# Patient Record
Sex: Female | Born: 1935 | Race: White | Hispanic: No | Smoking: Former smoker
Health system: Southern US, Community
[De-identification: ages and names within clinical notes are randomized; demographics above are authoritative.]

## PROBLEM LIST (undated history)

## (undated) VITALS — BP 105/41 | HR 64 | Temp 97.3°F | Resp 16 | Ht 61.0 in | Wt 144.4 lb

## (undated) DIAGNOSIS — R0902 Hypoxemia: Secondary | ICD-10-CM

## (undated) DIAGNOSIS — I70202 Unspecified atherosclerosis of native arteries of extremities, left leg: Secondary | ICD-10-CM

## (undated) DIAGNOSIS — N183 Chronic kidney disease, stage 3 unspecified: Secondary | ICD-10-CM

## (undated) DIAGNOSIS — I2510 Atherosclerotic heart disease of native coronary artery without angina pectoris: Secondary | ICD-10-CM

## (undated) DIAGNOSIS — N390 Urinary tract infection, site not specified: Secondary | ICD-10-CM

## (undated) DIAGNOSIS — I10 Essential (primary) hypertension: Secondary | ICD-10-CM

## (undated) DIAGNOSIS — J449 Chronic obstructive pulmonary disease, unspecified: Secondary | ICD-10-CM

## (undated) DIAGNOSIS — I739 Peripheral vascular disease, unspecified: Secondary | ICD-10-CM

## (undated) DIAGNOSIS — E785 Hyperlipidemia, unspecified: Secondary | ICD-10-CM

## (undated) DIAGNOSIS — I5022 Chronic systolic (congestive) heart failure: Secondary | ICD-10-CM

## (undated) DIAGNOSIS — B379 Candidiasis, unspecified: Secondary | ICD-10-CM

## (undated) DIAGNOSIS — I480 Paroxysmal atrial fibrillation: Secondary | ICD-10-CM

## (undated) DIAGNOSIS — E119 Type 2 diabetes mellitus without complications: Secondary | ICD-10-CM

## (undated) DIAGNOSIS — I219 Acute myocardial infarction, unspecified: Secondary | ICD-10-CM

## (undated) HISTORY — DX: Acute myocardial infarction, unspecified: I21.9

## (undated) HISTORY — PX: CORONARY ARTERY BYPASS GRAFT: SHX141

## (undated) HISTORY — PX: OTHER SURGICAL HISTORY: SHX169

## (undated) HISTORY — DX: Peripheral vascular disease, unspecified: I73.9

## (undated) HISTORY — PX: TOTAL KNEE ARTHROPLASTY: SHX125

## (undated) HISTORY — PX: CHOLECYSTECTOMY: SHX55

## (undated) HISTORY — DX: Paroxysmal atrial fibrillation: I48.0

## (undated) HISTORY — DX: Candidiasis, unspecified: B37.9

## (undated) HISTORY — PX: ANGIOPLASTY / STENTING FEMORAL: SUR30

## (undated) HISTORY — PX: CARDIAC CATHETERIZATION: SHX172

## (undated) HISTORY — DX: Chronic obstructive pulmonary disease, unspecified: J44.9

## (undated) HISTORY — PX: FEMUR SURGERY: SHX943

## (undated) HISTORY — PX: APPENDECTOMY: SHX54

## (undated) HISTORY — DX: Urinary tract infection, site not specified: N39.0

## (undated) HISTORY — PX: OVARY SURGERY: SHX727

## (undated) HISTORY — DX: Hyperlipidemia, unspecified: E78.5

## (undated) HISTORY — DX: Type 2 diabetes mellitus without complications: E11.9

## (undated) HISTORY — DX: Chronic systolic (congestive) heart failure: I50.22

## (undated) HISTORY — PX: ABDOMINAL HYSTERECTOMY: SHX81

---

## 1997-07-07 ENCOUNTER — Inpatient Hospital Stay (HOSPITAL_COMMUNITY): Admission: EM | Admit: 1997-07-07 | Discharge: 1997-07-15 | Payer: Self-pay | Admitting: Cardiovascular Disease

## 1997-08-17 ENCOUNTER — Encounter: Admission: RE | Admit: 1997-08-17 | Discharge: 1997-11-15 | Payer: Self-pay | Admitting: Anesthesiology

## 1997-09-26 ENCOUNTER — Emergency Department (HOSPITAL_COMMUNITY): Admission: EM | Admit: 1997-09-26 | Discharge: 1997-09-26 | Payer: Self-pay | Admitting: Emergency Medicine

## 1997-12-11 ENCOUNTER — Encounter: Payer: Self-pay | Admitting: Cardiovascular Disease

## 1997-12-11 ENCOUNTER — Observation Stay (HOSPITAL_COMMUNITY): Admission: RE | Admit: 1997-12-11 | Discharge: 1997-12-12 | Payer: Self-pay | Admitting: Cardiovascular Disease

## 1998-05-10 ENCOUNTER — Encounter: Admission: RE | Admit: 1998-05-10 | Discharge: 1998-08-08 | Payer: Self-pay | Admitting: Anesthesiology

## 1998-08-06 ENCOUNTER — Encounter: Payer: Self-pay | Admitting: Cardiovascular Disease

## 1998-08-06 ENCOUNTER — Ambulatory Visit: Admission: RE | Admit: 1998-08-06 | Discharge: 1998-08-06 | Payer: Self-pay | Admitting: Cardiovascular Disease

## 1998-09-04 ENCOUNTER — Encounter (INDEPENDENT_AMBULATORY_CARE_PROVIDER_SITE_OTHER): Payer: Self-pay | Admitting: Specialist

## 1998-09-05 ENCOUNTER — Inpatient Hospital Stay (HOSPITAL_COMMUNITY): Admission: EM | Admit: 1998-09-05 | Discharge: 1998-09-07 | Payer: Self-pay | Admitting: Cardiovascular Disease

## 1998-11-19 ENCOUNTER — Encounter: Admission: RE | Admit: 1998-11-19 | Discharge: 1999-02-17 | Payer: Self-pay | Admitting: Anesthesiology

## 1999-03-28 ENCOUNTER — Ambulatory Visit (HOSPITAL_COMMUNITY): Admission: RE | Admit: 1999-03-28 | Discharge: 1999-03-29 | Payer: Self-pay | Admitting: Cardiovascular Disease

## 1999-03-28 ENCOUNTER — Encounter: Payer: Self-pay | Admitting: Cardiovascular Disease

## 1999-04-09 ENCOUNTER — Inpatient Hospital Stay (HOSPITAL_COMMUNITY): Admission: EM | Admit: 1999-04-09 | Discharge: 1999-04-13 | Payer: Self-pay | Admitting: Emergency Medicine

## 1999-04-09 ENCOUNTER — Encounter: Payer: Self-pay | Admitting: Emergency Medicine

## 1999-04-12 ENCOUNTER — Encounter: Payer: Self-pay | Admitting: Cardiovascular Disease

## 1999-05-02 ENCOUNTER — Observation Stay (HOSPITAL_COMMUNITY): Admission: RE | Admit: 1999-05-02 | Discharge: 1999-05-03 | Payer: Self-pay | Admitting: Cardiovascular Disease

## 1999-06-14 ENCOUNTER — Encounter: Admission: RE | Admit: 1999-06-14 | Discharge: 1999-09-12 | Payer: Self-pay | Admitting: Anesthesiology

## 1999-12-27 ENCOUNTER — Encounter: Admission: RE | Admit: 1999-12-27 | Discharge: 2000-03-26 | Payer: Self-pay | Admitting: Anesthesiology

## 2000-04-02 ENCOUNTER — Encounter: Admission: RE | Admit: 2000-04-02 | Discharge: 2000-07-01 | Payer: Self-pay | Admitting: Anesthesiology

## 2000-05-16 ENCOUNTER — Ambulatory Visit (HOSPITAL_COMMUNITY): Admission: RE | Admit: 2000-05-16 | Discharge: 2000-05-16 | Payer: Self-pay

## 2000-06-25 ENCOUNTER — Encounter: Payer: Self-pay | Admitting: Specialist

## 2000-06-25 ENCOUNTER — Encounter: Admission: RE | Admit: 2000-06-25 | Discharge: 2000-06-25 | Payer: Self-pay | Admitting: Specialist

## 2000-09-07 ENCOUNTER — Encounter: Admission: RE | Admit: 2000-09-07 | Discharge: 2000-10-17 | Payer: Self-pay | Admitting: Anesthesiology

## 2001-11-25 ENCOUNTER — Inpatient Hospital Stay (HOSPITAL_COMMUNITY): Admission: EM | Admit: 2001-11-25 | Discharge: 2001-11-28 | Payer: Self-pay | Admitting: Emergency Medicine

## 2001-11-25 ENCOUNTER — Encounter: Payer: Self-pay | Admitting: Cardiovascular Disease

## 2001-11-25 ENCOUNTER — Encounter: Payer: Self-pay | Admitting: Emergency Medicine

## 2002-12-01 ENCOUNTER — Encounter: Payer: Self-pay | Admitting: Orthopedic Surgery

## 2002-12-07 ENCOUNTER — Inpatient Hospital Stay (HOSPITAL_COMMUNITY): Admission: RE | Admit: 2002-12-07 | Discharge: 2002-12-19 | Payer: Self-pay | Admitting: Orthopedic Surgery

## 2002-12-07 ENCOUNTER — Encounter: Payer: Self-pay | Admitting: Orthopedic Surgery

## 2002-12-10 ENCOUNTER — Encounter: Payer: Self-pay | Admitting: Orthopedic Surgery

## 2003-12-25 ENCOUNTER — Ambulatory Visit: Payer: Self-pay

## 2003-12-26 ENCOUNTER — Emergency Department: Payer: Self-pay | Admitting: Emergency Medicine

## 2004-01-02 ENCOUNTER — Ambulatory Visit: Payer: Self-pay | Admitting: General Surgery

## 2004-01-05 ENCOUNTER — Ambulatory Visit: Payer: Self-pay | Admitting: General Surgery

## 2004-02-05 ENCOUNTER — Ambulatory Visit: Payer: Self-pay | Admitting: Otolaryngology

## 2004-02-05 ENCOUNTER — Other Ambulatory Visit: Payer: Self-pay

## 2004-02-13 ENCOUNTER — Ambulatory Visit: Payer: Self-pay | Admitting: Otolaryngology

## 2004-02-23 ENCOUNTER — Ambulatory Visit: Payer: Self-pay | Admitting: Internal Medicine

## 2004-03-20 ENCOUNTER — Ambulatory Visit: Payer: Self-pay | Admitting: Internal Medicine

## 2004-04-17 ENCOUNTER — Ambulatory Visit: Payer: Self-pay | Admitting: Internal Medicine

## 2004-05-18 ENCOUNTER — Ambulatory Visit: Payer: Self-pay | Admitting: Internal Medicine

## 2004-08-22 ENCOUNTER — Ambulatory Visit: Payer: Self-pay | Admitting: Internal Medicine

## 2004-09-14 ENCOUNTER — Other Ambulatory Visit: Payer: Self-pay

## 2004-09-14 ENCOUNTER — Emergency Department: Payer: Self-pay | Admitting: Emergency Medicine

## 2004-09-17 ENCOUNTER — Ambulatory Visit: Payer: Self-pay | Admitting: Internal Medicine

## 2004-12-23 ENCOUNTER — Ambulatory Visit: Payer: Self-pay | Admitting: Internal Medicine

## 2004-12-31 ENCOUNTER — Ambulatory Visit: Payer: Self-pay

## 2005-01-17 ENCOUNTER — Ambulatory Visit: Payer: Self-pay | Admitting: Internal Medicine

## 2005-02-17 ENCOUNTER — Ambulatory Visit: Payer: Self-pay | Admitting: Internal Medicine

## 2005-05-21 ENCOUNTER — Ambulatory Visit: Payer: Self-pay | Admitting: Internal Medicine

## 2005-06-17 ENCOUNTER — Ambulatory Visit: Payer: Self-pay | Admitting: Internal Medicine

## 2005-07-29 ENCOUNTER — Ambulatory Visit: Payer: Self-pay | Admitting: Internal Medicine

## 2005-08-17 ENCOUNTER — Ambulatory Visit: Payer: Self-pay | Admitting: Internal Medicine

## 2005-10-26 ENCOUNTER — Emergency Department: Payer: Self-pay | Admitting: Internal Medicine

## 2005-11-17 ENCOUNTER — Ambulatory Visit: Payer: Self-pay | Admitting: Internal Medicine

## 2005-12-05 ENCOUNTER — Inpatient Hospital Stay (HOSPITAL_COMMUNITY): Admission: EM | Admit: 2005-12-05 | Discharge: 2005-12-09 | Payer: Self-pay | Admitting: Cardiology

## 2005-12-05 ENCOUNTER — Emergency Department: Payer: Self-pay | Admitting: Emergency Medicine

## 2005-12-18 ENCOUNTER — Ambulatory Visit: Payer: Self-pay | Admitting: Internal Medicine

## 2006-02-19 ENCOUNTER — Ambulatory Visit (HOSPITAL_COMMUNITY): Admission: RE | Admit: 2006-02-19 | Discharge: 2006-02-19 | Payer: Self-pay | Admitting: Orthopedic Surgery

## 2006-03-26 ENCOUNTER — Ambulatory Visit: Payer: Self-pay

## 2006-05-19 ENCOUNTER — Ambulatory Visit: Payer: Self-pay | Admitting: Internal Medicine

## 2006-06-18 ENCOUNTER — Ambulatory Visit: Payer: Self-pay | Admitting: Internal Medicine

## 2006-06-22 ENCOUNTER — Ambulatory Visit: Payer: Self-pay | Admitting: Internal Medicine

## 2006-07-15 ENCOUNTER — Inpatient Hospital Stay (HOSPITAL_COMMUNITY): Admission: RE | Admit: 2006-07-15 | Discharge: 2006-07-20 | Payer: Self-pay | Admitting: Orthopedic Surgery

## 2006-07-19 ENCOUNTER — Ambulatory Visit: Payer: Self-pay | Admitting: Internal Medicine

## 2006-08-10 ENCOUNTER — Encounter: Payer: Self-pay | Admitting: Orthopedic Surgery

## 2006-08-18 ENCOUNTER — Encounter: Payer: Self-pay | Admitting: Orthopedic Surgery

## 2006-09-18 ENCOUNTER — Encounter: Payer: Self-pay | Admitting: Orthopedic Surgery

## 2006-10-07 ENCOUNTER — Ambulatory Visit (HOSPITAL_BASED_OUTPATIENT_CLINIC_OR_DEPARTMENT_OTHER): Admission: RE | Admit: 2006-10-07 | Discharge: 2006-10-07 | Payer: Self-pay | Admitting: Orthopedic Surgery

## 2006-10-19 ENCOUNTER — Encounter: Payer: Self-pay | Admitting: Orthopedic Surgery

## 2006-11-18 ENCOUNTER — Encounter: Payer: Self-pay | Admitting: Orthopedic Surgery

## 2006-11-18 ENCOUNTER — Ambulatory Visit: Payer: Self-pay | Admitting: Internal Medicine

## 2006-11-23 ENCOUNTER — Ambulatory Visit: Payer: Self-pay | Admitting: Internal Medicine

## 2006-11-24 ENCOUNTER — Emergency Department: Payer: Self-pay | Admitting: Emergency Medicine

## 2006-12-19 ENCOUNTER — Ambulatory Visit: Payer: Self-pay | Admitting: Internal Medicine

## 2007-02-21 ENCOUNTER — Inpatient Hospital Stay (HOSPITAL_COMMUNITY): Admission: EM | Admit: 2007-02-21 | Discharge: 2007-02-24 | Payer: Self-pay | Admitting: Emergency Medicine

## 2007-03-30 ENCOUNTER — Other Ambulatory Visit: Payer: Self-pay

## 2007-03-31 ENCOUNTER — Inpatient Hospital Stay: Payer: Self-pay | Admitting: Internal Medicine

## 2007-04-24 ENCOUNTER — Emergency Department (HOSPITAL_COMMUNITY): Admission: EM | Admit: 2007-04-24 | Discharge: 2007-04-24 | Payer: Self-pay | Admitting: Emergency Medicine

## 2007-05-19 ENCOUNTER — Ambulatory Visit: Payer: Self-pay | Admitting: Internal Medicine

## 2007-06-18 ENCOUNTER — Ambulatory Visit: Payer: Self-pay | Admitting: Internal Medicine

## 2007-09-08 ENCOUNTER — Inpatient Hospital Stay (HOSPITAL_COMMUNITY): Admission: EM | Admit: 2007-09-08 | Discharge: 2007-09-14 | Payer: Self-pay | Admitting: Emergency Medicine

## 2007-09-18 ENCOUNTER — Ambulatory Visit: Payer: Self-pay | Admitting: Internal Medicine

## 2007-10-05 ENCOUNTER — Ambulatory Visit: Payer: Self-pay | Admitting: Internal Medicine

## 2007-10-13 ENCOUNTER — Inpatient Hospital Stay (HOSPITAL_COMMUNITY): Admission: EM | Admit: 2007-10-13 | Discharge: 2007-10-16 | Payer: Self-pay | Admitting: Emergency Medicine

## 2007-10-19 ENCOUNTER — Ambulatory Visit: Payer: Self-pay | Admitting: Internal Medicine

## 2008-03-20 ENCOUNTER — Ambulatory Visit: Payer: Self-pay | Admitting: Internal Medicine

## 2008-04-03 ENCOUNTER — Ambulatory Visit: Payer: Self-pay | Admitting: Internal Medicine

## 2008-04-05 ENCOUNTER — Inpatient Hospital Stay (HOSPITAL_COMMUNITY): Admission: EM | Admit: 2008-04-05 | Discharge: 2008-04-10 | Payer: Self-pay | Admitting: Emergency Medicine

## 2008-04-17 ENCOUNTER — Ambulatory Visit: Payer: Self-pay | Admitting: Internal Medicine

## 2008-05-11 ENCOUNTER — Ambulatory Visit: Payer: Self-pay

## 2008-05-18 ENCOUNTER — Ambulatory Visit: Payer: Self-pay | Admitting: Internal Medicine

## 2008-06-05 ENCOUNTER — Inpatient Hospital Stay (HOSPITAL_COMMUNITY): Admission: RE | Admit: 2008-06-05 | Discharge: 2008-06-08 | Payer: Self-pay | Admitting: Cardiovascular Disease

## 2008-08-08 ENCOUNTER — Emergency Department: Payer: Self-pay | Admitting: Emergency Medicine

## 2008-08-15 ENCOUNTER — Emergency Department (HOSPITAL_COMMUNITY): Admission: EM | Admit: 2008-08-15 | Discharge: 2008-08-15 | Payer: Self-pay | Admitting: Emergency Medicine

## 2008-11-09 ENCOUNTER — Ambulatory Visit: Payer: Self-pay | Admitting: Vascular Surgery

## 2008-12-02 ENCOUNTER — Ambulatory Visit: Payer: Self-pay | Admitting: Family Medicine

## 2008-12-02 ENCOUNTER — Inpatient Hospital Stay (HOSPITAL_COMMUNITY): Admission: EM | Admit: 2008-12-02 | Discharge: 2008-12-07 | Payer: Self-pay | Admitting: Emergency Medicine

## 2009-03-01 ENCOUNTER — Ambulatory Visit: Payer: Self-pay | Admitting: Vascular Surgery

## 2009-05-21 ENCOUNTER — Encounter: Admission: RE | Admit: 2009-05-21 | Discharge: 2009-05-21 | Payer: Self-pay | Admitting: Cardiovascular Disease

## 2009-05-24 ENCOUNTER — Inpatient Hospital Stay (HOSPITAL_COMMUNITY): Admission: RE | Admit: 2009-05-24 | Discharge: 2009-05-25 | Payer: Self-pay | Admitting: Cardiovascular Disease

## 2009-06-17 ENCOUNTER — Ambulatory Visit: Payer: Self-pay | Admitting: Internal Medicine

## 2009-06-18 ENCOUNTER — Ambulatory Visit: Payer: Self-pay | Admitting: Internal Medicine

## 2009-07-18 ENCOUNTER — Ambulatory Visit: Payer: Self-pay | Admitting: Internal Medicine

## 2009-07-29 ENCOUNTER — Emergency Department: Payer: Self-pay | Admitting: Emergency Medicine

## 2010-01-14 ENCOUNTER — Encounter: Admission: RE | Admit: 2010-01-14 | Discharge: 2010-01-14 | Payer: Self-pay | Admitting: Cardiovascular Disease

## 2010-01-22 ENCOUNTER — Ambulatory Visit (HOSPITAL_COMMUNITY)
Admission: RE | Admit: 2010-01-22 | Discharge: 2010-01-23 | Payer: Self-pay | Source: Home / Self Care | Attending: Cardiovascular Disease | Admitting: Cardiovascular Disease

## 2010-03-06 ENCOUNTER — Ambulatory Visit
Admission: RE | Admit: 2010-03-06 | Discharge: 2010-03-06 | Payer: Self-pay | Source: Home / Self Care | Attending: Vascular Surgery | Admitting: Vascular Surgery

## 2010-04-30 LAB — URINE MICROSCOPIC-ADD ON

## 2010-04-30 LAB — CBC
HCT: 33.5 % — ABNORMAL LOW (ref 36.0–46.0)
MCH: 32.7 pg (ref 26.0–34.0)
MCV: 94.4 fL (ref 78.0–100.0)
Platelets: 257 10*3/uL (ref 150–400)
RBC: 3.55 MIL/uL — ABNORMAL LOW (ref 3.87–5.11)
WBC: 10.1 10*3/uL (ref 4.0–10.5)

## 2010-04-30 LAB — URINALYSIS, ROUTINE W REFLEX MICROSCOPIC
Bilirubin Urine: NEGATIVE
Hgb urine dipstick: NEGATIVE
Leukocytes, UA: NEGATIVE
pH: 6 (ref 5.0–8.0)

## 2010-04-30 LAB — GLUCOSE, CAPILLARY
Glucose-Capillary: 254 mg/dL — ABNORMAL HIGH (ref 70–99)
Glucose-Capillary: 257 mg/dL — ABNORMAL HIGH (ref 70–99)
Glucose-Capillary: 353 mg/dL — ABNORMAL HIGH (ref 70–99)

## 2010-04-30 LAB — BASIC METABOLIC PANEL
CO2: 26 mEq/L (ref 19–32)
Calcium: 9.3 mg/dL (ref 8.4–10.5)
Chloride: 103 mEq/L (ref 96–112)
Creatinine, Ser: 1.17 mg/dL (ref 0.4–1.2)
GFR calc Af Amer: 54 mL/min — ABNORMAL LOW (ref >60)
GFR calc non Af Amer: 45 mL/min — ABNORMAL LOW (ref >60)

## 2010-05-08 LAB — CBC
HCT: 32.9 % — ABNORMAL LOW (ref 36.0–46.0)
MCHC: 34.3 g/dL (ref 30.0–36.0)
MCV: 98.7 fL (ref 78.0–100.0)
Platelets: 282 10*3/uL (ref 150–400)
RDW: 15.7 % — ABNORMAL HIGH (ref 11.5–15.5)

## 2010-05-08 LAB — BASIC METABOLIC PANEL
BUN: 15 mg/dL (ref 6–23)
CO2: 25 mEq/L (ref 19–32)
Chloride: 100 mEq/L (ref 96–112)
Creatinine, Ser: 1.14 mg/dL (ref 0.4–1.2)
Glucose, Bld: 280 mg/dL — ABNORMAL HIGH (ref 70–99)
Potassium: 3.8 mEq/L (ref 3.5–5.1)

## 2010-05-08 LAB — GLUCOSE, CAPILLARY
Glucose-Capillary: 138 mg/dL — ABNORMAL HIGH (ref 70–99)
Glucose-Capillary: 225 mg/dL — ABNORMAL HIGH (ref 70–99)
Glucose-Capillary: 91 mg/dL (ref 70–99)

## 2010-05-23 LAB — COMPREHENSIVE METABOLIC PANEL
Albumin: 3.9 g/dL (ref 3.5–5.2)
BUN: 12 mg/dL (ref 6–23)
Calcium: 9.2 mg/dL (ref 8.4–10.5)
Glucose, Bld: 227 mg/dL — ABNORMAL HIGH (ref 70–99)
Sodium: 138 mEq/L (ref 135–145)
Total Protein: 6.9 g/dL (ref 6.0–8.3)

## 2010-05-23 LAB — CARDIAC PANEL(CRET KIN+CKTOT+MB+TROPI)
CK, MB: 1.8 ng/mL (ref 0.3–4.0)
CK, MB: 1.9 ng/mL (ref 0.3–4.0)
CK, MB: 1.9 ng/mL (ref 0.3–4.0)
Relative Index: INVALID (ref 0.0–2.5)
Relative Index: INVALID (ref 0.0–2.5)
Total CK: 64 U/L (ref 7–177)
Total CK: 68 U/L (ref 7–177)
Troponin I: 0.02 ng/mL (ref 0.00–0.06)
Troponin I: 0.03 ng/mL (ref 0.00–0.06)

## 2010-05-23 LAB — POCT I-STAT, CHEM 8
HCT: 41 % (ref 36.0–46.0)
Hemoglobin: 13.9 g/dL (ref 12.0–15.0)
Potassium: 4 mEq/L (ref 3.5–5.1)
Sodium: 138 mEq/L (ref 135–145)

## 2010-05-23 LAB — DIFFERENTIAL
Basophils Absolute: 0 10*3/uL (ref 0.0–0.1)
Basophils Relative: 1 % (ref 0–1)
Eosinophils Relative: 1 % (ref 0–5)
Lymphocytes Relative: 24 % (ref 12–46)
Lymphocytes Relative: 7 % — ABNORMAL LOW (ref 12–46)
Neutro Abs: 8.3 10*3/uL — ABNORMAL HIGH (ref 1.7–7.7)
Neutrophils Relative %: 67 % (ref 43–77)
Neutrophils Relative %: 92 % — ABNORMAL HIGH (ref 43–77)

## 2010-05-23 LAB — GLUCOSE, CAPILLARY
Glucose-Capillary: 101 mg/dL — ABNORMAL HIGH (ref 70–99)
Glucose-Capillary: 102 mg/dL — ABNORMAL HIGH (ref 70–99)
Glucose-Capillary: 104 mg/dL — ABNORMAL HIGH (ref 70–99)
Glucose-Capillary: 111 mg/dL — ABNORMAL HIGH (ref 70–99)
Glucose-Capillary: 213 mg/dL — ABNORMAL HIGH (ref 70–99)
Glucose-Capillary: 214 mg/dL — ABNORMAL HIGH (ref 70–99)
Glucose-Capillary: 268 mg/dL — ABNORMAL HIGH (ref 70–99)
Glucose-Capillary: 269 mg/dL — ABNORMAL HIGH (ref 70–99)
Glucose-Capillary: 55 mg/dL — ABNORMAL LOW (ref 70–99)
Glucose-Capillary: 58 mg/dL — ABNORMAL LOW (ref 70–99)
Glucose-Capillary: 87 mg/dL (ref 70–99)

## 2010-05-23 LAB — BASIC METABOLIC PANEL
BUN: 29 mg/dL — ABNORMAL HIGH (ref 6–23)
BUN: 33 mg/dL — ABNORMAL HIGH (ref 6–23)
CO2: 26 mEq/L (ref 19–32)
CO2: 27 mEq/L (ref 19–32)
CO2: 29 mEq/L (ref 19–32)
Calcium: 9.1 mg/dL (ref 8.4–10.5)
Calcium: 9.2 mg/dL (ref 8.4–10.5)
Calcium: 9.6 mg/dL (ref 8.4–10.5)
Calcium: 9.8 mg/dL (ref 8.4–10.5)
Chloride: 102 mEq/L (ref 96–112)
Chloride: 104 mEq/L (ref 96–112)
Chloride: 99 mEq/L (ref 96–112)
Creatinine, Ser: 1.05 mg/dL (ref 0.4–1.2)
Creatinine, Ser: 1.48 mg/dL — ABNORMAL HIGH (ref 0.4–1.2)
GFR calc Af Amer: 42 mL/min — ABNORMAL LOW (ref >60)
GFR calc Af Amer: 45 mL/min — ABNORMAL LOW (ref >60)
GFR calc Af Amer: 51 mL/min — ABNORMAL LOW (ref >60)
GFR calc Af Amer: >60 mL/min (ref >60)
GFR calc non Af Amer: 32 mL/min — ABNORMAL LOW (ref >60)
GFR calc non Af Amer: 34 mL/min — ABNORMAL LOW (ref >60)
GFR calc non Af Amer: 37 mL/min — ABNORMAL LOW (ref >60)
GFR calc non Af Amer: 43 mL/min — ABNORMAL LOW (ref >60)
Glucose, Bld: 183 mg/dL — ABNORMAL HIGH (ref 70–99)
Glucose, Bld: 214 mg/dL — ABNORMAL HIGH (ref 70–99)
Potassium: 4.3 mEq/L (ref 3.5–5.1)
Potassium: 4.3 mEq/L (ref 3.5–5.1)
Sodium: 139 mEq/L (ref 135–145)
Sodium: 141 mEq/L (ref 135–145)
Sodium: 142 mEq/L (ref 135–145)

## 2010-05-23 LAB — CBC
HCT: 33.7 % — ABNORMAL LOW (ref 36.0–46.0)
HCT: 37.3 % (ref 36.0–46.0)
Hemoglobin: 11.9 g/dL — ABNORMAL LOW (ref 12.0–15.0)
Hemoglobin: 13 g/dL (ref 12.0–15.0)
MCHC: 34.8 g/dL (ref 30.0–36.0)
MCHC: 34.8 g/dL (ref 30.0–36.0)
MCV: 98.9 fL (ref 78.0–100.0)
MCV: 98.9 fL (ref 78.0–100.0)
Platelets: 327 10*3/uL (ref 150–400)
Platelets: 332 10*3/uL (ref 150–400)
Platelets: 378 10*3/uL (ref 150–400)
Platelets: 388 10*3/uL (ref 150–400)
RBC: 3.43 MIL/uL — ABNORMAL LOW (ref 3.87–5.11)
RDW: 14.3 % (ref 11.5–15.5)
RDW: 14.4 % (ref 11.5–15.5)
RDW: 14.9 % (ref 11.5–15.5)
WBC: 10.3 10*3/uL (ref 4.0–10.5)
WBC: 9.5 10*3/uL (ref 4.0–10.5)

## 2010-05-23 LAB — POCT CARDIAC MARKERS
CKMB, poc: 2 ng/mL (ref 1.0–8.0)
Myoglobin, poc: 137 ng/mL (ref 12–200)

## 2010-05-23 LAB — BRAIN NATRIURETIC PEPTIDE: Pro B Natriuretic peptide (BNP): 229 pg/mL — ABNORMAL HIGH (ref 0.0–100.0)

## 2010-05-23 LAB — URINE CULTURE: Culture: NO GROWTH

## 2010-05-23 LAB — URINE MICROSCOPIC-ADD ON

## 2010-05-23 LAB — URINALYSIS, ROUTINE W REFLEX MICROSCOPIC
Bilirubin Urine: NEGATIVE
Glucose, UA: 500 mg/dL — AB
Hgb urine dipstick: NEGATIVE
Protein, ur: 30 mg/dL — AB
Urobilinogen, UA: 1 mg/dL (ref 0.0–1.0)

## 2010-05-23 LAB — LIPID PANEL
Cholesterol: 222 mg/dL — ABNORMAL HIGH (ref 0–200)
LDL Cholesterol: 150 mg/dL — ABNORMAL HIGH (ref 0–99)

## 2010-05-23 LAB — HEMOGLOBIN A1C: Hgb A1c MFr Bld: 7.7 % — ABNORMAL HIGH (ref 4.6–6.1)

## 2010-05-23 LAB — CK TOTAL AND CKMB (NOT AT ARMC): CK, MB: 1.7 ng/mL (ref 0.3–4.0)

## 2010-05-29 LAB — TYPE AND SCREEN
Donor AG Type: NEGATIVE
Donor AG Type: NEGATIVE
Donor AG Type: NEGATIVE

## 2010-05-29 LAB — BASIC METABOLIC PANEL
BUN: 25 mg/dL — ABNORMAL HIGH (ref 6–23)
BUN: 27 mg/dL — ABNORMAL HIGH (ref 6–23)
CO2: 30 mEq/L (ref 19–32)
CO2: 32 mEq/L (ref 19–32)
Chloride: 100 mEq/L (ref 96–112)
Chloride: 101 mEq/L (ref 96–112)
Chloride: 104 mEq/L (ref 96–112)
GFR calc Af Amer: 50 mL/min — ABNORMAL LOW (ref >60)
GFR calc Af Amer: 58 mL/min — ABNORMAL LOW (ref >60)
GFR calc non Af Amer: 48 mL/min — ABNORMAL LOW (ref >60)
Glucose, Bld: 159 mg/dL — ABNORMAL HIGH (ref 70–99)
Glucose, Bld: 223 mg/dL — ABNORMAL HIGH (ref 70–99)
Potassium: 4 mEq/L (ref 3.5–5.1)
Potassium: 4.2 mEq/L (ref 3.5–5.1)
Potassium: 4.3 mEq/L (ref 3.5–5.1)
Sodium: 141 mEq/L (ref 135–145)
Sodium: 142 mEq/L (ref 135–145)

## 2010-05-29 LAB — GLUCOSE, CAPILLARY
Glucose-Capillary: 203 mg/dL — ABNORMAL HIGH (ref 70–99)
Glucose-Capillary: 234 mg/dL — ABNORMAL HIGH (ref 70–99)
Glucose-Capillary: 256 mg/dL — ABNORMAL HIGH (ref 70–99)
Glucose-Capillary: 81 mg/dL (ref 70–99)
Glucose-Capillary: 93 mg/dL (ref 70–99)

## 2010-05-29 LAB — COMPREHENSIVE METABOLIC PANEL
Albumin: 3.1 g/dL — ABNORMAL LOW (ref 3.5–5.2)
Alkaline Phosphatase: 39 U/L (ref 39–117)
BUN: 20 mg/dL (ref 6–23)
Chloride: 106 mEq/L (ref 96–112)
Potassium: 3.8 mEq/L (ref 3.5–5.1)
Total Bilirubin: 0.5 mg/dL (ref 0.3–1.2)

## 2010-05-29 LAB — CBC
HCT: 27.7 % — ABNORMAL LOW (ref 36.0–46.0)
HCT: 28.4 % — ABNORMAL LOW (ref 36.0–46.0)
HCT: 30.3 % — ABNORMAL LOW (ref 36.0–46.0)
HCT: 31.9 % — ABNORMAL LOW (ref 36.0–46.0)
Hemoglobin: 10.4 g/dL — ABNORMAL LOW (ref 12.0–15.0)
Hemoglobin: 11.2 g/dL — ABNORMAL LOW (ref 12.0–15.0)
Hemoglobin: 9.9 g/dL — ABNORMAL LOW (ref 12.0–15.0)
MCHC: 34.5 g/dL (ref 30.0–36.0)
MCHC: 35 g/dL (ref 30.0–36.0)
MCV: 97.7 fL (ref 78.0–100.0)
MCV: 97.8 fL (ref 78.0–100.0)
Platelets: 180 10*3/uL (ref 150–400)
Platelets: 185 10*3/uL (ref 150–400)
Platelets: 195 10*3/uL (ref 150–400)
RBC: 3.1 MIL/uL — ABNORMAL LOW (ref 3.87–5.11)
RBC: 3.3 MIL/uL — ABNORMAL LOW (ref 3.87–5.11)
RDW: 16 % — ABNORMAL HIGH (ref 11.5–15.5)
RDW: 16.2 % — ABNORMAL HIGH (ref 11.5–15.5)
WBC: 5.9 10*3/uL (ref 4.0–10.5)
WBC: 7.3 10*3/uL (ref 4.0–10.5)

## 2010-06-04 LAB — DIFFERENTIAL
Basophils Relative: 0 % (ref 0–1)
Eosinophils Absolute: 0.3 10*3/uL (ref 0.0–0.7)
Eosinophils Relative: 5 % (ref 0–5)
Monocytes Absolute: 0.7 10*3/uL (ref 0.1–1.0)
Monocytes Relative: 12 % (ref 3–12)
Neutrophils Relative %: 57 % (ref 43–77)

## 2010-06-04 LAB — BASIC METABOLIC PANEL
BUN: 37 mg/dL — ABNORMAL HIGH (ref 6–23)
CO2: 25 mEq/L (ref 19–32)
Chloride: 100 mEq/L (ref 96–112)
Chloride: 104 mEq/L (ref 96–112)
Chloride: 104 mEq/L (ref 96–112)
Creatinine, Ser: 1.08 mg/dL (ref 0.4–1.2)
GFR calc Af Amer: 52 mL/min — ABNORMAL LOW (ref >60)
GFR calc Af Amer: >60 mL/min (ref >60)
GFR calc non Af Amer: 33 mL/min — ABNORMAL LOW (ref >60)
GFR calc non Af Amer: 43 mL/min — ABNORMAL LOW (ref >60)
Glucose, Bld: 112 mg/dL — ABNORMAL HIGH (ref 70–99)
Potassium: 3.7 mEq/L (ref 3.5–5.1)
Potassium: 4.1 mEq/L (ref 3.5–5.1)
Potassium: 4.8 mEq/L (ref 3.5–5.1)
Sodium: 137 mEq/L (ref 135–145)

## 2010-06-04 LAB — COMPREHENSIVE METABOLIC PANEL
ALT: 31 U/L (ref 0–35)
Albumin: 3.2 g/dL — ABNORMAL LOW (ref 3.5–5.2)
Alkaline Phosphatase: 41 U/L (ref 39–117)
BUN: 15 mg/dL (ref 6–23)
Calcium: 9 mg/dL (ref 8.4–10.5)
Potassium: 3.7 mEq/L (ref 3.5–5.1)
Sodium: 140 mEq/L (ref 135–145)
Total Protein: 5.9 g/dL — ABNORMAL LOW (ref 6.0–8.3)

## 2010-06-04 LAB — CBC
HCT: 34.5 % — ABNORMAL LOW (ref 36.0–46.0)
HCT: 38.1 % (ref 36.0–46.0)
Hemoglobin: 12.1 g/dL (ref 12.0–15.0)
Hemoglobin: 13.3 g/dL (ref 12.0–15.0)
MCHC: 35 g/dL (ref 30.0–36.0)
MCV: 93.5 fL (ref 78.0–100.0)
MCV: 93.6 fL (ref 78.0–100.0)
MCV: 94.2 fL (ref 78.0–100.0)
Platelets: 275 10*3/uL (ref 150–400)
RBC: 3.69 MIL/uL — ABNORMAL LOW (ref 3.87–5.11)
RBC: 4.07 MIL/uL (ref 3.87–5.11)
RDW: 15.1 % (ref 11.5–15.5)
WBC: 5.4 10*3/uL (ref 4.0–10.5)
WBC: 5.5 10*3/uL (ref 4.0–10.5)

## 2010-06-04 LAB — URINALYSIS, ROUTINE W REFLEX MICROSCOPIC
Hgb urine dipstick: NEGATIVE
Ketones, ur: NEGATIVE mg/dL
Protein, ur: NEGATIVE mg/dL
Urobilinogen, UA: 0.2 mg/dL (ref 0.0–1.0)

## 2010-06-04 LAB — POCT I-STAT, CHEM 8
Calcium, Ion: 1.22 mmol/L (ref 1.12–1.32)
Glucose, Bld: 127 mg/dL — ABNORMAL HIGH (ref 70–99)
HCT: 38 % (ref 36.0–46.0)
Hemoglobin: 12.9 g/dL (ref 12.0–15.0)

## 2010-06-04 LAB — GLUCOSE, CAPILLARY
Glucose-Capillary: 116 mg/dL — ABNORMAL HIGH (ref 70–99)
Glucose-Capillary: 141 mg/dL — ABNORMAL HIGH (ref 70–99)
Glucose-Capillary: 154 mg/dL — ABNORMAL HIGH (ref 70–99)
Glucose-Capillary: 169 mg/dL — ABNORMAL HIGH (ref 70–99)
Glucose-Capillary: 173 mg/dL — ABNORMAL HIGH (ref 70–99)
Glucose-Capillary: 191 mg/dL — ABNORMAL HIGH (ref 70–99)
Glucose-Capillary: 51 mg/dL — ABNORMAL LOW (ref 70–99)
Glucose-Capillary: 64 mg/dL — ABNORMAL LOW (ref 70–99)
Glucose-Capillary: 99 mg/dL (ref 70–99)

## 2010-06-04 LAB — CARDIAC PANEL(CRET KIN+CKTOT+MB+TROPI)
CK, MB: 2 ng/mL (ref 0.3–4.0)
CK, MB: 2.3 ng/mL (ref 0.3–4.0)
Relative Index: INVALID (ref 0.0–2.5)
Total CK: 43 U/L (ref 7–177)
Total CK: 44 U/L (ref 7–177)
Troponin I: 0.01 ng/mL (ref 0.00–0.06)

## 2010-06-04 LAB — PROTIME-INR
INR: 1.2 (ref 0.00–1.49)
Prothrombin Time: 15.3 seconds — ABNORMAL HIGH (ref 11.6–15.2)

## 2010-06-04 LAB — BRAIN NATRIURETIC PEPTIDE
Pro B Natriuretic peptide (BNP): 144 pg/mL — ABNORMAL HIGH (ref 0.0–100.0)
Pro B Natriuretic peptide (BNP): 220 pg/mL — ABNORMAL HIGH (ref 0.0–100.0)

## 2010-06-04 LAB — TSH: TSH: 2.938 u[IU]/mL (ref 0.350–4.500)

## 2010-06-04 LAB — URINE CULTURE

## 2010-06-04 LAB — HEMOGLOBIN A1C: Mean Plasma Glucose: 183 mg/dL

## 2010-07-02 NOTE — Discharge Summary (Signed)
[name]  [name], STOFFER            ACCOUNT NO.:  [id number]   MEDICAL RECORD NO.:  [id number]          PATIENT TYPE:  INP   LOCATION:                               FACILITY:  [facility]   PHYSICIAN:  Antonieta Iba, MD   DATE OF BIRTH:  [birth year]   DATE OF ADMISSION:  10/13/2007  DATE OF DISCHARGE:  10/16/2007                               DISCHARGE SUMMARY   DISCHARGE DIAGNOSES:  1. Non-ST elevation myocardial infarction.  2. Atrial fibrillation with rapid ventricular response tachycardia,      now resolved, in sinus rhythm.  3. Coronary disease with bypass grafting, last cath in July 2009      without change and was stable.  4. Ischemic cardiomyopathy, ejection fraction 45-50%.  5. Chronic systolic heart failure,stable.  6. Hypertension.  7. Dyslipidemia.  8. Insulin-dependent diabetes mellitus.   DISCHARGE CONDITION:  Stable and improved.   PROCEDURES:  None.   DISCHARGE MEDICATIONS:  1. Metformin 500 mg twice a day.  2. Metoprolol.  We decreased to 75 mg twice a day.  She has 50 mg      tablets now, 1-1/2 tablets twice a day.  3. Niaspan 500 mg nightly.  4. Potassium 10 mEq daily.  5. Imdur.  She had been taking a 60 mg tablet and a 30 mg tablet      daily.  She can continue that until complete and then I have      written a prescription for Imdur 60 mg 1-1/2 tablets daily.  6. Diltiazem 300 mg has been changed to a decrease of diltiazem 240 mg      1 daily with new prescription.  7. Gabapentin 400 mg 3 times a day.  8. Furosemide 40 mg daily.  9. Ranexa 500 mg twice a day.  10.Pravachol 40 mg every evening.  11.Benazepril 10 mg daily.  12.TriCor 145 mg daily.  13.Plavix 75 mg daily.  14.Humulin N 50 units in the morning and Humulin N 45 units in the      evening.  15.Nitroglycerin 1/150th as needed for chest pain as before, and we      added Amiodarone 200 mg 1 twice a day with prescription as well.   DISCHARGE INSTRUCTIONS:  1. Activity as tolerated.  Increase  activity slowly.  Low-sodium heart-      healthy diabetic diet.  2. Follow up with Dr. Allyson [name] on October 28, 2007, at 3:00 p.m. and      then the patient states she usually sees him in [place], so she      will contact the office for appointment in [place] instead.   HISTORY OF PRESENT ILLNESS:  A 77 year old female patient of Dr. Allyson [name]  with known coronary disease, presented to the emergency room with chest  pain on October 13, 2007.  She had just been in the hospital in July 2009  with chest pain, but this pain was actually worse than that in July  visit.  She was cathed on September 09, 2007, and it was the same as her cath  in January 2009.  She has total saphenous vein  graft to the RCA, a  patent LIMA to her LAD, and patent saphenous vein graft to her OM.  Medical therapy was recommended.  The patient's troponin in July, 2009,  was elevated but not CK-MBs, and at the discharge, her Ranexa, Imdur,  and metoprolol were all increased.   Until October 12, 2007, she had done well, and then she had several  episodes of chest pain at 2 and at 4 a.m. and relieved with  nitroglycerin.  Though her diet is not the best, she was eating at  [employer] , came home and had more chest pain, took 5 nitroglycerin  within an hour's time, no relief, came to the emergency room.  She was  found to be in atrial fib with rapid ventricular response.  She was  given 10 mg IV bolus of diltiazem and started her on IV diltiazem drip  at 5 mg an hour.  Heart rate went down to 114 when she would have  episodic chest pain.  We increased her IV Diltiazem and dropped the  heart rate in the 90s and her chest pain decreased, just had chest  heaviness, and also IV heparin was started.  She was then brought in.   For more complete past medical history, please see H&P.   The patient's admitting enzymes were positive.  Dr. Allyson [name] saw her and  felt he would not recath her at this point since she just had the cath  and  there were no changes.  She continued to do better.  She was started  on amiodarone.  No Coumadin at this point because the atrial fib was a  brief episode.  The patient was in AFib from an admission 10/13/2007, to  conversion at 4 in the morning when she converted with some asystole and  bradycardia and then it remained in sinus rhythm since that time.   The patient was transferred from ICU to the floor, ambulated, and did  well.   She continued to improve and by the morning of October 16, 2007, she was  ambulating without problems, felt well, and was ready for discharge  home.  She does know to contact us for more chest pain.  She will see  Dr. Allyson [name] in 2 weeks.   EXAMINATION AT DISCHARGE:  Blood pressure 124/59, pulse sinus rhythm at  68, sats 95% on room air, and afebrile.   LABORATORY DATA:  On admission, hemoglobin 11.2 and hematocrit 32.2.  These remained stable.  At discharge, she was 11.5, hematocrit 33.8, WBC  5.6, and platelets 257.  Chemistry on admission, sodium 141, potassium  4.4, chloride 106, CO2 28, BUN 12, creatinine 0.85, and glucose 124 and  at discharge, she was stable without change.  Coags on admission,  protime 14.6, INR of 1.1, and PTT greater than 200 on heparin and  discharged on __________ because heparin had been discontinued.  Cardiac  enzymes, CK 164 and 99, MB is positive 9.3, 18.1, and 11.9, and troponin  I peaked at 3.34.  On October 14, 2007, it came down to 1.84.   Magnesium 1.8, calcium 9.9, and TSH was pending at discharge.  Previously, it had been 0.95, which was slightly down.  Glycohemoglobin  was 6.9.  D-dimer was 0.90.  An initial TSH was 8.511 and it has been  different every time it was checked.   RADIOLOGY:  Cardiomegaly and pulmonary vascular congestion.  She was  given IV Lasix.   Also D-dimer was 0.90.   EKG  on admission, atrial fib with rapid ventricular response, rate 135,  marked ST abnormality, and possible inferior  subendocardial injury.  Follow up on October 14, 2007, sinus bradycardia and septal infarct, age  undetermined, consider lateral ischemia.  In a couple of hours after  that, in sinus rhythm and nonspecific ST changes.  Again, the patient  will follow up as an outpatient with Dr. Allyson [name].      Darcella Gasman. Annie Paras, N.P.      Antonieta Iba, MD  Electronically Signed    LRI/MEDQ  D:  10/16/2007  T:  10/17/2007  Job:  [id number]   cc:   Nanetta Batty, M.D.  [place] Sender

## 2010-07-02 NOTE — Discharge Summary (Signed)
[name]  [name], [name]            ACCOUNT NO.:  [id number]   MEDICAL RECORD NO.:  [id number]          PATIENT TYPE:  INP   LOCATION:  3742                         FACILITY:  [facility]   PHYSICIAN:  Nanetta Batty, M.D.   DATE OF BIRTH:  1931/05/29   DATE OF ADMISSION:  06/05/2008  DATE OF DISCHARGE:  06/08/2008                               DISCHARGE SUMMARY   DISCHARGE DIAGNOSES:  1. Functional limiting claudication.  2. High-grade superficial femoral artery disease by ultrasound.  3. Peripheral vascular disease with previously placed stents to      bilateral superficial femoral arteries in the past.      a.     A 90% stenosis of the left superficial femoral artery       beginning in the origin and throughout the previously placed stent       with 3-vessel runoff as well as a 50% proximal ostial left common       iliac artery stenosis.      b.     A 95% proximal right superficial femoral artery stenosis, a       30% ostial right common iliac artery stenosis, and 30% in-stent       restenosis within the previously placed mid right superficial       femoral artery stent with 1-vessel runoff via the peroneal.      c.     Unsuccessful attempt to percutaneous transluminal       angioplasty and stent of the proximal right superficial femoral       artery.  4. Acute bleeding at cath site requiring FemoStop for hemostasis.  5. Hypotension secondary to above, treated with dopamine, and      resolved.  6. Grade 2 hematoma of the left groin.  7. Coronary artery disease with history of bypass grafting in 1989 and      multiple interventions.  8. Thyroid abnormalities, stable.  9. History of congestive heart failure secondary to diastolic      dysfunction.  10.Hypertension, controlled.  11.Hyperlipidemia.  12.Diabetes mellitus type 2.  13.Acute blood loss anemia during postprocedure, improved at      discharge.   DISCHARGE CONDITION:  Improved.   PROCEDURES:  Peripheral arteriogram on  June 05, 2008, by Dr. Nanetta Batty.   Unsuccessful PTA of the 95% stenosis to the right SFA by Dr. Allyson [name],  June 05, 2008.   DISCHARGE MEDICATIONS:  1. Ranexa 1000 mg twice a day.  2. Imdur 60 mg daily.  3. Decreased the Lasix to 20 mg in the morning and 20 mg in the      evening.  4. Metformin 500 mg twice a day to begin this evening, June 08, 2008.  5. Metoprolol 100 mg twice a day.  6. Niaspan 500 mg at night.  7. Potassium 10 mEq daily.  8. Diltiazem 300 mg daily.  9. Gabapentin 400 mg 3 times a day.  10.Pravastatin 40 mg at bedtime.  11.Benazepril 10 mg daily.  12.TriCor 145 mg daily.  13.Plavix 75 mg daily.  14.Humulin N 50 units in the morning and 45 in the  evening.  15.Aspirin 81 mg daily.  16.Pacerone 200 mg twice a day.  17.Trilipix 135 mg daily.  18.Levothyroxine 50 mcg one and half daily.  19.Ferrous sulfate 325 mg 1 twice a day.  20.Folic acid 1 mg daily.   DISCHARGE INSTRUCTIONS:  1. Low-sodium, heart-healthy, diabetic diet.  2. Wash cath site with soap and water.  Call if any bleeding,      swelling, or drainage.  3. Increase activity slowly.  May shower.  No lifting for 1 week.  No      driving for 1 week.  4. Follow up with Dr. Allyson [name], June 16, 2008, at 11:30 a.m.   Please note, when the patient sees Dr. Allyson [name], they will discuss options  most likely surgical intervention for the patient's restenosis of the  stent on the left SFA and 95% stenosis on the right, not amenable to  PTA.   Also please note, the patient listed TriCor and Trilipix on her  medication list.  I believe Trilipix is actually her med.  We will stop  the TriCor as an outpatient and I have called her to notify her of this  after she left the hospital.   HISTORY OF PRESENT ILLNESS:  A 77 year old patient of Dr. Allyson [name] with  problem list as above, presented electively on June 05, 2008, for  peripheral arteriogram and angioplasty as needed.  The patient initially  did well but then  she did start bleeding at the cath site with a  decrease in blood pressure, was placed on dopamine.  She did develop a  soft hematoma, but a large amount of ecchymosis.  She did have a drop in  her hemoglobin with this.  By the next morning, June 08, 2008, she was  stable and ready for ambulation.  She was transferred to a telemetry bed  and was ambulated with cardiac rehab and tolerated this well though her  blood pressure was still borderline.  Her medications were held for 1  day on the 21st.  By the next morning, we restarted her meds except for  Cardizem which we decreased to 120 but after her meds and after  ambulating with cardiac rehab, blood pressure was 170/80, so I resumed  all of her previous meds except the Lasix with a decrease dose.  We also  added ferrous sulfate as well as folic acid to her medical regimen.   PHYSICAL EXAMINATION:  VITAL SIGNS:  Blood pressure prior to discharge  170/80, pulse 66 to 53 and with ambulation was up to 65 and 99% room air  oxygen saturation, and temperature 97.4.  GENERAL:  Alert and oriented white female.  LUNGS:  Clear.  HEART:  Regular rate and rhythm.  EXTREMITIES:  Left groin stable.  No hematoma, but large amount of  ecchymosis extending down into her thigh on the left.   LABORATORY DATA:  Hemoglobin on admission 11.2 with hematocrit of 31.9,  WBC 3.5, and platelets 183; dropped to a low of 9.6, hematocrit of 27.7,  and WBC 5.7; and at discharge, hemoglobin 9.9, hematocrit 28.4, WBC 7.3,  and platelets 195.   Chemistry on admission, 142, potassium 4.3, chloride 104, CO2 of 30, BUN  32, creatinine 1.26, and glucose 223.  Creatinine bumped slightly but at  discharge, sodium 141, potassium 3.8, chloride 106, CO2 of 29, BUN 20,  creatinine 1.15, and glucose 40.   LFTs were normal.  AST 35, ALT 31, alkaline phos 39, total bili 0.5,  albumin was low  at 3.1, and calcium was 8.8.   She did undergo with all the bleeding of the left groin  CT of the  abdomen; sigmoid colon diverticula, extensive calcification of the iliac  and common femoral arteries, subcutaneous edema of the left thing and  femoral triangular region secondary to catheterization.  There was some  hemorrhage, but no frank hematoma and there was no retroperitoneal  hemorrhage.   The patient will follow up with Dr. Allyson [name].      Darcella Gasman. Annie Paras, N.P.      Nanetta Batty, M.D.  Electronically Signed    LRI/MEDQ  D:  06/08/2008  T:  06/09/2008  Job:  [id number]   cc:   [place] Sender

## 2010-07-02 NOTE — Cardiovascular Report (Signed)
NAME:  ABIMBOLA, AKI NO.:  [id number]   MEDICAL RECORD NO.:  [id number]          PATIENT TYPE:  INP   LOCATION:  3705                         FACILITY:  [facility]   PHYSICIAN:  Nanetta Batty, M.D.   DATE OF BIRTH:  08/15/1931   DATE OF PROCEDURE:  02/22/2007  DATE OF DISCHARGE:                            CARDIAC CATHETERIZATION   Ms. Kleine is a 77 year old female with history of CAD status post  coronary artery bypass grafting in 1989 with LIMA to LAD, vein graft to  marginal branch in the RCA.  She had stenting of her OM beyond the graft  by Dr. Tresa Endo in the past.  Her last catheterization was December 08, 2005, and was found to have an occluded vein graft to the right,  occluded native right with left-to-right collaterals, and medical  therapy was recommended.  She was admitted over the weekend with  unstable angina, rule out myocardial infarction.  She presents now for  diagnostic coronary angiography to define her anatomy and rule out  ischemic etiology.   DESCRIPTION OF PROCEDURE:  The patient was brought to the second floor  [facility] cardiac catheterization lab in the postabsorptive state.  She  was premedicated with p.o. Valium.  Right groin was prepped and shaved  in the usual sterile fashion; 1% Xylocaine was used for local  anesthesia.  A 6-French sheath was inserted into the right femoral  artery using standard Seldinger technique under direct fluoroscopic  control.  She does have an artificial right hip and calcified femoral  arteries.  The 6-French right and left Judkins diagnostic catheters  along with a 6-French pigtail catheter were used for selective coronary  angiography, left ventriculography in the RAO and LAO views, and distal  abdominal aortography.  Visipaque dye was used for the entirety of the  case.  Retrograde aortic, left ventricular and pullback pressures were  recorded.   HEMODYNAMIC RESULTS:  1. Aortic systolic pressure 119,  diastolic pressure 54.  2. Left ventricular systolic pressure 119, end-diastolic pressure 15.   SELECTIVE CORONARY ANGIOGRAPHY:  1. Left main normal.  2. LAD:  The LAD was totally occluded after the first septal      perforator and diagonal branch.  The diagonal branch had a 99%      ostial stenosis.  3. Left circumflex:  Occluded at its ostium.  4. Right coronary artery:  Occluded at its ostium.  5. Vein graft to the right coronary:  Occluded at its ostium which was      old.  6. Vein graft to the circumflex marginal branch:  Widely patent with      high-grade disease in the OM proximal to vein graft insertion.      There was a 60% AV groove circumflex lesion after the takeoff of      the OM-2 and a 99% ostial OM-3 stenosis.  7. LIMA to LAD:  Widely patent.  There were grade 2 left-to-right      collaterals both from the circumflex and LAD, filled the distal      right coronary artery.  8. Left ventriculography:  RAO left ventriculogram performed using 20      mL of Visipaque dye at 10 mL per second in each view.  There was      inferobasal moderate hypokinesia with EF  estimated visually at      approximately 45%.  9. Distal abdominal aortography:  Distal abdominal aortogram was      performed using 25 mL of Visipaque dye at 20 mL per second.  Renal      arteries were widely patent.  Infrarenal abdominal aorta had      moderate atherosclerotic changes.  There was a 30-40% ostial left      common iliac and 60% mid left common iliac artery stenosis which      was old.   IMPRESSION:  Ms. Desantiago' anatomy is unchanged since October 2007.  She  does have jeopardy of a myocardial blood flow inferiorly and  posterolaterally.  There are no good percutaneous options.  Continued  medical therapy will be recommended.  Ranexa will be added.   An ACT was measured, and the sheath was removed. Pressure was held to  groin to achieve hemostasis.  The patient left the lab in stable   condition.      Nanetta Batty, M.D.  Electronically Signed     JB/MEDQ  D:  02/22/2007  T:  02/22/2007  Job:  [id number]   cc:   2nd Floor [facility]  [facility]  [place] Sender

## 2010-07-02 NOTE — Discharge Summary (Signed)
NAMEMarland Kitchen  ROBY, SPALLA            ACCOUNT NO.:  [id number]   MEDICAL RECORD NO.:  [id number]          PATIENT TYPE:  INP   LOCATION:  2921                         FACILITY:  [facility]   PHYSICIAN:  Nanetta Batty, M.D.   DATE OF BIRTH:  01-20-1932   DATE OF ADMISSION:  09/08/2007  DATE OF DISCHARGE:  09/14/2007                               DISCHARGE SUMMARY   DISCHARGE DIAGNOSES:  1. Unstable angina pectoris - recurrent.  The patient underwent      catheterization during this admission which revealed lesions in the      native disease and graft not amenable for percutaneous coronary      intervention.  2. History of coronary artery disease with previous coronary artery      bypass grafting in 1989.  3. Left ventricular dysfunction with ejection fraction of 45-50%.  4. Cerebrovascular arterial disease with bilateral superficial femoral      artery stents.  5. Hypertension.  6. Diabetes mellitus type 2.  7. Atrial fibrillation.  8. History of diverticulosis.  9. Gastroesophageal reflux disease.  10.Nephrolithiasis.  11.History of lymphoma.  12.Degenerative lumbar disk disease.  13.Status post cholecystectomy.  14.Status post right hip replacement.  15.Status post right knee arthroplasty.  16.Status post hysterectomy.   This is a 77 year old Caucasian female patient of Dr. Allyson [name] who  presented to the hospital with complaints of chest pain that she  described as a heavy feeling in the chest, relieved with sublingual  nitroglycerin.  The pain start at rest and also she noticed that with  exertion and became progressively worse throughout the day and  eventually, the patient presented to the emergency room where she was  started on IV nitroglycerin which dropped her systolic blood pressure to  60s.  It was discontinued and the patient was admitted to Telemetry Unit  on morphine, aspirin, and Lovenox.  The next morning, she was taken to  the cath lab by Dr. Lynnea Ferrier.  The cath  revealed left ventricular  systolic function mildly depressed with EF of 45-50% and inferior basal  hypokinesis.  She had native dominant right with 100% ostial lesion,  also had a 100% stenosis of LAD, 65% stenosis of the proximal  circumflex, 90% ostial marginal branch 1, and 95% ostial marginal branch  2.  The patient also had in-stent restenosis of 35% in the mid lesion of  LAD.  Her grafts were as following:  LIMA to the LAD was 25% discrete  stenosis at the ostium, SVG to obtuse marginal 1 patent, SVG to the RCA  with 100 occluded in the proximal portion and the patient had a  collateral flow from left to right obtuse marginal 3 to PDA, obtuse  marginal 2 to PDA, and marginal 1 to PDA.  She also had collaterals from  LAD to PDA.  Conclusion was severe three-vessel coronary disease with  100% SVG to RCA occlusion, but patent LIMA to LAD and patent SVG to  obtuse marginal 1 and recommendations were to proceed with medical  therapy.  The patient continued having some chest pain throughout her  hospitalization.  Dr. Allyson [name] assessed  and felt like it could be related  to the collateral insufficiency to the inferior wall and RCA occlusion.  Also, it could be caused by ischemia in the posterior lateral branches  of distal left circumflex from high-grade proximal obtuse marginal  disease which was before the SVG insertion.  It was doubtful that the  lesion could be percutaneously addressed, also the patient had ischemia  in the diagonal territory which was supplied by collaterals.  He  increased the patient's Ranexa and Imdur and the patient was continued  to stay at hospital for observation on September 14, 2007.  The patient was  seen by Dr. Allyson [name] who felt that she could be safely discharged home on  adjusted dose of medications.   HOSPITAL LABORATORIES:  Her cardiac enzymes were cycled three times and  they were negative x3.  Her BMP showed sodium 142, potassium 4.3,  chloride 103, CO2 30,  glucose 155, BUN 21, creatinine 1.10, calcium 9.6.  CBC showed white blood cells 5.6, hemoglobin 11.8, hematocrit 34.3,  platelet count 252, hemoglobin A1c 7.2.  BNP 137, magnesium 1.8.  Liver  function test showed normal transaminases, ALT 17, AST 22, total  bilirubin 0.7, and alkaline phosphatase 49.   DISCHARGE MEDICATIONS:  1. Metformin 500 mg b.i.d.  2. Metoprolol 100 mg b.i.d.  3. Niaspan 500 mg q.h.s.  4. Potassium 10 mEq daily.  5. Isosorbide mononitrate 30 mg daily.  6. Clindamycin SBE prophylaxis as needed.  7. Diltiazem 300 mg daily.  8. Gabapentin 400 mg t.i.d.  9. Furosemide 40 mg daily.  10.Ranexa 500 mg b.i.d.  11.Pravachol 40 mg q.h.s.  12.Benazepril 10 mg daily.  13.Imdur 60 mg daily.  14.Tricor 145 mg daily.  15.Plavix 75 mg daily.  16.Humulin N 50 units q.a.m. and 45 units q.p.m.   FOLLOWUP:  Followup appointment will be scheduled with Dr. Allyson [name] in 2-3  weeks and office will notify patient.      Raymon Mutton, P.A.      Nanetta Batty, M.D.  Electronically Signed    MK/MEDQ  D:  09/14/2007  T:  09/15/2007  Job:  [id number]

## 2010-07-02 NOTE — Op Note (Signed)
NAMEMarland Kitchen  Sara, [name]            ACCOUNT NO.:  [id number]   MEDICAL RECORD NO.:  [id number]          PATIENT TYPE:  INP   LOCATION:  1526                         FACILITY:  [facility]   PHYSICIAN:  Ollen Gross, M.D.    DATE OF BIRTH:  1931/03/01   DATE OF PROCEDURE:  07/15/2006  DATE OF DISCHARGE:                               OPERATIVE REPORT   PREOPERATIVE DIAGNOSIS:  Osteoarthritis right knee.   POSTOPERATIVE DIAGNOSIS:  Osteoarthritis right knee.   PROCEDURE:  Right total knee arthroplasty.   SURGEON:  Ollen Gross, M.D.   ASSISTANT:  Alexzandrew L. Perkins, P.A.-C.   ANESTHESIA:  General with postop Marcaine pain pump.   ESTIMATED BLOOD LOSS:  Minimal.   DRAINS:  None.   TOURNIQUET:  None used.   COMPLICATIONS:  None.   CONDITION:  Stable to the recovery room.   CLINICAL NOTE:  [name] is a 77 year old female who has end stage  arthritic change of the right knee with progressively worsening pain and  dysfunction.  She has failed nonoperative management and presents for a  total knee arthroplasty.   PROCEDURE IN DETAIL:  After the successful administration of general  anesthetic, a tourniquet was placed high on her right thigh and her  right lower extremity is prepped and draped in the usual sterile  fashion.  The knee is flexed and the standard midline incision made with  a 10 blade through the subcutaneous tissue to the level of the extensor  mechanism.  Minor bleeding was stopped with cautery.  The cautery is  used to make a medial parapatellar arthrotomy.  Soft tissue of the  proximal medial tibia is subperiosteally elevated to the joint line with  the knife and into the semimembranosus bursa with a Cobb elevator.  The  soft tissue laterally is elevated with attention being paid to avoid the  patellar tendon or the tibial tubercle.  The patella was subluxed  laterally, knee flexed 90 degrees, ACL and PCL were removed.  A drill  was used to create a  starting hole in the distal femur and the canal was  thoroughly irrigated.  A 5 degree right valgus alignment guide is  placed, rotation is marked, and the block pinned to remove 10 mm of the  distal femur.  Distal femoral resection is made with an oscillating saw.  Sizing block is placed, size 2.5 is most appropriate.  Rotation is  marked off the epicondylar axis and a size 2.5 cutting block is placed.  The anterior, posterior, and chamfer cuts were made.   The tibia was subluxed forward and menisci are removed.  The  extramedullary tibial alignment guide is placed referencing proximally  at the medial aspect of the tibial tubercle and distally along the  second metatarsal axis and tibial crest.  The block is pinned to remove  10 mm off the non-deficient lateral side.  Tibial resection is made with  an oscillating saw.  A size 2.5 is the most appropriate tibial component  and the proximal tibia is prepared with the modular drill and keel punch  for a size 2.5.  Femoral  preparation is completed with the intercondylar  cut.   A size 2.5 mobile bearing tibial trial, 2.5 posterior stabilized femoral  trial, and a 10 mm posterior stabilized rotating platform insert trial  are placed.  With the 10, full extension is achieved with excellent  varus and valgus balance throughout full range of motion.  The patella  was then everted and thickness measured to be 20 mm.  Free hand  resection was taken to 12 mm, 35 template is placed, lug holes were  drilled, trial patella was placed and it tracks normally.  Osteophytes  were removed off the posterior femur with the trial in place.  All  trials were removed and the cut bone surface is prepared with the  pulsatile lavage.  Cement is mixed and once ready for implantation, the  size 2.5 mobile bearing tibial tray, 2.5 posterior stabilized femur and  35 patella are cemented into place and the patella is held with a clamp.  The trial 10 mm insert is  placed, the knee held in full extension, all  extruded cement removed.  Once the cement was fully hardened, the  permanent 10 mm posterior stabilized rotating platform insert is placed  in the tibial tray.  The wound was copiously irrigated with saline  solution and the extensor mechanism closed with interrupted #1 PDS.  Flexion against gravity is 135 degrees.  Please note that FloSeal had  been placed posteriorly as well as in the medial and gutters,  suprapatellar area, prior to closure and was irrigated out the prior to  closure.  The subcuticular layer is closed with a running 4-0 Monocryl.  The catheter for Marcaine pain pump is placed and the pump is initiated.  Steri-Strips and a bulky sterile dressing are applied.  The right lower  extremity was then placed into a knee immobilizer and the patient was  awakened and transferred to recovery in stable condition.      Ollen Gross, M.D.  Electronically Signed     FA/MEDQ  D:  07/15/2006  T:  07/15/2006  Job:  [id number]

## 2010-07-02 NOTE — Procedures (Signed)
NAME:  [name], [name]            ACCOUNT NO.:  [id number]   MEDICAL RECORD NO.:  [id number]          PATIENT TYPE:  INP   LOCATION:  2807                         FACILITY:  [facility]   PHYSICIAN:  Nanetta Batty, M.D.   DATE OF BIRTH:  1931/09/26   DATE OF PROCEDURE:  05/24/2009  DATE OF DISCHARGE:                    PERIPHERAL VASCULAR INVASIVE PROCEDURE   PROCEDURE PERFORMED:  CT angiogram and PTF stent.   HISTORY:  [name] is a 77 year old, mildly overweight, Caucasian,  female with history of CAD and PVAD.  She had bypass grafting in 1989  with multiple interventions since.  She has had bilateral SFA stenting  in the past.  Other problems include hypertension, hyperlipidemia, and  diabetes.  She does have function-limiting claudication right much  greater than left with high-grade proximal right SFA stenosis, which I  was unable to intervene on a year ago because of technical issues  regarding her aortoiliac bifurcation.  Her procedure was complicated by  a large groin hematoma without retroperitoneal bleed.  She saw Dr.  [name] who agreed to surgically revascularize her if she had a more  recent angiogram.  Her ABI is 0.65 on the right and a high-frequency  signal at 642 cm/sec in the proximal portion of the right SFA.  She  presents here for angiography and potential intervention.   DESCRIPTION OF PROCEDURE:  The patient was brought to the second floor  [facility] in the post absorptive state.  She was  premedicated with p.o. Valium.  She also received 25 mcg of fentanyl.  Her left groin was prepped and shaved in the usual sterile fashion.  Xylocaine 1% was used for local anesthesia.  A 5-French sheath was  inserted into the left femoral artery using standard Seldinger  technique.  A 5-French pigtail catheter was used for abdominal  aortography with bifemoral runoff using digital subtraction and  __________technique.  Visipaque dye was used for the  entirety of the  case.  Retrograde pressure was monitored during the case.   ANGIOGRAPHIC RESULTS:  1. Abdominal aorta:      a.     Renal arteries normal.      b.     Infrarenal abdominal aorta normal.  2. Left lower extremity significant for      a.     70% ostial left common iliac artery stenosis without       pullback gradient      b.     60% left external iliac artery stenosis.      c.     Occluded SFA, probably related to the sheath, though the       distal SFA/popliteal stent on the left did appear to be patent.  3. Right lower extremity significant for      a.     99% segmental serial proximal right SFA stenoses.      b.     Patent right SFA stent in the mid to distal portion of the       vessel with 40% in-stent restenosis.      c.     One-vessel runoff via the peroneal.  DESCRIPTION OF PROCEDURE:  The patient received 3,000 units of heparin  intravenously.  Contralateral access was obtained with a cross-over  catheter, inflow catheter and Wholey wire.  Following this, a 6-French  Ansel cross-over sheath was carefully manipulated just across the  bifurcation, allowing contralateral access.  The [facility] wire was then  advanced across the stenosis with the aid of a 4x4 FoxCross balloon and  thus overlapping inflations were performed at nominal pressures.  Following this, a 6x8 Cordis Smart Nitinol self-expanding stent was then  deployed under angiographic and fluoroscopic control and post dilated  with a 5x6 FoxCross at 4 atmospheres, resulting in reduction of a tandem  99% stenosis in the proximal right SFA to 0% residual with excellent  flow.  The patient tolerated the procedure well.   The sheath was then withdrawn across the bifurcation and exchanged for a  short 6-French sheath.  The patient left the lab in stable condition.  She will be gently hydrated overnight and discharged home in the morning  on aspirin and Plavix.  The patient will get follow up Dopplers and  ABIs  and I will see her back in the office in one to two weeks for follow up.      Nanetta Batty, M.D.     JB/MEDQ  D:  05/24/2009  T:  05/24/2009  Job:  [id number]   cc:   [facility]  Loma Sender  [name]. [name], M.D.   Electronically Signed by Nanetta Batty M.D. on 06/07/2009 01:20:31 PM

## 2010-07-02 NOTE — Assessment & Plan Note (Signed)
OFFICE VISIT   [name], [name] A  DOB:  1931/02/21                                       03/01/2009  WUJWJ#:[id number]   I saw the patient in the office today for continued followup of her  peripheral vascular disease.  I had initially seen her in consultation  in September of 2010.  She had undergone previous stenting of the right  superficial femoral artery by Dr. Allyson [name] and had developed recurrent  claudication.  She had an arteriogram in April of 2010 which showed a  tight focal stenosis at the origin of the right superficial femoral  artery.  This could not be approached from an endovascular standpoint  and she was sent for vascular consultation.  At that time I explained to  her that we could consider addressing the proximal superficial femoral  artery stenosis on the right with endarterectomy and vein patch  angioplasty.  However, given the significant change in her symptoms and  the fact that she had not had an arteriogram since April it might be  worth repeating her arteriogram prior to planning elective repair of  this stenosis.  In addition, she had been having some chest pain and I  was concerned about her risk from a cardiac standpoint given her age and  multiple comorbidities.  She now comes in for a 3 month followup visit.   Since I saw her last she continues to have bilateral calf claudication  which occurs at a fairly short distance around the house.  This has been  relatively stable over the last 3 months.  She has had no significant  progression of her symptoms.  Her symptoms involve the calves and not  the thigh or hip.  The symptoms are more significant on the right side.  Her symptoms are brought on by ambulation and relieved with rest.  There  are no other aggravating or alleviating factors.  She has had no history  of rest pain or history of nonhealing ulcers.   REVIEW OF SYSTEMS:  CARDIOVASCULAR:  She has had no further chest  pain  recently and no significant chest pressure.  She does admit to dyspnea  on exertion.  She has had no palpitations or arrhythmias.  She has had  no history of stroke, TIAs or amaurosis fugax.  No history of DVT or  phlebitis.  PULMONARY:  She does have a history of bronchitis in the past.  She has  had no recent productive cough, asthma or wheezing.   SOCIAL HISTORY:  She is married.  She has four children.  She quit  tobacco in 1989.   PHYSICAL EXAMINATION:  General:  This is a pleasant 77 year old woman  who appears her stated age.  Vital signs:  Blood pressure is 181/69,  temperature is 98.6, heart rate is 72.  Lungs:  Are clear bilaterally to  auscultation without rales, rhonchi or wheezing.  Cardiovascular:  I do  not detect any carotid bruits.  She has a regular rate and rhythm  without murmur appreciated.  She has no significant lower extremity  swelling.  She has palpable radial and femoral pulses bilaterally.  I  cannot palpate popliteal or pedal pulses on either side.  Abdomen:  Soft  and nontender with no masses appreciated.  She has normal pitched bowel  sounds.  Neurologic:  Exam is  nonfocal.  Extremities:  She has no  significant ulcers or rashes present.   I did review her arterial Doppler study which was done at [facility] which I did not have available at my last visit.  ABI  on the right was 65% and on the left 76%.  There was some irregular  plaque in the common femoral artery on the right and the proximal  superficial femoral artery had a significant stenosis in the 70%-99%  range.  There was some irregularity throughout the mid to distal  superficial femoral artery with a patent popliteal artery and two vessel  runoff.  On the left side there was a 70%-99% stenosis in the proximal  to mid superficial femoral artery.   With respect to her infrainguinal arterial occlusive disease I have  explained that we generally would not pursue  revascularization unless  she developed disabling claudication, rest pain or nonhealing ulcer.  Currently her symptoms are quite stable with the most recent ABI of 65%  on the right which is her side with the more significant symptoms.  I  have explained that if she felt that her symptoms were disabling we  could certainly consider endarterectomy and vein patch at the proximal  superficial femoral artery stenosis.  However, she has not had an  arteriogram in 9 months and I would want to repeat that study before  planning elective surgery.  In addition, she would need preoperative  cardiac clearance.  Currently she feels that her symptoms are stable and  is agreeable to continue with conservative treatment.  Given her age and  other medical comorbidities I think this is perfectly reasonable.  She  does have a history of adult onset diabetes which has been stable on her  current medications.  She also has coronary artery disease and a history  of congestive heart failure which has also been stable recently.  In  addition, she has hypertension and COPD.  I plan on seeing her back in 6  months.  However, when she sees Dr. Allyson [name] if she decides that she wishes  to pursue surgery he can arrange to proceed with arteriography and  preoperative cardiac evaluation and then we can range for elective  surgery if she wishes to proceed based on the results of her  arteriogram.     [name]. [name], M.D.  Electronically Signed   CSD/MEDQ  D:  03/01/2009  T:  03/02/2009  Job:  [id number]   cc:   [name], M.D.

## 2010-07-02 NOTE — Discharge Summary (Signed)
NAMEMarland Kitchen  [name], [name]            ACCOUNT NO.:  [id number]   MEDICAL RECORD NO.:  [id number]          PATIENT TYPE:  INP   LOCATION:  3715                         FACILITY:  [facility]   PHYSICIAN:  Antonieta Iba, MD   DATE OF BIRTH:  01-Mar-1931   DATE OF ADMISSION:  10/13/2007  DATE OF DISCHARGE:  10/16/2007                               DISCHARGE SUMMARY   ADDENDUM   This is an addendum to the discharge summary I just dictated and that  discharge number was [id number].   Please note, I discussed with the patient's daughter who dispenses the  medications.  The patient is on Zantac generic at home, it was once a  day and I have increased it to twice a day.  Also, she is on 81 mg of  aspirin daily and I asked the daughter about Imdur, isosorbide,  previously she had been on 30 mg daily in July, it was increased to 60  mg daily, and she is taking 60 mg daily, so I told her just to stay on  the 60 mg daily, not the 90 mg that was originally written on the  prescription and the discharge medication reconciliation sheet and that  we may need to increase this if her pain returns.  She was agreeable to  these changes and the patient will follow up with Dr. Allyson [name] as  previously instructed.      Darcella Gasman. Annie Paras, N.P.      Antonieta Iba, MD  Electronically Signed    LRI/MEDQ  D:  10/16/2007  T:  10/17/2007  Job:  [id number]

## 2010-07-02 NOTE — Op Note (Signed)
[name], [name]            ACCOUNT NO.:  [id number]   MEDICAL RECORD NO.:  [id number]          PATIENT TYPE:  AMB   LOCATION:  NESC                         FACILITY:  [facility]   PHYSICIAN:  Ollen Gross, M.D.    DATE OF BIRTH:  Aug 07, 1931   DATE OF PROCEDURE:  10/07/2006  DATE OF DISCHARGE:                               OPERATIVE REPORT   PREOPERATIVE DIAGNOSIS:  Arthrofibrosis, right knee.   POSTOPERATIVE DIAGNOSIS:  Arthrofibrosis, right knee.   PROCEDURE:  Right knee closed manipulation.   SURGEON:  Ollen Gross, M.D.   ASSISTANT:  No assistant.   ANESTHESIA:  General.   COMPLICATIONS:  None.   FINDINGS:  Premanipulation range of motion 10-90, postmanipulation range  of motion 5-125.   BRIEF CLINICAL NOTE:  [name] is a 77 year old female who underwent  right total knee arthroplasty, Jul 15, 2006.  She has had difficulty  obtaining flexion and range of motion and has plateaued in physical  therapy despite aggressive therapy.  She has been fixed at 90-95  degrees.  She is having a difficult time functioning and presents now  for closed manipulation.   PROCEDURE IN DETAIL:  After the successful administration of a general  anesthetic, exam under anesthesia showed range of 10-90.  I then placed  my chest on the proximal tibia, flexed the knee with audible lysis of  adhesions.  I was able to flex her easily to 125 degrees.  I was able to  get within 5 degrees of full extension.  I mobilized the patella and it  was moving more freely.  We then awakened her and she was transported to  Recovery in stable condition.      Ollen Gross, M.D.  Electronically Signed     FA/MEDQ  D:  10/07/2006  T:  10/08/2006  Job:  [id number]

## 2010-07-02 NOTE — Procedures (Signed)
NAME:  [name], [name]            ACCOUNT NO.:  [id number]   MEDICAL RECORD NO.:  [id number]          PATIENT TYPE:  OIB   LOCATION:  6529                         FACILITY:  [facility]   PHYSICIAN:  Nanetta Batty, M.D.   DATE OF BIRTH:  [birth year]   DATE OF PROCEDURE:  DATE OF DISCHARGE:                    PERIPHERAL VASCULAR INVASIVE PROCEDURE    [name] is a 77 year old mildly overweight married Caucasian female  with history of CAD and PVOD.  She had bypass grafting in 1989 with  multiple interventions since.  She has had bilateral SFA stenting in the  past.  Other problems include hypertension, hyperlipidemia, and  diabetes.  She does have function limiting claudication, right greater  than left in the past with high-grade ostial as well as proximal right  SFA stenosis which I was unable to intervene on because of technical  issues crossing the bifurcation a year and a half ago.  I reangiogramed  her in April and was able to cross the bifurcation and stent her  proximal SFA which resulted in clinical improvement as well as  improvement in her Dopplers.  She did have ostial left SFA stenosis  which was occluded since by duplex ultrasound.  Her symptoms on her  right leg improved for 6 months, but she had recurrent claudication with  worsening Dopplers.  She presents now for angiography and intervention.   PROCEDURE DESCRIPTION:  The patient was brought to the second floor of  [facility] in the postabsorptive state.  She was  premedicated with p.o. Valium.  Her left groin was prepped and shaved in  the usual sterile fashion.  Xylocaine 1% was used for local anesthesia.  A 5-French sheath was inserted into left femoral artery using standard  Seldinger technique.  A 5-French pigtail catheter was used for abdominal  aortography with bifemoral runoff using bolus chase digital subtraction  step table technique.  Visipaque dye was used for the entirety of the  case.  Retrograde aortic pressure was monitored during the case.   HEMODYNAMIC RESULTS:  Aortic systolic pressure 183, diastolic pressure  85.   ANGIOGRAPHIC RESULTS:  1. Abdominal aorta; no significant disease.  2. Left lower extremity.      a.     A 50% ostial left common iliac artery stenosis without a       pullback gradient after administration of 200 mcg intra-arterial       nitroglycerin via the sidearm sheath.      b.     Total SFA at the ostium with reconstitution at the profunda       femoris, collaterals, and above-the-knee popliteal.      c.     Two-vessel runoff with an occluded anterior tibial.  3. Right lower extremity.      a.     A 90% to 95% diffuse in-stent restenosis within the       proximal right SFA stent.  The ostium of the right SFA had a 60%       stenosis.  Following this, there was diffuse calcified 70%       stenosis between the proximal and  mid stent.  The mid stent had       40% in-stent restenosis.  There was one-vessel runoff via the       peroneal.   PROCEDURE DESCRIPTION:  The patient received 5000 units of heparin  intravenously.  Contralateral access was obtained with a 5-French  crossover catheter, Rosen wire, and 6-French 45-cm long angled Ansel  sheath.  I was able to cross the bifurcation.  I then exchanged the  Rosen wire for [employer] wire and crossed the proximal SFA.  I dilated  within the stent with a 5 x 4 Fox-SV using overlapping inflations at 10  atmospheres resulting in reduction of diffuse 95% in-stent restenosis,  less than 10% residual with excellent flow.  The patient tolerated the  procedure well.  Sheath was withdrawn back across the bifurcation and  secured.  The patient left the lab in stable condition.  The patient  will be hydrated overnight.  Sheaths will be removed once ACT falls  below 170.  The patient will be  treated with aspirin and Plavix.  She will be discharged home in the  morning and will get followup Dopplers  and ABIs.  She is to see me back  after that and follow up.  The only potential treatment for her left  lower extremity is fem-pop bypass grafting.      Nanetta Batty, M.D.      JB/MEDQ  D:  01/22/2010  T:  01/23/2010  Job:  [id number]   cc:   [facility]  [facility]  L[place] Sender   Electronically Signed by Nanetta Batty M.D. on 02/03/2010 30:86:57 PM

## 2010-07-02 NOTE — Consult Note (Signed)
NEW PATIENT CONSULTATION   [name], [name]  DOB:  07-31-1931                                       11/09/2008  ZOXWR#:[id number]   I saw the patient in the office today in consultation concerning her  peripheral vascular disease.  She was referred by Dr. Allyson [name].  This is [name]  pleasant 77 year old woman who has undergone previous stenting of the  right superficial femoral artery by Dr. Allyson [name].  She had developed  progressive claudication and underwent an arteriogram on April 19th of  this year which reportedly showed [name] tight focal right SFA ostial  stenosis.  It was felt that this would be difficult to approach from an  endovascular standpoint and she was sent for vascular consultation.   She tells me that she has had [name] long history of claudication in both  lower extremities that involves her calves that is brought on by  ambulation and relieved with rest.  This occurs at [name] very short  distance.  She does not have any significant thigh or hip claudication.  She has some mild rest pain in the right foot, but also has some  neuropathy bilaterally.  She has had no history of nonhealing ulcers.  She states that since her arteriogram in April her symptoms in the right  leg have worsened.   PAST MEDICAL HISTORY:  Significant for insulin dependent diabetes,  hypertension, coronary artery disease, CHF, COPD.  Of note, she has  undergone [name] previous coronary revascularization with vein taken from the  left leg.  She has had multiple previous coronary interventions also.   FAMILY HISTORY:  Significant for [name] father who died from [name] heart attack  at age 41.  Her mother lived to age 28 but also had coronary artery  disease and peripheral vascular disease.  She is unaware of any other  history of premature cardiovascular disease.   SOCIAL HISTORY:  She is married, she has four children.  She quit  tobacco in 1989.   MEDICATIONS:  Are documented on the medical history form in  her chart.   ALLERGIES:  Include penicillin, IV dye and Valium.   REVIEW OF SYSTEMS:  She has chest pain and chest pressure and  significant dyspnea on exertion.  This has been relatively stable,  although she is scheduled for [name] stress test in the near future.  The  remainder of her review of systems is documented on the medical history  form in her chart.   PHYSICAL EXAMINATION:  [name] who appears  her stated age.  Her blood pressure is 118/58, heart rate is 66.  HEENT:  Unremarkable.  Neck:  Supple, there is no cervical lymphadenopathy.  I  do not detect any carotid bruits.  Lungs:  Are clear bilaterally to  auscultation.  Cardiac Exam:  She has [name] regular rate and rhythm.  Abdomen:  Soft and nontender.  She is moderately obese.  She has [name]  palpable right femoral pulse and [name] slightly diminished left femoral  pulse.  I cannot palpate popliteal or pedal pulses on either side.  She  has monophasic Doppler signals in both feet.  She has no ischemic  ulcers.  She has no significant lower extremity swelling.  Neurologic  Exam:  Nonfocal.   I did review her arteriogram which shows that her  previously stented  superficial femoral artery is patent although there is some mild diffuse  disease throughout the superficial femoral artery.  One view does  demonstrate what appears to be [name] small plaque in the proximal right  superficial femoral artery which I am assuming is the ostial stenosis  that is referred to.  She has single-vessel runoff on the right via the  peroneal artery.   The patient is scheduled for an ultrasound at Dr. Hazle Coca office and if  this did in fact demonstrate [name] tight focal stenosis of the superficial  femoral artery then consideration could be given to local endarterectomy  with vein patch angioplasty.  However.  I am concerned that her symptoms  have worsened since April and that she may need repeat arteriogram  before considering surgical  intervention.  However, I think the  ultrasound would be helpful in being sure that the stents are patent on  the right and that there is [name] tight focal stenosis in the proximal  superficial femoral artery.  In addition, she is scheduled for [name] stress  testing and given her history of chest pain and chest pressure certainly  would want Dr. Hazle Coca clearance, from [name] cardiac standpoint, before  considering any surgery.  Despite her multiple medical comorbidities and  age, I think it would be reasonable to consider local endarterectomy and  vein patch angioplasty of the right superficial femoral artery pending  the results of her duplex and stress test as described above.  I will  wait to hear from Dr. Allyson [name]; otherwise, I plan on seeing her back in 3  months.   [name]. [name], M.D.  Electronically Signed   CSD/MEDQ  D:  11/09/2008  T:  11/10/2008  Job:  [id number]   cc:   Nanetta Batty, M.D.

## 2010-07-02 NOTE — Discharge Summary (Signed)
[name]  [name], [name]            ACCOUNT NO.:  [id number]   MEDICAL RECORD NO.:  [id number]          PATIENT TYPE:  INP   LOCATION:  3708                         FACILITY:  [facility]   PHYSICIAN:  Nanetta Batty, M.D.   DATE OF BIRTH:  05-Jun-1931   DATE OF ADMISSION:  04/05/2008  DATE OF DISCHARGE:  04/10/2008                               DISCHARGE SUMMARY   DISCHARGE DIAGNOSES:  1. Acute-on-chronic diastolic heart failure, improved at discharge,      probably secondary to dietary indiscretion.  Discharge weight was      68 kg.  2. Cardiomyopathy with an ejection fraction of 45-50% by      catheterization July 2009.  3. Coronary artery bypass grafting x4 1989 with obtuse marginal stent      placement after the graft insertion in October 2003.  Subsequent      catheterizations have resulted in continued medical therapy, her      last catheterization was in July 2009.  4. Peripheral vascular disease, the patient was to have a peripheral      vascular angiogram but developed heart failure.  She has a history      of prior bilateral superficial femoral artery interventions.      Recent Dopplers with some progression in both superficial femoral      arteries and she does have claudication.  5. Type 2 insulin-dependent diabetes.  6. Treated hypothyroidism.  7. Treated dyslipidemia.  8. History of lymphoma 3 years ago, [facility] [place].  She says she was treated with surgery.  9. Treated hypothyroidism.  10.History of spinal stenosis, status post surgery in 2001.   HOSPITAL COURSE:  The patient is a 77 year old female followed by Dr.  Vear Clock and Dr. Allyson [name].  She has a history of coronary artery bypass  grafting in 1989 with intervention in 2003.  She has had several  catheterizations since then resulting in medical therapy.  Her last cath  was in July 2009.  She has a patent LIMA to the LAD, an occluded SVG to  the circumflex OM and an occluded  SVG to the RCA with left-to-right  collaterals.  Her EF was 45-50%.  She presented with dyspnea on exertion  and orthopnea and some chest pressure for the last several days.  She  had been seen by Dr. Allyson [name] as an outpatient and was to have a peripheral  angiogram for progression of her peripheral vascular disease in her  lower extremities with claudication and an abnormal Doppler.  She was  admitted to telemetry, started on IV diuretics.  Troponins were  negative.  BNP was 236 on admission.  She improved symptomatically with  diuresis.  Her medications were adjusted.  We feel she can be discharged  April 10, 2008.  We will hold off on her peripheral angiogram at this  time.  She will see Dr. Allyson [name] as an outpatient.  White count 5.4,  hemoglobin 13.3, hematocrit 38.1, platelets 270.  Sodium 137, potassium  4.8, BUN 32, creatinine 1.23.  BNP today is 74.  TSH  on admission was  2.9.  EKG shows sinus rhythm with a right bundle branch block.  Cholesterol 132, LDL 65, HDL 24, triglycerides 217.  Chest x-ray on  April 05, 2008 shows no acute process.   DISCHARGE MEDICATIONS:  1. Plavix 75 mg a day.  2. Humulin R 50 units in the morning and Humulin R 45 units in the      afternoon.  3. Levothroid 0.15 mg a day.  4. Ranexa 1 g b.i.d.  5. Imdur 90 mg a day.  6. Lasix 40 mg a day.  7. Metformin 500 mg twice a day.  8. Metoprolol 75 mg b.i.d.  9. Niaspan 500 mg at bedtime.  10.Potassium 10 mEq a day.  11.Diltiazem 300 mg a day.  12.Neurontin 400 mg t.i.d.  13.Pravastatin 40 mg a day.  14.Benazepril 10 mg a day.  15.TriCor 145 mg a day.   DISPOSITION:  The patient is discharged in stable condition.  She will  see Dr. Allyson [name] as an outpatient.      Abelino Derrick, P.A.      Nanetta Batty, M.D.  Electronically Signed    LKK/MEDQ  D:  04/10/2008  T:  04/10/2008  Job:  [id number]   cc:   Loma Sender

## 2010-07-02 NOTE — Cardiovascular Report (Signed)
[name]  IREENE, BALLOWE NO.:  [id number]   MEDICAL RECORD NO.:  [id number]          PATIENT TYPE:  INP   LOCATION:  2913                         FACILITY:  [facility]   PHYSICIAN:  Nanetta Batty, M.D.   DATE OF BIRTH:  23-Apr-1931   DATE OF PROCEDURE:  DATE OF DISCHARGE:                            CARDIAC CATHETERIZATION   [name] is a 77 year old mildly overweight Caucasian female with history  of CAD and PVOD.  She has had bypass surgery in 1989 with multiple  interventions including cutting balloon atherectomy to her LIMA and LAD  in October 2003.  She has had bilateral SFA stenting in the past.  Other  problems include hypertension, hyperlipidemia and diabetes.  She has  functional limiting claudication with high-grade SFA disease by duplex  ultrasound.  She was catheterized by Dr. Lynnea Ferrier in July 2009 revealing  unchanged anatomy.  She has also had thyroid abnormalities which had  been addressed and corrected.  She was admitted to [facility]  in April 05, 2008 through April 10, 2008 with CHF secondary to  diastolic dysfunction.  Her medicines were adjusted.  She presents now  for angiography and potential intervention for functional limiting  claudication.   DESCRIPTION OF PROCEDURE:  The patient was brought to the Second Floor  [facility] in the postabsorptive state.  She was  premedicated with p.o. Valium, IV Versed, fentanyl and Solu-Medrol,  Benadryl and Pepcid for contrast allergy prophylaxis as well as  Mucomyst.  She was placed in a bicarb drip.  Visipaque dye was used for  the entirety of the case.  Retrograde aortic pressure was monitored  during the case.   ANGIOGRAPHIC RESULTS:  1. Abdominal aorta.      a.     Renal arteries - normal.      b.     Infrarenal abdominal aorta - mild atherosclerotic narrowing.  2. Left lower extremity;      a.     50% proximal/ostial left common iliac artery stenosis.      b.      Long 90% + stenosis of the SFA beginning in its origin       through the previously placed stent into the mid vessel..  There       was three-vessel runoff.  3. Right lower extremity;      a.     30% ostial right common iliac artery stenosis.      b.     95% proximal right SFA stenosis.      c.     30% in-stent restenosis within the previously placed mid       right SFA stent with one-vessel runoff via the peroneal.   DESCRIPTION OF PROCEDURE:  The patient received 4000 units of heparin  intravenously.  Contralateral access was obtained with a crossover  catheter, Wholey, Amplatz and finally a Lunderquist wire.  Attempts were  made to cross the bifurcation with a 6-French Terumo crossover sheath  unsuccessfully because of calcification.  Ultimately, the procedure was  abandoned.   IMPRESSION:  Unsuccessful attempted percutaneous transluminal  angioplasty and  stenting of the proximal right superficial femoral  artery for functional limiting claudication of a 95% proximal right  superficial femoral artery lesion.  The ACT was measured at 180.  The  sheath was removed and pressure was held in the groin to achieve  hemostasis.  The patient left the laboratory in stable condition.   She should be hydrated, discharge home in the morning.  I will discuss  her options in the office and I will see her back in followup.      Nanetta Batty, M.D.  Electronically Signed     JB/MEDQ  D:  06/05/2008  T:  06/06/2008  Job:  [id number]   cc:   Laurell Josephs Second Floor  [facility]  [facility]  Janetta Hora. Darrick Penna, MD  [facility]

## 2010-07-02 NOTE — Assessment & Plan Note (Signed)
OFFICE VISIT   [name], [name] A  DOB:  07-11-1931                                       03/06/2010  ZOXWR#:[id number]   I saw [name] in the office today for continued follow-up of her  peripheral vascular disease.  She has had SFA angioplasty and stenting  bilaterally most recently in December 2011 on the right side by Dr.  Allyson [name].  She has a long segment occlusion of her left superficial femoral  artery and has had progressive claudication symptoms on the left.  She  is sent for evaluation for possible left fem-pop bypass grafting.  Of  note, she states that the pain in her left leg has come on gradually and  has gradually progressed over the last 6 months.  She experiences pain  in the calf with ambulation that is relieved with rest.  She has also  had some pain in her left hip which radiates posterior.  I do not get  any history of rest pain.  She does have a history of neuropathy.  She  has had no history of nonhealing wounds.  She states her symptoms in her  right leg have improved since she had angioplasty in the right  superficial femoral artery in December.   SOCIAL HISTORY:  She is married.  She has 4 children.  She quit tobacco  in 1989.   REVIEW OF SYSTEMS:  CARDIOVASCULAR:  She had no chest pain, chest  pressure, palpitations or arrhythmias.  She had no history of stroke,  TIAs or amaurosis fugax.  NEUROLOGIC:  She has had no dizziness, blackouts, headaches or seizures.   PHYSICAL EXAMINATION:  This is a pleasant 77 year old woman who appears  her stated age.  Blood pressure 149/56, heart rate is 71, saturation is 98%.  LUNGS:  Clear bilaterally to auscultation.  CARDIOVASCULAR:  I do not detect any carotid bruits.  She has a regular  rate and rhythm.  She has palpable femoral pulses although slightly  diminished.  I cannot palpate popliteal or pedal pulses on either side.  Both feet appear adequately perfused without ischemic  ulcers.  She has  hyperpigmentation bilaterally.  ABDOMEN:  Obese and difficult to assess.  She has normal pitched bowel  sounds.  NEUROLOGIC:  She has no focal weakness or paresthesias.   I have reviewed her arteriogram from December which shows that she has  markedly calcific vessels with a calcified aortoiliac system.  She has  some mild iliac narrowing on the left proximally and in the mid segment  of the common of the iliac system on the left with an occluded  hypogastric artery on the left.  She has an occlusion of her left  superficial femoral artery at the origin with reconstitution of the  popliteal artery below the knee and runoff of what appears to be the  posterior tibial artery on the left.  There is poor visualization  distally because of her proximal disease.  Of note, so this patient has  previously had vein taken from the left leg for coronary  revascularization.   I have discussed the option of proceeding with left fem-pop bypass  grafting.  I think this would have to be done with a prosthetic graft  given that she has had her vein taken from her left leg.  I looked at  her  arm vein and I do not think she is a good candidate for arm vein  bypass as the vein is quite small and thin.  Likewise I would be  reluctant to take vein from the right leg given that she has diffuse  disease on the right and could ultimately develop problems on the right  side.  I have discussed the indications for surgery and the potential  complications including but not limited to graft thrombosis, wound  healing problems and graft infection.  I have explained that we are  typically not as aggressive with bypass in patient's with claudication  but certainly I think it is reasonable if her symptoms are disabling.  She would like to consider this and let us know if she elects to proceed  with surgery.  If she does, we will need preoperative cardiac clearance  by Dr. Allyson [name].  Otherwise I will  plan on seeing her back in 6 months.  She knows to call sooner if she has problems.     [name]. [name], M.D.  Electronically Signed   CSD/MEDQ  D:  03/06/2010  T:  03/07/2010  Job:  [id number]

## 2010-07-02 NOTE — H&P (Signed)
NAME:  [name], [name]            ACCOUNT NO.:  [id number]   MEDICAL RECORD NO.:  [id number]          PATIENT TYPE:  INP   LOCATION:  1828                         FACILITY:  [facility]   PHYSICIAN:  Nanetta Batty, M.D.   DATE OF BIRTH:  [birth year]   DATE OF ADMISSION:  10/13/2007  DATE OF DISCHARGE:                              HISTORY & PHYSICAL   [name] is a 77 year old white married female patient of  Dr. Nanetta Batty, who has known coronary artery disease.  She comes to  the emergency room with chest pain.  She states it is worse than ever  before.  She has a history of coronary bypass grafting in 1989 and  cutting balloon atherectomy in October of 2003.  Her last cath was September 09, 2007.  She had no change from her prior cath of January of 2009.  She has a total SVG to RCA, a patent LIMA to her LAD, and SVG to her OM.  Medical treatment was advised.  During that hospitalization, her  troponin was elevated, but not her CPK-MBs.  Ranexa, Imdur, and  metoprolol were all increased.   She had been doing well until yesterday.  She had had several episodes  of chest pain at 2 a.m. and 4 a.m., which were relieved with  nitroglycerin.  During the day yesterday, she felt fine.  Today this  morning, she did her regular activities, felt okay.  She ate breakfast  at [employer] and then was sitting on her commode and had chest pain.  She took approximately five nitroglycerins within an hour's time.  She  had no relief, so she came to the emergency room.  Here she was found to  be in atrial fib with rapid ventricular response.  She was given a 10-mg  IV bolus of Diltiazem and started on IV Diltiazem drip at 5 mg an hour  by the ER doctor.  Heart rate went from 135 down to 114.  The chest pain  was on and off then.  In the ER, I have given her 10 more mg of IV Dilt  and increased her drip to 10 mg an hour.  Heart rate is down to the 90s  and she is feeling somewhat better, still  has some slight chest  heaviness, and also IV heparin was started.  Her EKG shows some ST  depression in anterolateral leads, with a rate of 135.   PAST MEDICAL HISTORY:  1. Coronary artery disease, as stated above.  2. Ischemic cardiomyopathy with EF approximately 45% to 50%.  3. IDDM.  4. History of PAF.  5. History of diverticulosis.  6. GERD.  7. Nephrolithiasis.  8. History of lymphoma.  9. DJD of the lumbar spine.  10.History of cholecystectomy.  11.Right hip replacement.  12.Right knee arthroplasty.  13.Hysterectomy.  14.Hypertension.  15.History of ASCVD with SFA stents.   MEDICATIONS:  1. Ranexa 1000 mg b.i.d.  2. Imdur 60 mg every day.  3. Lasix 40 mg every day.  4. Metformin 500 mg b.i.d.  5. Metoprolol 100 mg b.i.d.  6. Niaspan 500 mg at  bedtime.  7. Potassium 10 mEq every day.  8. Diltiazem hydrochloride 300 mg every day.  9. Gabapentin 400 mg t.i.d.  10.Pravastatin 40 mg at bedtime.  11.Benazepril 10 mg every day.  12.Tricor 145 mg every day.  13.Plavix 75 mg every day.  14.Humulin N 50 units in the morning and 45 units at 5 to 6 p.m.   ALLERGIES:  1. IVP DYE.  2. PENICILLIN.  3. VALIUM.   FAMILY HISTORY:  Mother died of coronary disease and diabetes at age 64.  Father died of a CVA and MI at age 87.  She had one brother who died at  age 68 of an MI.  She has no sisters and another brother who is okay.   SOCIAL HISTORY:  She is married, has four adopted children.  No tobacco,  no alcohol, and she does no driving.  She is able to do her housework  and cleaning, but she does not do as much as she used to.   REVIEW OF SYSTEMS:  She denies any pre-syncope, no syncope, no  indigestion, no heartburn, no black tarry stools.  No recent cold,  cough, fever, infection, or chills.  No unilateral weakness.  Positive  chest pain, positive palpitations, positive intermittent left arm  discomfort, chronic shortness of breath, intermittent lower extremity  edema.   Poor diet and has indiscretion to salt and her diabetic diet.  Other systems are negative.   PHYSICAL EXAMINATION:  VITAL SIGNS: Blood pressure from 94/47 to 136/65,  heart rate varying from 90 to 130, respirations 18 to 20, O2 sats with  oxygen 100%, temperature 97.5.  GENERAL:  No specific acute distress.  She looks fatigued.  HEENT:  Pupils are equal, round.  Dentures top and bottom.  Sclerae  clear.  NECK: No carotid bruits.  No JVD.  No thyromegaly.  No adenopathy.  RESPIRATORY:  She has crackles in bilateral bases.  CARDIAC:  Heart sounds irregular.  ABDOMEN:  Bowel sounds present times four.  No tenderness.  LOWER EXTREMITIES:  No edema.  Feet cool.  Poor pedal pulses.  NEURO:  Alert and oriented times three.  No focal deficits.  SKIN:  Venous insufficiency changes of bilateral legs.  MUSCULOSKELETAL:  Moves all extremities times four.   ASSESSMENT:  1. Chest pain.  2. Paroxysmal atrial fibrillation with rapid ventricular response,      last electrocardiogram from the last hospitalization, September 11, 2007, showed she was in sinus rhythm, rate of 68.  3. Known coronary artery disease, status post cardiac catheterization      on September 09, 2007.  Catheterization was stable, with no new disease.      She does have diabetic small vessels.  4. Congestive heart failure by chest x-ray, which shows pulmonary      vascular congestion.  5. Ischemic cardiomyopathy, with an ejection fraction of 45% to 50% by      catheterization August 30, 2007.  6. Hypertension.  7. Atherosclerotic cardiovascular disease.  8. Insulin-dependent diabetes mellitus.  9. Hyperlipidemia.  10.Gastroesophageal reflux disease.   PLAN:  She will be admitted to the hospital.  She is already on IV  heparin.  IV Dilt will be continued.  Will try to get her rate  controlled.  It may be that we will need to use amiodarone for this  because of her pressure being down in the 90s.  Will rule out an MI with  CK-MBs  and troponins and  she will go to coronary care unit.      [name], N.P.      Nanetta Batty, M.D.  Electronically Signed    BB/MEDQ  D:  10/13/2007  T:  10/13/2007  Job:  [id number]

## 2010-07-05 NOTE — H&P (Signed)
NAME:  [name], [name]            ACCOUNT NO.:  [id number]   MEDICAL RECORD NO.:  [id number]          PATIENT TYPE:   LOCATION:                                 FACILITY:   PHYSICIAN:  Ollen Gross, M.D.    DATE OF BIRTH:  [birth year]   DATE OF ADMISSION:  07/15/2006  DATE OF DISCHARGE:                              HISTORY & PHYSICAL   CHIEF COMPLAINT:  Right knee pain.   HISTORY OF PRESENT ILLNESS:  The patient is 77 year old female, well-  known Dr. Ollen Gross, who has previously undergone a right total hip  back in 2004, but unfortunately her knee on the right side as been  problematic and progressive in nature.  She has been treated  preoperatively with conservative surgery measures including injections.  Despite conservative measures, she continues to have pain.  It is felt  she has reached the point where she would benefit from her knee  replacement.  Risks and benefits have been discussed and she elects to  proceed with surgery.   ALLERGIES:  VALIUM, PENICILLIN, X-RAY DYE.   CURRENT MEDICATIONS:  1. Ecotrin.  2. Aspirin stopped prior to surgery.  3. Glucophage.  4. Humulin N.  5. Imdur.  6. Lasix.  7. Lopid.  8. Lotensin.  9. Lumagen eye drops.  10.Plavix stopped prior to surgery.  11.Prevacid.  12.Neurontin.  13.Norvasc.  14.Potassium.   PAST MEDICAL HISTORY:  History of bronchitis, hypertension, coronary  arterial disease, history of myocardial infarction in October 2007,  history of congestive heart failure, peripheral vascular disease, reflux  disease, diverticulosis, urinary incontinence, insulin dependent  diabetes mellitus, history of renal calculi, history of lymphoma, lumbar  degenerative disk disease, surgical menopause, ovarian cystic disease,  history of migraines.  History of atrial fibrillation.  History of  postoperative electrolyte imbalance including hypokalemia, hyponatremia  with previous hip surgery.   PAST SURGICAL HISTORY:  Right  knee scope, right hip replacement,  gallbladder surgery, bypass surgery, hysterectomy, ovarian surgery.   SOCIAL HISTORY:  Married, tired nonsmoker.  No alcohol.  Four children.   FAMILY HISTORY:  Mother with history of heart disease, hypertension,  diabetes and  breast cancer along with arthritis.  Father with history  of heart disease, hypertension, diabetes and stroke.  Both parents are  deceased.   REVIEW OF SYSTEMS:  GENERAL:  No fevers, chills or night sweats.  NEURO:  No seizures, syncope or paralysis.  RESPIRATORY:  A little bit of  shortness breath on exertion.  No shortness of breath at rest.  No  productive cough or hemoptysis.  CARDIOVASCULAR: History of chest pain  in the past.  Denies any chest pain, angina, orthopnea at this time.  GI:  No nausea and diarrhea, constipation.  GU: Some frequency.  No  dysuria.  MUSCULOSKELETAL:  Right knee.   PHYSICAL EXAM:  VITAL SIGNS: Pulse 76, respirations 12, blood pressure  150/82.  GENERAL: A 77 year old white female well-nourished, well-developed, no  acute distress.  Alert and  oriented, cooperative.  HEENT: Normocephalic, atraumatic.  Pupils are equal, round and reactive.  Oropharynx clear.  EOM's intact.  NECK:  Supple.  CHEST:  Clear.  Heart: Regular rate and rhythm.  No murmur, S1, S2.  ABDOMEN: Soft, nontender.  Bowel sounds present, round abdomen.  BREASTS/GENITALIA:  Not done.  EXTREMITIES:  Right knee range shows range of motion from 5 to 120, no  instability.  No effusion.  Crepitus noted.   IMPRESSION:  1. Osteoarthritis right knee.  2. History of bronchitis.  3. Hypertension.  4. Coronary arterial disease.  5. History of myocardial infarction.  6. History of congestive heart failure.  7. Peripheral vascular disease.  8. Reflux disease.  9. Diverticulosis.  10.History of urinary incontinence.  11.History of renal calculi.  12.Insulin dependent diabetes mellitus.  13.History of lymphoma.  14.Lumbar  degenerative disk disease.  15.Surgical menopause.  16.Ovarian cyst disease.  17.History of migraines.  18.History of atrial fibrillation per patient.   PLAN:  The patient was admitted to [facility] to undergo right  total knee replacement arthroplasty.  Surgery will be performed by Dr.  Ollen Gross.  Her cardiologist Dr. Nanetta Batty will be notified of  the admission and will be asked to assist with cardiac assistance with  the patient in the postoperative period.      Alexzandrew L. Perkins, P.A.C.      Ollen Gross, M.D.  Electronically Signed    ALP/MEDQ  D:  07/14/2006  T:  07/15/2006  Job:  [id number]   cc:   Ollen Gross, M.D.  Fax: [phone]   Loma Sender  Fax: [phone]   Nanetta Batty, M.D.  Fax: [phone]

## 2010-07-05 NOTE — Consult Note (Signed)
NAMEMarland Kitchen  [name], [name]            ACCOUNT NO.:  [id number]   MEDICAL RECORD NO.:  [id number]          PATIENT TYPE:  INP   LOCATION:  4731                         FACILITY:  [facility]   PHYSICIAN:  Reather Littler, M.D.       DATE OF BIRTH:  Jun 15, 1931   DATE OF CONSULTATION:  12/06/2005  DATE OF DISCHARGE:                                   CONSULTATION   HISTORY:  This is a 77 year old lady who has had diabetes since 1989.  The  patient says that she was initially treated with oral hypoglycemics, but 3-4  years ago was switched to insulin because of poor control.  She claims that  her blood sugars are between 80 and 120 in the morning and about 180 after  supper, but she is not sure what her hemoglobin A1c has been in the past.  She checks her sugar twice a day with an Accu-Chek.   She says that her weight has been stable around 150 pounds, but she does not  exercise.  She says she does not eat a lot of meat.  The patient says that  she has not been tried on any other kind of insulin.  She also takes  Glucotrol.   The patient appears to have blood sugar around 400 in the hospital since  admission yesterday, and hemoglobin A1c of 10.8%.   PAST HISTORY:  Coronary artery bypass in 1989, stenting 2003.   FAMILY HISTORY:  Positive for diabetes in both parents and also for coronary  disease.   PERSONAL HISTORY:  She is not a smoker.  No alcohol abuse.   ALLERGIES:  VALIUM, PENICILLIN AND IVP CONTRAST DYE.   MEDICATIONS:  As per chart.   REVIEW OF SYSTEMS:  She has had hypertension.  She has had a history of high  cholesterol, mostly triglycerides.  She has had a history of numbness and  burning pains in her toes.  She also appears to have mild leg weakness, did  not have any GI symptoms.  She has had glaucoma and has been seen by  ophthalmologist every 6 months and has no retinopathy.  She has no history  of edema, does not complain of any asthma or shortness of breath.  No  history of  TIA-like symptoms, of numbness or weakness unilaterally.   PHYSICAL EXAMINATION:  GENERAL:  The patient is short and obese.  VITAL SIGNS:  Her blood pressure is 130/70, pulse is 68.  Dental exam is  normal.  Fundi not examined.  NECK:  Exam is normal.  No carotid bruits.  No mass or thyroid enlargement.  No carotid bruits.  HEART:  Sounds normal.  LUNGS:  Clear.  ABDOMEN:  No distension, mass or tenderness.  EXTREMITY:  She has some muscle wasting in her legs.  She has decreased  sensation of her toes, absent ankle jerks.  Upper extremity jerks are  normal.  SKIN:  Slightly dry.   ASSESSMENT:  The patient has poor control of diabetes with her NPH insulin.  Currently, she is taking a total of 95 units without much control, so we  will switch her to Lantus and NovoLog, with a total of 80 of Lantus and at  least 45 of NovoLog a day and adjust insulin accordingly. I agree with the  addition of Actos and will also check TSH because of history of lymphoma and  neck radiation about a year or two ago.   Thank you for the consultation.           ______________________________  Reather Littler, M.D.     AK/MEDQ  D:  12/06/2005  T:  12/07/2005  Job:  [id number]   cc:   Morton Stall, M.D.

## 2010-07-05 NOTE — Procedures (Signed)
. [facility]  Patient:    [name], [name]                   MRN: [id number] Proc. Date: 06/14/99 Adm. Date:  [id number] Attending:  Thyra Breed CC:         Philips J. Montez Morita, M.D.             Loma Sender, M.D., [place]                           Procedure Report  PROCEDURE:  Lumbar epidural steroid injection.  DIAGNOSIS:  Lumbar spinal stenosis, ______ arthrosis and degenerative disk disease.  HISTORY:  Shameika is a 77 year old, who is well-known to [place] with lumbar spinal stenosis, lumbar spondylosis and degenerative disk disease, who has done well with epidural steroid injections in the past.  Approximately 2-3 weeks ago, she developed increasing numbness and pain down into her leg, which she noted was not brought on by any particular activity.  She has noted that it has eased up somewhat.  She went off of her Plavix approximately 13 days ago.  She is not taking any aspirin as well.  She denies any bowel or bladder incontinence or any weakness.  CURRENT MEDICATIONS:  Imdur 120 mg per day, Toprol 100 mg per day, Norvasc 10 mg per day, Glucotrol 10 mg per day, Lotensin 20 mg per day, Lasix 40 mg per day, Loped 600 mg b.i.d., Premarin 0.625 mg per day, Celebrex, Protonix, Humulin, Lipitor and ______.  Her blood sugar this morning was 168.  EXAMINATION:  Blood pressure was 174/67, heart rate 64, respiratory rate 14, O2 saturations 97%, pain level was 9.5 out of 10 and temperature was 97.2. Her straight leg raise signs were negative today.  Her deep tendon reflexes in the lower extremities were symmetric.  PROCEDURE:  After informed consent was obtained, the patient was placed in the sitting position and monitored.  Her back was prepped with Betadine x 3.  A skin wheal was raised at the L4-5 interspace with 1% lidocaine.  A 20-gauge Tuohy needle was introduced into the lumbar epidural space to a loss of resistance to  preservative-free normal saline.  There was no CSF nor blood. Medrol 80 mg and 10 mL preservative-free normal saline was gently injected. The needle was flushed with preservative-free normal saline and removed intact. POSTPROCEDURAL CONDITION:  Stable.  DISCHARGE INSTRUCTIONS: 1. Resume previous diet. 2. Follow blood sugars closely. 3. Limitation in activities per instruction sheet. 4. Continue on current medications with the exception of her remaining off Plavix and aspirin. 5. Follow up with me in 7-10 days for second injection. DD:  06/14/99 TD:  06/14/99 Job: [id number]

## 2010-07-05 NOTE — Discharge Summary (Signed)
NAMEMarland Kitchen  Sara, [name]            ACCOUNT NO.:  [id number]   MEDICAL RECORD NO.:  [id number]          PATIENT TYPE:  INP   LOCATION:  4731                         FACILITY:  [facility]   PHYSICIAN:  Cristy Hilts. Jacinto Halim, MD       DATE OF BIRTH:  1932-02-10   DATE OF ADMISSION:  12/05/2005  DATE OF DISCHARGE:  12/09/2005                               DISCHARGE SUMMARY   DISCHARGE DIAGNOSIS:  1. Non-ST elevation myocardial infarction.      a.     Chest pain.      b.     Coronary artery disease, now with graft dysfunction of the       right coronary artery.  2. Atrial fibrillation with rapid ventricular response, now converted      to sinus rhythm.  3. Uncontrolled diabetes mellitus 2, with improved control at      discharge.  4. Peripheral vascular disease.  5. Hypertension.  6. Dyslipidemia.  7. History of coronary artery bypass grafting previously and previous      stent to the left anterior descending.  8. Abnormal EKG.   DISCHARGE CONDITION:  Improved.   PROCEDURES:  Combined left heart cath with graft visualization by Dr.  Tresa Endo on December 08, 2005.   DISCHARGE MEDICATIONS:  1. Isosorbide mononitrate 120 mg daily.  2. Toprol XL 100 daily.  3. Plavix 75 mg daily.  4. Gemfibrozil 600 mg before morning and evening meal.  5. Premarin 0.625 mg daily.  6. Prevacid 30 mg daily.  7. No metformin until December 11, 2005 and then twice a day.  8. Neurontin 400 mg three times a day.  9. Aspirin 81 mg daily.  10.K-Dur 10 mEq daily.  11.Luvasa twice a day.  12.Cardizem CD 240 daily.  13.NovoLog 25 units every morning.  14.Lasix 40 mg daily.  15.Crestor 10 mg daily.  16.Lantus 45 units twice a day.  17.NovoLog 20 units with lunch and dinner.  18.Actos 30 mg daily.  19.Lotensin 20 mg daily.  (Stop Humulin N NPH insulin.  Stop Glucotrol.  Stop Norvasc.)   DISCHARGE INSTRUCTIONS:  1. Low-fat, low-salt diabetic diet.  2. Increase activity slowly.  No driving for 2 days, no lifting for 2      days.  3. Follow up with Dr. Allyson [name] on December 23, 2005 at 11:45.  4. Follow up with Dr. Lucianne Muss and Dr. Vear Clock; call then for an      appointment in 2-3 weeks.   HISTORY OF PRESENT ILLNESS:  A 77 year old female cardiology patient of  Dr. Nanetta Batty who was admitted to [facility] on December 06, 2005.  After washing dishes, she began to experience midsternal chest  pain, pressure, heaviness like something was sitting on her chest.  It  was also associated with shortness of breath and nausea.  She called  EMS.  On arrival, EMS noted the patient to be in atrial fibrillation  with rates of 150-160 and persistent chest pain unrelieved with  sublingual nitroglycerin.  Was diverted to [facility] for evaluation.  She received further sublingual nitroglycerin,  IV  Lopressor, IV Cardizem and heparin.  Her pain subsided.  Cardiac  markers were negative.  The rate was controlled, and she was transported  to [facility].  EKG continued in atrial fibrillation.  Heart rate had  gone back up to 165 with lateral ST depression and evidence of old  inferior MI.  By the time she actually reached [facility], she was in  sinus rhythm with a rate of 68 with T wave inversions in I,  AVL and old  inferior MI.   She was admitted to [facility] on IV heparin and nitroglycerin and with  further evaluation to continue.   PAST MEDICAL HISTORY:  1. Coronary disease status post CABG with LIMA to the LAD, saphenous      vein graft to the marginal, saphenous vein graft to the RCA.  In      2003, she had an occluded saphenous vein graft to RCA, occluded      saphenous vein graft to the OM, and 90% stenosis to the distal LAD      beyond the insertion of the graft site.  In 2003, she had a      __________ angioplasty and stent to the LAD.  EF at that time was      50%.  2. Peripheral vascular disease with bilateral femoral artery stenting      in 2000 and stent to the left SFA in 2001.  She  had abnormal ABIs      in July of 2007, but no intervention secondary to finances.  3. She has diabetes mellitus and is on insulin.  4. Dyslipidemia.  5. Hypertension.  6. Peripheral neuropathy.  7. Right knee injury after surgery.   FAMILY HISTORY/SOCIAL HISTORY/REVIEW OF SYSTEMS:  See H&P.   OUTPATIENT MEDICATIONS:  1. Imdur 20 daily.  2. Toprol XL 100.  3. Plavix 75.  4. Glucotrol XL 10.  5. Lotensin 20.  6. Lasix 40.  7. Lipid 600 b.i.d.  8. Premarin 0.625.  9. Norvasc 10.  10.Prevacid 30.  11.Neurontin 400 b.i.d.  12.Humulin N 45 in the morning, 25 in the evening.  13.Aspirin daily.  14.Klor-Con 10 mEq daily.   PHYSICAL EXAMINATION AT DISCHARGE:  VITAL SIGNS:  Blood pressure 123/66,  pulse 72, respirations 22, oxygen saturation 95%.  Accu-Cheks ran 272 to  387.  Temperature 97.6.  RIGHT GROIN:  Stable.  LUNGS:  Clear.  HEART:  Regular rate and rhythm   Cardiac cath report - please see dictated report.  It was decided  medical therapy and consideration for a EECP treatment.  Dr. Tresa Endo  recommended Renexa at the end of the cath, but this was not started.  He  did not order it, and Dr. Alanda Amass saw the patient the next day.  The  patient was stable, and the question was could the atrial fibrillation  have caused the angina versus the other way around.  Therefore, Renexa  and had not started.  It will be started as an outpatient if needed.   LABORATORY DATA:  Hemoglobin 13, hematocrit 37.2, WBC 9.5, platelets  257.  At discharge, hemoglobin 11.4, hematocrit 33.3, WBCs 13.6,  platelets 232.   Pro time 14.1, INR 1.1, PTT 37.   Chemistry - sodium 137, potassium 3.9, chloride 104, CO2 of 25, glucose  450, BUN 19, creatinine 0.8, calcium 9.1, total protein 5.6, albumin  3.0.  AST 20, ALT 13, ALP 107.  Total bili 0.6, magnesium 2.1.  Glucose  at the time of  discharge was down to 195 and BUN 30.  Glycohemoglobin 10.8.   Cardiac enzymes - CKs of 54, 44, 27 and 30.  MBs  - the peak was 6.2,  5.3, 2.9 and 2.  The troponin I peak was 0.50.  Other numbers were 0.4  and 0.31.  Minimal non-ST-elevation MI, and this possibly could have  been related to her atrial fibrillation.  Lipid panel - total  cholesterol 141, triglycerides 258, HDL 27 and LDL 64.   TSH 3.850.   CHEST X-RAY:  Cardiac enlargement, mild vascular congestion, but no  pulmonary edema.  Right basilar density may reflect an early infiltrate  or atelectasis, and that was on December 06, 2005.   ELECTROCARDIOGRAMS:  Initial EKG revealed sinus rhythm, inferior  infarction age undetermined, abnormal new ST wave abnormality; consider  lateral ischemia, slightly less prominent.  Followup on the 22nd at  12:35 a.m. revealed sinus rhythm with PVCs, nonspecific T-wave  abnormality, abnormal EKG, and later on the same day frequent PVCs,  lateral ischemia.   No acute changes were seen on those EKGs.   HOSPITAL COURSE:  [name] was admitted on December 06, 2005 by Dr.  Yates Decamp after presenting to [facility] with chest pain and rapid atrial  fibrillation.  She was started on Cardizem, and by the time she arrived  at [facility], she was in sinus rhythm.  It was felt it could be ischemic  etiology causing her atrial fibrillation.  Troponins were mildly  elevated.  Her diabetes was found to be uncontrolled with glucoses in  the 400s, and she had dyslipidemia, and also a known history of coronary  disease as previously stated.  Initially, started IV heparin was changed  to Lovenox.  Crestor and Lavaza were started, as well.   Diabetic consult with Dr. Lucianne Muss was obtained with great assistance on  her medications to control her diabetes.   By December 07, 2005, she was stable, would undergo cardiac  catheterization the next morning, which she did, and results as  previously stated.   By December 09, 2005, she was stable, ambulating without problems in the  hall, and would follow up with primary care, as well  as Dr. Allyson [name] in the  office.  She was ambulating without problems.  EECP will be discussed  with Dr. Allyson [name] in the office, as well as for Renexa use if needed.      Darcella Gasman. [place], N.P.      Cristy Hilts. Jacinto Halim, MD  Electronically Signed    LRI/MEDQ  D:  02/03/2006  T:  02/03/2006  Job:  [id number]   cc:   Reather Littler, M.D.  Loma Sender

## 2010-07-05 NOTE — Discharge Summary (Signed)
[name]  KEYUNA, CUTHRELL            ACCOUNT NO.:  [id number]   MEDICAL RECORD NO.:  [id number]          PATIENT TYPE:  INP   LOCATION:  3705                         FACILITY:  [facility]   PHYSICIAN:  Cristy Hilts. Jacinto Halim, MD       DATE OF BIRTH:  [birth year]   DATE OF ADMISSION:  02/21/2007  DATE OF DISCHARGE:  02/24/2007                               DISCHARGE SUMMARY   REASON FOR ADMISSION:  [name] is a 77 year old female  patient with known coronary artery disease.  She had a CABG in 1989.  She has had caths by Dr. Tresa Endo with a history of RCA occluded SVG to RCA  totaled and with medical treatment in the past.  She was admitted with  chest pain and plans for cath.   HISTORY:  She had did have a non-ST elevation MI. She also had some mild  pulmonary edema by chest x-ray.  However, her BNP was only 358. She  underwent cardiac catheterization on February 22, 2007 which revealed SVG  to the RCA was total.  Her native was also total.  Her circumflex was  negative, was total. Her SVG to her circumflex was patent, and her LIMA  to her LAD was patent. Dr. Allyson [name] felt her anatomy was not changed since  her last cath in March 2007, and he recommended medical treatment and to  start Ranexa.  Her EF was 45%.  She did have some iliac disease 40% and  60% in her left iliac. She did have some further chest pain treated with  nitroglycerin. She was started on her Ranexa. Her Lopid was discontinued  because of the Ranexa. We have also added back some Lotensin.  And we  added a generic statin for affordability. On February 24, 2007 she was  seen by Dr. Jacinto Halim and considered stable for discharge home.  Her blood  pressure was 105/71.  She still had some chest heaviness but no real  chest pain. She was tolerating her Ranexa. Her QTC was 483.  She had  diuresed about 2300 since admission.   DISCHARGE MEDICATIONS:  Diltiazem 240 mg a day, furosemide 40 mg daily  in a.m., potassium chloride 10 mEq every day.   Gabapentin 400 mg two  times per day, fenofibrate 145 mg daily, aspirin 81 mg two per day,  Niaspan 500 mg at bedtime, Plavix 75 mg a day, Ranexa 500 mg twice per  day, benazepril 10 mg once a day, pravastatin 40 mg at bedtime,  isosorbide mononitrate 9 mg a day.  Humulin N 15 in the morning and 45  units in p.m., metformin 500 mg twice per day, Lumigan eye drops as  usual, Prevacid 30 mg a day, nitroglycerin p.r.n.   FOLLOW UP:  She will follow up with Dr. Allyson [name] on March 10, 2007.   ACTIVITY:  She should do no strenuous lifting, pushing, or pulling for 4  days.   DIET:  And she should be on a diabetic heart-healthy diet.   LABS:  Hemoglobin was 10.2, hematocrit 29.5, WBCs 11.8, platelets were  246.  Sodium 134, potassium 4.2, BUN  42, creatinine 1.37, glucose was  244. Her hemoglobin was 8.4, CK-MB #1 was 73/4.3, troponin of 0.17.  Number 2 was 75/4.6, troponin of 0.31. Number 3 was 62/3.1, troponin of  0.33. Number 49/2.1.  Her BNP was 358, 359, and 128. On February 24, 2007  lipid profile total cholesterol is 158, triglycerides 236, HDL 22, LDL  was 89.   DISCHARGE DIAGNOSES:  1. Unstable angina status post cath with coronary disease, nothing      amenable for PCI. No change in her cath since last cath in 11/2005.      Ranexa added for her angina.  2. Coronary artery disease with history of coronary bypass grafting      and progressive disease.  3. Mild congestive heart failure.  4. Ejection fraction of 45%, ischemic cardiomyopathy.  5. Insulin-dependent diabetes mellitus, not well controlled.  6. Acute on chronic systolic congestive heart failure, mild.  7. Hypertension.  8. Dyslipidemia.      [name], N.P.      Cristy Hilts. Jacinto Halim, MD  Electronically Signed    BB/MEDQ  D:  03/08/2007  T:  03/09/2007  Job:  [id number]   cc:   Cristy Hilts. Jacinto Halim, MD  Loma Sender

## 2010-07-05 NOTE — Cardiovascular Report (Signed)
NAME:  [name]:  [id number]   MEDICAL RECORD NO.:  [id number]          PATIENT TYPE:  INP   LOCATION:  4731                         FACILITY:  [facility]   PHYSICIAN:  Nicki Guadalajara, M.D.     DATE OF BIRTH:  12/17/1931   DATE OF PROCEDURE:  12/08/2005  DATE OF DISCHARGE:                              CARDIAC CATHETERIZATION   INDICATIONS:  [name] is a 77 year old patient of Dr. Allyson [name] who  is status post prior CABG surgery with a LIMA to her LAD, vein to her  marginal, and vein to her right coronary artery in 1989.  She has documented  occlusion of her graft to her right coronary artery.  The last  catheterization was October 2003 which, again, showed an occluded graft to  her RCA and a total native RCA with faint right-to-right left-to-right  collaterals.  She was found to have a patent graft to her obtuse marginal  one vessel with 20% smooth tapering; and evidence for a 95% ostial stenosis  in origin of the OM-2 vessel.  Her LIMA was patent, but she had a 90% focal  stenosis beyond the anastomosis for which she underwent successful cutting  balloon arthrotomy/PTCA, and ultimate stenting with a 2.25 x 12 mm Express  II stent dilated to 2.3 mm.  She did well.  Recently she has developed  recurrent chest pain leading to her hospitalization, yesterday.  She is  referred for cardiac catheterization.   PROCEDURE:  After premedication with Versed 1 mg IV x2 she was prepped and  draped in the usual fashion.  Her right femoral artery was punctured  anteriorly.  This was significantly calcified.  A 5-French sheath was  inserted.  Diagnostic catheterization was done with 5-French Judkins 4 left  and right coronary catheters.  A right catheter was used for selective  angiography into the vein grafts.  The LIMA catheter was used for selective  angiography into the left internal mammary artery.  A 5-French pigtail  catheter was used for biplane selective  left arteriography.  Distal  aortography was also performed with a 5-French pigtail catheter, as well an  additional injection above the iliac bifurcation in light of her known  peripheral vascular disease for more optimal aortobifemoral runoff.  Scout  angiography confirmed an excellent angiographic result.  She tolerated the  procedure well.   HEMODYNAMIC DATA:  Central aortic pressure was 163/80, mean 115, and left  ventricle pressure is 163/18, post A-wave 23.  During the procedure, the  patient also received 200 mcg of intracoronary nitroglycerin down her LIMA  graft.   ANGIOGRAPHIC DATA:  1. Coronary arteries are extensively calcified.  Left main coronary artery      gave rise to an LAD and left circumflex system.  2. The LAD, again, was significantly calcified; gave rise to a proximal      bifurcating septal perforating artery, and a bifurcating diagonal      vessel.  The LAD was occluded after the diagonal and septal takeoff.      The diagonal vessel was small-caliber and had diffuse 70% proximal  stenosis and diffuse 95% distal stenosis in a very small caliber      portion of the vessel.  3. The circumflex vessel was totally occluded just beyond the ostium and,      again, was heavily calcified.  4. The right coronary was totally occluded proximally.  There was faint      right-to-right collaterals as a result of conus nodal branch.  5. The vein graft to the right coronary artery was totally occluded at its      origin; and this was old.  6. The vein graft supplying the circumflex marginal one vessel had mild      luminal irregularity with narrowing of 20% proximally.  This vessel      anastomosed into the OM-1 vessel just beyond a 95% stenosis, extending      to the AV groove branch.  Distal to the graft, there was a 20%      narrowing, but the OM-1 vessel was large caliber.  The entire AV groove      circumflex filled retrograde from this vein graft with a 60% AV  groove      narrowing between the OM-1 and OM-2 vessel; and previously noted 95%      ostial stenosis in the OM-2 vessel.  Collateralization was evident to      the distal RCA.  Small-caliber PD PLA system.  7. The left internal mammary artery was widely patent.  There was no      restenosis at site of prior intervention.  The apical portion of the      LAD was very small-caliber; and was 80% narrowed.  This did improve      slightly with IV nitroglycerin.  Collateralization was evident to the      distal RCA via the LIMA LAD graft via septal collaterals.  8. Biplane selective arteriography revealed preserved global      contractility.  However, there was a moderate area of posterobasal to      mid hypocontractility in the RAO projection and involving the mid      posterolateral wall on the LAO projection.  Distal aortography with      aortobifemoral runoff did not show any renal artery stenosis.  There is      significant calcification of both iliac arteries with 60% diffuse      narrowing in the left iliac artery.   RECOMMENDATIONS:  Medical therapy.  Consideration for ECP treatment and  possible addition of Ranexa, in light of her anginal symptomatology may be  beneficial.           ______________________________  Nicki Guadalajara, M.D.     TK/MEDQ  D:  12/08/2005  T:  12/09/2005  Job:  [id number]   cc:   Nanetta Batty, M.D.  Nicki Guadalajara, M.D.

## 2010-07-05 NOTE — Op Note (Signed)
[facility]  Patient:    [name], [name]                   MRN: [id number] Proc. Date: 12/27/99 Adm. Date:  [id number] Disc. Date: [id number] Attending:  Thyra Breed CC:         Philips J. Montez Morita, M.D.   Operative Report  PROCEDURE:  Lumbar epidural steroid injection.  DIAGNOSIS:  Lumbar spinal stenosis with underlying lumbar spondylosis.  ANESTHESIOLOGIST:  Thyra Breed, M.D.  INTERVAL HISTORY:  The patient was doing well up until about two weeks ago when she redeveloped her low back discomfort radiating out to predominantly the left lower extremity.  She stopped her Plavix 10 days ago and presents today for a series of lumbar epidural steroid injections.  It has been since May since she had a previous set.  PHYSICAL EXAMINATION:  VITAL SIGNS:  Blood pressure 162/62, heart rate 63 respiratory rate 17, O2 saturation 98%, pain level 8/10.  NEUROLOGIC:  Grossly unchanged from her previous evaluation.  CURRENT MEDICATIONS:  1. Imdur 120 mg per day.  2. Toprol XL 100 mg daily.  3. Norvasc 10 mg.  4. Glucotrol 10 mg per day.  5. Lotensin 20 mg per day.  6. Lasix 40 mg per day.  7. Lopid 600 mg b.i.d.  8. Premarin 0.625 mg per day.  9. Celebrex 200 mg per day. 10. Protonix 40 mg per day. 11. Enteric-coated aspirin. 12. Humulin 45 units in the morning, 20 units in the evening. 13. Xalatan eye drops  She stopped her Plavix 10 days ago.  LABORATORY DATA:  Her blood sugar last night was 190.  PROCEDURE:  After informed consent was obtained, the patient was placed in the sitting position and monitored.  Her back was prepped with Betadine x 3.  A skin wheal was raised at the L4-5 inner space with 1% lidocaine.  A 20-gauge Tuohy needle was introduced in the lumbar epidural space to loss of resistance to preservative free normal saline.  There was no CSF nor blood.  Then 80 mg of Medrol and 10 ml preservative free normal saline was gently  injected.  The needle was flushed with preservative free normal saline and removed intact.  POSTPROCEDURE CONDITION:  Stable.  DISCHARGE INSTRUCTIONS: 1. Resume previous diet. 2. Limitation of activities per instruction sheet as outlined by my assistant    today. 3. Resume previous medications but remain off the Plavix. 4. Follow up with me in one week for repeat injection. 5. The patient was encouraged to follow her blood sugars closely. DD:  12/27/99 TD:  12/27/99 Job: [id number]

## 2010-07-05 NOTE — H&P (Signed)
[place]  Patient:    [name], [name]                   MRN: [id number] Adm. Date:  [id number] Attending:  Berry, Jonathan [place] CC:         Runell Gess, M.D.             Loma Sender, M.D. - [place], [place]                         History and Physical  DATE OF BIRTH:  1931-08-23  CHIEF COMPLAINT:  Chest pain.  HISTORY OF PRESENT ILLNESS:  This 77 year old woman, a cardiac patient of Dr. Kenn File, and a medical patient of Dr. Loma Sender, in [place], presented to the emergency room with chest pain of two hours duration.  She had developed the chest pain spontaneously, and described it as  heavy feeling, associated with smothering, associated with nausea, and a feeling of dizziness at one point, lasting for several hours, until finally relieved with oxygen in the emergency room.  She is noted to have more marked ST segment abnormality on her electrocardiogram tonight, than was the case when she last went home, and is admitted for further evaluation.  PAST MEDICAL HISTORY:  A long history of coronary artery disease.  She had aortocoronary bypass grafting at [facility] in 1989.  Subsequently she stopped smoking. 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  She subsequently has had three interventional procedures on her left leg, and still has significant claudication.  She was most recently in the hospital with a discharge on March 29, 1999, an admission during which she had a cardiac catheterization which revealed the following findings: The left main was normal.  The LAD was totally occluded after the first septal perforator and diagonal branch.  The first diagonal branch was a small diffusely diseased vessel, with segmental mid-80% and distal 90%  stenoses.  The left circumflex was totally occluded at its origin.  The right coronary was occluded  proximally, and filled distally by both right to right and left to right collaterals.  There was an occluded vein graft to the right coronary artery, and there was a vein graft to the circumflex marginal, which showed a 40% aorto-ostial stenosis, a 50% insertion stenosis, and a 50%-60% stenosis in the OM branch, just distal to the vein graft insertion.  There was also a 99% stenosis in the proximal OM, proximal to the vein graft insertion, and a 90% stenosis in both posterolateral branches, which were compromised and received no direct circulation.  The left ventriculography revealed an ejection fraction of approximately 50%, with moderate inferobasal hypokinesis.  Other past medical and surgical history includes a hysterectomy about 20 years go, an ovarian wedge procedure, an appendectomy, and a cholecystectomy.  CURRENT MEDICATIONS ON WHICH SHE WAS DISCHARGED MOST RECENTLY, AND WHICH HAVE NOT BEEN CHANGED SUBSEQUENTLY:  1. Imdur 90 mg q.d.  2. Norvasc 5 mg q.d.  3. Toprol XL 50 mg q.d.  4. Glucotrol XL 10 mg q.d.  5. Lotensin 20 mg q.d.  6. Lasix 40 mg q.d.  7. Lopid 600 mg b.i.d.  8. Premarin 0.625 mg q.d.  9. Celebrex 200 mg q.d. 10. Enteric-coated aspirin 325 mg q.d. 11. Humulin-N 25 units  q.a.m., 20 units q.p.m. 12. Protonix 40 mg q.d. 13. Xalatan drops in her eyes. 14. Plavix 75 mg q.d. 15. Nitroglycerin.  FAMILY HISTORY:  Her mother is age 55, and in failing health.  She has diabetes  mellitus and had bypass surgery at about age 43.  Her father died of heart trouble at age 9.  SOCIAL HISTORY:  She has been married for 43 years.  She and her husband have four adopted children.  They are now raising a granddaughter who at age 107, has just  started nursing training.  Her history of quitting smoking is noted above, as is her work history.  REVIEW OF  SYSTEMS:  Constitutional:  She denies fevers, chills, or sweats. Eyes: No diplopia or blurring.  ENT:  No deafness, or tinnitus.  Her dizziness is associated with her chest pain tonight, as mentioned above.  Cardiovascular: See the history of present illness.  She denies PND or orthopnea.  Respiratory: She has a rare cough.  She has not had any bothersome cough recently.  GI:  She had  some symptoms suggesting a hiatal hernia or reflux.  GU:  She has frequency, urgency, incontinence, and nocturia.  Musculoskeletal:  She has joint pain and joint swelling.  She can walk 100 yards or less without having to stop because f claudication.  Skin/Breasts:  She still has an ecchymosis from her recent cardiac catheterization.  She has occasional mild edema.  Neurologic:  There is a history of headache, and she has symptoms of diabetic peripheral neuropathy, with stinging and pain in her feet at night.  Psychiatric:  No overt depression or hallucinations.  Heme/Lymph:  No easy bruising or nodule.  Allergic/Lymphatic: She is intolerant of PENICILLIN, VALIUM, AND CONTRAST MATERIAL.  ALLERGIES:  PENICILLIN, VALIUM, AND CONTRAST MATERIAL.  PHYSICAL EXAMINATION:  VITAL SIGNS:  Blood pressure 158/80, pulse 104 and regular, respirations 20. There are also occasional PVCs.  After being in the emergency room for an hour, her blood pressure was down to 130/76, pulse 76.  GENERAL:  She is a well-developed, well-nourished, moderately-overweight, middle-aged woman who looks probably somewhat older than her stated age of 77. Her mood and affect suggest mild anxiety.  HEENT:  Conjunctivae are intact.  Her lids reveal no xanthelasma or icterus. She has her own teeth in the lower jaw and they are in fair repair, and a denture in the upper.  The oral mucosa reveals no pallor or cyanosis.  NECK:  Supple and symmetrical.  The trachea is midline and mobile.  There is no  palpable cervical node.  The  thyroid is palpable and perhaps minimally enlarged. There is no definite nodule.  I hear no carotid bruit.   LUNGS:  Respiratory effort is normal.  Her lungs are clear to auscultation and percussion.  Forced expiratory time is not prolonged.  BACK:  Straight and nontender.  NEUROLOGIC:  Her gait is not tested.  By history she could not undergo a stress  test.  Muscle strength and tone are appropriate for her age and symmetrical.  CARDIAC:  The cardiac apical impulse is cryptic.  The left border of cardiac dullness is within the left anterior axillary line.  There is on palpable gallop sound.  Her heart rhythm is regular, with occasional premature beats.  There is no gallop, click, or murmur.  EXTREMITIES:  Her digits and nails reveal no clubbing or cyanosis.  SKIN/SUBCUTANEOUS TISSUE:  Reveal no stasis dermatitis or ulcer.  ABDOMEN:  Mildly obese with  no palpable enlargement of the liver or spleen. The abdominal aorta is not palpable, and there is no bruit.  EXTREMITIES:  There is no definite femoral bruit.  The right femoral artery is palpable and the left is not.  The pedal pulses reveal palpable dorsalis pedis nd posterior tibial pulses on the right.  On the left the dorsalis pedis pulse is palpable and the posterior tibial is not.  Her ankles reveal trace edema.  AVAILABLE LABORATORY DATA:  Include an electrocardiogram which shows diffuse ST  abnormalities, noted particularly in leads V4 through V6, and aVL, lead I, and ead II.  There is no significant ST elevation.  The ST depression is slightly more marked and widespread than was the case at the time of the previous hospital admission.  DISPOSITION:  The patient is admitted with prolonged angina.  The first CPK is 3. She will be placed on a nitroglycerin drip and enoxaparin, and further decisions about a possible repeat cardiac catheterization can be pursued subsequently. DD:  04/09/99 TD:  04/10/99 Job:  [id number]

## 2010-07-05 NOTE — Procedures (Signed)
[facility]  Patient:    [name], [name]                   MRN: [id number] Proc. Date: 06/26/99 Adm. Date:  [id number] Attending:  Thyra Breed CC:         Philips J. Montez Morita, M.D.                           Procedure Report  PROCEDURE:                    Lumbar epidural steroid injection.  DIAGNOSIS:                    Lumbar spinal stenosis with underlying facet                               joint arthritis and degenerative disk disease.  INTERVAL HISTORY:  The patient has noted a modest improvement overall from her injections.  She is here for her third injection today.  INDICATIONS:  Indications are unchanged.  EXAMINATION:  Blood pressure is 152/66, heart rate 68, respirations 14, O2 saturation is 98%.  Pain level is 5 out of 10, and temperature is 97 degrees. Back demonstrates good healing from previous injection site.  Her neurologic exam is grossly unchanged.  DESCRIPTION OF PROCEDURE:  After informed consent was obtained, the patient was placed in a sitting position and monitored.  Her back was prepped with Betadine x 3.  A skin wheal was raised at the L4-5 interspace with 1% lidocaine.  A 20 gauge Tuohy needle was introduced in the lumbar epidural space with loss of resistance to preservative-free normal saline.  There was no CSF nor blood.  Medrol 80 mg in 10 ml of preservative-free normal saline was gently injected.  The needle was flushed with preservative-free normal saline and removed intact.  POSTPROCEDURE CONDITION:  Stable.  DISCHARGE INSTRUCTIONS: 1. Resume previous diet. 2. Limitation of activities per instruction sheet. 3. Continue current medications. 4. Follow up with me in six months if approved by Dr. Montez Morita for another    series of lumbar epidural steroid injections. DD:  06/26/99 TD:  06/27/99 Job: [id number]

## 2010-07-05 NOTE — Op Note (Signed)
NAME:  [name], [name]            ACCOUNT NO.:  [id number]   MEDICAL RECORD NO.:  [id number]          PATIENT TYPE:  AMB   LOCATION:  DAY                          FACILITY:  [facility]   PHYSICIAN:  Ollen Gross, M.D.    DATE OF BIRTH:  1931-07-20   DATE OF PROCEDURE:  02/19/2006  DATE OF DISCHARGE:                               OPERATIVE REPORT   PREOPERATIVE DIAGNOSIS:  Right knee medial meniscal tear.   POSTOPERATIVE DIAGNOSIS:  Right knee medial meniscal tear, chondral  defect medial compartment.   PROCEDURE:  Right knee arthroscopy with medial meniscal debridement and  chondroplasty.   SURGEON:  Ollen Gross, M.D., no assistant.   ANESTHESIA:  Local with MAC.   ESTIMATED BLOOD LOSS:  Minimal.   DRAIN:  None.   COMPLICATIONS:  None.   CONDITION:  Stable to recovery .   BRIEF CLINICAL NOTE:  [name] is a 77 year old female with a  several month history significant right knee pain and mechanical  symptoms.  She has a documented meniscal tear and some degenerative  change.  She presents now for arthroscopy and debridement.   PROCEDURE IN DETAIL:  The successful initiation of knee block,  tourniquet placed high on the right thigh.  Right lower extremity  prepped and draped in usual sterile fashion.  I infiltrated the portal  sites with a total of 20 mL of 1% Xylocaine.  The incisions were then  made for the superomedial and inferolateral portals.  An inflow cannula  passed superomedial and a camera passed inferolateral.  Arthroscopic  visualization proceeds.  Undersurface of the patella looks fairly  normal.  Trochlea has some chondromalacia and no full-thickness defects.  Flexion and valgus force applied to the knee and the medial compartment  was entered.  She has evidence of full-thickness cartilage loss on the  medial femoral condyle and on the medial tibial plateau there is an area  of osteochondral defect with the loose fragment on the medial most  portion of  the tibial plateau anteriorly. This is about a 1 x 1 cm area.  A spinal needle was used to localize the inferomedial portal, small  incision made, dilator placed and the meniscus debrided back to a stable  base with baskets and a 4.2 mm shaver.  I then used shaver to debride  that osteochondral defect down to a stable bony base and probed the area  and there was no further loose bone or cartilage.  The medial  compartment was again inspected.  No other unstable cartilage noted.  The intercondylar notch was visualized, ACL looks normal.  Lateral  compartment shows low grade chondromalacia, otherwise normal.  The  patellofemoral compartment again identified and there is no unstable  cartilage there.  The arthroscopic equipment was then  removed from inferior portals which were closed with interrupted 4-0  nylon.  20 mL of 0.25% Marcaine with epi were injected through the  inflow cannula and that is removed and that portal closed with nylon.  Bulky sterile dressings applied and she is awakened and transferred to  recovery in stable condition.  Ollen Gross, M.D.  Electronically Signed     FA/MEDQ  D:  02/19/2006  T:  02/19/2006  Job:  [id number]

## 2010-07-05 NOTE — Procedures (Signed)
[place]  Patient:    [name], [name]                   MRN: [id number] Proc. Date: 06/20/99 Adm. Date:  [id number] Attending:  Thyra Breed CC:         Philips J. Montez Morita, M.D.                           Procedure Report  PROCEDURE PERFORMED:  Lumbar epidural steroid injection.  ANESTHESIOLOGIST:  Thyra Breed, M.D.  DIAGNOSIS:  Lumbar spinal stenosis with facet joint arthritis and degenerative disk disease.  INTERVAL HISTORY:  The patient has noted a 50% reduction in her discomfort after her first injection.  She is interested in further injections today.  PHYSICAL EXAMINATION:  Blood pressure 124/53, heart rate 76, respiratory rate 18, oxygen saturation 98%, pain level is 5/10.  Temperature is 97.1.  Her back shows good healing from previous injection site.  Neurologic exam is grossly unchanged from previously.  DESCRIPTION OF PROCEDURE:  After informed consent was obtained, the patient was placed in sitting position and monitored.  His back was prepped with Betadine x 3.  A skin wheal was raised at the L4-5 interspace with 1% lidocaine. A 20 gauge Tuohy needle was introduced into the lumbar epidural space to a loss of resistance to preservative free normal saline.  There was no CSF or blood.  80 mg of Medrol in 10 ml of preservative free normal saline 10 ml were gently injected.  The needle was flushed with preservative free normal saline and removed intact.  Postprocedure condition was stable.  DISCHARGE INSTRUCTIONS: 1. Resume previous diet. 2. Limitations in activities per instruction sheet. 3. Continue on current medications. 4. Follow up with me next week for third injection. DD:  06/20/99 TD:  06/24/99 Job: [id number]

## 2010-07-05 NOTE — Discharge Summary (Signed)
NAMEMarland Kitchen  [name], [name]            ACCOUNT NO.:  [id number]   MEDICAL RECORD NO.:  [id number]          PATIENT TYPE:  INP   LOCATION:  1526                         FACILITY:  [facility]   PHYSICIAN:  Ollen Gross, M.D.    DATE OF BIRTH:  1931/08/25   DATE OF ADMISSION:  07/15/2006  DATE OF DISCHARGE:  07/20/2006                               DISCHARGE SUMMARY   ADMITTING DIAGNOSES:  1. Osteoarthritis, right knee.  2. History of bronchitis.  3. Hypertension.  4. Coronary arterial disease.  5. History of myocardial infarction.  6. History of congestive heart failure.  7. Peripheral vascular disease.  8. Reflux disease.  9. Diverticulosis.  10.History of urinary incontinence.  11.History of renal calculi.  12.Insulin-dependent diabetes mellitus.  13.History of lymphoma.  14.Lumbar degenerative disc disease.  15.Surgical menopause.  16.Ovarian cystic disease.  17.History of migraines.  18.History of atrial fibrillation.   DISCHARGE DIAGNOSES:  1. Osteoarthritis, right knee, status post right total knee      replacement arthroplasty.  2. Postoperative blood-loss anemia.  3. Status post transfusion, without sequelae.  4. Postoperative hyponatremia.  5. Postoperative hypokalemia.  6. Positive fluid balance postoperatively, improved.  7. History of bronchitis.  8. Hypertension.  9. Coronary arterial disease.  10.History of myocardial infarction.  11.History of congestive heart failure.  12.Peripheral vascular disease.  13.Reflux disease.  14.Diverticulosis.  15.History of urinary incontinence.  16.History of renal calculi.  17.Insulin-dependent diabetes mellitus.  18.History of lymphoma.  19.Lumbar degenerative disc disease.  20.Surgical menopause.  21.Ovarian cystic disease.  22.History of migraines.  23.History of atrial fibrillation.   PROCEDURE:  On Jul 15, 2006, right total knee.  Surgeon:  Dr. Despina Hick.  Assistant:  Avel Peace, P.A.-C.  Anesthesia:  General.   CONSULTATIONS:  Cardiology, Dr. Elsie Lincoln, covering for Dr. Allyson [name].   BRIEF HISTORY:  [name] is a 77 year old female with end-stage  arthritis, right knee, with progressively worsening pain and  dysfunction, failing nonoperative management, now presents for total  knee.   LABORATORY DATA:  Preoperative CBC showed a hemoglobin of 13.7,  hematocrit of 39.6, preoperative white count 7.7.  Postoperative  hemoglobin down to 10, drifted down to 9, and got as low as 8.7.  Given  one unit blood, bolstering hemoglobin back up  to 10.5 with a hematocrit  of 30.1.  PT and PTT preoperatively 12.9 and 29, respectively.  INR 1.0.  Serial preoperatively-times were followed.  Last PT/INR 20.8/1.7.  Chemistry panel on admission all within normal limits.  Sodium did drop  from 145, down to 133, back to 136, last noted 133.  Potassium dropped  from 4.2 to 3.1, back to 4.3.  B-natruretic peptide taken on Jul 17, 2006, was minimally elevated at 126.  Urinalysis preoperatively  negative.  Blood group type O-positive.   The EKG, dated Jul 08, 2006:  Sinus rhythm with frequent premature  ventricular complexes, infarct age undetermined, ST and T-wave  abnormality.  No significant change since Jul 08, 2006, per Dr. Jacinto Halim.  Followup EKG, Jul 16, 2006:  Sinus rhythm with occasional premature  ventricular complexes, T-wave abnormality, consider lateral ischemia,  unconfirmed.   HOSPITAL COURSE:  The patient was admitted to [facility],  tolerated procedure well, later transferred from the recovery room to  the orthopedic floor, started on PC and p.o. analgesics pain control  following surgery, given 24 hours postoperative IV antibiotics.  She had  a pretty rough night on the evening of surgery, due to pain, did not get  much sleep, was doing a little bit better on the pain control on the  morning of day #1.  She was noted to have some positive fluid balance.  Cardiology consult was called.  Patient  was seen by cardiology services.  She was given some Lasix for the positive fluid balance.  Her B type  natruretic peptide was checked due to the past history of congestive  heart failure postoperatively.  It was only minimally elevated.  She did  have a little bit low urinary output on that morning of day #1, but she  responded to Lasix and her output improved.  She was placed on a sliding  scale because of her diabetes.  Once her BUN and creatinine were okay,  we resumed her metformin, followed her electrolytes.  Her sodium did  drop postoperatively, along with her potassium.  She did receive  potassium supplements.  She was placed back on her Coumadin  postoperatively for DVT.  She was given a Lovenox bridge, due to her  history.  She was followed by cardiology for the first several days  postoperatively.  The fluid balance was probably the likely cause for  her slight temperature postoperatively.  That had improved by day #2.  She was noted to have excellent urinary output.  Dressing changed on day  #2 and the incision looked very good.  She was still hurting on the  morning of day #2, trying to get better pain control, slowly progressing  from a therapy standpoint, although she did get up and walk about 50  feet.  She was stable from a cardiac standpoint; they signed off, but  were available for any calls.   By day #3, she had a little bit of drop in her glucose, a little bit of  mild temperature.  Her hemoglobin had dropped down to 8.7.  She was  given one unit of blood.  Potassium was also low and she was given  potassium supplements.  She did respond.  It took a couple of days for  the potassium to come back up.  She got back up to 4.3 prior to  discharge.  Her hemoglobin responded.  She came back up to 10 after one  unit of blood.  Anemia was probably from a combination of blood loss and  also some dilution.  It did improve with a unit of blood and with  excellent diuresis.  She  continued slowly to progress with physical  therapy and, by July 20, 2006, she was doing better with her therapy.  She was getting up, walking 100 feet, and was feeling well.  Her  electrolyte imbalance had corrected itself.  Her potassium was back up  and she was ready to go home.   DISCHARGE PLAN:  The patient was discharged home on July 20, 2006.   DISCHARGE DIAGNOSES:  Please see above.   DISCHARGE MEDICATIONS:  Vicodin, Coumadin and Robaxin.   DIET:  Cardiac diet.   ACTIVITY:  Weightbearing as tolerated to the right lower extremity.  Home with PT and home health nursing.   FOLLOWUP:  Two  weeks.   DISPOSITION:  Home.   CONDITION ON DISCHARGE:  Improved.      Alexzandrew L. Perkins, P.A.C.      Ollen Gross, M.D.  Electronically Signed    ALP/MEDQ  D:  08/19/2006  T:  08/19/2006  Job:  [id number]   cc:   Nanetta Batty, M.D.  Fax: [phone]   Loma Sender  Fax: [phone]

## 2010-07-24 ENCOUNTER — Ambulatory Visit (INDEPENDENT_AMBULATORY_CARE_PROVIDER_SITE_OTHER): Payer: Medicare Other | Admitting: Vascular Surgery

## 2010-07-24 DIAGNOSIS — I70219 Atherosclerosis of native arteries of extremities with intermittent claudication, unspecified extremity: Secondary | ICD-10-CM

## 2010-07-25 NOTE — Assessment & Plan Note (Signed)
OFFICE VISIT  [name], [name] A DOB:  12/30/1931                                       07/24/2010 ZOXWR#:[id number]  I saw the patient in the office today for continued follow up of her peripheral vascular disease.  I had seen her on January 18th of this year to be considered for a left fem-pop bypass given a long-segment occlusion of her left superficial femoral artery.  At that time, I was somewhat concerned about her inflow on the left, and also she would likely require a prosthetic graft given that she has had a vein taken from her left leg for previous CABG.  In addition, her arm vein did not appear to be adequate.  At that time, her symptoms were tolerable, and we recommended continued conservative treatment.  She comes in for a followup visit.  She states that her claudication symptoms in the left leg have gradually progressed.  She experiences hip, thigh and calf claudication with ambulation that is relieved with rest.  She also states her symptoms are aggravated somewhat by bending over.  She does not describe any rest pain, although she does have burning pain in her feet likely related to her neuropathy.  She has had no history of nonhealing wounds.  She has had no significant symptoms on the right where she has had a previous endovascular revascularization by Dr. Allyson [name].  PAST MEDICAL HISTORY:  Significant for insulin-dependent diabetes, hypertension, hypercholesterolemia, coronary artery disease, congestive heart failure, and COPD.  SOCIAL HISTORY:  She is not a smoker; she quit in 1990.  REVIEW OF SYSTEMS:  CARDIOVASCULAR:  She has had no recent chest pain, chest pressure, palpitations or arrhythmias.  She has had no history of stroke, TIAs or amaurosis fugax.  She has had no history of DVT or phlebitis. PULMONARY:  She has had no productive cough, bronchitis, asthma or wheezing.  PHYSICAL EXAMINATION:  General:  This is a pleasant  77 year old woman who appears her stated age.  Vital Signs:  Blood pressure 156/56, heart rate is 65, respiratory rate is 12.  Lungs:  Clear bilaterally to auscultation without rales, rhonchi or wheezing.  Cardiovascular Exam: I do not detect any carotid bruits.  She has a regular rate and rhythm. I cannot palpate a left femoral pulse.  She does have a palpable right femoral pulse.  I cannot palpate popliteal or pedal pulses on either side.  She has no significant lower extremity swelling.  Abdomen:  Obese and difficult to assess.  She has normal-pitched bowel sounds. Neurologic Exam:  She has no focal weakness or paresthesias.  Skin: There are no wounds or ulcers on her feet.  She does have some hyperpigmentation consistent with chronic venous insufficiency.  I have reviewed her records from Dr. Hazle Coca office.  She had a stress test on 07/03/2010 which showed evidence of moderate ischemia in the mid anterior and apical anterior regions and a moderate perfusion defect consistent with an infarct or scar.  Global left ventricular systolic function was mildly reduced.  Echo was stable with an ejection fraction of 50% to 55%.  I have also previously reviewed her arteriogram from December when she had an SFA stenting on the right.  She has significant calcific disease that is generalized with what appears to be a significant left common iliac artery stenosis which I suspect has progressed.  In addition, she has common femoral artery stenosis and an occluded superficial femoral artery at its origin. Because of the proximal disease, there is very poor visualization distally, and I cannot really determine what the best target would be for a bypass.  I have explained to the patient that I do not think she is a candidate currently for a fem-pop bypass graft because of her poor inflow.  She would likely require a prosthetic graft and has no femoral pulse on the left.  I think it would be  reasonable for Dr. Allyson [name] to restudy her and attempt an endovascular approach to her inflow disease on the left, if this is possible.  If good inflow can be established, she could be considered for bypass grafting, and hopefully after establishing better inflow, we could get better visualization distally in order to determine the best target.  With respect to her aortoiliac disease, I do not think she is a candidate for aortofemoral bypass grafting given her age and multiple medical comorbidities.  I have explained to her that as long as she does not develop a wound in her foot, I think she has adequate circulation, although she feels that her symptoms are quite bothersome and are progressing.  Fortunately, she is not a smoker.  We will arrange for her see Dr. Allyson [name] to discuss possible arteriography and intervention on the left with respect to her proximal common iliac artery disease. If this can be addressed, then we can reconsider left fem-pop bypass grafting.    [name]. [name], M.D. Electronically Signed  CSD/MEDQ  D:  07/24/2010  T:  07/25/2010  Job:  [id number]  cc:   Nanetta Batty, M.D.

## 2010-08-08 ENCOUNTER — Ambulatory Visit (HOSPITAL_COMMUNITY)
Admission: RE | Admit: 2010-08-08 | Discharge: 2010-08-09 | Disposition: A | Discharge status: Home / Self Care | Payer: Medicare Other | Source: Ambulatory Visit | Attending: Cardiovascular Disease | Admitting: Cardiovascular Disease

## 2010-08-08 DIAGNOSIS — I2510 Atherosclerotic heart disease of native coronary artery without angina pectoris: Secondary | ICD-10-CM | POA: Insufficient documentation

## 2010-08-08 DIAGNOSIS — I7092 Chronic total occlusion of artery of the extremities: Secondary | ICD-10-CM | POA: Insufficient documentation

## 2010-08-08 DIAGNOSIS — I70219 Atherosclerosis of native arteries of extremities with intermittent claudication, unspecified extremity: Secondary | ICD-10-CM | POA: Insufficient documentation

## 2010-08-08 DIAGNOSIS — T82898A Other specified complication of vascular prosthetic devices, implants and grafts, initial encounter: Secondary | ICD-10-CM | POA: Insufficient documentation

## 2010-08-08 DIAGNOSIS — I10 Essential (primary) hypertension: Secondary | ICD-10-CM | POA: Insufficient documentation

## 2010-08-08 DIAGNOSIS — Y831 Surgical operation with implant of artificial internal device as the cause of abnormal reaction of the patient, or of later complication, without mention of misadventure at the time of the procedure: Secondary | ICD-10-CM | POA: Insufficient documentation

## 2010-08-08 DIAGNOSIS — E785 Hyperlipidemia, unspecified: Secondary | ICD-10-CM | POA: Insufficient documentation

## 2010-08-08 DIAGNOSIS — E119 Type 2 diabetes mellitus without complications: Secondary | ICD-10-CM | POA: Insufficient documentation

## 2010-08-08 DIAGNOSIS — Z951 Presence of aortocoronary bypass graft: Secondary | ICD-10-CM | POA: Insufficient documentation

## 2010-08-08 DIAGNOSIS — E039 Hypothyroidism, unspecified: Secondary | ICD-10-CM | POA: Insufficient documentation

## 2010-08-08 LAB — GLUCOSE, CAPILLARY
Glucose-Capillary: 272 mg/dL — ABNORMAL HIGH (ref 70–99)
Glucose-Capillary: 255 mg/dL — ABNORMAL HIGH (ref 70–99)
Glucose-Capillary: 243 mg/dL — ABNORMAL HIGH (ref 70–99)

## 2010-08-08 LAB — BASIC METABOLIC PANEL
CO2: 26 mEq/L (ref 19–32)
Calcium: 8.5 mg/dL (ref 8.4–10.5)
Creatinine, Ser: 1.57 mg/dL — ABNORMAL HIGH (ref 0.50–1.10)
GFR calc non Af Amer: 32 mL/min — ABNORMAL LOW (ref >60)
Glucose, Bld: 378 mg/dL — ABNORMAL HIGH (ref 70–99)
Sodium: 134 mEq/L — ABNORMAL LOW (ref 135–145)

## 2010-08-09 LAB — BASIC METABOLIC PANEL
BUN: 24 mg/dL — ABNORMAL HIGH (ref 6–23)
Chloride: 99 mEq/L (ref 96–112)
GFR calc Af Amer: 45 mL/min — ABNORMAL LOW (ref >60)
Potassium: 3.7 mEq/L (ref 3.5–5.1)

## 2010-08-09 LAB — GLUCOSE, CAPILLARY: Glucose-Capillary: 185 mg/dL — ABNORMAL HIGH (ref 70–99)

## 2010-08-13 NOTE — Procedures (Signed)
NAMEMarland Kitchen  [name], [name] NO.:  [id number]  MEDICAL RECORD NO.:  [id number]  LOCATION:  3710                         FACILITY:  [facility]  PHYSICIAN:  Nanetta Batty, M.D.   DATE OF BIRTH:  [birth year]  DATE OF PROCEDURE: DATE OF DISCHARGE:                   PERIPHERAL VASCULAR INVASIVE PROCEDURE   [name] is a 77 year old married Caucasian female, history of CAD and PVD.  She has had a right SFA stenting with 3 dilatations for in- stent restenosis.  Other problems include hypertension, hyperlipidemia and diabetes.  She has known CAD status post bypass grafting in 1989 with multiple interventions since.  She does have lifestyle-limiting claudication.  I sent her to Dr. Cari Caraway for consideration of left fem-pop bypass grafting.  He requested abdominal aortogram with bifemoral runoff to further define her proximal iliac in order to preserve upstream flow prior to surgical bypass.  PROCEDURE DESCRIPTION:  The patient was brought to the Second [facility] in the postabsorptive state.  She is premedicated with p.o. Valium.  Left groin was prepped and shaved in usual sterile fashion.  Xylocaine 1% was used for local anesthesia.  A 5- French sheath was inserted into the left femoral artery using standard Seldinger technique.  A 5-French tennis racket catheter was used for abdominal aortography with bifemoral runoff using bolus chase, digital subtraction step table technique.  Visipaque dye was used for the entirety of the case.  Retrograde aortic pressure was monitored during the case.  ANGIOGRAPHIC RESULTS: 1. Abdominal aorta.     a.     Distal abdominal aorta - normal. 2. Left lower extremity;     a.     40-50% ostial left common iliac artery stenosis with      poststenotic dilatation and less than 20 mm post pullback gradient      after administration of 200 mcg of intraarterial nitroglycerin via      side-arm sheath.     b.      Total SFA with reconstitution in the Hunter's canal.         I. Two-vessel runoff. 3. Right lower extremity;     a.     Patent right SFA stent with 60% segmental areas of in-stent      restenosis in the proximal and midportion.         I. One-vessel runoff to the peroneal.  IMPRESSION:  [name] has a patent right SFA stent with moderate in- stent restenosis and no change in her left common iliac artery stenosis. The ostium was less than 20 mm of transstenotic gradient.  She does have 70% calcified stenosis in her left common femoral artery which will probably need to be endarterectomized at the time of fem-pop bypass grafting.  Because of her age, hypertension, prior history of groin bleed after procedures, I am going to keep her here overnight, hydrate her and sent her home in the morning.  Sheath was removed and pressure was held on the groin to achieve hemostasis.  The patient left lab in stable condition.     Nanetta Batty, M.D.     JB/MEDQ  D:  08/08/2010  T:  08/09/2010  Job:  [id number] cc:   [facility]. [name]. [name], M.D. Loma Sender, MD  Electronically Signed by Nanetta Batty M.D. on 08/13/2010 09:24:11 AM

## 2010-08-15 NOTE — Discharge Summary (Signed)
[name]  Sara, [name]:  [id number]  MEDICAL RECORD NO.:  [id number]  LOCATION:  3710                         FACILITY:  [facility]  PHYSICIAN:  Landry Corporal, [place] DDATE OF BIRTH:  Jun 29, 1931  DATE OF ADMISSION:  08/08/2010 DATE OF DISCHARGE:  08/09/2010                              DISCHARGE SUMMARY   DISCHARGE DIAGNOSES: 1. Peripheral arterial disease, status post pulmonary vein angiogram     and even left fem-pop bypass grafting. 2. Hypothyroidism, medication adjusted prior to admission. 3. Coronary artery disease.  HOSPITAL COURSE:  [name] is a 77 year old Caucasian female with a history of coronary artery disease, status post coronary artery bypass grafting in 1989, peripheral arterial disease, status post bilateral SFA stents, hypertension, hyperlipidemia, and diabetes.  She was last intervened in February 2012 for in-stent restenosis in the right SFA. She had a totalled SFA at the origin with reconstitution of adductor canal.  Two-vessel runoff.  There is a 40-50% ostial left common iliac artery stenosis with no gradient.  Her last Myoview was on May 21, 2009, low risk.  She presented to [facility] for peripheral vascular angiogram and extremity angiogram.  She reports occasional shortness of breath, right leg pain, and left leg pain, which awakes her at night and gets worse with ambulation and improves with elevation.  Impression of the procedures showed that she has a patent right SFA stent and moderate in-stent restenosis and no change in her left common iliac artery stenosis.  The ostium was less than 20 mm of transstenotic gradient.  She does have a 70% calcified stenosis in her left common femoral artery, which will probably be endarterectomized at the time of fem-pop bypass grafting.  The patient is seen by Dr. Herbie Baltimore who feels she is stable for discharge home.  DISCHARGE LABORATORIES:  Sodium 136, potassium 3.7, chloride 99,  carbon dioxide 29, glucose 271, BUN 24, creatinine 1.39, calcium 9.1.  STUDIES/PROCEDURES:  PV angiogram results in the hospital course.  DISCHARGE MEDICATIONS: 1. Aspirin 325 mg 1 tablet by mouth daily. 2. Benazepril 10 mg 1 tablet by mouth daily. 3. Diltiazem CD 240 mg 1 capsule by mouth daily. 4. Ferrous sulfate 65 mg 1 tablet by mouth daily. 5. Gabapentin 400 mg 1 capsule by mouth 3 times a day. 6. Humulin insulin 40-50 units, 50 units in the morning, 45 units at     night. 7. Imdur XR 60 mg 1/2 tablet by mouth daily. 8. Lasix 40 mg 1 tablet by mouth twice daily. 9. Metformin 500 mg 1 tablet by mouth twice daily.  The patient will     restart this medication on August 09, 2010. 10.Metoprolol tartrate 50 mg 1/1 tablet by mouth twice daily. 11.Amiodarone 200 mg 1 tablet by mouth twice daily. 12.Plavix 75 mg 1 tablet by mouth daily. 13.Pravastatin 40 mg 1 tablet by mouth daily at bedtime. 14.Pletal 50 mg 1 tablet by mouth 3 times a day. 15.Potassium chloride 10 mEq 1 tablet by mouth daily. 16.Ranexa 500 mg 1 tablet by mouth daily. 17.Synthroid 75 mcg 1 tablet by mouth daily. 18.Tylenol Extra Strength 1 tablet by mouth every 6 hours as needed     for pain. 19.Trilipix  135 mg 1 capsule by mouth daily.  DISPOSITION:  Ms. Burdine will be discharged in stable condition.  It was recommended that she increase her activity slowly.  No driving or lifting for 2 days.  It was recommended she is to eat a low-sodium heart- healthy diet.  She will follow up with Dr. Allyson [name], and our office will call her with the appointment time.    ______________________________ Wilburt Finlay, PA   ______________________________ Landry Corporal, MD    BH/MEDQ  D:  08/09/2010  T:  08/10/2010  Job:  [id number]  cc:   Nanetta Batty, M.D. [name]. [name], M.D. Loma Sender, MD  Electronically Signed by Wilburt Finlay PA on 08/12/2010 11:56:12 AM Electronically Signed by Bryan Lemma MD on  08/15/2010 07:59:01 PM

## 2010-08-28 ENCOUNTER — Ambulatory Visit (INDEPENDENT_AMBULATORY_CARE_PROVIDER_SITE_OTHER): Payer: Medicare Other | Admitting: Vascular Surgery

## 2010-08-28 DIAGNOSIS — I70219 Atherosclerosis of native arteries of extremities with intermittent claudication, unspecified extremity: Secondary | ICD-10-CM

## 2010-08-28 NOTE — Assessment & Plan Note (Signed)
OFFICE VISIT  [name], [name] A DOB:  [birth year]                                       08/28/2010 ZOXWR#:[id number]  I saw patient for follow-up of her left lower extremity claudication. This is a pleasant 77 year old woman who I last saw on 07/24/10 with significant left lower extremity claudication and rest pain in the left foot.  At that time, I did not think she was good candidate for fem-pop bypass grafting because she had a diminished left femoral pulse with poor inflow and also would require a prosthetic graft, as she has previously had her vein taken for coronary revascularization.  I thought it would be reasonable to have Dr. Allyson [name] restudy her and attempt endovascular intervention to hopefully improve the inflow and also better visualize distally what her runoff is.  In addition, I did not think she was a good candidate for aortofemoral bypass grafting because of her age and multiple comorbidities, including diabetes, hypertension, coronary artery disease, congestive heart failure, and COPD.  Since I saw her last, she has undergone an arteriogram by Dr. Allyson [name] which demonstrates a proximal left common iliac artery stenosis in addition to the disease in the common femoral artery on the left.  She has an occluded superficial femoral artery on the left with reconstitution of the below-knee popliteal artery.  She has had a previous stent in her above-knee popliteal artery.  She appears to have posterior tibial and peroneal artery on the runoff on the left.  REVIEW OF SYSTEMS:  CARDIOVASCULAR:  She has had no chest pain, chest pressure, palpitations or arrhythmias.  PHYSICAL EXAMINATION:  This is a pleasant 77 year old woman who appears her stated age.  Blood pressure is 152/57, heart rate is 71, saturation 98%.  Lungs are clear bilaterally to auscultation.  Cardiovascular:  I do not detect any carotid bruits.  She has a regular rate and rhythm. She  has diminished femoral pulses bilaterally.  Both feet appear adequately perfused without ischemic ulcers.  I have reviewed her arteriogram performed by Dr. Allyson [name], which shows a proximal left common iliac artery stenosis in addition to common femoral artery disease on the left with a superficial femoral artery occlusion and reconstitution to the below-knee popliteal artery.  Again, I do not think she is an ideal candidate for fem-pop bypass grafting, given the compromised inflow on the right and the fact that she would require a prosthetic graft.  I have explained that I do not think it would be unreasonable to proceed with bypass grafting with the understanding that she would be at an increased risk for graft thrombosis, given these issues.  Currently, she feels that her symptoms are tolerable and would like to continue to follow this.  I plan on seeing her back in 6 months.  If her symptoms progress or she would like to pursue fem-pop bypass grafting, I explained that I do not think this is unreasonable.  We have discussed the potential complications of surgery, including but not limited to bleeding, wound healing problems, graft infection, cardiac or pulmonary problems. Certainly she is at a slightly increased risk for infection because of her obesity.  I will see her back in 6 months.  She knows to call sooner if she has problems.    [name]. [name], M.D. Electronically Signed  CSD/MEDQ  D:  08/28/2010  T:  08/28/2010  Job:  [id number]  cc:   Nanetta Batty, M.D.

## 2010-11-07 LAB — CBC
HCT: 37
HCT: 37.3
Hemoglobin: 12.9
Hemoglobin: 12.9
MCHC: 34.4
MCHC: 34.7
MCHC: 34.8
MCHC: 35.1
MCV: 94
MCV: 94.3
MCV: 94.9
MCV: 95.2
Platelets: 246
RBC: 3.91
RBC: 3.93
RBC: 3.93
RDW: 13.3
WBC: 11.8 — ABNORMAL HIGH
WBC: 7.7

## 2010-11-07 LAB — GLUCOSE, RANDOM: Glucose, Bld: 459 — ABNORMAL HIGH

## 2010-11-07 LAB — CARDIAC PANEL(CRET KIN+CKTOT+MB+TROPI)
CK, MB: 3.1
Relative Index: INVALID
Total CK: 62
Troponin I: 0.33 — ABNORMAL HIGH

## 2010-11-07 LAB — LIPID PANEL
HDL: 22 — ABNORMAL LOW
LDL Cholesterol: 89
Triglycerides: 236 — ABNORMAL HIGH

## 2010-11-07 LAB — BASIC METABOLIC PANEL
BUN: 11
BUN: 42 — ABNORMAL HIGH
CO2: 25
CO2: 26
CO2: 29
Calcium: 8.3 — ABNORMAL LOW
Chloride: 101
Chloride: 101
Chloride: 98
Creatinine, Ser: 0.79
Creatinine, Ser: 1.02
Creatinine, Ser: 1.37 — ABNORMAL HIGH
GFR calc Af Amer: >60
Glucose, Bld: 499 — ABNORMAL HIGH

## 2010-11-07 LAB — I-STAT 8, (EC8 V) (CONVERTED LAB)
Glucose, Bld: 286 — ABNORMAL HIGH
HCT: 39
Hemoglobin: 13.3
Potassium: 4
Sodium: 139
TCO2: 27

## 2010-11-07 LAB — CK TOTAL AND CKMB (NOT AT ARMC)
CK, MB: 4.3 — ABNORMAL HIGH
Total CK: 73

## 2010-11-07 LAB — COMPREHENSIVE METABOLIC PANEL
Albumin: 3.6
BUN: 9
Chloride: 99
Creatinine, Ser: 0.76
Glucose, Bld: 261 — ABNORMAL HIGH
Total Bilirubin: 0.3
Total Protein: 6.7

## 2010-11-07 LAB — B-NATRIURETIC PEPTIDE (CONVERTED LAB)
Pro B Natriuretic peptide (BNP): 128 — ABNORMAL HIGH
Pro B Natriuretic peptide (BNP): 358 — ABNORMAL HIGH
Pro B Natriuretic peptide (BNP): 359 — ABNORMAL HIGH

## 2010-11-07 LAB — DIFFERENTIAL
Basophils Relative: 1
Eosinophils Absolute: 0.3
Monocytes Absolute: 0.6
Monocytes Relative: 9

## 2010-11-07 LAB — POCT CARDIAC MARKERS
CKMB, poc: 2.4
Troponin i, poc: <0.05

## 2010-11-07 LAB — TSH: TSH: 5.384

## 2010-11-07 LAB — MAGNESIUM: Magnesium: 1.7

## 2010-11-07 LAB — POCT I-STAT CREATININE: Operator id: 151321

## 2010-11-07 LAB — HEMOGLOBIN A1C: Hgb A1c MFr Bld: 8.4 — ABNORMAL HIGH

## 2010-11-11 LAB — POCT CARDIAC MARKERS
CKMB, poc: 1
CKMB, poc: <1 — ABNORMAL LOW
Myoglobin, poc: 63.3
Troponin i, poc: 0.05

## 2010-11-11 LAB — PROTIME-INR: INR: 1

## 2010-11-11 LAB — CBC
Platelets: 292
RDW: 14.9

## 2010-11-11 LAB — I-STAT 8, (EC8 V) (CONVERTED LAB)
HCT: 40
TCO2: 23
pCO2, Ven: 28.6 — ABNORMAL LOW
pH, Ven: 7.496 — ABNORMAL HIGH

## 2010-11-11 LAB — APTT: aPTT: 25

## 2010-11-11 LAB — POCT I-STAT CREATININE: Creatinine, Ser: 1

## 2010-11-15 ENCOUNTER — Emergency Department (HOSPITAL_COMMUNITY): Payer: Medicare Other

## 2010-11-15 ENCOUNTER — Inpatient Hospital Stay (HOSPITAL_COMMUNITY)
Admission: EM | Admit: 2010-11-15 | Discharge: 2010-11-29 | DRG: 286 | Disposition: A | Discharge status: Home Health | Payer: Medicare Other | Attending: Cardiovascular Disease | Admitting: Cardiovascular Disease

## 2010-11-15 DIAGNOSIS — Z79899 Other long term (current) drug therapy: Secondary | ICD-10-CM

## 2010-11-15 DIAGNOSIS — I4891 Unspecified atrial fibrillation: Secondary | ICD-10-CM | POA: Diagnosis present

## 2010-11-15 DIAGNOSIS — E039 Hypothyroidism, unspecified: Secondary | ICD-10-CM | POA: Diagnosis present

## 2010-11-15 DIAGNOSIS — Z8249 Family history of ischemic heart disease and other diseases of the circulatory system: Secondary | ICD-10-CM

## 2010-11-15 DIAGNOSIS — I509 Heart failure, unspecified: Secondary | ICD-10-CM | POA: Diagnosis present

## 2010-11-15 DIAGNOSIS — E871 Hypo-osmolality and hyponatremia: Secondary | ICD-10-CM | POA: Diagnosis present

## 2010-11-15 DIAGNOSIS — I5023 Acute on chronic systolic (congestive) heart failure: Secondary | ICD-10-CM | POA: Diagnosis present

## 2010-11-15 DIAGNOSIS — E876 Hypokalemia: Secondary | ICD-10-CM | POA: Diagnosis not present

## 2010-11-15 DIAGNOSIS — I70209 Unspecified atherosclerosis of native arteries of extremities, unspecified extremity: Secondary | ICD-10-CM | POA: Diagnosis present

## 2010-11-15 DIAGNOSIS — K219 Gastro-esophageal reflux disease without esophagitis: Secondary | ICD-10-CM | POA: Diagnosis present

## 2010-11-15 DIAGNOSIS — I200 Unstable angina: Secondary | ICD-10-CM | POA: Diagnosis present

## 2010-11-15 DIAGNOSIS — E1169 Type 2 diabetes mellitus with other specified complication: Secondary | ICD-10-CM | POA: Diagnosis not present

## 2010-11-15 DIAGNOSIS — Z880 Allergy status to penicillin: Secondary | ICD-10-CM

## 2010-11-15 DIAGNOSIS — Z91041 Radiographic dye allergy status: Secondary | ICD-10-CM

## 2010-11-15 DIAGNOSIS — E785 Hyperlipidemia, unspecified: Secondary | ICD-10-CM | POA: Diagnosis present

## 2010-11-15 DIAGNOSIS — I2582 Chronic total occlusion of coronary artery: Secondary | ICD-10-CM | POA: Diagnosis present

## 2010-11-15 DIAGNOSIS — Z7982 Long term (current) use of aspirin: Secondary | ICD-10-CM

## 2010-11-15 DIAGNOSIS — N183 Chronic kidney disease, stage 3 unspecified: Secondary | ICD-10-CM | POA: Diagnosis present

## 2010-11-15 DIAGNOSIS — M47817 Spondylosis without myelopathy or radiculopathy, lumbosacral region: Secondary | ICD-10-CM | POA: Diagnosis present

## 2010-11-15 DIAGNOSIS — I2510 Atherosclerotic heart disease of native coronary artery without angina pectoris: Principal | ICD-10-CM | POA: Diagnosis present

## 2010-11-15 DIAGNOSIS — Z794 Long term (current) use of insulin: Secondary | ICD-10-CM

## 2010-11-15 DIAGNOSIS — K5732 Diverticulitis of large intestine without perforation or abscess without bleeding: Secondary | ICD-10-CM | POA: Diagnosis present

## 2010-11-15 DIAGNOSIS — Z96659 Presence of unspecified artificial knee joint: Secondary | ICD-10-CM

## 2010-11-15 DIAGNOSIS — I25810 Atherosclerosis of coronary artery bypass graft(s) without angina pectoris: Secondary | ICD-10-CM | POA: Diagnosis present

## 2010-11-15 DIAGNOSIS — K5900 Constipation, unspecified: Secondary | ICD-10-CM | POA: Diagnosis present

## 2010-11-15 DIAGNOSIS — N179 Acute kidney failure, unspecified: Secondary | ICD-10-CM | POA: Diagnosis not present

## 2010-11-15 DIAGNOSIS — I4510 Unspecified right bundle-branch block: Secondary | ICD-10-CM | POA: Diagnosis present

## 2010-11-15 DIAGNOSIS — I129 Hypertensive chronic kidney disease with stage 1 through stage 4 chronic kidney disease, or unspecified chronic kidney disease: Secondary | ICD-10-CM | POA: Diagnosis present

## 2010-11-15 DIAGNOSIS — Z7902 Long term (current) use of antithrombotics/antiplatelets: Secondary | ICD-10-CM

## 2010-11-15 DIAGNOSIS — Z87891 Personal history of nicotine dependence: Secondary | ICD-10-CM

## 2010-11-15 DIAGNOSIS — Z96649 Presence of unspecified artificial hip joint: Secondary | ICD-10-CM

## 2010-11-15 HISTORY — DX: Essential (primary) hypertension: I10

## 2010-11-15 HISTORY — DX: Atherosclerotic heart disease of native coronary artery without angina pectoris: I25.10

## 2010-11-15 LAB — CARDIAC PANEL(CRET KIN+CKTOT+MB+TROPI)
CK, MB: 2.9
CK, MB: 3.6
CK, MB: 4.2 — ABNORMAL HIGH
Relative Index: INVALID
Relative Index: INVALID
Relative Index: INVALID
Total CK: 20
Total CK: 32
Total CK: 41
Total CK: 45
Total CK: 45
Total CK: 50
Troponin I: 0.01
Troponin I: 0.13 — ABNORMAL HIGH
Troponin I: 0.15 — ABNORMAL HIGH
Troponin I: 0.41 — ABNORMAL HIGH

## 2010-11-15 LAB — URINALYSIS, ROUTINE W REFLEX MICROSCOPIC
Bilirubin Urine: NEGATIVE
Glucose, UA: NEGATIVE mg/dL
Ketones, ur: NEGATIVE mg/dL
Ketones, ur: NEGATIVE
Leukocytes, UA: NEGATIVE
Nitrite: POSITIVE — AB
Protein, ur: NEGATIVE mg/dL
Protein, ur: NEGATIVE
Urobilinogen, UA: 0.2 mg/dL (ref 0.0–1.0)
Urobilinogen, UA: 0.2
pH: 6

## 2010-11-15 LAB — CBC
HCT: 31 — ABNORMAL LOW
HCT: 32.9 — ABNORMAL LOW
HCT: 33.5 — ABNORMAL LOW
HCT: 34.9 — ABNORMAL LOW
HCT: 38.5
Hemoglobin: 10.6 — ABNORMAL LOW
Hemoglobin: 11.2 — ABNORMAL LOW
Hemoglobin: 12.3
MCHC: 34.8 g/dL (ref 30.0–36.0)
MCHC: 33.8
MCHC: 34.2
MCHC: 34.4
MCHC: 34.9
MCV: 95.5
MCV: 96.4
MCV: 96.7
MCV: 96.7
MCV: 97.2
Platelets: 269 10*3/uL (ref 150–400)
Platelets: 252
Platelets: 253
Platelets: 254
Platelets: 298
RBC: 3.69 — ABNORMAL LOW
RDW: 13.8 % (ref 11.5–15.5)
RDW: 14
RDW: 14
RDW: 14.2
RDW: 14.3
RDW: 14.4
WBC: 7.6 10*3/uL (ref 4.0–10.5)
WBC: 10.4
WBC: 5.6
WBC: 8.6

## 2010-11-15 LAB — BASIC METABOLIC PANEL
BUN: 21
BUN: 21
BUN: 29 — ABNORMAL HIGH
CO2: 25
CO2: 27
Calcium: 8.9
Calcium: 8.9
Calcium: 9
Calcium: 9
Chloride: 101
Chloride: 104
Creatinine, Ser: 1.09
Creatinine, Ser: 1.1
Creatinine, Ser: 1.25 — ABNORMAL HIGH
GFR calc Af Amer: 60 — ABNORMAL LOW
GFR calc Af Amer: >60
GFR calc non Af Amer: 46 — ABNORMAL LOW
GFR calc non Af Amer: 49 — ABNORMAL LOW
GFR calc non Af Amer: 60 — ABNORMAL LOW
Glucose, Bld: 101 — ABNORMAL HIGH
Glucose, Bld: 122 — ABNORMAL HIGH
Glucose, Bld: 155 — ABNORMAL HIGH
Glucose, Bld: 261 — ABNORMAL HIGH
Glucose, Bld: 374 — ABNORMAL HIGH
Potassium: 4.4
Sodium: 142

## 2010-11-15 LAB — CK TOTAL AND CKMB (NOT AT ARMC)
CK, MB: 3.5
Relative Index: INVALID
Relative Index: INVALID (ref 0.0–2.5)
Total CK: 55
Total CK: 68 U/L (ref 7–177)

## 2010-11-15 LAB — APTT: aPTT: 39 — ABNORMAL HIGH

## 2010-11-15 LAB — HEPARIN LEVEL (UNFRACTIONATED)
Heparin Unfractionated: 0.47
Heparin Unfractionated: 0.77 — ABNORMAL HIGH
Heparin Unfractionated: 0.92 — ABNORMAL HIGH

## 2010-11-15 LAB — TROPONIN I: Troponin I: <0.3 ng/mL (ref <0.30)

## 2010-11-15 LAB — DIFFERENTIAL
Basophils Absolute: 0 10*3/uL (ref 0.0–0.1)
Basophils Absolute: 0.1
Basophils Relative: 0 % (ref 0–1)
Eosinophils Absolute: 0.3 10*3/uL (ref 0.0–0.7)
Eosinophils Relative: 4 % (ref 0–5)
Lymphocytes Relative: 25 % (ref 12–46)
Lymphs Abs: 2.8
Monocytes Absolute: 0.8 10*3/uL (ref 0.1–1.0)
Monocytes Absolute: 0.8
Neutrophils Relative %: 48

## 2010-11-15 LAB — HEMOGLOBIN A1C: Hgb A1c MFr Bld: 7.2 — ABNORMAL HIGH

## 2010-11-15 LAB — COMPREHENSIVE METABOLIC PANEL
ALT: 22 U/L (ref 0–35)
AST: 22
Albumin: 3.7
Alkaline Phosphatase: 70 U/L (ref 39–117)
BUN: 22 mg/dL (ref 6–23)
CO2: 26 mEq/L (ref 19–32)
CO2: 28
Calcium: 9.5 mg/dL (ref 8.4–10.5)
Calcium: 9.4
Creatinine, Ser: 0.85
GFR calc Af Amer: 50 mL/min — ABNORMAL LOW (ref >60)
GFR calc Af Amer: >60
GFR calc non Af Amer: 41 mL/min — ABNORMAL LOW (ref >60)
GFR calc non Af Amer: >60
Glucose, Bld: 157 mg/dL — ABNORMAL HIGH (ref 70–99)
Sodium: 139 mEq/L (ref 135–145)
Total Protein: 6.6 g/dL (ref 6.0–8.3)
Total Protein: 6.1

## 2010-11-15 LAB — B-NATRIURETIC PEPTIDE (CONVERTED LAB): Pro B Natriuretic peptide (BNP): 137 — ABNORMAL HIGH

## 2010-11-15 LAB — POCT I-STAT TROPONIN I
Troponin i, poc: 0 ng/mL (ref 0.00–0.08)
Troponin i, poc: 0 ng/mL (ref 0.00–0.08)

## 2010-11-15 LAB — LIPID PANEL
LDL Cholesterol: 55
VLDL: 65 — ABNORMAL HIGH

## 2010-11-15 LAB — POCT CARDIAC MARKERS
Myoglobin, poc: 68.1
Operator id: 288831
Troponin i, poc: <0.05

## 2010-11-15 LAB — MRSA PCR SCREENING: MRSA by PCR: NEGATIVE

## 2010-11-15 LAB — PROTIME-INR
INR: 1.1
Prothrombin Time: 14.1

## 2010-11-15 LAB — PRO B NATRIURETIC PEPTIDE: Pro B Natriuretic peptide (BNP): 428.4 pg/mL (ref 0–450)

## 2010-11-15 LAB — AMYLASE: Amylase: 43

## 2010-11-15 LAB — GLUCOSE, CAPILLARY

## 2010-11-15 LAB — URINE MICROSCOPIC-ADD ON

## 2010-11-15 LAB — D-DIMER, QUANTITATIVE: D-Dimer, Quant: 0.93 ug/mL-FEU — ABNORMAL HIGH (ref 0.00–0.48)

## 2010-11-16 LAB — LIPID PANEL
Cholesterol: 144 mg/dL (ref 0–200)
Triglycerides: 105 mg/dL (ref <150)
VLDL: 21 mg/dL (ref 0–40)

## 2010-11-16 LAB — HEPARIN LEVEL (UNFRACTIONATED)
Heparin Unfractionated: 0.14 IU/mL — ABNORMAL LOW (ref 0.30–0.70)
Heparin Unfractionated: 0.25 IU/mL — ABNORMAL LOW (ref 0.30–0.70)
Heparin Unfractionated: 0.39 IU/mL (ref 0.30–0.70)

## 2010-11-16 LAB — CBC
HCT: 33.9 % — ABNORMAL LOW (ref 36.0–46.0)
MCHC: 35.1 g/dL (ref 30.0–36.0)
Platelets: 253 10*3/uL (ref 150–400)
RDW: 13.2 % (ref 11.5–15.5)
WBC: 5.3 10*3/uL (ref 4.0–10.5)

## 2010-11-16 LAB — CARDIAC PANEL(CRET KIN+CKTOT+MB+TROPI)
CK, MB: 2.9 ng/mL (ref 0.3–4.0)
Relative Index: INVALID (ref 0.0–2.5)
Relative Index: INVALID (ref 0.0–2.5)
Total CK: 54 U/L (ref 7–177)
Total CK: 47 U/L (ref 7–177)
Troponin I: <0.3 ng/mL (ref <0.30)
Troponin I: <0.3 ng/mL (ref <0.30)

## 2010-11-16 LAB — GLUCOSE, CAPILLARY
Glucose-Capillary: 365 mg/dL — ABNORMAL HIGH (ref 70–99)
Glucose-Capillary: 390 mg/dL — ABNORMAL HIGH (ref 70–99)
Glucose-Capillary: 185 mg/dL — ABNORMAL HIGH (ref 70–99)

## 2010-11-16 LAB — URINE CULTURE

## 2010-11-17 ENCOUNTER — Inpatient Hospital Stay (HOSPITAL_COMMUNITY): Payer: Medicare Other

## 2010-11-17 LAB — CBC
Hemoglobin: 10.1 g/dL — ABNORMAL LOW (ref 12.0–15.0)
MCH: 32.8 pg (ref 26.0–34.0)
MCHC: 34.5 g/dL (ref 30.0–36.0)
RDW: 13.6 % (ref 11.5–15.5)

## 2010-11-17 LAB — BASIC METABOLIC PANEL
Calcium: 8.8 mg/dL (ref 8.4–10.5)
GFR calc Af Amer: 59 mL/min — ABNORMAL LOW (ref >60)
GFR calc non Af Amer: 49 mL/min — ABNORMAL LOW (ref >60)
Glucose, Bld: 111 mg/dL — ABNORMAL HIGH (ref 70–99)
Potassium: 3.9 mEq/L (ref 3.5–5.1)
Sodium: 140 mEq/L (ref 135–145)

## 2010-11-17 LAB — T4: T4, Total: 7.5 ug/dL (ref 5.0–12.5)

## 2010-11-17 MED ORDER — XENON XE 133 GAS
10.0000 | GAS_FOR_INHALATION | Freq: Once | RESPIRATORY_TRACT | Status: AC | PRN
  Administered 2010-11-17: 10 via RESPIRATORY_TRACT

## 2010-11-17 MED ORDER — TECHNETIUM TO 99M ALBUMIN AGGREGATED
6.0000 | Freq: Once | INTRAVENOUS | Status: AC | PRN
  Administered 2010-11-17: 6 via INTRAVENOUS

## 2010-11-17 NOTE — H&P (Signed)
NAME:  [name], [place] NO.:  [id number]  MEDICAL RECORD NO.:  [id number]  LOCATION:  [facility]                         FACILITY:  [facility]  PHYSICIAN:  Nanetta Batty, M.D.   DATE OF BIRTH:  [birth year]  DATE OF ADMISSION:  11/15/2010 DATE OF DISCHARGE:  10/31/2010                             HISTORY & PHYSICAL   CHIEF COMPLAINT:  Chest pain, shortness of breath.  HISTORY OF PRESENT ILLNESS:  [name] is a 77 year old female who has been followed by Dr. Allyson [name].  She has a long history of coronary and vascular disease and diabetes.  She had bypass grafting in 1989.  She had a PCI in 2003.  Her last catheterization was July 2009, at that time she had a total vein graft to the RCA, patent LIMA to the LAD, patent SVG to the OM.  She did have a Myoview, May 2012, that suggested moderate ischemia in the mid anterior and anterior apical regions, overall it was felt to be a low-risk scan compared to the previousreport.  There did seem to be a suggestion of basilar inferior that appeared to be new but this was fixed.  The patient was recently admitted for peripheral angiogram June 2012.  Plan is for medical therapy from that standpoint.  Over the last 24 hours or so, she has felt "weak and apprehensive."  This morning about 5 a.m. she woke up short of breath.  She said she became mildly diaphoretic with this.  She sat in front of a fan.  She took 1 nitroglycerin at home and then 1 nitroglycerin in the emergency room.  She eventually achieved some relief.  She still has a vague sensation of pressure in her chest.  She is admitted now for further evaluation.  PAST MEDICAL HISTORY:  Remarkable for coronary disease as described above.  She has had vascular disease in the past and has had previous SFA stenting, most recent angiogram June 2012 suggested moderate in- stent restenosis and plan is for medical therapy.  Her last ejection fraction by Myoview, May 2012, was 50%.  She  has type 2 insulin- dependent diabetes.  She has treated hypertension.  She has treated hypothyroidism.  She has paroxysmal atrial fibrillation and is on amiodarone 200 mg b.i.d.  She has treated dyslipidemia.  She has a history of gastroesophageal reflux.  She has a chronic right bundle- branch block.  She has had a past history of diverticulosis.  She does carry a diagnosis of lymphoma, although I do not have the details of this.  She has DJD of the lumbar spine.  She has gastroesophageal reflux.  She has had previous nephrolithiasis.  PREVIOUS SURGERIES:  Cholecystectomy, right hip replacement, a right knee replacement.  She has had a prior hysterectomy.  CURRENT MEDICATIONS: 1. Ranexa 500 mg daily. 2. Iron 325 mg a day. 3. Diltiazem 240 mg a day. 4. Aspirin 81 mg a day. 5. Pacerone 200 mg b.i.d. 6. Imdur 90 mg a day. 7. Neurontin 400 mg t.i.d. 8. Pletal 50 mg b.i.d. 9. Lasix 40 mg daily. 10.Metoprolol 75 mg b.i.d. 11.Pravastatin 40 mg at bedtime. 12.Potassium 10 mEq daily. 13.Plavix 75 mg a day. 14.Trilipix 135 mg a day.  15.Metformin 500 mg b.i.d. 16.Benazepril 10 mg a day. 17.Levothyroxine 0.075 mg a day. 18.Humulin insulin 50 units in the morning, 45 units at bedtime, I     believe this is Humulin N.  ALLERGIES:  She is allergic to PENICILLIN, VALIUM, and IV CONTRAST all of which caused a rash.  SOCIAL HISTORY:  She is married, she and her husband adopted 4 children. They have 3 living.  She has 7 grandchildren and 4 great-grandchildren. She is an ex-smoker, quit in 2005.  FAMILY HISTORY:  Remarkable that her mother, father, and brother all have coronary disease at a young age.  REVIEW OF SYSTEMS:  Essentially unremarkable except for noted above, she denies any GI bleeding or melena.  She has not had recent fever or chills or illness.  PHYSICAL EXAMINATION:  VITAL SIGNS:  Blood pressure 167/74, pulse 68, temperature 98.4. GENERAL:  She is a well-developed,  elderly female in no acute distress. HEENT:  Normocephalic.  Extraocular movements are intact.  Sclerae are nonicteric.  She does wear glasses. NECK:  Without JVD or bruit. CHEST:  Reveals a few crackles at the bases, otherwise clear lung fields. CARDIAC:  Reveals diminished heart sounds with regular rate and rhythm. ABDOMEN:  Obese, nontender.  She has a right upper quadrant surgical scar. EXTREMITIES:  Reveal no edema.  Distal pulses are diminished bilaterally. NEURO:  Grossly intact.  She is awake, alert and oriented, cooperative. Moves all extremities without obvious deficit. SKIN:  Cool and dry.  LABORATORY DATA:  White count 7.6, hemoglobin 11.7, hematocrit 33.6, platelets 269,000.  Sodium 139, potassium 4.1, BUN 22, creatinine 1.26 with a GFR of 41.  BNP is 428.  Urinalysis suggest UTI with positive nitrites and bacteria.  Chest x-ray shows cardiomegaly but no edema, there is an incidental finding on the portable chest x-ray of a right lower lobe nodule, she needs a PA and lateral chest x-ray.  EKG shows sinus rhythm, sinus bradycardia with a right bundle-branch block.  IMPRESSION: 1. Unstable angina. 2. Known coronary disease with coronary artery bypass grafting in     1989, percutaneous coronary intervention in 2003, last cathed July     2009 with plans for medical therapy.  Myoview May 2012 was abnormal     but overall felt to be low risk. 3. Ejection fraction of 50% by [facility] May 2012. 4. Lower extremity vascular disease with multiple interventions on her     superficial femoral artery in the past, most recent angiogram June     2012, plans for medical therapy. 5. Type 2 insulin-dependent diabetes with neuropathy. 6. Treated hypertension. 7. Treated hypothyroidism. 8. Treated dyslipidemia. 9. Paroxysmal atrial fibrillation, she is on b.i.d. at amiodarone 200     mg twice a day. 10.Gastroesophageal reflux. 11.Right bundle-branch block.  PLAN:  The patient will be  admitted to telemetry, started on IV heparin. She will be continued on her topical nitrates.  Further evaluation including the possibility of diagnostic catheterization will be discussed with the attending MD.     Abelino Derrick, P.A.   ______________________________ Nanetta Batty, M.D.    LKK/MEDQ  D:  11/15/2010  T:  11/15/2010  Job:  [id number]  cc:   Nanetta Batty, M.D.  Electronically Signed by Corine Shelter P.A. on 11/16/2010 06:08:59 PM Electronically Signed by Nanetta Batty M.D. on 11/17/2010 11:53:06 AM

## 2010-11-18 LAB — THYROID ANTIBODIES
Thyroglobulin Ab: <20 U/mL (ref <40.0)
Thyroperoxidase Ab SerPl-aCnc: <10 IU/mL (ref <35.0)

## 2010-11-18 LAB — CBC
Hemoglobin: 10.1 g/dL — ABNORMAL LOW (ref 12.0–15.0)
MCH: 32.3 pg (ref 26.0–34.0)
MCHC: 34 g/dL (ref 30.0–36.0)
MCV: 94.9 fL (ref 78.0–100.0)
RBC: 3.13 MIL/uL — ABNORMAL LOW (ref 3.87–5.11)

## 2010-11-18 LAB — BASIC METABOLIC PANEL
BUN: 20 mg/dL (ref 6–23)
CO2: 26 mEq/L (ref 19–32)
Calcium: 8.8 mg/dL (ref 8.4–10.5)
Chloride: 101 mEq/L (ref 96–112)
Creatinine, Ser: 1.15 mg/dL — ABNORMAL HIGH (ref 0.50–1.10)

## 2010-11-18 LAB — GLUCOSE, CAPILLARY
Glucose-Capillary: 115 mg/dL — ABNORMAL HIGH (ref 70–99)
Glucose-Capillary: 253 mg/dL — ABNORMAL HIGH (ref 70–99)
Glucose-Capillary: 284 mg/dL — ABNORMAL HIGH (ref 70–99)
Glucose-Capillary: 234 mg/dL — ABNORMAL HIGH (ref 70–99)

## 2010-11-19 LAB — CBC
MCH: 32.8 pg (ref 26.0–34.0)
MCHC: 34.1 g/dL (ref 30.0–36.0)
MCV: 96.1 fL (ref 78.0–100.0)
Platelets: 259 10*3/uL (ref 150–400)
RBC: 3.05 MIL/uL — ABNORMAL LOW (ref 3.87–5.11)
RDW: 13.9 % (ref 11.5–15.5)

## 2010-11-19 LAB — GLUCOSE, CAPILLARY
Glucose-Capillary: 105 mg/dL — ABNORMAL HIGH (ref 70–99)
Glucose-Capillary: 124 mg/dL — ABNORMAL HIGH (ref 70–99)

## 2010-11-19 LAB — BASIC METABOLIC PANEL
CO2: 27 mEq/L (ref 19–32)
Calcium: 8.8 mg/dL (ref 8.4–10.5)
Creatinine, Ser: 1.01 mg/dL (ref 0.50–1.10)

## 2010-11-20 LAB — GLUCOSE, CAPILLARY: Glucose-Capillary: 145 mg/dL — ABNORMAL HIGH (ref 70–99)

## 2010-11-20 LAB — BASIC METABOLIC PANEL
Calcium: 8.7 mg/dL (ref 8.4–10.5)
Chloride: 103 mEq/L (ref 96–112)
Creatinine, Ser: 1.43 mg/dL — ABNORMAL HIGH (ref 0.50–1.10)
GFR calc Af Amer: 40 mL/min — ABNORMAL LOW (ref >90)
GFR calc non Af Amer: 34 mL/min — ABNORMAL LOW (ref >90)

## 2010-11-20 LAB — CBC
MCH: 32.5 pg (ref 26.0–34.0)
MCHC: 33.7 g/dL (ref 30.0–36.0)
MCV: 96.5 fL (ref 78.0–100.0)
Platelets: 262 10*3/uL (ref 150–400)
RDW: 14.2 % (ref 11.5–15.5)
WBC: 9.6 10*3/uL (ref 4.0–10.5)

## 2010-11-20 NOTE — Cardiovascular Report (Signed)
NAME:  [name], [name] NO.:  [id number]  MEDICAL RECORD NO.:  [id number]  LOCATION:                                 FACILITY:  PHYSICIAN:  Nicki Guadalajara, M.D.     DATE OF BIRTH:  02-15-1932  DATE OF PROCEDURE:  11/18/2010 DATE OF DISCHARGE:                           CARDIAC CATHETERIZATION   PROCEDURE:  Left heart catheterization:  Cine coronary angiography; selective angiography into the saphenous vein graft; selective angiography into left internal mammary artery; biplane selective left ventriculography; distal aortography.  INDICATIONS:  [name] is a 77 year old female who has known coronary artery disease and underwent CABG revascularization surgery in 1989 with a LIMA to the LAD, a vein graft to the marginal, and vein graft to the right coronary artery.  In October 2003, she underwent cutting balloon arthrotomy of her LIMA to LAD.  At last catheterization in 2009, she did have an occluded vein graft to her right coronary artery.  She has peripheral vascular disease and is status post bilateral SFA stenting.  She has history of hypertension, hyperlipidemia.  She presented to [facility] with recurrent episodes of chest pain on November 15, 2010 suggestive of possible unstable angina.  Medications were increased.  She now presents for definitive diagnostic cardiac catheterization.  PROCEDURE:  After premedication with Versed 2 mg plus fentanyl 25 mcg, the patient was prepped and draped in usual fashion.  Her right femoral artery was punctured anteriorly and a 5-French sheath was inserted without difficulty.  Diagnostic cardiac catheterization was done utilizing 5-French Judkins for left and right catheters.  The right catheter was also used for selective angiography into the vein grafts. A LIMA catheter 5-French was used for selective angiography into left internal mammary artery.  Exchange wire technique was used with a long wire  due to the patient's PVD for all catheter exchanges.  A 5-French pigtail catheter was used for biplane cine left ventriculography. Distal aortography was also performed to further evaluate renal arteries and peripheral vascular disease.  HEMODYNAMIC DATA:  Central aortic pressure was 173/74.  Left ventricular pressure 173/18, post A-wave 28.  ANGIOGRAPHIC DATA:  There was severe coronary calcification of both the left and right coronary system.  The left main coronary artery was free of significant disease and bifurcated into an LAD and left circumflex system.  The LAD had 70-80% very proximal eccentric stenosis before the takeoff of the first septal perforating artery.  The diagonal vessel was 99% stenosed after the septal perforator and the LAD was occluded.  The septal perforator was large-caliber vessel and collateralization to the PDA and distal RCA was present via this proximal septal perforating artery.  The circumflex vessel was totally occluded very proximally near the ostium.  The right coronary artery had 99 followed by 100% occlusion proximally.  The vein graft supplying the circumflex marginal vessel was patent. This anastomosed into the proximal third of the vein graft.  Distal to the vein graft, the marginal was free of significant disease.  However, in the portion retrograde to the vein graft extending back into the AV groove circumflex, there was 80-90% proximal OM1 stenosis.  In the AV groove circumflex, there was  50% stenosis and at the ostium of the OM2 vessel there was 95% stenosis.  The OM vessel gave rise to 1 additional marginal vessel.  The vein graft supplying the right coronary artery was totally occluded at its origin.  The left internal mammary artery graft was patent.  There was smooth 20% narrowing at the ostium.  The LIMA anastomosed into the mid LAD.  The distal LAD was small caliber and diffusely diseased with narrowing at 80%.  This did  improve following IC nitroglycerin administration.  The distal portion of the LAD wrapped around the apex and again was very small vessel with 90% stenosis.  There was collateralization to the distal RCA from septal perforating arteries arising proximal to the LIMA insertion.  There was faint collateralization also of the diagonal vessel from this LIMA graft.  Biplane selective left ventriculography revealed mild LV dysfunction with an EF of approximately 45%.  There was mid to basal inferior hypocontractility on the RAO projection.  On the LAO projection, part of the apex was calcified.  There was severe hypokinesis of the inferoseptal apical and apical inferolateral segment.  Distal aortography revealed patent renal arteries.  The left common iliac was mildly aneurysmal.  There was mild irregularity of the proximal right common iliac with plaque without high-grade stenosis.  IMPRESSION: 1. Mild to moderate left ventricular dysfunction with moderate     hypocontractility in the mid to basal inferior wall and significant     hypokinesis with calcification noted in the inferoapical segment. 2. Severe native coronary artery disease with 70-80% very proximal     left anterior descending stenosis followed by 99% stenosis of the     left anterior descending diagonal system after the first septal     perforator artery with total occlusion of proximal left anterior     descending after the system. 3. Total proximal left circumflex occlusion. 4. Total proximal native right coronary artery occlusion. 5. Old saphenous vein graft occlusion to the vein grafts which     previously supplied the right coronary artery. 6. Patent vein graft supplying the obtuse marginal 1 vessel of the     circumflex coronary artery with normal flow distally.  However,     there is 80-90% narrowing antegrade to the vein graft insertion in     the very proximal obtuse marginal 1 vessel which jeopardizes flow     down  the distal arteriovenous groove circumflex and evidence for     50% narrowing in the arteriovenous groove circumflex between the     obtuse marginal 1 and obtuse marginal 2 with 95% ostial obtuse     marginal 2 stenosis. 7. Patent left internal mammary artery to the left anterior descending     with 20% proximal narrowing.  There is diffuse 80% distal left     anterior descending stenosis of small-caliber vessel which did     improve with IC nitroglycerin administration.  There is     collateralization of the distal right coronary artery both via     proximal left anterior descending septal perforating arteries as     well as distal left anterior descending septal perforating     arteries. 8. No evidence for renal artery stenosis.  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  RECOMMENDATIONS:  Increased medical therapy will be initially recommended.          ______________________________ Nicki Guadalajara, M.D.     TK/MEDQ  D:  11/18/2010  T:  11/18/2010  Job:  [id number]  Electronically Signed by Nicki Guadalajara M.D. on 11/20/2010 01:07:18 PM

## 2010-11-21 ENCOUNTER — Inpatient Hospital Stay (HOSPITAL_COMMUNITY): Payer: Medicare Other

## 2010-11-21 LAB — CBC
HCT: 31.7 % — ABNORMAL LOW (ref 36.0–46.0)
Hemoglobin: 10.9 g/dL — ABNORMAL LOW (ref 12.0–15.0)
MCH: 32.9 pg (ref 26.0–34.0)
MCV: 95.8 fL (ref 78.0–100.0)
RBC: 3.31 MIL/uL — ABNORMAL LOW (ref 3.87–5.11)

## 2010-11-21 LAB — BASIC METABOLIC PANEL
CO2: 27 mEq/L (ref 19–32)
Calcium: 9 mg/dL (ref 8.4–10.5)
Chloride: 102 mEq/L (ref 96–112)
Creatinine, Ser: 1.35 mg/dL — ABNORMAL HIGH (ref 0.50–1.10)
Glucose, Bld: 237 mg/dL — ABNORMAL HIGH (ref 70–99)

## 2010-11-21 LAB — GLUCOSE, CAPILLARY
Glucose-Capillary: 212 mg/dL — ABNORMAL HIGH (ref 70–99)
Glucose-Capillary: 198 mg/dL — ABNORMAL HIGH (ref 70–99)

## 2010-11-22 LAB — CBC
MCHC: 33.9 g/dL (ref 30.0–36.0)
MCV: 95.5 fL (ref 78.0–100.0)
Platelets: 270 10*3/uL (ref 150–400)
RDW: 13.8 % (ref 11.5–15.5)
WBC: 6.9 10*3/uL (ref 4.0–10.5)

## 2010-11-22 LAB — BASIC METABOLIC PANEL
BUN: 25 mg/dL — ABNORMAL HIGH (ref 6–23)
Calcium: 9.1 mg/dL (ref 8.4–10.5)
GFR calc non Af Amer: 30 mL/min — ABNORMAL LOW (ref >90)
Glucose, Bld: 142 mg/dL — ABNORMAL HIGH (ref 70–99)

## 2010-11-22 LAB — GLUCOSE, CAPILLARY: Glucose-Capillary: 72 mg/dL (ref 70–99)

## 2010-11-23 LAB — MAGNESIUM: Magnesium: 2.2 mg/dL (ref 1.5–2.5)

## 2010-11-23 LAB — GLUCOSE, CAPILLARY
Glucose-Capillary: 109 mg/dL — ABNORMAL HIGH (ref 70–99)
Glucose-Capillary: 24 mg/dL — CL (ref 70–99)
Glucose-Capillary: 138 mg/dL — ABNORMAL HIGH (ref 70–99)
Glucose-Capillary: 258 mg/dL — ABNORMAL HIGH (ref 70–99)

## 2010-11-23 LAB — CBC
MCV: 95.7 fL (ref 78.0–100.0)
Platelets: 322 10*3/uL (ref 150–400)
RBC: 3.46 MIL/uL — ABNORMAL LOW (ref 3.87–5.11)
WBC: 7.1 10*3/uL (ref 4.0–10.5)

## 2010-11-23 LAB — BASIC METABOLIC PANEL
CO2: 29 mEq/L (ref 19–32)
Chloride: 96 mEq/L (ref 96–112)
Creatinine, Ser: 1.64 mg/dL — ABNORMAL HIGH (ref 0.50–1.10)

## 2010-11-24 LAB — URINALYSIS, DIPSTICK ONLY
Bilirubin Urine: NEGATIVE
Ketones, ur: NEGATIVE mg/dL
Nitrite: NEGATIVE
Protein, ur: NEGATIVE mg/dL
Urobilinogen, UA: 0.2 mg/dL (ref 0.0–1.0)
pH: 6 (ref 5.0–8.0)

## 2010-11-24 LAB — GLUCOSE, CAPILLARY
Glucose-Capillary: 342 mg/dL — ABNORMAL HIGH (ref 70–99)
Glucose-Capillary: 270 mg/dL — ABNORMAL HIGH (ref 70–99)
Glucose-Capillary: 249 mg/dL — ABNORMAL HIGH (ref 70–99)

## 2010-11-24 LAB — PRO B NATRIURETIC PEPTIDE: Pro B Natriuretic peptide (BNP): 757.7 pg/mL — ABNORMAL HIGH (ref 0–450)

## 2010-11-24 LAB — CBC
Platelets: 318 10*3/uL (ref 150–400)
RDW: 14 % (ref 11.5–15.5)
WBC: 7.1 10*3/uL (ref 4.0–10.5)

## 2010-11-24 LAB — BASIC METABOLIC PANEL
BUN: 38 mg/dL — ABNORMAL HIGH (ref 6–23)
Calcium: 9.5 mg/dL (ref 8.4–10.5)
Chloride: 96 mEq/L (ref 96–112)
Creatinine, Ser: 1.89 mg/dL — ABNORMAL HIGH (ref 0.50–1.10)
GFR calc Af Amer: 28 mL/min — ABNORMAL LOW (ref >90)
GFR calc Af Amer: 28 mL/min — ABNORMAL LOW (ref >90)
Potassium: 5.7 mEq/L — ABNORMAL HIGH (ref 3.5–5.1)
Sodium: 133 mEq/L — ABNORMAL LOW (ref 135–145)

## 2010-11-25 ENCOUNTER — Inpatient Hospital Stay (HOSPITAL_COMMUNITY): Payer: Medicare Other

## 2010-11-25 LAB — COMPREHENSIVE METABOLIC PANEL
ALT: 16 U/L (ref 0–35)
AST: 29 U/L (ref 0–37)
CO2: 24 mEq/L (ref 19–32)
Calcium: 9.4 mg/dL (ref 8.4–10.5)
Creatinine, Ser: 1.71 mg/dL — ABNORMAL HIGH (ref 0.50–1.10)
GFR calc non Af Amer: 28 mL/min — ABNORMAL LOW (ref >90)
Sodium: 133 mEq/L — ABNORMAL LOW (ref 135–145)
Total Protein: 6.5 g/dL (ref 6.0–8.3)

## 2010-11-25 LAB — GLUCOSE, CAPILLARY
Glucose-Capillary: 155 mg/dL — ABNORMAL HIGH (ref 70–99)
Glucose-Capillary: 189 mg/dL — ABNORMAL HIGH (ref 70–99)

## 2010-11-25 LAB — URINE CULTURE
Colony Count: NO GROWTH
Culture  Setup Time: 201210080223

## 2010-11-25 LAB — CBC
MCH: 32.7 pg (ref 26.0–34.0)
MCHC: 34.3 g/dL (ref 30.0–36.0)
MCV: 95.3 fL (ref 78.0–100.0)
Platelets: 355 10*3/uL (ref 150–400)
RDW: 13.8 % (ref 11.5–15.5)

## 2010-11-26 ENCOUNTER — Inpatient Hospital Stay (HOSPITAL_COMMUNITY): Payer: Medicare Other

## 2010-11-26 ENCOUNTER — Other Ambulatory Visit (HOSPITAL_COMMUNITY): Payer: Medicare Other

## 2010-11-26 ENCOUNTER — Encounter (HOSPITAL_COMMUNITY): Payer: Self-pay

## 2010-11-26 DIAGNOSIS — K56609 Unspecified intestinal obstruction, unspecified as to partial versus complete obstruction: Secondary | ICD-10-CM

## 2010-11-26 LAB — CBC
MCH: 32.2 pg (ref 26.0–34.0)
MCHC: 33.9 g/dL (ref 30.0–36.0)
MCV: 95.2 fL (ref 78.0–100.0)
Platelets: 305 10*3/uL (ref 150–400)
RDW: 13.8 % (ref 11.5–15.5)

## 2010-11-26 LAB — GLUCOSE, CAPILLARY
Glucose-Capillary: 203 mg/dL — ABNORMAL HIGH (ref 70–99)
Glucose-Capillary: 76 mg/dL (ref 70–99)
Glucose-Capillary: 57 mg/dL — ABNORMAL LOW (ref 70–99)

## 2010-11-26 LAB — BASIC METABOLIC PANEL
CO2: 24 mEq/L (ref 19–32)
Calcium: 8.6 mg/dL (ref 8.4–10.5)
Creatinine, Ser: 2.23 mg/dL — ABNORMAL HIGH (ref 0.50–1.10)
GFR calc non Af Amer: 20 mL/min — ABNORMAL LOW (ref >90)
Glucose, Bld: 157 mg/dL — ABNORMAL HIGH (ref 70–99)

## 2010-11-27 ENCOUNTER — Inpatient Hospital Stay (HOSPITAL_COMMUNITY): Payer: Medicare Other

## 2010-11-27 LAB — PRO B NATRIURETIC PEPTIDE: Pro B Natriuretic peptide (BNP): 323.2 pg/mL (ref 0–450)

## 2010-11-27 LAB — CBC
HCT: 29.4 % — ABNORMAL LOW (ref 36.0–46.0)
Hemoglobin: 10 g/dL — ABNORMAL LOW (ref 12.0–15.0)
MCH: 32.4 pg (ref 26.0–34.0)
MCHC: 34 g/dL (ref 30.0–36.0)
MCV: 95.1 fL (ref 78.0–100.0)
Platelets: 279 10*3/uL (ref 150–400)
RBC: 3.09 MIL/uL — ABNORMAL LOW (ref 3.87–5.11)
RDW: 13.8 % (ref 11.5–15.5)
WBC: 11.2 10*3/uL — ABNORMAL HIGH (ref 4.0–10.5)

## 2010-11-27 LAB — COMPREHENSIVE METABOLIC PANEL
AST: 18 U/L (ref 0–37)
Albumin: 2.6 g/dL — ABNORMAL LOW (ref 3.5–5.2)
Calcium: 8.4 mg/dL (ref 8.4–10.5)
Chloride: 96 mEq/L (ref 96–112)
Creatinine, Ser: 1.7 mg/dL — ABNORMAL HIGH (ref 0.50–1.10)
Total Bilirubin: 0.3 mg/dL (ref 0.3–1.2)

## 2010-11-27 LAB — GLUCOSE, CAPILLARY: Glucose-Capillary: 321 mg/dL — ABNORMAL HIGH (ref 70–99)

## 2010-11-28 ENCOUNTER — Inpatient Hospital Stay (HOSPITAL_COMMUNITY): Payer: Medicare Other

## 2010-11-28 LAB — CBC
HCT: 29.7 % — ABNORMAL LOW (ref 36.0–46.0)
Hemoglobin: 10 g/dL — ABNORMAL LOW (ref 12.0–15.0)
MCH: 32.1 pg (ref 26.0–34.0)
MCHC: 33.7 g/dL (ref 30.0–36.0)
MCV: 95.2 fL (ref 78.0–100.0)

## 2010-11-28 LAB — BASIC METABOLIC PANEL
BUN: 21 mg/dL (ref 6–23)
Calcium: 8.8 mg/dL (ref 8.4–10.5)
GFR calc non Af Amer: 42 mL/min — ABNORMAL LOW (ref >90)
Glucose, Bld: 171 mg/dL — ABNORMAL HIGH (ref 70–99)

## 2010-11-28 LAB — GLUCOSE, CAPILLARY: Glucose-Capillary: 175 mg/dL — ABNORMAL HIGH (ref 70–99)

## 2010-11-29 LAB — BASIC METABOLIC PANEL WITH GFR
BUN: 14 mg/dL (ref 6–23)
CO2: 23 meq/L (ref 19–32)
Calcium: 8.9 mg/dL (ref 8.4–10.5)
Chloride: 104 meq/L (ref 96–112)
Creatinine, Ser: 1.03 mg/dL (ref 0.50–1.10)
GFR calc Af Amer: 59 mL/min — ABNORMAL LOW
GFR calc non Af Amer: 51 mL/min — ABNORMAL LOW
Glucose, Bld: 185 mg/dL — ABNORMAL HIGH (ref 70–99)
Potassium: 3.9 meq/L (ref 3.5–5.1)
Sodium: 136 meq/L (ref 135–145)

## 2010-11-29 LAB — I-STAT 8, (EC8 V) (CONVERTED LAB)
Acid-Base Excess: 1
Bicarbonate: 27.8 — ABNORMAL HIGH
Potassium: 4
TCO2: 29
pCO2, Ven: 51.4 — ABNORMAL HIGH
pH, Ven: 7.341 — ABNORMAL HIGH

## 2010-11-29 LAB — CBC
MCHC: 34.2 g/dL (ref 30.0–36.0)
MCV: 96.1 fL (ref 78.0–100.0)
Platelets: 285 10*3/uL (ref 150–400)
RDW: 13.6 % (ref 11.5–15.5)
WBC: 7.6 10*3/uL (ref 4.0–10.5)

## 2010-11-29 LAB — GLUCOSE, CAPILLARY: Glucose-Capillary: 162 mg/dL — ABNORMAL HIGH (ref 70–99)

## 2010-11-29 NOTE — Discharge Summary (Signed)
NAMEMarland Kitchen  Sara, [name]:  [id number]  MEDICAL RECORD NO.:  [id number]  LOCATION:  2002                         FACILITY:  [facility]  PHYSICIAN:  Landry Corporal, MD DATE OF BIRTH:  [birth year]  DATE OF ADMISSION:  11/15/2010 DATE OF DISCHARGE:  11/29/2010                              DISCHARGE SUMMARY   DISCHARGE DIAGNOSES: 1. Acute on chronic systolic heart failure, ejection fraction noted at     45% by cardiac catheterization. 2. Coronary artery disease, history of coronary bypass grafting status     post left heart catheterization this admission on November 17, 2009.     Areas of stenosis, however, nothing amenable to percutaneous     coronary intervention. 3. Diverticulitis, on Cipro and Flagyl. 4. Constipation, resolved. 5. Hyperglycemia, improved. 6. Hyponatremia, improved. 7. Acute renal failure, improved. 8. Nausea, vomiting, abdominal pain, resolved. 9. Hypokalemia, resolved. 10.Unstable angina, negative myocardial infarction. 11.Insulin-dependent diabetes mellitus. 12.Leukocytosis, resolved.  HOSPITAL COURSE:  [name] is a 77 year old Caucasian female with a history of coronary artery disease and diabetes as well as bypass grafting in 1989.  History also includes peripheral vascular disease status post SFA stenting and angiogram on July 20, 2010 suggesting moderate in-stent restenosis with a plan for medical therapy.  She has insulin-dependent type 2 diabetes, hypertension, hypothyroidism, paroxysmal atrial fibrillation, dyslipidemia, gastroesophageal reflux disease, chronic right bundle-branch block,  diverticulosis, and degenerative joint disease of the spine.  She did have a Myoview stress test in May 2012.  This suggested a moderate ischemia in the mid anterior and anteroapical regions and was otherwise felt to be low risk compared to previous report.  She was admitted due to shortness of breath. She also became mildly diaphoretic and  had a vague sensation of chest pressure.  She was admitted with unstable angina to telemetry unit, started on IV heparin and started on topical nitrates.  She was scheduled for left heart catheterization on November 18, 2010, see below for details. She continued on the second without any chest pain.  She did have some basilar crackles on exam and was given extra dose of Lasix.  On November 20, 2010, she had hypoglycemic event with a CBG of 57. The patient was asymptomatic at that time and her Lantus was decreased to 35 units.  She did have some good diuresis at 850 mL out on November 20, 2010.  PT evaluation was also ordered.  The patient apparently was a woken on November 21, 2010 with paroxysmal nocturnal dyspnea.  No additional adjustments were made to her diuretics.  BNP was elevated at 1048 on November 22, 2010, this was elevated from the 428 initially seen on November 15, 2010.  We also decreased her amiodarone to 200 mg daily.  She also complained of some nausea on November 22, 2010.  Pletal was discontinued and hydralazine was increased.  Physical therapy evaluation was completed on November 22, 2010.  The patient was also set up for advanced home care, skilled nursing, and physical therapy.  She had another hypoglycemic episode, this time CBG was 24.  She responded well to D50 and subsequent check of CBG was 165 and  her NPH dose was decreased.  On  November 24, 2010, the patient complained of nausea and vomiting and received 4 mg of Zofran.  Also on November 24, 2010, she complained that she could not move her arms and legs.  She had hyperglycemia episode with 5.7.  She received Kayexalate for that.  Her diuretics were also held due to increasing creatinine.  On November 25, 2010, she complained of abdominal pain which was generalized and also focused in the suprapubic region with positive nausea.  KUB was ordered and she was switched to a clear liquid diet.  Also continued Zofran.  As she was  noted to be passing gas by the nursing staff, they added MiraLax.  According to the patient, she had not had a bowel movement for 10 days.  She has been noted increase in white count to 18.1 from 7.1. An abdominal x-ray showed mild transverse colon ileus or partial obstruction.  CT of the abdomen and pelvis was completed on November 26, 2010 that showed sigmoid diverticulosis with subtle hyperemia with infiltration on the sigmoid mesocolon raising question for subtle acute diverticulitis.  Radiodense liver which can be seen with iron overload hemochromatosis.  Small myelolipoma, left adrenal gland.  There is extensive atherosclerotic calcification.  GI consult is also requested. She was also made n.p.o. and a surgical consult was requested.  On the night of November 26, 2010, the patient had 2 large bowel movements.  The patient had no relief from stomach cramps.  The patient's Ranexa was decreased to 500 mg b.i.d.  The patient was started on November 27, 2010 on IV Cipro and IV Flagyl for diverticulitis.  On the morning of November 28, 2010, the patient noticed she felt much better without chest pain, shortness of breath, or abdominal pain.  Her white count had decreased to 8.2, blood pressure and heart rate were stable.  Her diet was advanced to low-sodium carbohydrate modified.  Repeat abdominal x-ray showed nonobstructive bowel gas pattern, this was completed on November 28, 2010.  The patient has tolerated the advance in diet well and as of November 29, 2010, she feels she is doing better.  She has been seen by Dr. Herbie Baltimore who feels she is stable for discharge home.  DISCHARGE LABORATORY DATA:  WBC 7.6, hemoglobin 10.0, hematocrit 29.2, and platelets 285.  Sodium 136, potassium 3.9, chloride 104, carbon dioxide 23, BUN 14, creatinine 1.03, and glucose 185.  Total bilirubin 0.3, alkaline phosphatase was 38, AST 18, ALT 10, total protein 5.4, and albumin 2.6.  Last BNP was 323.2.  D-dimer was  elevated at 0.93. Hemoglobin A1c was 7.4.  Cardiac enzymes were negative x3.  Total cholesterol 144, triglycerides 105, HDL 45, LDL 78, VLDL 21 with a total cholesterol and HDL ratio of 3.2.  TSH 6.213 with a free T3 of 1.7 and T4 of 7.5.  Thyroglobulin antibody less than 20.0 and thyroid peroxidase antibody less than 10.0.  STUDIES/PROCEDURES: 1. Nuclear medicine pulmonary ventilation scan. Studies consistent     with very low probability of pulmonary embolism. 2. Chest x-ray, November 21, 2010, showed probable small bilateral     pleural effusions with stable cardiomegaly and vascular congestion.     Most recent abdominal x-ray showed nonobstructive bowel gas     pattern. 3. CT of the abdomen and pelvis on November 26, 2010 showed sigmoid     diverticulosis with subtle hyperemia and infiltration in the     sigmoid mesocolon, raising question for subtle acute     diverticulitis.  Radiodense liver.  Small myelolipoma, left adrenal     gland with extensive atherosclerotic calcification. 4. Cardiac catheterization, November 18, 2010.  Impression:  Mild-to-     moderate left ventricular dysfunction with moderate     hypocontractility in the mid-to-basal inferior wall with     significant hypokinesis with calcification noted in the inferior     apical segment.  Severe native coronary artery disease with 70-80%     very proximal left anterior descending stenosis followed by 99%     stenosis of the left anterior descending diagonal system after the     first septal perforator artery and total occlusion of the proximal     left anterior descending are consistent.  Total proximal left     circumflex occlusion.  Total proximal native right coronary     occlusion.  Old saphenous vein graft occlusion to the vein grafts     which previously supplied the right coronary artery.  Patent vein     graft supplying the obtuse marginal 1 vessel and left circumflex     coronary artery with normal flow distally.   However, there was 80-     90% narrowing antegrade to the vein graft insertion and very     proximal obtuse marginal 1 vessel which supplies flow down the     distal arteriovenous groove circumflex and evidence for 50% from     the arteriovenous groove circumflex between obtuse marginal 1 and     obtuse marginal 2 with 95% ostial obtuse marginal stenosis.  Patent     left internal mammary artery to the left anterior descending with     20% proximal narrowing.  There is diffuse 80% distal left anterior     descending stenosis of small caliber vessels which did not improved     with IC nitroglycerin administration.  There was collateralization     of the distal right coronary artery via both proximal left anterior     descending septal perforator arteries as well as the distal left     anterior descending septal perforator arteries.  There is no     evidence of renal artery stenosis.  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  Recommendations were for     medical therapy.  Ejection fraction noted at 45%.  DISCHARGE MEDICATIONS: 1. Acetaminophen 325 mg 2 tablets by mouth every 4 hours as needed for     pain. 2. Amiodarone 200 mg 1 tablet by mouth daily. 3. Amlodipine 10 mg 1 tablet by mouth daily. 4. Aspirin 325 mg 1 tablet by mouth daily. 5. Benazepril 5 mg 1 tablet by mouth daily. 6. Cipro 500 mg 1 tablet by mouth daily. 7. Flagyl 500 mg 1 tablet by mouth twice daily. 8. Furosemide 40 mg 1 tablet by mouth daily. 9. Hydralazine 25 mg 1 tablet by mouth 3 times a day. 10.Metoprolol 100 mg 1 tablet by mouth twice daily. 11.Nitroglycerin sublingual 0.4 mg 1 tablet under the tongue every 5     minutes up to 3 tablets for chest pain. 12.Potassium chloride 10 mEq 2 tablets by mouth daily. 13.Ranolazine 500 mg 1 tablet by mouth twice daily. 14.Ferrous sulfate 65 mg 1 tablet by mouth daily. 15.Gabapentin 400 mg 1 capsule by mouth 3 times a day. 16.Humulin N 50  units every morning. 17.Humulin N 45 units per sliding scale in the evening. 18.Ibuprofen 400 mg 2 tablets by mouth every 8 hours as needed for  pain. 19.Imdur 60 mg one and 1/2 tablets by mouth daily. 20.Metformin 500 mg 1 tablet by mouth twice daily. 21.Plavix 75 mg 1 tablet by mouth daily. 22.Pravastatin 40 mg 1 tablet by mouth daily at bedtime. 23.Synthroid 75 mcg 1 tablet by mouth daily. 24.Trilipix 135 mg 1 tablet by mouth daily.  DISPOSITION:  [name] will be discharged home in stable condition. It is recommend she increase her activity slowly.  It is recommend she eats a low-sodium heart healthy carbohydrate modified diet.  She will follow up with Dr. Allyson [name] and our office will call with the appointment time.  It is also recommended she weighs herself on a daily basis in the mornings and record her weight.  If she has 1-2 pounds weight gain in a day or 3-4 pounds in a week, she is to call our office for instructions. She also has been set up with home health skilled nursing and physical therapy.  She has also been instructed to follow up with her primary care doctor regarding her decrease in thyroid hormone for adjustment to her Synthroid.    ______________________________ Wilburt Finlay, PA-C  The patient was seen and examined on the day of discharge.  Her condition has improved,  and she is stable for discharge with home health.  ______________________________ Landry Corporal, MD   BH/MEDQ  D:  11/29/2010  T:  11/29/2010  Job:  [id number]  cc:   Loma Sender, MD  Electronically Signed by Wilburt Finlay PA on 11/29/2010 12:53:23 PM Electronically Signed by Bryan Lemma MD on 11/29/2010 07:14:10 PM

## 2010-12-05 LAB — PROTIME-INR
INR: 1.5
Prothrombin Time: 18.4 — ABNORMAL HIGH

## 2010-12-05 LAB — BASIC METABOLIC PANEL
BUN: 10
CO2: 30
CO2: 31
Calcium: 9.2
Chloride: 96
Chloride: 96
Creatinine, Ser: 0.79
Glucose, Bld: 156 — ABNORMAL HIGH
Glucose, Bld: 64 — ABNORMAL LOW
Potassium: 3.1 — ABNORMAL LOW

## 2010-12-10 NOTE — Consult Note (Signed)
NAMEMarland Kitchen  Sara, BABSON            ACCOUNT NO.:  [id number]  MEDICAL RECORD NO.:  [id number]  LOCATION:  2002                         FACILITY:  [facility]  PHYSICIAN:  Currie Paris, M.D.DATE OF BIRTH:  09-24-1931  DATE OF CONSULTATION:  11/26/2010 DATE OF DISCHARGE:                                CONSULTATION   REFERRING PHYSICIAN:  Nanetta Batty, MD  REASON FOR CONSULT:  Nausea and vomiting for 3 days.  BRIEF HISTORY:  The patient is a 77 year old white female with multiple medical issues as listed below.  She was admitted on November 15, 2010, with angina, subsequently undergone cardiac catheterization with no new significant findings.  She has had an ongoing course complicated by congestive heart failure both systolic and diastolic.  She has also had recurring low capillary blood glucoses requiring D50W.  She now had 3 days of morning nausea and vomiting.  It was initially thought to be related to her low glucoses.  She has also had an elevation in her creatinine and potassium, which has been treated since the onset of her heart failure.  Yesterday along with her nausea, she complained of some abdominal pain.  X-ray single view shows questionable ileus of transverse colon versus a partial small-bowel obstruction.  She also had an elevated white count with no known reason.  She had previously been treated for UTI on admission for 5 days with Bactrim.  She has had ongoing mild vague abdominal discomfort and she was subsequently made n.p.o. by Cardiology and we were asked to see in consultation. Currently, she is not complaining of nausea and did not complain this initially of abdominal pain.  PAST MEDICAL HISTORY: 1. Coronary artery disease.  Her last Myoview was May 2012. 2. History of PAF and right bundle-branch block. 3. Systolic and diastolic congestive heart failure. 4. Peripheral vascular occlusive disease. 5. Hypertension. 6. New onset diabetes mellitus insulin  dependent. 7. Hypothyroid. 8. Dyslipidemia. 9. History of diverticulosis. 10.History of lymphoma, she was treated seven years ago and apparently     was on her right neck. 11.Degenerative joint disease including lumbar spine. 12.History of nephrolithiasis.  PAST SURGICAL HISTORY: 1. CABG in 1989.  She has had multiple cardiac catheterizations, the     last catheterization November 18, 2010, showed an EF of 45%, 3-vessel     coronary disease with a patent saphenous vein graft to the OM and     patent LIMA to the LAD. 2. Status post cholecystectomy. 3. Right hip and right knee replacements. 4. Appendectomy. 5. She has had 2 GYN surgeries including a hysterectomy. 6. Colonoscopy 5 years ago.  She is 61 inches tall, 66 kg with a BMI of 27.  FAMILY HISTORY:  Both parents and her brothers have coronary artery disease.  SOCIAL HISTORY:  She smoked for few years, quit in 2005.  Alcohol, none. Drugs, none.  She is married and worked at [employer].  REVIEW OF SYSTEMS:  She is normally regular.  She says she has had no bowel movement in 8 days.  Of note, she had Kayexalate few days ago. Positive for GERD.  No diarrhea or constipation.  No blood in her stool. CV is negative for headaches, syncope,  or stroke.  CARDIAC:  Positive as above.  PULMONARY:  Positive for shortness of breath, recent congestive heart failure.  She has chronic dyspnea on exertion at home and here. GU:  No trouble voiding.  She was treated for UTI just after admission. Weight stable.  She says is fluid distended.  She has been eating regularly until Monday.  PSYCH:  Negative.  HOME MEDICATIONS: 1. Ranexa 500 mg daily. 2. Ferrous sulfate 325 mg daily. 3. Diltiazem 240 mg daily. 4. Aspirin 81 mg daily. 5. Pacerone 200 mg b.i.d. 6. Imdur 90 mg daily. 7. Neurontin 400 mg t.i.d. 8. Lasix 40 mg daily. 9. Pravastatin 40 mg daily. 10.Triplex 135 mg daily. 11.Metformin 500 mg b.i.d. 12.Levothyroxine 75 mcg  daily. 13.Insulin 50 units in a.m., 45 units bedtime.  ALLERGIES:  PENICILLIN, VALIUM, IV CONTRAST CAUSES A RASH.  PHYSICAL EXAM:  GENERAL:  This is a well-nourished overweight white female in no acute distress. VITAL SIGNS:  Temperature is 97.5.  She has been afebrile over the last 24-48 hours.  Heart rates in the 60s, blood pressure is 104/46, respiratory rate is 18, sats 95% on room air. HEENT:  Head, normocephalic.  Ears, nose, throat, and mouth, grossly within normal limits. NECK:  Trachea is in midline.  There is no bruits.  No JVD. CHEST:  Clear to auscultation.  Sternotomy incision is well-healed. CARDIAC:  Normal S1 and S2.  No murmurs or rubs. ABDOMEN:  Bowel sounds are hypoactive.  She is not distended.  She is slightly tender on palpation.  No abscesses, hernia, or masses. GU/RECTAL:  Deferred. LYMPHADENOPATHY:  None palpated. MUSCULOSKELETAL:  No changes. SKIN:  She has multiple sites of ecchymoses on both arms. NEUROLOGIC:  No focal deficits.  Cranial nerves II through XII are grossly intact. PSYCH:  Normal affect.  LABORATORY DATA:  Sodium is 131, potassium is 4.3, chloride is 95, CO2 is 24, BUN is 46, creatinine is 2.23 and creatinine was 1.35 on admission.  Urine culture, November 24, 2010, is negative.  White count, November 24, 2010, was 7100; November 25, 2010 was 18,100; November 26, 2010, 12,300.  Hemoglobin is 10.7, hematocrit is 31.6, platelets are 305,000.  DIAGNOSTICS:  Single-view, November 25, 2010 shows dilatation of the transverse colon.  Right hip prosthesis and right femoral stent. November 26, 2010, shows distention of the cecum in the proximal transverse colon.  The distal colon is decompressed.  IMPRESSION: 1. Questionable ileus of transverse colon with dilatation of uncertain     etiology. 2. Coronary artery disease with history of chest pain, congestive     heart failure, atrial fibrillation, right bundle-branch block,     cardiomyopathy with an EF of  45%. 3. Peripheral vascular occlusive disease. 4. New onset diabetes mellitus. 5. Progressive renal insufficiency after congestive heart failure. 6. New white count elevation of uncertain etiology. 7. Hypothyroid. 8. Dyslipidemia. 9. Hypertension.  PLAN:  She was made n.p.o. by Cardiology.  We are going to keep her n.p.o. for now.  I reviewed the films with Dr. Jamey Ripa and we have also reviewed them with x-ray and at that point, it was Dr. Tenna Child opinion that we should go ahead and get a CT with oral contrast to see if we can better define the process going on in the colon.  We are going to avoid IV contrast secondary to her renal insufficiency.  We will continue to follow with you.  I will review the CT as soon as it is done. The CT has  not  been done at the time of dictation.      [name], P.A.   ______________________________ Currie Paris, M.D.    WDJ/MEDQ  D:  11/26/2010  T:  11/26/2010  Job:  [id number]  cc:   Nanetta Batty, M.D.  Electronically Signed by Sherrie George P.A. on 12/08/2010 01:14:38 PM Electronically Signed by Cyndia Bent M.D. on 12/10/2010 10:26:13 AM

## 2011-02-28 ENCOUNTER — Encounter: Payer: Self-pay | Admitting: Vascular Surgery

## 2011-03-04 ENCOUNTER — Encounter: Payer: Self-pay | Admitting: Vascular Surgery

## 2011-03-05 ENCOUNTER — Ambulatory Visit: Payer: Medicare Other | Admitting: Vascular Surgery

## 2011-03-18 ENCOUNTER — Encounter: Payer: Self-pay | Admitting: Vascular Surgery

## 2011-03-19 ENCOUNTER — Encounter: Payer: Self-pay | Admitting: Vascular Surgery

## 2011-03-19 ENCOUNTER — Encounter (INDEPENDENT_AMBULATORY_CARE_PROVIDER_SITE_OTHER): Payer: Medicare Other | Admitting: *Deleted

## 2011-03-19 ENCOUNTER — Ambulatory Visit (INDEPENDENT_AMBULATORY_CARE_PROVIDER_SITE_OTHER): Payer: Medicare Other | Admitting: Vascular Surgery

## 2011-03-19 VITALS — BP 171/70 | HR 85 | Resp 20 | Ht 61.5 in | Wt 138.0 lb

## 2011-03-19 DIAGNOSIS — I70219 Atherosclerosis of native arteries of extremities with intermittent claudication, unspecified extremity: Secondary | ICD-10-CM

## 2011-03-19 DIAGNOSIS — I739 Peripheral vascular disease, unspecified: Secondary | ICD-10-CM

## 2011-03-19 NOTE — Assessment & Plan Note (Signed)
This patient has known multilevel arterial occlusive disease. She is followed by Dr. York Ram. She has evidence of inflow disease bilaterally and also infrainguinal arterial occlusive disease. From a surgical standpoint, she's had vein taken from her left leg for previous coronary revascularization. In addition she is felt to be high risk for surgery. She states that Dr. Allyson [name] had told her that she is not a good candidate for surgery. Her symptoms have progressed slightly but are still tolerable. She does not have limb threatening ischemia. I've encouraged her to stay as active as possible. Fortunately she is not a smoker. If her symptoms progress I've encouraged her to follow up sooner with Dr. Allyson [name] to possibly consider an endovascular approach. I'll see her back in one year. She knows to call sooner if she has problems.

## 2011-03-19 NOTE — Progress Notes (Signed)
[facility] [place]  Patient name: [name] MRN: [id number] DOB: March 05, 1931 Sex: female  REASON FOR VISIT: follow up of her peripheral vascular disease  HPI: [name] is a 78 y.o. female who I last saw in July of 2012. She has a proximal left common iliac artery stenosis with disease in the common femoral artery on the left an occluded superficial femoral artery with reconstitution of the below knee popliteal artery. At that time she had posterior tibial and peroneal artery runoff. She had no vein available on the left is she's had vein taken from the left leg for previous coronary revascularization. I do not think she was a good candidate for left femoropopliteal bypass because of the inflow disease on the right and also she would require a prosthetic graft. In addition with her obesity and other medical comorbidities she would be at high risk for surgery.  He states his symptoms have progressed slightly. She experiences claudication in both calves associated with ambulation and relieved with rest. She has no significant thigh are hip claudication. She denies rest pain although she does have significant neuropathy. She's had no history of nonhealing wounds.  Past Medical History  Diagnosis Date  . CAD (coronary artery disease)   . HTN (hypertension)   . Diabetes mellitus     onset age 29   insulin dependent  . Hyperlipidemia   . History of heart attack 1988  . CHF (congestive heart failure)   . COPD (chronic obstructive pulmonary disease)   . Peripheral vascular disease   . Myocardial infarction     Family History  Problem Relation Age of Onset  . Diabetes Mother   . Heart disease Mother   . Hypertension Mother   . Heart disease Father   . Heart disease Brother     SOCIAL HISTORY: History  Substance Use Topics  . Smoking status: Former Smoker -- 15 years    Types: Cigarettes    Quit date: 02/18/1988  . Smokeless tobacco: Never Used  .  Alcohol Use: No    Allergies  Allergen Reactions  . Ivp Dye (Iodinated Diagnostic Agents)   . Penicillins   . Valium (Diazepam)     Current Outpatient Prescriptions  Medication Sig Dispense Refill  . amiodarone (PACERONE) 200 MG tablet Take 200 mg by mouth daily.      [name] aspirin 81 MG tablet Take 160 mg by mouth daily.      . benazepril (LOTENSIN) 10 MG tablet Take 10 mg by mouth daily.      . Choline Fenofibrate (TRILIPIX) 135 MG capsule Take 135 mg by mouth daily.      . cilostazol (PLETAL) 50 MG tablet Take 50 mg by mouth 2 (two) times daily.      . clopidogrel (PLAVIX) 75 MG tablet Take 75 mg by mouth daily.      [name] diltiazem (DILACOR XR) 240 MG 24 hr capsule Take 240 mg by mouth daily.      . ferrous sulfate 325 (65 FE) MG tablet Take 325 mg by mouth daily with breakfast.      . furosemide (LASIX) 40 MG tablet Take 40 mg by mouth daily.      [name] gabapentin (NEURONTIN) 400 MG capsule Take 400 mg by mouth 3 (three) times daily.      . insulin NPH (HUMULIN N,NOVOLIN N) 100 UNIT/ML injection Inject into the skin.      [name] isosorbide mononitrate (IMDUR) 60 MG 24 hr  tablet Take 60 mg by mouth daily.      [name] levothyroxine (SYNTHROID, LEVOTHROID) 75 MCG tablet Take 75 mcg by mouth daily.      . metFORMIN (GLUCOPHAGE) 500 MG tablet Take 500 mg by mouth 2 (two) times daily with a meal.      . METOPROLOL TARTRATE PO Take 75 mg by mouth 2 (two) times daily.      . potassium chloride (K-DUR) 10 MEQ tablet Take 10 mEq by mouth 2 (two) times daily.      . pravastatin (PRAVACHOL) 40 MG tablet Take 40 mg by mouth daily.      [name] aspirin 325 MG tablet Take 325 mg by mouth daily.      . ranolazine (RANEXA) 500 MG 12 hr tablet Take 500 mg by mouth 2 (two) times daily.        REVIEW OF SYSTEMS: Arly.Keller ] denotes positive finding; [  ] denotes negative finding  CARDIOVASCULAR:  [ ]  chest pain   [ ]  chest pressure   [ ]  palpitations   [ ]  orthopnea   Arly.Keller ] dyspnea on exertion   Arly.Keller ] claudication   [ ]  rest pain    [ ]  DVT   [ ]  phlebitis PULMONARY:   Arly.Keller ] productive cough   [ ]  asthma   [ ]  wheezing NEUROLOGIC:   [ ]  weakness  [ ]  paresthesias  [ ]  aphasia  [ ]  amaurosis  Arly.Keller ] dizziness HEMATOLOGIC:   [ ]  bleeding problems   [ ]  clotting disorders MUSCULOSKELETAL:  [ ]  joint pain   [ ]  joint swelling Arly.Keller ] leg swelling GASTROINTESTINAL: [ ]   blood in stool  [ ]   hematemesis GENITOURINARY:  [ ]   dysuria  [ ]   hematuria PSYCHIATRIC:  [ ]  history of major depression INTEGUMENTARY:  [ ]  rashes  [ ]  ulcers CONSTITUTIONAL:  [ ]  fever   [ ]  chills  PHYSICAL EXAM: Filed Vitals:   03/19/11 1011  BP: 171/70  Pulse: 85  Resp: 20  Height: 5' 1.5" (1.562 m)  Weight: 138 lb (62.596 kg)   Body mass index is 25.65 kg/(m^2). GENERAL: The patient is a well-nourished female, in no acute distress. The vital signs are documented above. CARDIOVASCULAR: There is a regular rate and rhythm without significant murmur appreciated. I do not detect carotid bruits. She has diminished but palpable femoral pulses. I cannot palpate popliteal or pedal pulses on either side. Both feet appear adequately perfused. She has varicose veins and telangiectasias bilaterally with evidence of chronic venous insufficiency. PULMONARY: There is good air exchange bilaterally without wheezing or rales. ABDOMEN: Soft and non-tender with normal pitched bowel sounds.  MUSCULOSKELETAL: There are no major deformities or cyanosis. NEUROLOGIC: No focal weakness or paresthesias are detected. SKIN: There are no ulcers or rashes noted. She has hyperpigmentation bilaterally. PSYCHIATRIC: The patient has a normal affect.  DATA:  Lab Results  Component Value Date   CREATININE 1.03 11/29/2010   I have independently interpreted her arterial Doppler study today which shows monophasic Doppler signals in both feet with an ABI 54% on the right and 66% on the left.  MEDICAL ISSUES:  Atherosclerosis of native arteries of the extremities with intermittent  claudication This patient has known multilevel arterial occlusive disease. She is followed by Dr. York Ram. She has evidence of inflow disease bilaterally and also infrainguinal arterial occlusive disease. From a surgical standpoint, she's had vein taken from her left leg for previous coronary  revascularization. In addition she is felt to be high risk for surgery. She states that Dr. Allyson [name] had told her that she is not a good candidate for surgery. Her symptoms have progressed slightly but are still tolerable. She does not have limb threatening ischemia. I've encouraged her to stay as active as possible. Fortunately she is not a smoker. If her symptoms progress I've encouraged her to follow up sooner with Dr. Allyson [name] to possibly consider an endovascular approach. I'll see her back in one year. She knows to call sooner if she has problems.    DICKSON,CHRISTOPHER S [facility] [place] Beeper: [phone]

## 2011-07-13 LAB — COMPREHENSIVE METABOLIC PANEL
Alkaline Phosphatase: 51 U/L (ref 50–136)
Anion Gap: 11 (ref 7–16)
Chloride: 102 mmol/L (ref 98–107)
Co2: 28 mmol/L (ref 21–32)
Creatinine: 1.39 mg/dL — ABNORMAL HIGH (ref 0.60–1.30)
EGFR (African American): 42 — ABNORMAL LOW
EGFR (Non-African Amer.): 36 — ABNORMAL LOW
Glucose: 219 mg/dL — ABNORMAL HIGH (ref 65–99)
Potassium: 3.4 mmol/L — ABNORMAL LOW (ref 3.5–5.1)
SGOT(AST): 53 U/L — ABNORMAL HIGH (ref 15–37)
SGPT (ALT): 26 U/L
Total Protein: 7.4 g/dL (ref 6.4–8.2)

## 2011-07-13 LAB — CBC
MCH: 32.2 pg (ref 26.0–34.0)
MCHC: 33.4 g/dL (ref 32.0–36.0)
RBC: 3.97 10*6/uL (ref 3.80–5.20)
RDW: 16.3 % — ABNORMAL HIGH (ref 11.5–14.5)
WBC: 7.8 10*3/uL (ref 3.6–11.0)

## 2011-07-13 LAB — CK TOTAL AND CKMB (NOT AT ARMC): CK-MB: 1.9 ng/mL (ref 0.5–3.6)

## 2011-07-14 LAB — BASIC METABOLIC PANEL
BUN: 18 mg/dL (ref 7–18)
Calcium, Total: 8.3 mg/dL — ABNORMAL LOW (ref 8.5–10.1)
Co2: 27 mmol/L (ref 21–32)
Creatinine: 1.29 mg/dL (ref 0.60–1.30)
EGFR (Non-African Amer.): 40 — ABNORMAL LOW
Osmolality: 288 (ref 275–301)
Sodium: 140 mmol/L (ref 136–145)

## 2011-07-14 LAB — HEMOGLOBIN A1C: Hemoglobin A1C: 8.3 % — ABNORMAL HIGH (ref 4.2–6.3)

## 2011-07-14 LAB — URINALYSIS, COMPLETE
Bilirubin,UR: NEGATIVE
Ketone: NEGATIVE
Nitrite: POSITIVE
RBC,UR: 1 /HPF (ref 0–5)
Specific Gravity: 1.009 (ref 1.003–1.030)
Squamous Epithelial: 1

## 2011-07-14 LAB — LIPID PANEL
HDL Cholesterol: 19 mg/dL — ABNORMAL LOW (ref 40–60)
Ldl Cholesterol, Calc: 106 mg/dL — ABNORMAL HIGH (ref 0–100)

## 2011-07-14 LAB — TROPONIN I: Troponin-I: 0.02 ng/mL

## 2011-07-15 LAB — CLOSTRIDIUM DIFFICILE BY PCR

## 2011-07-16 ENCOUNTER — Inpatient Hospital Stay: Payer: Self-pay | Admitting: Student

## 2011-07-18 LAB — CREATININE, SERUM
Creatinine: 1.33 mg/dL — ABNORMAL HIGH (ref 0.60–1.30)
EGFR (African American): 45 — ABNORMAL LOW
EGFR (Non-African Amer.): 38 — ABNORMAL LOW

## 2011-07-23 ENCOUNTER — Telehealth: Payer: Self-pay | Admitting: Internal Medicine

## 2011-07-23 NOTE — Telephone Encounter (Signed)
Pt called wanting to make new pt appointment she was discharged from hospital 07/18/11 [facility].  She wanted to make a new patient appointment and hospital follow up within 2 weeks.  She has primary care dr.  Dr Eldridge Scot in [place]. Please let me know if i can put her within the 2 week she wants

## 2011-07-24 NOTE — Telephone Encounter (Signed)
Called and advised patient that we will not be able to get her in within the 2 weeks that she is asking for. I advised her to see her PCP until she is established with [place]. I transferred the call the call up front to schedule the first new patient apt we have available.

## 2011-09-01 ENCOUNTER — Emergency Department: Payer: Self-pay | Admitting: Emergency Medicine

## 2011-09-01 LAB — COMPREHENSIVE METABOLIC PANEL WITH GFR
Albumin: 3.9 g/dL
Alkaline Phosphatase: 50 U/L
Anion Gap: 7
BUN: 24 mg/dL — ABNORMAL HIGH
Bilirubin,Total: 0.4 mg/dL
Calcium, Total: 9.2 mg/dL
Chloride: 108 mmol/L — ABNORMAL HIGH
Co2: 27 mmol/L
Creatinine: 1.25 mg/dL
EGFR (African American): 48 — ABNORMAL LOW
EGFR (Non-African Amer.): 41 — ABNORMAL LOW
Glucose: 146 mg/dL — ABNORMAL HIGH
Osmolality: 290
Potassium: 4.4 mmol/L
SGOT(AST): 74 U/L — ABNORMAL HIGH
SGPT (ALT): 35 U/L
Sodium: 142 mmol/L
Total Protein: 7.6 g/dL

## 2011-09-01 LAB — URINALYSIS, COMPLETE
Bilirubin,UR: NEGATIVE
Blood: NEGATIVE
Glucose,UR: NEGATIVE mg/dL
Nitrite: POSITIVE
Ph: 5
Protein: 30
RBC,UR: 4 /HPF
Specific Gravity: 1.018
Squamous Epithelial: 6
WBC UR: 297 /HPF

## 2011-09-01 LAB — CBC
MCHC: 33.7 g/dL (ref 32.0–36.0)
MCV: 100 fL (ref 80–100)
Platelet: 280 10*3/uL (ref 150–440)
RDW: 15.1 % — ABNORMAL HIGH (ref 11.5–14.5)
WBC: 7.6 10*3/uL (ref 3.6–11.0)

## 2011-09-12 ENCOUNTER — Ambulatory Visit: Payer: Medicare Other | Admitting: Internal Medicine

## 2011-11-17 ENCOUNTER — Emergency Department: Payer: Self-pay

## 2011-11-17 LAB — URINALYSIS, COMPLETE
Glucose,UR: 150 mg/dL (ref 0–75)
Nitrite: POSITIVE
Protein: >=500
RBC,UR: 3 /HPF (ref 0–5)
WBC UR: 8 /HPF (ref 0–5)

## 2011-11-17 LAB — BASIC METABOLIC PANEL
Calcium, Total: 9.5 mg/dL (ref 8.5–10.1)
Chloride: 103 mmol/L (ref 98–107)
Co2: 28 mmol/L (ref 21–32)
Creatinine: 1.15 mg/dL (ref 0.60–1.30)
EGFR (Non-African Amer.): 46 — ABNORMAL LOW
Glucose: 120 mg/dL — ABNORMAL HIGH (ref 65–99)
Potassium: 3.7 mmol/L (ref 3.5–5.1)
Sodium: 142 mmol/L (ref 136–145)

## 2011-11-17 LAB — CBC
HCT: 39.7 % (ref 35.0–47.0)
HGB: 13.9 g/dL (ref 12.0–16.0)
MCH: 33.4 pg (ref 26.0–34.0)
MCHC: 35.2 g/dL (ref 32.0–36.0)
MCV: 95 fL (ref 80–100)
RDW: 13.8 % (ref 11.5–14.5)

## 2011-11-18 LAB — URINE CULTURE

## 2011-12-14 ENCOUNTER — Emergency Department: Payer: Self-pay | Admitting: Emergency Medicine

## 2011-12-14 LAB — COMPREHENSIVE METABOLIC PANEL
Albumin: 3.7 g/dL (ref 3.4–5.0)
Alkaline Phosphatase: 82 U/L (ref 50–136)
BUN: 21 mg/dL — ABNORMAL HIGH (ref 7–18)
Bilirubin,Total: 0.3 mg/dL (ref 0.2–1.0)
Chloride: 98 mmol/L (ref 98–107)
Creatinine: 1.63 mg/dL — ABNORMAL HIGH (ref 0.60–1.30)
Osmolality: 289 (ref 275–301)
SGPT (ALT): 28 U/L (ref 12–78)
Sodium: 138 mmol/L (ref 136–145)
Total Protein: 7.4 g/dL (ref 6.4–8.2)

## 2011-12-14 LAB — CK TOTAL AND CKMB (NOT AT ARMC)
CK, Total: 80 U/L (ref 21–215)
CK-MB: 3.5 ng/mL (ref 0.5–3.6)

## 2011-12-14 LAB — CBC
MCHC: 34.3 g/dL (ref 32.0–36.0)
Platelet: 250 10*3/uL (ref 150–440)
RDW: 14.3 % (ref 11.5–14.5)
WBC: 6.7 10*3/uL (ref 3.6–11.0)

## 2012-03-12 ENCOUNTER — Other Ambulatory Visit: Payer: Self-pay | Admitting: *Deleted

## 2012-03-22 ENCOUNTER — Observation Stay: Payer: Self-pay | Admitting: Internal Medicine

## 2012-03-22 LAB — CBC
HCT: 38.6 % (ref 35.0–47.0)
MCHC: 33.6 g/dL (ref 32.0–36.0)
Platelet: 231 10*3/uL (ref 150–440)
RBC: 4.2 10*6/uL (ref 3.80–5.20)
RDW: 14.7 % — ABNORMAL HIGH (ref 11.5–14.5)
WBC: 7.1 10*3/uL (ref 3.6–11.0)

## 2012-03-22 LAB — CK TOTAL AND CKMB (NOT AT ARMC)
CK, Total: 44 U/L (ref 21–215)
CK, Total: 42 U/L (ref 21–215)
CK-MB: 1.2 ng/mL (ref 0.5–3.6)

## 2012-03-22 LAB — COMPREHENSIVE METABOLIC PANEL
Albumin: 3.4 g/dL (ref 3.4–5.0)
Alkaline Phosphatase: 88 U/L (ref 50–136)
Anion Gap: 10 (ref 7–16)
BUN: 11 mg/dL (ref 7–18)
Chloride: 100 mmol/L (ref 98–107)
Co2: 29 mmol/L (ref 21–32)
Creatinine: 0.81 mg/dL (ref 0.60–1.30)
Glucose: 235 mg/dL — ABNORMAL HIGH (ref 65–99)
Osmolality: 285 (ref 275–301)
SGOT(AST): 20 U/L (ref 15–37)
SGPT (ALT): 14 U/L (ref 12–78)
Total Protein: 6.6 g/dL (ref 6.4–8.2)

## 2012-03-22 LAB — TROPONIN I
Troponin-I: <0.02 ng/mL
Troponin-I: 0.03 ng/mL

## 2012-03-23 ENCOUNTER — Encounter: Payer: Self-pay | Admitting: Neurosurgery

## 2012-03-23 DIAGNOSIS — R079 Chest pain, unspecified: Secondary | ICD-10-CM

## 2012-03-23 DIAGNOSIS — I059 Rheumatic mitral valve disease, unspecified: Secondary | ICD-10-CM

## 2012-03-23 LAB — CBC WITH DIFFERENTIAL/PLATELET
Basophil #: 0 10*3/uL (ref 0.0–0.1)
HCT: 35 % (ref 35.0–47.0)
HGB: 11.7 g/dL — ABNORMAL LOW (ref 12.0–16.0)
Lymphocyte %: 18.2 %
Monocyte #: 0.9 x10 3/mm (ref 0.2–0.9)
Monocyte %: 12.2 %
Neutrophil #: 5.1 10*3/uL (ref 1.4–6.5)
Platelet: 213 10*3/uL (ref 150–440)
RBC: 3.81 10*6/uL (ref 3.80–5.20)
RDW: 14.4 % (ref 11.5–14.5)
WBC: 7.6 10*3/uL (ref 3.6–11.0)

## 2012-03-23 LAB — BASIC METABOLIC PANEL
BUN: 15 mg/dL (ref 7–18)
Calcium, Total: 8.8 mg/dL (ref 8.5–10.1)
EGFR (African American): 50 — ABNORMAL LOW
EGFR (Non-African Amer.): 43 — ABNORMAL LOW
Glucose: 206 mg/dL — ABNORMAL HIGH (ref 65–99)
Osmolality: 282 (ref 275–301)
Potassium: 3.9 mmol/L (ref 3.5–5.1)
Sodium: 138 mmol/L (ref 136–145)

## 2012-03-23 LAB — TROPONIN I: Troponin-I: 0.02 ng/mL

## 2012-03-23 LAB — CK TOTAL AND CKMB (NOT AT ARMC): CK, Total: 34 U/L (ref 21–215)

## 2012-03-24 ENCOUNTER — Telehealth: Payer: Self-pay

## 2012-03-24 ENCOUNTER — Ambulatory Visit: Payer: Self-pay | Admitting: Neurosurgery

## 2012-03-24 DIAGNOSIS — R079 Chest pain, unspecified: Secondary | ICD-10-CM

## 2012-03-24 DIAGNOSIS — I200 Unstable angina: Secondary | ICD-10-CM

## 2012-03-24 NOTE — Telephone Encounter (Signed)
Pt d/c today. Will attempt TCM #1 2/6

## 2012-03-24 NOTE — Telephone Encounter (Signed)
Message copied by [facility], Mackenzye Mackel E on Wed Mar 24, 2012  2:38 PM ------      Message from: Thersa Salt      Created: Wed Mar 24, 2012  2:34 PM      Regarding: tcm       appt 2/14 with Kirke Corin

## 2012-03-24 NOTE — Telephone Encounter (Signed)
tcm

## 2012-03-25 NOTE — Telephone Encounter (Signed)
TCM attempt #1 LMTCB

## 2012-03-26 NOTE — Telephone Encounter (Signed)
lmtcb tcm attempt #2

## 2012-03-29 ENCOUNTER — Encounter: Payer: Self-pay | Admitting: *Deleted

## 2012-04-02 ENCOUNTER — Encounter: Payer: Medicare Other | Admitting: Cardiovascular Disease

## 2012-04-05 ENCOUNTER — Encounter: Payer: Self-pay | Admitting: Cardiovascular Disease

## 2012-04-05 ENCOUNTER — Ambulatory Visit (INDEPENDENT_AMBULATORY_CARE_PROVIDER_SITE_OTHER): Payer: Medicare Other | Admitting: Cardiovascular Disease

## 2012-04-05 VITALS — BP 168/62 | HR 82 | Ht 61.0 in | Wt 139.0 lb

## 2012-04-05 DIAGNOSIS — I480 Paroxysmal atrial fibrillation: Secondary | ICD-10-CM

## 2012-04-05 DIAGNOSIS — R079 Chest pain, unspecified: Secondary | ICD-10-CM

## 2012-04-05 DIAGNOSIS — I4891 Unspecified atrial fibrillation: Secondary | ICD-10-CM

## 2012-04-05 DIAGNOSIS — I2510 Atherosclerotic heart disease of native coronary artery without angina pectoris: Secondary | ICD-10-CM

## 2012-04-05 DIAGNOSIS — I739 Peripheral vascular disease, unspecified: Secondary | ICD-10-CM

## 2012-04-05 DIAGNOSIS — I10 Essential (primary) hypertension: Secondary | ICD-10-CM

## 2012-04-05 DIAGNOSIS — R0602 Shortness of breath: Secondary | ICD-10-CM

## 2012-04-05 MED ORDER — BENAZEPRIL HCL 20 MG PO TABS: 20.0000 mg | ORAL_TABLET | Freq: Every day | ORAL | Status: DC

## 2012-04-05 NOTE — Patient Instructions (Addendum)
Increase Benazepril to 20 mg once daily.  Follow up in 6 months.

## 2012-04-06 ENCOUNTER — Encounter: Payer: Self-pay | Admitting: Cardiovascular Disease

## 2012-04-06 DIAGNOSIS — I739 Peripheral vascular disease, unspecified: Secondary | ICD-10-CM | POA: Insufficient documentation

## 2012-04-06 DIAGNOSIS — I480 Paroxysmal atrial fibrillation: Secondary | ICD-10-CM | POA: Insufficient documentation

## 2012-04-06 DIAGNOSIS — I10 Essential (primary) hypertension: Secondary | ICD-10-CM | POA: Insufficient documentation

## 2012-04-06 DIAGNOSIS — I2510 Atherosclerotic heart disease of native coronary artery without angina pectoris: Secondary | ICD-10-CM | POA: Insufficient documentation

## 2012-04-06 NOTE — Assessment & Plan Note (Signed)
She has stable claudication.

## 2012-04-06 NOTE — Assessment & Plan Note (Signed)
She is maintaining in sinus rhythm with amiodarone. She is not on anticoagulation likely because she is on both aspirin and Plavix.

## 2012-04-06 NOTE — Progress Notes (Signed)
HPI  This is a 79 year old female who is here today to establish cardiovascular care. She has extensive cardiac history and use to followup with Dr. Allyson [name] in [place] but has not been able to go there due to transportation issues. She has known history of coronary artery disease status post CABG in 1988. She had multiple PCI done on the SVGs. Most recent cardiac catheterization in 2012 showed occluded native vessels with patent LIMA to LAD and SVG to OM. There was no patent graft to the RCA which was filling via left-to-right collaterals. There was diffuse disease in the distal LAD beyond the anastomosis. Ejection fraction was 45%. She has suffered from chronic angina. She also has extensive peripheral arterial disease which is being managed by Dr. [name]. She was hospitalized briefly at [facility] recently due to chest pain. She ruled out for myocardial infarction. She was initially hypertensive and slightly tachycardic on presentation. She was off aspirin and Plavix for 5 days for a hip injection . she underwent a pharmacologic nuclear stress test which showed no evidence of ischemia. Ejection fraction was 40%. There was evidence of prior inferior and lateral wall infarct. Overall, she is feeling better. Her chest improved. She has chronic exertional dyspnea. Recent echocardiogram showed no significant valvular abnormalities.  Allergies  Allergen Reactions  . Ivp Dye (Iodinated Diagnostic Agents)   . Penicillins   . Valium (Diazepam)      Current Outpatient Prescriptions on File Prior to Visit  Medication Sig Dispense Refill  . amiodarone (PACERONE) 200 MG tablet Take 200 mg by mouth daily.      [name] aspirin 81 MG tablet Take 81 mg by mouth daily.       . Choline Fenofibrate (TRILIPIX) 135 MG capsule Take 135 mg by mouth daily.      . cilostazol (PLETAL) 50 MG tablet Take 50 mg by mouth 2 (two) times daily.      . clopidogrel (PLAVIX) 75 MG tablet Take 75 mg by mouth daily.      . ferrous sulfate  325 (65 FE) MG tablet Take 325 mg by mouth daily with breakfast.      . furosemide (LASIX) 40 MG tablet Take 40 mg by mouth daily.      [name] gabapentin (NEURONTIN) 400 MG capsule Take 400 mg by mouth 3 (three) times daily.      . isosorbide mononitrate (IMDUR) 60 MG 24 hr tablet Take 60 mg by mouth daily.      [name] levothyroxine (SYNTHROID, LEVOTHROID) 75 MCG tablet Take 75 mcg by mouth daily.      . metFORMIN (GLUCOPHAGE) 500 MG tablet Take 500 mg by mouth 2 (two) times daily with a meal.      . METOPROLOL TARTRATE PO Take 75 mg by mouth 2 (two) times daily.      . potassium chloride (K-DUR) 10 MEQ tablet Take 10 mEq by mouth 2 (two) times daily.      . pravastatin (PRAVACHOL) 40 MG tablet Take 40 mg by mouth daily.      . ranolazine (RANEXA) 500 MG 12 hr tablet Take 500 mg by mouth 2 (two) times daily.       No current facility-administered medications on file prior to visit.     Past Medical History  Diagnosis Date  . HTN (hypertension)   . Diabetes mellitus     onset age 5   insulin dependent  . Hyperlipidemia   . History of heart attack 1988  . COPD (  chronic obstructive pulmonary disease)   . Peripheral vascular disease   . Myocardial infarction   . CAD (coronary artery disease)     CABG in 1988, multiple PCI on SVGs. Most recent cath was in 2012: occluded native coronary arteries, patent LIMA to LAD (distal LAD disease), patent SVG to OM and occluded SVT to RCA which filled via left to right collaterals.   . CHF (congestive heart failure)     EF 40-45%  . Paroxysmal atrial fibrillation      Past Surgical History  Procedure Laterality Date  . Abdominal hysterectomy    . Coronary artery bypass graft    . Appendectomy    . Cholecystectomy    . Ovary surgery    . Angioplasty / stenting femoral      Bilateral SFA stenting     Family History  Problem Relation Age of Onset  . Diabetes Mother   . Heart disease Mother   . Hypertension Mother   . Heart disease Father   .  Heart disease Brother      History   Social History  . Marital Status: Married    Spouse Name: N/A    Number of Children: N/A  . Years of Education: N/A   Occupational History  . Not on file.   Social History Main Topics  . Smoking status: Former Smoker -- 15 years    Types: Cigarettes    Quit date: 02/18/1988  . Smokeless tobacco: Never Used  . Alcohol Use: No  . Drug Use: No  . Sexually Active: Not on file   Other Topics Concern  . Not on file   Social History Narrative  . No narrative on file     PHYSICAL EXAM   BP 168/62  Pulse 82  Ht 5\' 1"  (1.549 m)  Wt 139 lb (63.05 kg)  BMI 26.28 kg/m2 Constitutional: She is oriented to person, place, and time. She appears well-developed and well-nourished. No distress.  HENT: No nasal discharge.  Head: Normocephalic and atraumatic.  Eyes: Pupils are equal and round. Right eye exhibits no discharge. Left eye exhibits no discharge.  Neck: Normal range of motion. Neck supple. No JVD present. No thyromegaly present.  Cardiovascular: Normal rate, regular rhythm, normal heart sounds. Exam reveals no gallop and no friction rub. No murmur heard.  Pulmonary/Chest: Effort normal and breath sounds normal. No stridor. No respiratory distress. She has no wheezes. She has no rales. She exhibits no tenderness.  Abdominal: Soft. Bowel sounds are normal. She exhibits no distension. There is no tenderness. There is no rebound and no guarding.  Musculoskeletal: Normal range of motion. She exhibits no edema and no tenderness.  Neurological: She is alert and oriented to person, place, and time. Coordination normal.  Skin: Skin is warm and dry. No rash noted. She is not diaphoretic. No erythema. No pallor.  Psychiatric: She has a normal mood and affect. Her behavior is normal. Judgment and thought content normal.     EKG: Sinus  Rhythm  -Frequent pvcs -ventricular trigeminy  -Right bundle branch block.   ABNORMAL    ASSESSMENT AND  PLAN

## 2012-04-06 NOTE — Assessment & Plan Note (Signed)
Her blood pressure is elevated. I will increase benazepril to 20 mg daily.

## 2012-04-06 NOTE — Assessment & Plan Note (Addendum)
Recent stress test showed no significant ischemia. The patient has no chronic angina with occluded native vessels and vein graft to the RCA with left-to-right collaterals. Continue medical therapy. She is on good medications for coronary artery disease but will need to optimize her blood pressure. She wants to have another injection. There is no contraindication from a cardiac standpoint. Plavix can be held 5 days before surgery. She should remain on aspirin 81 mg daily.

## 2012-04-09 ENCOUNTER — Telehealth: Payer: Self-pay | Admitting: Cardiovascular Disease

## 2012-04-09 NOTE — Telephone Encounter (Signed)
She should stay on aspirin 81 mg once daily. Last time she stopped both aspirin and Plavix, she ended up in the hospital with chest pain.

## 2012-04-09 NOTE — Telephone Encounter (Signed)
Is this ok?

## 2012-04-09 NOTE — Telephone Encounter (Signed)
Pt needs to know if she can stop aspirin 81mg  per the request of Juanita @ [facility].  Juanita's fax # is [phone] and needs a letter stating it is ok to be off of aspirin.  Pt states she already has a note to stop the Plavix but states the [facility] dept needs one for the aspirin.  She would also like a call back to discuss.

## 2012-04-12 NOTE — Telephone Encounter (Signed)
New letter faxed Pt informed

## 2012-04-12 NOTE — Telephone Encounter (Signed)
[name] called me back Says as long as pt can hold both plavix and pletal, ok to proceed with ASA alone Will clarify if ok to hold plavix and pletal as well

## 2012-04-12 NOTE — Telephone Encounter (Signed)
Ok to hold pletal as well prior to radiological procedure?

## 2012-04-12 NOTE — Telephone Encounter (Signed)
Ok to hold Plavix and Pletal.

## 2012-04-12 NOTE — Telephone Encounter (Signed)
Pt informed Says [name] told pt she cannot have this "procedure" if she remains on ASA I had pt hold while I tried to call [place].  I had to [facility] to have her call me back I told pt i am awaiting a return call from [place] to get more info re: the procedure, etc and I will call her back Understanding verb

## 2012-04-12 NOTE — Telephone Encounter (Signed)
LMTCB Faxing letter to [place] re: ASA

## 2012-04-27 ENCOUNTER — Ambulatory Visit: Payer: Self-pay | Admitting: Specialist

## 2012-04-27 LAB — PLATELET COUNT: Platelet: 272 10*3/uL (ref 150–440)

## 2012-04-27 LAB — APTT: Activated PTT: 31.4 secs (ref 23.6–35.9)

## 2012-04-27 LAB — PROTIME-INR: Prothrombin Time: 13.3 secs (ref 11.5–14.7)

## 2012-04-30 ENCOUNTER — Telehealth: Payer: Self-pay

## 2012-04-30 NOTE — Telephone Encounter (Signed)
LMOM re:EF=40%

## 2012-04-30 NOTE — Telephone Encounter (Signed)
Nurse with [facility] needs pt last ejection fraction

## 2012-04-30 NOTE — Telephone Encounter (Signed)
lmtcb

## 2012-10-17 IMAGING — NM NM PULMONARY VENT & PERF
2 series · 12 of 12 positions shown · non-contrast
Comparison: Chest radiograph obtained today

CLINICAL DATA: Short of breath.  Elevated D-dimer.  Clinical
suspicion for pulmonary embolism.

NUCLEAR MEDICINE VENTILATION - PERFUSION LUNG SCAN
TECHNIQUE: Wash-in, equilibrium, and wash-out phase ventilation
images were obtained using Te-M77 gas.  Perfusion images were
obtained in multiple projections after intravenous injection of Tc-
99m MAA.
Radiopharmaceuticals:  11 mCi Te-M77 gas and 6 mCi 2c-00m MAA.

[vq scan · 2.52mm/px · 6 of 20 frames shown (1 of 2)]
[frame 2/20  full-range]
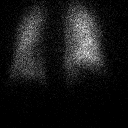
[frame 5/20  full-range]
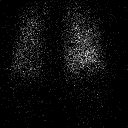
[frame 9/20]
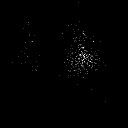
[frame 12/20]
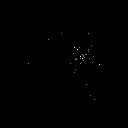
[frame 15/20]
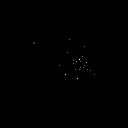
[frame 19/20]
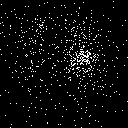

[vq scan · 2.52mm/px · 6 of 20 frames shown (2 of 2)]
[frame 2/20  full-range]
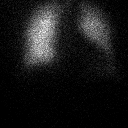
[frame 5/20  full-range]
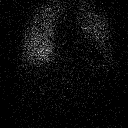
[frame 9/20  full-range]
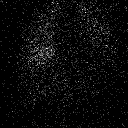
[frame 12/20]
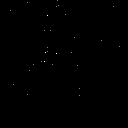
[frame 15/20]
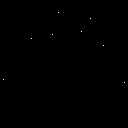
[frame 19/20]
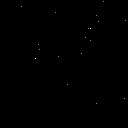

[12 of 12 positions shown; findings below may reference images not displayed]

FINDINGS: Ventilation imaging shows uniform distribution of xenon
activity throughout both lungs on wash-in and equilibrium phases.
Washout phase imaging shows mild xenon retention in the right lung
base.

Perfusion imaging shows a small unmatched segmental perfusion
defect in the posterior left upper lobe.  No other segmental
pulmonary perfusion defects are identified.
IMPRESSION: Findings consistent with very low probability for pulmonary
embolism by PIOPED II criteria.

## 2013-02-09 ENCOUNTER — Other Ambulatory Visit: Payer: Self-pay | Admitting: Cardiovascular Disease

## 2013-03-20 DEATH — deceased

## 2013-04-08 ENCOUNTER — Inpatient Hospital Stay: Payer: Self-pay | Admitting: Internal Medicine

## 2013-04-08 DIAGNOSIS — I509 Heart failure, unspecified: Secondary | ICD-10-CM | POA: Insufficient documentation

## 2013-04-08 LAB — PROTIME-INR
INR: 1.1
Prothrombin Time: 13.7 secs (ref 11.5–14.7)

## 2013-04-08 LAB — URINALYSIS, COMPLETE
Bilirubin,UR: NEGATIVE
Glucose,UR: >=500 mg/dL (ref 0–75)
KETONE: NEGATIVE
Leukocyte Esterase: NEGATIVE
Nitrite: POSITIVE
Ph: 6 (ref 4.5–8.0)
Protein: 100
RBC,UR: 5 /HPF (ref 0–5)
Specific Gravity: 1.009 (ref 1.003–1.030)
Squamous Epithelial: 1
WBC UR: 5 /HPF (ref 0–5)

## 2013-04-08 LAB — CBC
HCT: 36 % (ref 35.0–47.0)
HGB: 12.3 g/dL (ref 12.0–16.0)
MCH: 33.6 pg (ref 26.0–34.0)
MCHC: 34.1 g/dL (ref 32.0–36.0)
MCV: 99 fL (ref 80–100)
PLATELETS: 247 10*3/uL (ref 150–440)
RBC: 3.66 10*6/uL — ABNORMAL LOW (ref 3.80–5.20)
RDW: 14.6 % — ABNORMAL HIGH (ref 11.5–14.5)
WBC: 11 10*3/uL (ref 3.6–11.0)

## 2013-04-08 LAB — COMPREHENSIVE METABOLIC PANEL
ALK PHOS: 99 U/L
ALT: 22 U/L (ref 12–78)
ANION GAP: 3 — AB (ref 7–16)
AST: 27 U/L (ref 15–37)
Albumin: 3.3 g/dL — ABNORMAL LOW (ref 3.4–5.0)
BUN: 21 mg/dL — ABNORMAL HIGH (ref 7–18)
Bilirubin,Total: 0.4 mg/dL (ref 0.2–1.0)
CALCIUM: 8.7 mg/dL (ref 8.5–10.1)
CO2: 31 mmol/L (ref 21–32)
Chloride: 104 mmol/L (ref 98–107)
Creatinine: 1.3 mg/dL (ref 0.60–1.30)
EGFR (African American): 45 — ABNORMAL LOW
GFR CALC NON AF AMER: 39 — AB
GLUCOSE: 295 mg/dL — AB (ref 65–99)
OSMOLALITY: 290 (ref 275–301)
Potassium: 4.5 mmol/L (ref 3.5–5.1)
Sodium: 138 mmol/L (ref 136–145)
Total Protein: 7 g/dL (ref 6.4–8.2)

## 2013-04-08 LAB — CK TOTAL AND CKMB (NOT AT ARMC)
CK, TOTAL: 65 U/L
CK-MB: 2.1 ng/mL (ref 0.5–3.6)

## 2013-04-08 LAB — CK-MB
CK-MB: 1.9 ng/mL (ref 0.5–3.6)
CK-MB: 1.7 ng/mL (ref 0.5–3.6)

## 2013-04-08 LAB — APTT: Activated PTT: 25.5 secs (ref 23.6–35.9)

## 2013-04-08 LAB — TROPONIN I: Troponin-I: <0.02 ng/mL

## 2013-04-08 LAB — PRO B NATRIURETIC PEPTIDE: B-TYPE NATIURETIC PEPTID: 2399 pg/mL — AB (ref 0–450)

## 2013-04-09 DIAGNOSIS — I2510 Atherosclerotic heart disease of native coronary artery without angina pectoris: Secondary | ICD-10-CM

## 2013-04-09 DIAGNOSIS — I4891 Unspecified atrial fibrillation: Secondary | ICD-10-CM

## 2013-04-09 DIAGNOSIS — I739 Peripheral vascular disease, unspecified: Secondary | ICD-10-CM

## 2013-04-09 LAB — BASIC METABOLIC PANEL
Anion Gap: 7 (ref 7–16)
BUN: 26 mg/dL — AB (ref 7–18)
CHLORIDE: 103 mmol/L (ref 98–107)
CO2: 29 mmol/L (ref 21–32)
Calcium, Total: 8.3 mg/dL — ABNORMAL LOW (ref 8.5–10.1)
Creatinine: 1.54 mg/dL — ABNORMAL HIGH (ref 0.60–1.30)
EGFR (African American): 37 — ABNORMAL LOW
EGFR (Non-African Amer.): 32 — ABNORMAL LOW
GLUCOSE: 131 mg/dL — AB (ref 65–99)
Osmolality: 284 (ref 275–301)
POTASSIUM: 3.7 mmol/L (ref 3.5–5.1)
Sodium: 139 mmol/L (ref 136–145)

## 2013-04-09 LAB — TSH: THYROID STIMULATING HORM: 2.15 u[IU]/mL

## 2013-04-10 LAB — BASIC METABOLIC PANEL
ANION GAP: 4 — AB (ref 7–16)
BUN: 25 mg/dL — ABNORMAL HIGH (ref 7–18)
Calcium, Total: 9.1 mg/dL (ref 8.5–10.1)
Chloride: 99 mmol/L (ref 98–107)
Co2: 33 mmol/L — ABNORMAL HIGH (ref 21–32)
Creatinine: 1.45 mg/dL — ABNORMAL HIGH (ref 0.60–1.30)
EGFR (African American): 40 — ABNORMAL LOW
GFR CALC NON AF AMER: 34 — AB
Glucose: 97 mg/dL (ref 65–99)
Osmolality: 276 (ref 275–301)
POTASSIUM: 3.7 mmol/L (ref 3.5–5.1)
Sodium: 136 mmol/L (ref 136–145)

## 2013-04-11 LAB — BASIC METABOLIC PANEL
Anion Gap: 3 — ABNORMAL LOW (ref 7–16)
BUN: 27 mg/dL — ABNORMAL HIGH (ref 7–18)
CO2: 33 mmol/L — AB (ref 21–32)
Calcium, Total: 8.9 mg/dL (ref 8.5–10.1)
Chloride: 99 mmol/L (ref 98–107)
Creatinine: 1.25 mg/dL (ref 0.60–1.30)
EGFR (African American): 47 — ABNORMAL LOW
GFR CALC NON AF AMER: 41 — AB
GLUCOSE: 140 mg/dL — AB (ref 65–99)
Osmolality: 278 (ref 275–301)
Potassium: 4 mmol/L (ref 3.5–5.1)
Sodium: 135 mmol/L — ABNORMAL LOW (ref 136–145)

## 2013-04-12 LAB — BASIC METABOLIC PANEL
ANION GAP: 6 — AB (ref 7–16)
BUN: 33 mg/dL — ABNORMAL HIGH (ref 7–18)
CO2: 32 mmol/L (ref 21–32)
Calcium, Total: 8.8 mg/dL (ref 8.5–10.1)
Chloride: 94 mmol/L — ABNORMAL LOW (ref 98–107)
Creatinine: 1.66 mg/dL — ABNORMAL HIGH (ref 0.60–1.30)
EGFR (African American): 34 — ABNORMAL LOW
EGFR (Non-African Amer.): 29 — ABNORMAL LOW
Glucose: 302 mg/dL — ABNORMAL HIGH (ref 65–99)
Osmolality: 283 (ref 275–301)
POTASSIUM: 4.5 mmol/L (ref 3.5–5.1)
SODIUM: 132 mmol/L — AB (ref 136–145)

## 2013-04-12 LAB — CBC WITH DIFFERENTIAL/PLATELET
Basophil #: 0 10*3/uL (ref 0.0–0.1)
Basophil %: 0.1 %
Eosinophil #: 0 10*3/uL (ref 0.0–0.7)
Eosinophil %: 0 %
HCT: 33.7 % — ABNORMAL LOW (ref 35.0–47.0)
HGB: 11.6 g/dL — ABNORMAL LOW (ref 12.0–16.0)
Lymphocyte #: 0.3 10*3/uL — ABNORMAL LOW (ref 1.0–3.6)
Lymphocyte %: 5.9 %
MCH: 33.4 pg (ref 26.0–34.0)
MCHC: 34.5 g/dL (ref 32.0–36.0)
MCV: 97 fL (ref 80–100)
Monocyte #: 0.1 x10 3/mm — ABNORMAL LOW (ref 0.2–0.9)
Monocyte %: 1.3 %
NEUTROS PCT: 92.7 %
Neutrophil #: 4.9 10*3/uL (ref 1.4–6.5)
Platelet: 232 10*3/uL (ref 150–440)
RBC: 3.48 10*6/uL — AB (ref 3.80–5.20)
RDW: 14 % (ref 11.5–14.5)
WBC: 5.3 10*3/uL (ref 3.6–11.0)

## 2013-04-13 DIAGNOSIS — I739 Peripheral vascular disease, unspecified: Secondary | ICD-10-CM

## 2013-04-13 DIAGNOSIS — I2510 Atherosclerotic heart disease of native coronary artery without angina pectoris: Secondary | ICD-10-CM

## 2013-04-13 DIAGNOSIS — I4891 Unspecified atrial fibrillation: Secondary | ICD-10-CM

## 2013-04-13 LAB — CBC WITH DIFFERENTIAL/PLATELET
BASOS PCT: 0.1 %
Basophil #: 0 10*3/uL (ref 0.0–0.1)
Eosinophil #: 0 10*3/uL (ref 0.0–0.7)
Eosinophil %: 0 %
HCT: 30.6 % — ABNORMAL LOW (ref 35.0–47.0)
HGB: 10.3 g/dL — AB (ref 12.0–16.0)
Lymphocyte #: 0.8 10*3/uL — ABNORMAL LOW (ref 1.0–3.6)
Lymphocyte %: 7 %
MCH: 32.4 pg (ref 26.0–34.0)
MCHC: 33.8 g/dL (ref 32.0–36.0)
MCV: 96 fL (ref 80–100)
MONO ABS: 0.7 x10 3/mm (ref 0.2–0.9)
MONOS PCT: 5.6 %
Neutrophil #: 10.4 10*3/uL — ABNORMAL HIGH (ref 1.4–6.5)
Neutrophil %: 87.3 %
Platelet: 244 10*3/uL (ref 150–440)
RBC: 3.2 10*6/uL — ABNORMAL LOW (ref 3.80–5.20)
RDW: 14.5 % (ref 11.5–14.5)
WBC: 11.9 10*3/uL — AB (ref 3.6–11.0)

## 2013-04-13 LAB — CREATININE, SERUM
CREATININE: 1.53 mg/dL — AB (ref 0.60–1.30)
EGFR (African American): 37 — ABNORMAL LOW
EGFR (Non-African Amer.): 32 — ABNORMAL LOW

## 2013-04-20 ENCOUNTER — Telehealth: Payer: Self-pay | Admitting: *Deleted

## 2013-04-20 NOTE — Telephone Encounter (Signed)
Patient [facility].  Patient understands to follow up with provider Ar[facility] Patient understands discharge instructions? yes Patient understands medications and regiment? yes Patient understands to bring all medications to this visit? yes

## 2013-04-22 ENCOUNTER — Telehealth: Payer: Self-pay | Admitting: *Deleted

## 2013-04-22 DIAGNOSIS — R609 Edema, unspecified: Secondary | ICD-10-CM

## 2013-04-22 MED ORDER — FUROSEMIDE 40 MG PO TABS: 20.0000 mg | ORAL_TABLET | Freq: Every day | ORAL | Status: DC

## 2013-04-22 NOTE — Telephone Encounter (Signed)
Per Dr. Kirke CorinArida he would like patient to start 20 mg of lasix daily and have a BMP in one week. Patient verbalized understanding.

## 2013-04-22 NOTE — Telephone Encounter (Signed)
Bonita QuinLinda [facility] Her weight is up. Tuesday 135 and today 142. Patient had chest pains during the night and took nitro. Please advise

## 2013-04-22 NOTE — Telephone Encounter (Signed)
Change Lasix to 40 mg bid for 2 days then back to once daily.

## 2013-04-22 NOTE — Telephone Encounter (Signed)
Patient was taken off lasix at recent hospital admission.

## 2013-04-22 NOTE — Telephone Encounter (Signed)
Patient has gained 7 lbs since Tuesday. She denies shortness of breath. She has "some swelling in her legs and ankles". She denies eating any salty food and has been taking her medication regularly. She had chest pain last night and took nitro x 3 q 5 minutes. She said the chest pain was relieved and has not returned.

## 2013-04-29 ENCOUNTER — Ambulatory Visit (INDEPENDENT_AMBULATORY_CARE_PROVIDER_SITE_OTHER): Payer: Medicare HMO | Admitting: *Deleted

## 2013-04-29 ENCOUNTER — Telehealth: Payer: Self-pay | Admitting: *Deleted

## 2013-04-29 DIAGNOSIS — R609 Edema, unspecified: Secondary | ICD-10-CM

## 2013-04-29 MED ORDER — FUROSEMIDE 40 MG PO TABS: 40.0000 mg | ORAL_TABLET | Freq: Every day | ORAL | Status: DC

## 2013-04-29 NOTE — Telephone Encounter (Signed)
She needs to be evaluated in clinic then.

## 2013-04-29 NOTE — Telephone Encounter (Signed)
Home health nurse called and patient is building fluid in her legs, pitting adema to knees, crackles in both lung bases. Please call and advise.

## 2013-04-29 NOTE — Telephone Encounter (Signed)
Patient stated she has been taking 20 mg BID on accident already.

## 2013-04-29 NOTE — Telephone Encounter (Signed)
Home health nurse called and patient is building fluid in her legs, pitting adema to knees, crackles in both lung bases. She stated her abdomen also seems to be retaining fluid. She has been having issues since her lasix was adjusted last. Her labs were done by home health nurse and have been scanned.

## 2013-04-29 NOTE — Telephone Encounter (Signed)
Instructed patient to stay on 40 mg daily and make sure she comes to her appt with Dr. Kirke CorinArida on Tuesday to discuss further.  Instructed patient to go to the ED if she felt her situation became emergent.  Patient verbalized understanding.

## 2013-04-29 NOTE — Telephone Encounter (Signed)
Increase lasix to 40 mg once daily. She is supposed to get a follow up BMP.

## 2013-04-30 LAB — BASIC METABOLIC PANEL
BUN / CREAT RATIO: 14 (ref 11–26)
BUN: 19 mg/dL (ref 8–27)
CALCIUM: 8.9 mg/dL (ref 8.7–10.3)
CHLORIDE: 102 mmol/L (ref 97–108)
CO2: 22 mmol/L (ref 18–29)
Creatinine, Ser: 1.37 mg/dL — ABNORMAL HIGH (ref 0.57–1.00)
GFR calc Af Amer: 42 mL/min/{1.73_m2} — ABNORMAL LOW (ref >59)
GFR calc non Af Amer: 37 mL/min/{1.73_m2} — ABNORMAL LOW (ref >59)
GLUCOSE: 180 mg/dL — AB (ref 65–99)
Potassium: 4.6 mmol/L (ref 3.5–5.2)
Sodium: 143 mmol/L (ref 134–144)

## 2013-05-03 ENCOUNTER — Ambulatory Visit (INDEPENDENT_AMBULATORY_CARE_PROVIDER_SITE_OTHER): Payer: Medicare HMO | Admitting: Cardiovascular Disease

## 2013-05-03 ENCOUNTER — Encounter: Payer: Self-pay | Admitting: Cardiovascular Disease

## 2013-05-03 VITALS — BP 166/69 | HR 79 | Ht 61.5 in | Wt 144.8 lb

## 2013-05-03 DIAGNOSIS — I4891 Unspecified atrial fibrillation: Secondary | ICD-10-CM

## 2013-05-03 DIAGNOSIS — I480 Paroxysmal atrial fibrillation: Secondary | ICD-10-CM

## 2013-05-03 DIAGNOSIS — I739 Peripheral vascular disease, unspecified: Secondary | ICD-10-CM

## 2013-05-03 DIAGNOSIS — I2510 Atherosclerotic heart disease of native coronary artery without angina pectoris: Secondary | ICD-10-CM

## 2013-05-03 DIAGNOSIS — R079 Chest pain, unspecified: Secondary | ICD-10-CM

## 2013-05-03 DIAGNOSIS — I5022 Chronic systolic (congestive) heart failure: Secondary | ICD-10-CM

## 2013-05-03 MED ORDER — FUROSEMIDE 40 MG PO TABS: 40.0000 mg | ORAL_TABLET | Freq: Two times a day (BID) | ORAL | Status: DC

## 2013-05-03 NOTE — Patient Instructions (Signed)
Your physician has recommended you make the following change in your medication:  Increase Lasix to 40 mg twice daily  Your physician recommends that you return for lab work in:  [facility]  Your physician recommends that you schedule a follow-up appointment in:  3 months

## 2013-05-03 NOTE — Assessment & Plan Note (Signed)
She has mild claudication.

## 2013-05-03 NOTE — Assessment & Plan Note (Signed)
The patient appears to be still fluid overloaded with significant weight gain and lower extremity edema. I recommend increasing the dose of Lasix to 40 mg twice daily. Check basic metabolic profile in one week. I will consider switching her stopping diltiazem and increasing carvedilol given no extremity edema and mild ischemic cardiomyopathy.

## 2013-05-03 NOTE — Assessment & Plan Note (Signed)
She is maintaining in sinus rhythm with amiodarone. I have not noted atrial fibrillation since taking over her care. If she develops any documented episodes, I think we should strongly consider anticoagulation in stopping antiplatelet medications.

## 2013-05-03 NOTE — Assessment & Plan Note (Addendum)
She has stable angina with no recent worsening. Continue medical therapy. Plavix was stopped recently given that she is on both aspirin and Pletal.

## 2013-05-03 NOTE — Progress Notes (Signed)
HPI  This is a 80 year old female who is here today for a followup visit after recent hospitalization at [facility].  She has extensive cardiac history which include coronary artery disease status post CABG in 1988. She had multiple PCI done on the SVGs. Most recent cardiac catheterization in 2012 showed occluded native vessels with patent LIMA to LAD and SVG to OM. There was no patent graft to the RCA which was filling via left-to-right collaterals. There was diffuse disease in the distal LAD beyond the anastomosis. Ejection fraction was 45%. She has suffered from chronic angina. She also has extensive peripheral arterial disease which is being managed by Dr. [name].  Most recent nuclear stress test in 2014 showed no evidence of ischemia. Ejection fraction was 40%. There was evidence of prior inferior and lateral wall infarct. Recent echocardiogram showed no significant valvular abnormalities. She was hospitalized at [facility] on February 21 with acute on chronic systolic heart failure. She improved with IV Lasix. Plavix was stopped given that she is on both aspirin and Pletal. She is getting physical therapy at home. There is also a visiting nurse that comes to her house. She was noted to have worsening edema with weight gain. Her weight now is 144 pounds. Dry weight is around 135 pounds.  Allergies  Allergen Reactions  . Ivp Dye [Iodinated Diagnostic Agents]   . Penicillins   . Valium [Diazepam]      Current Outpatient Prescriptions on File Prior to Visit  Medication Sig Dispense Refill  . amiodarone (PACERONE) 200 MG tablet Take 200 mg by mouth daily.      [name] aspirin 81 MG tablet Take 81 mg by mouth daily.       . benazepril (LOTENSIN) 20 MG tablet TAKE 1 TABLET (20 MG TOTAL) BY MOUTH DAILY.  30 tablet  5  . cilostazol (PLETAL) 50 MG tablet Take 50 mg by mouth 2 (two) times daily.      [name] gabapentin (NEURONTIN) 400 MG capsule Take 400 mg by mouth 3 (three) times daily.      . potassium chloride  (K-DUR) 10 MEQ tablet Take 10 mEq by mouth daily.       . pravastatin (PRAVACHOL) 40 MG tablet Take 40 mg by mouth daily.      . ranolazine (RANEXA) 500 MG 12 hr tablet Take 500 mg by mouth daily.        No current facility-administered medications on file prior to visit.     Past Medical History  Diagnosis Date  . Diabetes mellitus     onset age 74   insulin dependent  . Hyperlipidemia   . History of heart attack 1988  . COPD (chronic obstructive pulmonary disease)   . Peripheral vascular disease   . Myocardial infarction   . CAD (coronary artery disease)     CABG in 1988, multiple PCI on SVGs. Most recent cath was in 2012: occluded native coronary arteries, patent LIMA to LAD (distal LAD disease), patent SVG to OM and occluded SVT to RCA which filled via left to right collaterals.   . CHF (congestive heart failure)     EF 40-45%  . Paroxysmal atrial fibrillation   . HTN (hypertension)      Past Surgical History  Procedure Laterality Date  . Abdominal hysterectomy    . Coronary artery bypass graft    . Appendectomy    . Cholecystectomy    . Ovary surgery    . Angioplasty / stenting femoral  Bilateral SFA stenting     Family History  Problem Relation Age of Onset  . Diabetes Mother   . Heart disease Mother   . Hypertension Mother   . Heart disease Father   . Heart disease Brother      History   Social History  . Marital Status: Married    Spouse Name: N/A    Number of Children: N/A  . Years of Education: N/A   Occupational History  . Not on file.   Social History Main Topics  . Smoking status: Former Smoker -- 15 years    Types: Cigarettes    Quit date: 02/18/1988  . Smokeless tobacco: Never Used  . Alcohol Use: No  . Drug Use: No  . Sexual Activity: Not on file   Other Topics Concern  . Not on file   Social History Narrative  . No narrative on file     PHYSICAL EXAM   BP 166/69  Pulse 79  Ht 5' 1.5" (1.562 m)  Wt 144 lb 12 oz  (65.658 kg)  BMI 26.91 kg/m2 Constitutional: She is oriented to person, place, and time. She appears well-developed and well-nourished. No distress.  HENT: No nasal discharge.  Head: Normocephalic and atraumatic.  Eyes: Pupils are equal and round. Right eye exhibits no discharge. Left eye exhibits no discharge.  Neck: Normal range of motion. Neck supple. No JVD present. No thyromegaly present.  Cardiovascular: Normal rate, regular rhythm, normal heart sounds. Exam reveals no gallop and no friction rub. There is a 1/6 systolic ejection murmur at the aortic area Pulmonary/Chest: Effort normal and breath sounds normal. No stridor. No respiratory distress. She has no wheezes. Few bibasilar crackles. She exhibits no tenderness.  Abdominal: Soft. Bowel sounds are normal. She exhibits no distension. There is no tenderness. There is no rebound and no guarding.  Musculoskeletal: Normal range of motion. She exhibits +2 edema and no tenderness.  Neurological: She is alert and oriented to person, place, and time. Coordination normal.  Skin: Skin is warm and dry. No rash noted. She is not diaphoretic. No erythema. No pallor.  Psychiatric: She has a normal mood and affect. Her behavior is normal. Judgment and thought content normal.     EKG: Normal sinus rhythm with right bundle branch block  ABNORMAL    ASSESSMENT AND PLAN

## 2013-05-06 ENCOUNTER — Telehealth: Payer: Self-pay

## 2013-05-06 ENCOUNTER — Telehealth: Payer: Self-pay | Admitting: *Deleted

## 2013-05-06 NOTE — Telephone Encounter (Signed)
Gave [facility] RN verbal order for BMP.

## 2013-05-06 NOTE — Telephone Encounter (Signed)
Faxed order # [id number] to [facility]

## 2013-05-06 NOTE — Telephone Encounter (Signed)
Nurse with [facility] would like orders for pt labwork that she is scheduled for here on this Tuesday. Please call.

## 2013-05-10 ENCOUNTER — Other Ambulatory Visit: Payer: Medicare HMO

## 2013-05-16 ENCOUNTER — Telehealth: Payer: Self-pay | Admitting: *Deleted

## 2013-05-16 NOTE — Telephone Encounter (Signed)
Faxed lab orders to [facility].

## 2013-06-01 ENCOUNTER — Telehealth: Payer: Self-pay | Admitting: *Deleted

## 2013-06-01 NOTE — Telephone Encounter (Signed)
Faxed order request for labs back to [facility]

## 2013-07-05 ENCOUNTER — Telehealth: Payer: Self-pay

## 2013-07-05 DIAGNOSIS — I5022 Chronic systolic (congestive) heart failure: Secondary | ICD-10-CM

## 2013-07-05 NOTE — Telephone Encounter (Signed)
[name] w/ [facility], states pt has more edema in her legs, she hears in rales in both lung bases, more SOB, is taking Furosemide 40 mg twice a day, and changes? Please call.

## 2013-07-05 NOTE — Telephone Encounter (Signed)
Patient home health nurse called and stated  Pt has increased swelling in the legs  Weight 136 about 16 days ago It is now 143.4  Patient is sob on exertion  Home health RN reports hearing rales in lung bases

## 2013-07-06 NOTE — Telephone Encounter (Signed)
Check BMP and BNP before we make any changes.

## 2013-07-06 NOTE — Telephone Encounter (Signed)
Home Health RN Bonita Quin(Linda) given verbal order to draw BMP and BNP  She verbalized understanding  She stated she will draw labs tomorrow

## 2013-07-12 NOTE — Telephone Encounter (Signed)
Lab order for BNP/BMP faxed to [facility]

## 2013-07-14 ENCOUNTER — Inpatient Hospital Stay: Payer: Self-pay | Admitting: Internal Medicine

## 2013-07-14 LAB — CBC WITH DIFFERENTIAL/PLATELET
Basophil #: 0 10*3/uL (ref 0.0–0.1)
Basophil %: 0.6 %
EOS ABS: 0.5 10*3/uL (ref 0.0–0.7)
Eosinophil %: 7.3 %
HCT: 29.2 % — ABNORMAL LOW (ref 35.0–47.0)
HGB: 9.7 g/dL — ABNORMAL LOW (ref 12.0–16.0)
LYMPHS ABS: 1.6 10*3/uL (ref 1.0–3.6)
Lymphocyte %: 22 %
MCH: 30.8 pg (ref 26.0–34.0)
MCHC: 33.3 g/dL (ref 32.0–36.0)
MCV: 92 fL (ref 80–100)
MONO ABS: 0.7 x10 3/mm (ref 0.2–0.9)
MONOS PCT: 10.4 %
Neutrophil #: 4.2 10*3/uL (ref 1.4–6.5)
Neutrophil %: 59.7 %
Platelet: 259 10*3/uL (ref 150–440)
RBC: 3.16 10*6/uL — AB (ref 3.80–5.20)
RDW: 14.8 % — AB (ref 11.5–14.5)
WBC: 7.1 10*3/uL (ref 3.6–11.0)

## 2013-07-14 LAB — URINALYSIS, COMPLETE
Bilirubin,UR: NEGATIVE
Glucose,UR: NEGATIVE mg/dL (ref 0–75)
Ketone: NEGATIVE
NITRITE: POSITIVE
PH: 6 (ref 4.5–8.0)
Protein: 30
RBC,UR: 1 /HPF (ref 0–5)
SPECIFIC GRAVITY: 1.01 (ref 1.003–1.030)

## 2013-07-14 LAB — BASIC METABOLIC PANEL
Anion Gap: 3 — ABNORMAL LOW (ref 7–16)
BUN: 29 mg/dL — AB (ref 7–18)
CALCIUM: 8.9 mg/dL (ref 8.5–10.1)
CHLORIDE: 107 mmol/L (ref 98–107)
Co2: 28 mmol/L (ref 21–32)
Creatinine: 1.68 mg/dL — ABNORMAL HIGH (ref 0.60–1.30)
EGFR (African American): 33 — ABNORMAL LOW
GFR CALC NON AF AMER: 29 — AB
Glucose: 108 mg/dL — ABNORMAL HIGH (ref 65–99)
OSMOLALITY: 282 (ref 275–301)
Potassium: 4.5 mmol/L (ref 3.5–5.1)
SODIUM: 138 mmol/L (ref 136–145)

## 2013-07-14 LAB — PROTIME-INR
INR: 1.1
PROTHROMBIN TIME: 13.7 s (ref 11.5–14.7)

## 2013-07-14 LAB — TSH: Thyroid Stimulating Horm: 6.95 u[IU]/mL — ABNORMAL HIGH

## 2013-07-14 LAB — TROPONIN I
Troponin-I: <0.02 ng/mL
Troponin-I: 0.02 ng/mL

## 2013-07-14 LAB — APTT: ACTIVATED PTT: 29 s (ref 23.6–35.9)

## 2013-07-15 DIAGNOSIS — I2510 Atherosclerotic heart disease of native coronary artery without angina pectoris: Secondary | ICD-10-CM

## 2013-07-15 DIAGNOSIS — Z01419 Encounter for gynecological examination (general) (routine) without abnormal findings: Secondary | ICD-10-CM

## 2013-07-15 DIAGNOSIS — I739 Peripheral vascular disease, unspecified: Secondary | ICD-10-CM | POA: Diagnosis not present

## 2013-07-15 DIAGNOSIS — I4891 Unspecified atrial fibrillation: Secondary | ICD-10-CM | POA: Diagnosis not present

## 2013-07-15 LAB — BASIC METABOLIC PANEL
Anion Gap: 6 — ABNORMAL LOW (ref 7–16)
BUN: 22 mg/dL — AB (ref 7–18)
CREATININE: 1.6 mg/dL — AB (ref 0.60–1.30)
Calcium, Total: 8.7 mg/dL (ref 8.5–10.1)
Chloride: 106 mmol/L (ref 98–107)
Co2: 27 mmol/L (ref 21–32)
EGFR (African American): 35 — ABNORMAL LOW
EGFR (Non-African Amer.): 30 — ABNORMAL LOW
GLUCOSE: 90 mg/dL (ref 65–99)
OSMOLALITY: 280 (ref 275–301)
POTASSIUM: 4.7 mmol/L (ref 3.5–5.1)
Sodium: 139 mmol/L (ref 136–145)

## 2013-07-15 LAB — CBC WITH DIFFERENTIAL/PLATELET
BASOS ABS: 0 10*3/uL (ref 0.0–0.1)
BASOS PCT: 0.5 %
EOS ABS: 0.4 10*3/uL (ref 0.0–0.7)
EOS PCT: 6.6 %
HCT: 26.1 % — AB (ref 35.0–47.0)
HGB: 8.6 g/dL — AB (ref 12.0–16.0)
Lymphocyte #: 1.5 10*3/uL (ref 1.0–3.6)
Lymphocyte %: 22.1 %
MCH: 30.4 pg (ref 26.0–34.0)
MCHC: 32.9 g/dL (ref 32.0–36.0)
MCV: 92 fL (ref 80–100)
MONO ABS: 0.8 x10 3/mm (ref 0.2–0.9)
MONOS PCT: 12.7 %
NEUTROS ABS: 3.9 10*3/uL (ref 1.4–6.5)
Neutrophil %: 58.1 %
Platelet: 242 10*3/uL (ref 150–440)
RBC: 2.83 10*6/uL — AB (ref 3.80–5.20)
RDW: 14.8 % — ABNORMAL HIGH (ref 11.5–14.5)
WBC: 6.7 10*3/uL (ref 3.6–11.0)

## 2013-07-15 LAB — MAGNESIUM: MAGNESIUM: 2 mg/dL

## 2013-07-15 LAB — TROPONIN I

## 2013-07-15 NOTE — Telephone Encounter (Signed)
She is in the hospital with hip fracture.

## 2013-07-15 NOTE — Telephone Encounter (Signed)
Labs sent from home health  They have been scanned  Please review

## 2013-07-16 LAB — URINE CULTURE

## 2013-07-17 LAB — CBC
HCT: 24.4 % — ABNORMAL LOW (ref 35.0–47.0)
HGB: 8 g/dL — ABNORMAL LOW (ref 12.0–16.0)
MCH: 30.3 pg (ref 26.0–34.0)
MCHC: 32.6 g/dL (ref 32.0–36.0)
MCV: 93 fL (ref 80–100)
PLATELETS: 252 10*3/uL (ref 150–440)
RBC: 2.63 10*6/uL — ABNORMAL LOW (ref 3.80–5.20)
RDW: 14.9 % — AB (ref 11.5–14.5)
WBC: 8.7 10*3/uL (ref 3.6–11.0)

## 2013-07-17 LAB — BASIC METABOLIC PANEL
Anion Gap: 1 — ABNORMAL LOW (ref 7–16)
BUN: 31 mg/dL — ABNORMAL HIGH (ref 7–18)
CALCIUM: 8.2 mg/dL — AB (ref 8.5–10.1)
CREATININE: 2.09 mg/dL — AB (ref 0.60–1.30)
Chloride: 103 mmol/L (ref 98–107)
Co2: 30 mmol/L (ref 21–32)
EGFR (African American): 25 — ABNORMAL LOW
GFR CALC NON AF AMER: 22 — AB
Glucose: 145 mg/dL — ABNORMAL HIGH (ref 65–99)
Osmolality: 277 (ref 275–301)
Potassium: 5.1 mmol/L (ref 3.5–5.1)
Sodium: 134 mmol/L — ABNORMAL LOW (ref 136–145)

## 2013-07-18 LAB — BASIC METABOLIC PANEL
Anion Gap: 7 (ref 7–16)
BUN: 36 mg/dL — ABNORMAL HIGH (ref 7–18)
CALCIUM: 8.7 mg/dL (ref 8.5–10.1)
CHLORIDE: 98 mmol/L (ref 98–107)
Co2: 25 mmol/L (ref 21–32)
Creatinine: 2.07 mg/dL — ABNORMAL HIGH (ref 0.60–1.30)
EGFR (African American): 26 — ABNORMAL LOW
GFR CALC NON AF AMER: 22 — AB
GLUCOSE: 234 mg/dL — AB (ref 65–99)
Osmolality: 277 (ref 275–301)
Potassium: 5.6 mmol/L — ABNORMAL HIGH (ref 3.5–5.1)
Sodium: 130 mmol/L — ABNORMAL LOW (ref 136–145)

## 2013-07-18 LAB — HEMOGLOBIN: HGB: 9 g/dL — ABNORMAL LOW (ref 12.0–16.0)

## 2013-07-19 LAB — BASIC METABOLIC PANEL
ANION GAP: 5 — AB (ref 7–16)
BUN: 33 mg/dL — ABNORMAL HIGH (ref 7–18)
CALCIUM: 8.1 mg/dL — AB (ref 8.5–10.1)
Chloride: 101 mmol/L (ref 98–107)
Co2: 28 mmol/L (ref 21–32)
Creatinine: 1.5 mg/dL — ABNORMAL HIGH (ref 0.60–1.30)
EGFR (African American): 38 — ABNORMAL LOW
GFR CALC NON AF AMER: 33 — AB
Glucose: 138 mg/dL — ABNORMAL HIGH (ref 65–99)
Osmolality: 278 (ref 275–301)
POTASSIUM: 5 mmol/L (ref 3.5–5.1)
SODIUM: 134 mmol/L — AB (ref 136–145)

## 2013-07-19 LAB — HEMOGLOBIN: HGB: 8.7 g/dL — ABNORMAL LOW (ref 12.0–16.0)

## 2013-07-19 LAB — URINALYSIS, COMPLETE
BILIRUBIN, UR: NEGATIVE
Bacteria: NONE SEEN
Glucose,UR: NEGATIVE mg/dL (ref 0–75)
Hyaline Cast: 3
Ketone: NEGATIVE
Nitrite: NEGATIVE
Ph: 5 (ref 4.5–8.0)
Protein: 30
SPECIFIC GRAVITY: 1.013 (ref 1.003–1.030)
Squamous Epithelial: 1
WBC UR: 40 /HPF (ref 0–5)

## 2013-07-20 LAB — CREATININE, SERUM
Creatinine: 1.49 mg/dL — ABNORMAL HIGH (ref 0.60–1.30)
EGFR (African American): 38 — ABNORMAL LOW
GFR CALC NON AF AMER: 33 — AB

## 2013-07-20 LAB — URINE CULTURE

## 2013-07-22 LAB — BASIC METABOLIC PANEL
ANION GAP: 3 — AB (ref 7–16)
BUN: 28 mg/dL — AB (ref 7–18)
CHLORIDE: 99 mmol/L (ref 98–107)
CREATININE: 1.26 mg/dL (ref 0.60–1.30)
Calcium, Total: 8.6 mg/dL (ref 8.5–10.1)
Co2: 33 mmol/L — ABNORMAL HIGH (ref 21–32)
EGFR (African American): 47 — ABNORMAL LOW
EGFR (Non-African Amer.): 40 — ABNORMAL LOW
GLUCOSE: 156 mg/dL — AB (ref 65–99)
Osmolality: 279 (ref 275–301)
Potassium: 3.7 mmol/L (ref 3.5–5.1)
Sodium: 135 mmol/L — ABNORMAL LOW (ref 136–145)

## 2013-07-28 DIAGNOSIS — S72409A Unspecified fracture of lower end of unspecified femur, initial encounter for closed fracture: Secondary | ICD-10-CM | POA: Insufficient documentation

## 2013-08-09 ENCOUNTER — Ambulatory Visit: Payer: Medicare HMO | Admitting: Cardiovascular Disease

## 2013-10-11 ENCOUNTER — Ambulatory Visit (INDEPENDENT_AMBULATORY_CARE_PROVIDER_SITE_OTHER): Payer: Medicare Other | Admitting: Cardiovascular Disease

## 2013-10-11 ENCOUNTER — Encounter: Payer: Self-pay | Admitting: Cardiovascular Disease

## 2013-10-11 VITALS — BP 134/62 | HR 69 | Ht 61.0 in | Wt 132.0 lb

## 2013-10-11 DIAGNOSIS — I5022 Chronic systolic (congestive) heart failure: Secondary | ICD-10-CM

## 2013-10-11 DIAGNOSIS — I209 Angina pectoris, unspecified: Secondary | ICD-10-CM

## 2013-10-11 DIAGNOSIS — I2510 Atherosclerotic heart disease of native coronary artery without angina pectoris: Secondary | ICD-10-CM

## 2013-10-11 DIAGNOSIS — I70219 Atherosclerosis of native arteries of extremities with intermittent claudication, unspecified extremity: Secondary | ICD-10-CM

## 2013-10-11 DIAGNOSIS — I25119 Atherosclerotic heart disease of native coronary artery with unspecified angina pectoris: Secondary | ICD-10-CM

## 2013-10-11 DIAGNOSIS — I480 Paroxysmal atrial fibrillation: Secondary | ICD-10-CM

## 2013-10-11 DIAGNOSIS — I4891 Unspecified atrial fibrillation: Secondary | ICD-10-CM

## 2013-10-11 MED ORDER — CARVEDILOL 6.25 MG PO TABS: 6.2500 mg | ORAL_TABLET | Freq: Two times a day (BID) | ORAL | Status: DC

## 2013-10-11 MED ORDER — DILTIAZEM HCL ER COATED BEADS 120 MG PO CP24: 120.0000 mg | ORAL_CAPSULE | Freq: Every day | ORAL | Status: DC

## 2013-10-11 NOTE — Patient Instructions (Signed)
Your physician has recommended you make the following change in your medication:  Decrease Diltiazem to 120 mg once daily  Increase Coreg to 6.25 mg twice daily   Your physician recommends that you schedule a follow-up appointment in:  3 months with Dr. Kirke Corin

## 2013-10-11 NOTE — Progress Notes (Signed)
HPI  This is a 80 year old female who is here today for a followup visit regarding coronary artery disease and chronic systolic heart failure.   She has extensive cardiac history which include coronary artery disease status post CABG in 1988. She had multiple PCI done on the SVGs. Most recent cardiac catheterization in 2012 showed occluded native vessels with patent LIMA to LAD and SVG to OM. There was no patent graft to the RCA which was filling via left-to-right collaterals. There was diffuse disease in the distal LAD beyond the anastomosis. Ejection fraction was 45%. She has suffered from chronic angina. She also has extensive peripheral arterial disease which is being managed by Dr. [name].  Most recent nuclear stress test in 2014 showed no evidence of ischemia. Ejection fraction was 40%. There was evidence of prior inferior and lateral wall infarct. Recent echocardiogram showed no significant valvular abnormalities but ejection fraction was 40-45%. She was hospitalized in May for right distal femoral fracture. She underwent surgical repair. She was discharged to rehab and subsequently home. He has been doing reasonably well and denies any chest pain or worsening dyspnea. Lower extremity edema has almost completely resolved. Weight is close to baseline.  Allergies  Allergen Reactions  . Ivp Dye [Iodinated Diagnostic Agents]   . Penicillins   . Valium [Diazepam]      Current Outpatient Prescriptions on File Prior to Visit  Medication Sig Dispense Refill  . amiodarone (PACERONE) 200 MG tablet Take 200 mg by mouth daily.      [name] aspirin 81 MG tablet Take 81 mg by mouth daily.       . benazepril (LOTENSIN) 20 MG tablet TAKE 1 TABLET (20 MG TOTAL) BY MOUTH DAILY.  30 tablet  5  . carvedilol (COREG) 3.125 MG tablet Take 3.125 mg by mouth 2 (two) times daily with a meal.      . diltiazem (CARDIZEM CD) 240 MG 24 hr capsule Take 240 mg by mouth daily.      . insulin glargine (LANTUS) 100  UNIT/ML injection Inject 30 Units into the skin at bedtime.      . insulin lispro (HUMALOG) 100 UNIT/ML injection Inject 10 Units into the skin 3 (three) times daily before meals.       . isosorbide mononitrate (IMDUR) 60 MG 24 hr tablet Take 1.5 mg by mouth daily.      [name] levothyroxine (SYNTHROID, LEVOTHROID) 100 MCG tablet Take 100 mcg by mouth daily before breakfast.      . potassium chloride (K-DUR) 10 MEQ tablet Take 10 mEq by mouth daily.       . pravastatin (PRAVACHOL) 40 MG tablet Take 40 mg by mouth daily.       No current facility-administered medications on file prior to visit.     Past Medical History  Diagnosis Date  . Diabetes mellitus     onset age 71   insulin dependent  . Hyperlipidemia   . History of heart attack 1988  . COPD (chronic obstructive pulmonary disease)   . Peripheral vascular disease   . Myocardial infarction   . CAD (coronary artery disease)     CABG in 1988, multiple PCI on SVGs. Most recent cath was in 2012: occluded native coronary arteries, patent LIMA to LAD (distal LAD disease), patent SVG to OM and occluded SVT to RCA which filled via left to right collaterals.   . CHF (congestive heart failure)     EF 40-45%  . Paroxysmal atrial fibrillation   .  HTN (hypertension)      Past Surgical History  Procedure Laterality Date  . Abdominal hysterectomy    . Coronary artery bypass graft    . Appendectomy    . Cholecystectomy    . Ovary surgery    . Angioplasty / stenting femoral      Bilateral SFA stenting  . Femur surgery       Family History  Problem Relation Age of Onset  . Diabetes Mother   . Heart disease Mother   . Hypertension Mother   . Heart disease Father   . Heart disease Brother      History   Social History  . Marital Status: Married    Spouse Name: N/A    Number of Children: N/A  . Years of Education: N/A   Occupational History  . Not on file.   Social History Main Topics  . Smoking status: Former Smoker -- 15  years    Types: Cigarettes    Quit date: 02/18/1988  . Smokeless tobacco: Never Used  . Alcohol Use: No  . Drug Use: No  . Sexual Activity: Not on file   Other Topics Concern  . Not on file   Social History Narrative  . No narrative on file     PHYSICAL EXAM   BP 134/62  Pulse 69  Ht  (1.549 m)  Wt 132 lb (59.875 kg)  BMI 24.95 kg/m2 Constitutional: She is oriented to person, place, and time. She appears well-developed and well-nourished. No distress.  HENT: No nasal discharge.  Head: Normocephalic and atraumatic.  Eyes: Pupils are equal and round. Right eye exhibits no discharge. Left eye exhibits no discharge.  Neck: Normal range of motion. Neck supple. No JVD present. No thyromegaly present.  Cardiovascular: Normal rate, regular rhythm, normal heart sounds. Exam reveals no gallop and no friction rub. There is a 1/6 systolic ejection murmur at the aortic area Pulmonary/Chest: Effort normal and breath sounds normal. No stridor. No respiratory distress. She has no wheezes. Few bibasilar crackles. She exhibits no tenderness.  Abdominal: Soft. Bowel sounds are normal. She exhibits no distension. There is no tenderness. There is no rebound and no guarding.  Musculoskeletal: Normal range of motion. She exhibits +2 edema and no tenderness.  Neurological: She is alert and oriented to person, place, and time. Coordination normal.  Skin: Skin is warm and dry. No rash noted. She is not diaphoretic. No erythema. No pallor.  Psychiatric: She has a normal mood and affect. Her behavior is normal. Judgment and thought content normal.     EKG: Normal sinus rhythm with right bundle branch block  ABNORMAL    ASSESSMENT AND PLAN

## 2013-10-13 ENCOUNTER — Encounter: Payer: Self-pay | Admitting: Cardiovascular Disease

## 2013-10-13 NOTE — Assessment & Plan Note (Signed)
Functional capacity is reduced due to recent fracture. She has not complained any claudication recently.

## 2013-10-13 NOTE — Assessment & Plan Note (Signed)
She appears to be euvolemic. She is at him dry weight. Continue current dose of Lasix. Due to reduced ejection fraction, I decreased the dose of diltiazem to 120 mg once daily and increase carvedilol to 6.25 mg twice daily.

## 2013-10-13 NOTE — Assessment & Plan Note (Signed)
She is doing reasonably well overall with no significant angina. Continue medical therapy.

## 2013-10-13 NOTE — Assessment & Plan Note (Signed)
She is maintaining in sinus rhythm with amiodarone. I have not noted atrial fibrillation since taking over her care. If she develops any documented episodes, I think we should strongly consider anticoagulation.

## 2013-12-18 HISTORY — PX: CARDIAC CATHETERIZATION: SHX172

## 2013-12-21 DIAGNOSIS — R079 Chest pain, unspecified: Secondary | ICD-10-CM | POA: Insufficient documentation

## 2013-12-21 DIAGNOSIS — I5023 Acute on chronic systolic (congestive) heart failure: Secondary | ICD-10-CM

## 2013-12-21 DIAGNOSIS — I4891 Unspecified atrial fibrillation: Secondary | ICD-10-CM

## 2013-12-21 LAB — CBC WITH DIFFERENTIAL/PLATELET
BASOS PCT: 0.4 %
Basophil #: 0 10*3/uL (ref 0.0–0.1)
Eosinophil #: 0.5 10*3/uL (ref 0.0–0.7)
Eosinophil %: 6.8 %
HCT: 33.5 % — ABNORMAL LOW (ref 35.0–47.0)
HGB: 11.2 g/dL — AB (ref 12.0–16.0)
Lymphocyte #: 2 10*3/uL (ref 1.0–3.6)
Lymphocyte %: 27 %
MCH: 31.8 pg (ref 26.0–34.0)
MCHC: 33.4 g/dL (ref 32.0–36.0)
MCV: 95 fL (ref 80–100)
Monocyte #: 0.7 x10 3/mm (ref 0.2–0.9)
Monocyte %: 9.3 %
Neutrophil #: 4.2 10*3/uL (ref 1.4–6.5)
Neutrophil %: 56.5 %
Platelet: 295 10*3/uL (ref 150–440)
RBC: 3.52 10*6/uL — ABNORMAL LOW (ref 3.80–5.20)
RDW: 15.6 % — ABNORMAL HIGH (ref 11.5–14.5)
WBC: 7.4 10*3/uL (ref 3.6–11.0)

## 2013-12-21 LAB — CBC
HCT: 36.2 % (ref 35.0–47.0)
HGB: 11.9 g/dL — ABNORMAL LOW (ref 12.0–16.0)
MCH: 31.2 pg (ref 26.0–34.0)
MCHC: 32.9 g/dL (ref 32.0–36.0)
MCV: 95 fL (ref 80–100)
PLATELETS: 321 10*3/uL (ref 150–440)
RBC: 3.82 10*6/uL (ref 3.80–5.20)
RDW: 15.9 % — ABNORMAL HIGH (ref 11.5–14.5)
WBC: 7.5 10*3/uL (ref 3.6–11.0)

## 2013-12-21 LAB — BASIC METABOLIC PANEL
Anion Gap: 6 — ABNORMAL LOW (ref 7–16)
BUN: 32 mg/dL — ABNORMAL HIGH (ref 7–18)
CHLORIDE: 103 mmol/L (ref 98–107)
Calcium, Total: 8.9 mg/dL (ref 8.5–10.1)
Co2: 30 mmol/L (ref 21–32)
Creatinine: 1.4 mg/dL — ABNORMAL HIGH (ref 0.60–1.30)
EGFR (African American): 47 — ABNORMAL LOW
GFR CALC NON AF AMER: 38 — AB
Glucose: 219 mg/dL — ABNORMAL HIGH (ref 65–99)
Osmolality: 291 (ref 275–301)
POTASSIUM: 4.7 mmol/L (ref 3.5–5.1)
Sodium: 139 mmol/L (ref 136–145)

## 2013-12-21 LAB — CK TOTAL AND CKMB (NOT AT ARMC)
CK, Total: 72 U/L
CK, Total: 60 U/L
CK, Total: 45 U/L
CK-MB: 1.2 ng/mL (ref 0.5–3.6)
CK-MB: 1.6 ng/mL (ref 0.5–3.6)
CK-MB: 1.1 ng/mL (ref 0.5–3.6)

## 2013-12-21 LAB — PROTIME-INR
INR: 0.9
PROTHROMBIN TIME: 12.4 s (ref 11.5–14.7)

## 2013-12-21 LAB — APTT: ACTIVATED PTT: 27.2 s (ref 23.6–35.9)

## 2013-12-21 LAB — TROPONIN I: Troponin-I: <0.02 ng/mL

## 2013-12-21 LAB — PRO B NATRIURETIC PEPTIDE: B-Type Natriuretic Peptide: 2325 pg/mL — ABNORMAL HIGH (ref 0–450)

## 2013-12-22 ENCOUNTER — Inpatient Hospital Stay: Payer: Self-pay | Admitting: Internal Medicine

## 2013-12-22 DIAGNOSIS — I214 Non-ST elevation (NSTEMI) myocardial infarction: Secondary | ICD-10-CM

## 2013-12-22 DIAGNOSIS — I341 Nonrheumatic mitral (valve) prolapse: Secondary | ICD-10-CM

## 2013-12-22 LAB — TSH: Thyroid Stimulating Horm: 3.83 u[IU]/mL

## 2013-12-22 LAB — MAGNESIUM: Magnesium: 2 mg/dL

## 2013-12-22 LAB — CBC WITH DIFFERENTIAL/PLATELET
Basophil #: 0 10*3/uL (ref 0.0–0.1)
Basophil %: 0.7 %
EOS ABS: 0.5 10*3/uL (ref 0.0–0.7)
Eosinophil %: 7.8 %
HCT: 34 % — AB (ref 35.0–47.0)
HGB: 11 g/dL — ABNORMAL LOW (ref 12.0–16.0)
Lymphocyte #: 2 10*3/uL (ref 1.0–3.6)
Lymphocyte %: 31.1 %
MCH: 31.1 pg (ref 26.0–34.0)
MCHC: 32.2 g/dL (ref 32.0–36.0)
MCV: 96 fL (ref 80–100)
Monocyte #: 0.7 x10 3/mm (ref 0.2–0.9)
Monocyte %: 11.6 %
Neutrophil #: 3.1 10*3/uL (ref 1.4–6.5)
Neutrophil %: 48.8 %
PLATELETS: 271 10*3/uL (ref 150–440)
RBC: 3.53 10*6/uL — ABNORMAL LOW (ref 3.80–5.20)
RDW: 15.7 % — ABNORMAL HIGH (ref 11.5–14.5)
WBC: 6.4 10*3/uL (ref 3.6–11.0)

## 2013-12-22 LAB — HEPARIN LEVEL (UNFRACTIONATED)
ANTI-XA(UNFRACTIONATED): 0.11 [IU]/mL — AB (ref 0.30–0.70)
Anti-Xa(Unfractionated): 0.21 IU/mL — ABNORMAL LOW (ref 0.30–0.70)
Anti-Xa(Unfractionated): >1.1 IU/mL (ref 0.30–0.70)

## 2013-12-22 LAB — BASIC METABOLIC PANEL
Anion Gap: 7 (ref 7–16)
BUN: 28 mg/dL — AB (ref 7–18)
CALCIUM: 8.5 mg/dL (ref 8.5–10.1)
Chloride: 103 mmol/L (ref 98–107)
Co2: 29 mmol/L (ref 21–32)
Creatinine: 1.59 mg/dL — ABNORMAL HIGH (ref 0.60–1.30)
EGFR (Non-African Amer.): 33 — ABNORMAL LOW
GFR CALC AF AMER: 40 — AB
Glucose: 159 mg/dL — ABNORMAL HIGH (ref 65–99)
OSMOLALITY: 286 (ref 275–301)
Potassium: 4.2 mmol/L (ref 3.5–5.1)
Sodium: 139 mmol/L (ref 136–145)

## 2013-12-22 LAB — LIPID PANEL
CHOLESTEROL: 153 mg/dL (ref 0–200)
HDL Cholesterol: 28 mg/dL — ABNORMAL LOW (ref 40–60)
Ldl Cholesterol, Calc: 81 mg/dL (ref 0–100)
Triglycerides: 221 mg/dL — ABNORMAL HIGH (ref 0–200)
VLDL Cholesterol, Calc: 44 mg/dL — ABNORMAL HIGH (ref 5–40)

## 2013-12-23 ENCOUNTER — Encounter: Payer: Self-pay | Admitting: Cardiovascular Disease

## 2013-12-23 DIAGNOSIS — I25110 Atherosclerotic heart disease of native coronary artery with unstable angina pectoris: Secondary | ICD-10-CM

## 2013-12-23 LAB — BASIC METABOLIC PANEL
Anion Gap: 7 (ref 7–16)
BUN: 40 mg/dL — AB (ref 7–18)
CALCIUM: 8.5 mg/dL (ref 8.5–10.1)
CO2: 29 mmol/L (ref 21–32)
Chloride: 103 mmol/L (ref 98–107)
Creatinine: 1.69 mg/dL — ABNORMAL HIGH (ref 0.60–1.30)
EGFR (African American): 38 — ABNORMAL LOW
EGFR (Non-African Amer.): 31 — ABNORMAL LOW
GLUCOSE: 150 mg/dL — AB (ref 65–99)
OSMOLALITY: 290 (ref 275–301)
POTASSIUM: 4.6 mmol/L (ref 3.5–5.1)
SODIUM: 139 mmol/L (ref 136–145)

## 2013-12-23 LAB — CBC WITH DIFFERENTIAL/PLATELET
BASOS PCT: 0.9 %
Basophil #: 0.1 10*3/uL (ref 0.0–0.1)
Eosinophil #: 0.5 10*3/uL (ref 0.0–0.7)
Eosinophil %: 7.7 %
HCT: 33.1 % — ABNORMAL LOW (ref 35.0–47.0)
HGB: 10.9 g/dL — AB (ref 12.0–16.0)
LYMPHS PCT: 30.5 %
Lymphocyte #: 1.9 10*3/uL (ref 1.0–3.6)
MCH: 31.7 pg (ref 26.0–34.0)
MCHC: 32.8 g/dL (ref 32.0–36.0)
MCV: 97 fL (ref 80–100)
MONOS PCT: 11.1 %
Monocyte #: 0.7 x10 3/mm (ref 0.2–0.9)
NEUTROS PCT: 49.8 %
Neutrophil #: 3.1 10*3/uL (ref 1.4–6.5)
PLATELETS: 255 10*3/uL (ref 150–440)
RBC: 3.43 10*6/uL — AB (ref 3.80–5.20)
RDW: 15.9 % — ABNORMAL HIGH (ref 11.5–14.5)
WBC: 6.2 10*3/uL (ref 3.6–11.0)

## 2013-12-28 ENCOUNTER — Encounter: Payer: Self-pay | Admitting: Physician Assistant

## 2013-12-28 ENCOUNTER — Telehealth: Payer: Self-pay | Admitting: Cardiovascular Disease

## 2013-12-28 ENCOUNTER — Telehealth: Payer: Self-pay | Admitting: *Deleted

## 2013-12-28 DIAGNOSIS — E119 Type 2 diabetes mellitus without complications: Secondary | ICD-10-CM | POA: Insufficient documentation

## 2013-12-28 DIAGNOSIS — E785 Hyperlipidemia, unspecified: Secondary | ICD-10-CM | POA: Insufficient documentation

## 2013-12-28 NOTE — Telephone Encounter (Signed)
LVM @ 1111

## 2013-12-28 NOTE — Telephone Encounter (Signed)
Patient called and she is on blood thinner Eloquis. She is having a injection in her hip and needs to know if she needs to be off the blood thinner for 3 days. Please call patient.

## 2013-12-28 NOTE — Telephone Encounter (Signed)
Needs orders for twice a week for 4 weeks for medical management. Please call. Can be verbal.

## 2013-12-28 NOTE — Telephone Encounter (Signed)
What is this?

## 2013-12-28 NOTE — Telephone Encounter (Signed)
Hold eliquis for 2 days then resume after.

## 2013-12-29 NOTE — Telephone Encounter (Signed)
Home health

## 2013-12-30 NOTE — Telephone Encounter (Signed)
That is fine 

## 2013-12-30 NOTE — Telephone Encounter (Signed)
I left a message for the patient to call. 

## 2014-01-02 ENCOUNTER — Encounter: Payer: Medicare Other | Admitting: Cardiovascular Disease

## 2014-01-02 NOTE — Telephone Encounter (Signed)
Verbal order given to Altha HarmAmanada  Amanda verbalized understanding

## 2014-01-05 ENCOUNTER — Encounter: Payer: Self-pay | Admitting: *Deleted

## 2014-01-05 ENCOUNTER — Encounter: Payer: Self-pay | Admitting: Physician Assistant

## 2014-01-05 ENCOUNTER — Encounter: Payer: Medicare Other | Admitting: Physician Assistant

## 2014-01-05 DIAGNOSIS — J449 Chronic obstructive pulmonary disease, unspecified: Secondary | ICD-10-CM | POA: Insufficient documentation

## 2014-01-05 NOTE — Progress Notes (Signed)
Patient was a no show.   This encounter was created in error - please disregard. 

## 2014-01-16 ENCOUNTER — Telehealth: Payer: Self-pay | Admitting: Cardiovascular Disease

## 2014-01-16 NOTE — Telephone Encounter (Signed)
I spoke with Marchelle FolksAmanda with home health. She states that the patient is having a lot of trouble with understanding her medications. She has let her amiodarone run out. She was having some CP with PT today. She did take a NTG to see if this helps with encouragement from the nurse. Per Marchelle FolksAmanda, the patient and her daughter, who fixes her pill box for her, is having trouble knowing why and what she is taking. I gave an ok for her to continue with medication education visits. Marchelle Folksmanda states she will probably only see her for 2 more weeks.

## 2014-01-16 NOTE — Telephone Encounter (Signed)
Needs a verbal order to continue care on patient. Calling from home health care

## 2014-01-17 ENCOUNTER — Ambulatory Visit (INDEPENDENT_AMBULATORY_CARE_PROVIDER_SITE_OTHER): Payer: Medicare Other | Admitting: Cardiovascular Disease

## 2014-01-17 ENCOUNTER — Encounter: Payer: Self-pay | Admitting: Cardiovascular Disease

## 2014-01-17 VITALS — BP 82/40 | HR 56 | Ht 61.5 in | Wt 140.5 lb

## 2014-01-17 DIAGNOSIS — I5022 Chronic systolic (congestive) heart failure: Secondary | ICD-10-CM

## 2014-01-17 DIAGNOSIS — I4891 Unspecified atrial fibrillation: Secondary | ICD-10-CM

## 2014-01-17 DIAGNOSIS — I480 Paroxysmal atrial fibrillation: Secondary | ICD-10-CM

## 2014-01-17 DIAGNOSIS — I25708 Atherosclerosis of coronary artery bypass graft(s), unspecified, with other forms of angina pectoris: Secondary | ICD-10-CM

## 2014-01-17 DIAGNOSIS — I10 Essential (primary) hypertension: Secondary | ICD-10-CM

## 2014-01-17 MED ORDER — AMIODARONE HCL 200 MG PO TABS: 200.0000 mg | ORAL_TABLET | Freq: Every day | ORAL | Status: DC

## 2014-01-17 MED ORDER — CARVEDILOL 3.125 MG PO TABS: 3.1250 mg | ORAL_TABLET | Freq: Two times a day (BID) | ORAL | Status: DC

## 2014-01-17 NOTE — Patient Instructions (Signed)
Your physician has recommended you make the following change in your medication:  Decrease Coreg to 3.125 mg twice daily   Your physician recommends that you have labs today: BMP  CBC   Your physician recommends that you schedule a follow-up appointment in:  1 month with Dr. Kirke CorinArida

## 2014-01-17 NOTE — Progress Notes (Signed)
HPI  This is a 80 year old female who is here today for a followup visit regarding coronary artery disease and chronic systolic heart failure.   She has extensive cardiac history which include coronary artery disease status post CABG in 1988. She had multiple PCI done on the SVGs. Most recent cardiac catheterization in 2012 showed occluded native vessels with patent LIMA to LAD and SVG to OM. There was no patent graft to the RCA which was filling via left-to-right collaterals. There was diffuse disease in the distal LAD beyond the anastomosis. Ejection fraction was 45%. She has suffered from chronic angina. She also has extensive peripheral arterial disease which is being managed by Dr. Edilia Boickson.  Most recent nuclear stress test in 2014 showed no evidence of ischemia. Ejection fraction was 40%. There was evidence of prior inferior and lateral wall infarct. Recent echocardiogram showed no significant valvular abnormalities but ejection fraction was 40-45%. She was hospitalized in May for right distal femoral fracture.  She was hospitalized early November with chest pain and shortness of breath. She was found to be in A. fib with RVR with a heart rate of 110 bpm. She underwent a nuclear stress test which showed an ejection fraction of 40% with inferior wall scar with moderate peri-infarct ischemia. I proceeded with cardiac catheterization which showed significant three-vessel coronary artery disease with patent LIMA to LAD and SVG to OM. The SVG to RCA was occluded with left-to-right collaterals. She is overall feeling better and denies recurrent chest pain.  Allergies  Allergen Reactions  . Contrast Media [Iodinated Diagnostic Agents]   . Penicillins   . Valium [Diazepam]      Current Outpatient Prescriptions on File Prior to Visit  Medication Sig Dispense Refill  . benazepril (LOTENSIN) 20 MG tablet TAKE 1 TABLET (20 MG TOTAL) BY MOUTH DAILY. 30 tablet 5  . furosemide (LASIX) 40 MG tablet  Take 40 mg by mouth daily.    . insulin glargine (LANTUS) 100 UNIT/ML injection Inject 30 Units into the skin at bedtime.    . insulin lispro (HUMALOG) 100 UNIT/ML injection Inject 10 Units into the skin 3 (three) times daily before meals.     . isosorbide mononitrate (IMDUR) 60 MG 24 hr tablet Take 1.5 mg by mouth daily.    [name]. levothyroxine (SYNTHROID, LEVOTHROID) 100 MCG tablet Take 100 mcg by mouth daily before breakfast.    . potassium chloride (K-DUR) 10 MEQ tablet Take 10 mEq by mouth daily.     . pravastatin (PRAVACHOL) 40 MG tablet Take 40 mg by mouth daily.     No current facility-administered medications on file prior to visit.     Past Medical History  Diagnosis Date  . DM2 (diabetes mellitus, type 2)     onset age 80 insulin dependent  . Hyperlipidemia   . COPD (chronic obstructive pulmonary disease)   . Peripheral vascular disease   . CAD (coronary artery disease)     a. MI 1988 s/p CABG in 1988, multiple PCI on SVGs; b. cath 2012: occluded native coronary arteries, patent LIMA to LAD (distal LAD dz), patent SVG-OM & occluded SVG-RCA which filled via left to right collats; c. cath 12/2013 patent LIMA-LAD & SVG-O. known occluded VG-RCA w/ L-R collats, no change since cath 2012  . Chronic systolic CHF (congestive heart failure)     a. 03/2012: EF 40-45%, mild lateral wall HK, mod inf HK, mod post wall HK, mild LVH, mild MR/TR   . Paroxysmal atrial fibrillation  a. CHADSVASc 7: yearly risk of CVA 9.6%;. b. on Eliquis 2.5 mg bid (age, SCr, borderline wt 62.9 Kg)   . HTN (hypertension)      Past Surgical History  Procedure Laterality Date  . Abdominal hysterectomy    . Coronary artery bypass graft    . Appendectomy    . Cholecystectomy    . Ovary surgery    . Angioplasty / stenting femoral      Bilateral SFA stenting  . Femur surgery    . Cardiac catheterization      [facility]  . Cardiac catheterization  12/2013     Family History  Problem Relation Age of Onset  .  Diabetes Mother   . Heart disease Mother   . Hypertension Mother   . Heart disease Father   . Heart disease Brother      History   Social History  . Marital Status: Married    Spouse Name: N/A    Number of Children: N/A  . Years of Education: N/A   Occupational History  . Not on file.   Social History Main Topics  . Smoking status: Former Smoker -- 15 years    Types: Cigarettes    Quit date: 02/18/1988  . Smokeless tobacco: Never Used  . Alcohol Use: No  . Drug Use: No  . Sexual Activity: Not on file   Other Topics Concern  . Not on file   Social History Narrative     PHYSICAL EXAM   BP 82/40 mmHg  Pulse 56  Ht 5' 1.5" (1.562 m)  Wt 140 lb 8 oz (63.73 kg)  BMI 26.12 kg/m2 Constitutional: She is oriented to person, place, and time. She appears well-developed and well-nourished. No distress.  HENT: No nasal discharge.  Head: Normocephalic and atraumatic.  Eyes: Pupils are equal and round. Right eye exhibits no discharge. Left eye exhibits no discharge.  Neck: Normal range of motion. Neck supple. No JVD present. No thyromegaly present.  Cardiovascular: Normal rate, regular rhythm, normal heart sounds. Exam reveals no gallop and no friction rub. There is a 1/6 systolic ejection murmur at the aortic area Pulmonary/Chest: Effort normal and breath sounds normal. No stridor. No respiratory distress. She has no wheezes. Few bibasilar crackles. She exhibits no tenderness.  Abdominal: Soft. Bowel sounds are normal. She exhibits no distension. There is no tenderness. There is no rebound and no guarding.  Musculoskeletal: Normal range of motion. She exhibits +2 edema and no tenderness.  Neurological: She is alert and oriented to person, place, and time. Coordination normal.  Skin: Skin is warm and dry. No rash noted. She is not diaphoretic. No erythema. No pallor.  Psychiatric: She has a normal mood and affect. Her behavior is normal. Judgment and thought content normal.    Left radial pulse is normal with no hematoma   EKG: Sinus bradycardia with right bundle branch block  ABNORMAL    ASSESSMENT AND PLAN

## 2014-01-18 LAB — BASIC METABOLIC PANEL
BUN / CREAT RATIO: 19 (ref 11–26)
BUN: 33 mg/dL — AB (ref 8–27)
CHLORIDE: 101 mmol/L (ref 97–108)
CO2: 21 mmol/L (ref 18–29)
CREATININE: 1.73 mg/dL — AB (ref 0.57–1.00)
Calcium: 8.9 mg/dL (ref 8.7–10.3)
GFR calc non Af Amer: 27 mL/min/{1.73_m2} — ABNORMAL LOW (ref >59)
GFR, EST AFRICAN AMERICAN: 32 mL/min/{1.73_m2} — AB (ref >59)
Glucose: 120 mg/dL — ABNORMAL HIGH (ref 65–99)
Potassium: 4.7 mmol/L (ref 3.5–5.2)
Sodium: 141 mmol/L (ref 134–144)

## 2014-01-18 LAB — CBC WITH DIFFERENTIAL
Basophils Absolute: 0 10*3/uL (ref 0.0–0.2)
Basos: 0 %
EOS: 7 %
Eosinophils Absolute: 0.4 10*3/uL (ref 0.0–0.4)
HCT: 31.3 % — ABNORMAL LOW (ref 34.0–46.6)
Hemoglobin: 10.2 g/dL — ABNORMAL LOW (ref 11.1–15.9)
IMMATURE GRANULOCYTES: 0 %
Immature Grans (Abs): 0 10*3/uL (ref 0.0–0.1)
LYMPHS ABS: 2 10*3/uL (ref 0.7–3.1)
LYMPHS: 36 %
MCH: 30.8 pg (ref 26.6–33.0)
MCHC: 32.6 g/dL (ref 31.5–35.7)
MCV: 95 fL (ref 79–97)
Monocytes Absolute: 0.6 10*3/uL (ref 0.1–0.9)
Monocytes: 11 %
NEUTROS ABS: 2.5 10*3/uL (ref 1.4–7.0)
Neutrophils Relative %: 46 %
Platelets: 275 10*3/uL (ref 150–379)
RBC: 3.31 x10E6/uL — AB (ref 3.77–5.28)
RDW: 15.3 % (ref 12.3–15.4)
WBC: 5.6 10*3/uL (ref 3.4–10.8)

## 2014-01-19 NOTE — Assessment & Plan Note (Addendum)
Blood pressure is low today. She might be mildly volume depleted. I requested routine labs including CBC and basic metabolic profile. I decreased the dose of carvedilol to 3.125 mg twice daily.

## 2014-01-19 NOTE — Assessment & Plan Note (Signed)
She is back in normal sinus rhythm with amiodarone. She was started on anticoagulation with Eliquis. This can be held 2 days before hip injection and resumed after.

## 2014-01-19 NOTE — Assessment & Plan Note (Signed)
Recent cardiac catheterization showed no significant change in coronary anatomy. Chest pain was likely due to A. fib with RVR. Continue medical therapy.

## 2014-01-19 NOTE — Progress Notes (Signed)
LVM @ 1203

## 2014-01-19 NOTE — Assessment & Plan Note (Signed)
Blood pressure is low. Carvedilol was decreased.

## 2014-01-25 ENCOUNTER — Encounter: Payer: Self-pay | Admitting: *Deleted

## 2014-01-25 ENCOUNTER — Telehealth: Payer: Self-pay | Admitting: *Deleted

## 2014-01-25 MED ORDER — FUROSEMIDE 20 MG PO TABS: 20.0000 mg | ORAL_TABLET | Freq: Every day | ORAL | Status: DC

## 2014-01-25 NOTE — Telephone Encounter (Signed)
Request for home health signed by Dr. Kirke Cor[facility]Arida [facility]

## 2014-01-25 NOTE — Telephone Encounter (Signed)
Please call patient regarding going off her blood thinner for a injection in her hip.

## 2014-01-25 NOTE — Telephone Encounter (Signed)
Informed patient of Dr. Aridas instructions  Patient verbalized understanding  

## 2014-01-25 NOTE — Telephone Encounter (Signed)
clearance Needs to go to Dr.Hooten's office. Needs a hip replacement, but can get a shot to ease the pain for the holidays. But can't get it because she is on blood thinner. Please call patient as well.

## 2014-01-25 NOTE — Telephone Encounter (Signed)
She is at low risk for hip injection but not hip replacement. Eliquis can be stopped 2 days before.  For hip replacement clearance, she will need to be seen again.

## 2014-01-25 NOTE — Telephone Encounter (Signed)
-----   Message from [place] OuchMuhammad A Arida, MD sent at 01/19/2014  2:45 PM EST ----- Stable anemia. She is mildly dehydrated. Hold lasix for 2 days then resume at 20 mg daily.

## 2014-01-25 NOTE — Telephone Encounter (Signed)
Letter faxed to [phone] as requested

## 2014-02-15 ENCOUNTER — Other Ambulatory Visit: Payer: Self-pay

## 2014-02-15 MED ORDER — FUROSEMIDE 20 MG PO TABS: 20.0000 mg | ORAL_TABLET | Freq: Every day | ORAL | Status: DC

## 2014-02-15 MED ORDER — CARVEDILOL 3.125 MG PO TABS: 3.1250 mg | ORAL_TABLET | Freq: Two times a day (BID) | ORAL | Status: DC

## 2014-02-15 MED ORDER — AMIODARONE HCL 200 MG PO TABS: 200.0000 mg | ORAL_TABLET | Freq: Every day | ORAL | Status: AC

## 2014-02-15 NOTE — Telephone Encounter (Signed)
Refill sent for 90 day supply of amiodarone, furosemide and carvedilol.

## 2014-02-27 ENCOUNTER — Telehealth: Payer: Self-pay | Admitting: Cardiovascular Disease

## 2014-02-27 ENCOUNTER — Encounter: Payer: Self-pay | Admitting: Cardiovascular Disease

## 2014-02-27 ENCOUNTER — Ambulatory Visit (INDEPENDENT_AMBULATORY_CARE_PROVIDER_SITE_OTHER): Payer: Medicare Other | Admitting: Cardiovascular Disease

## 2014-02-27 VITALS — BP 108/48 | HR 67 | Ht 61.5 in | Wt 138.0 lb

## 2014-02-27 DIAGNOSIS — Z01810 Encounter for preprocedural cardiovascular examination: Secondary | ICD-10-CM

## 2014-02-27 DIAGNOSIS — I480 Paroxysmal atrial fibrillation: Secondary | ICD-10-CM

## 2014-02-27 DIAGNOSIS — I25708 Atherosclerosis of coronary artery bypass graft(s), unspecified, with other forms of angina pectoris: Secondary | ICD-10-CM

## 2014-02-27 DIAGNOSIS — J209 Acute bronchitis, unspecified: Secondary | ICD-10-CM

## 2014-02-27 DIAGNOSIS — I5022 Chronic systolic (congestive) heart failure: Secondary | ICD-10-CM

## 2014-02-27 MED ORDER — LEVOFLOXACIN 500 MG PO TABS: 500.0000 mg | ORAL_TABLET | Freq: Every day | ORAL | Status: DC

## 2014-02-27 NOTE — Assessment & Plan Note (Signed)
She appears to be euvolemic. The dose of furosemide was decreased last month to 20 mg once daily due to worsening renal function.

## 2014-02-27 NOTE — Assessment & Plan Note (Signed)
She reports cough productive of greenish sputum. I gave her 5 days of Levaquin 500 mg once daily. QT interval is normal on EKG.

## 2014-02-27 NOTE — Progress Notes (Signed)
HPI  This is a 81 year old female who is here today for a followup visit regarding coronary artery disease, paroxysmal atrial fibrillation and chronic systolic heart failure.   She has extensive cardiac history which include coronary artery disease status post CABG in 1988. She had multiple PCI done on the SVGs. Most recent cardiac catheterization in November 2015 showed occluded native vessels with patent LIMA to LAD and SVG to OM. There was no patent graft to the RCA which was filling via left-to-right collaterals. There was diffuse disease in the distal LAD beyond the anastomosis. Ejection fraction was 45%. There was no significant change from previous cardiac catheterization in 2012. She also has extensive peripheral arterial disease which is being managed by Dr. [name].  She has been doing reasonably well from a cardiac standpoint. She has extensive hip pain and has been requiring steroids injections. Dr. Ernest Pine requested preoperative cardiovascular evaluation for possible left total hip arthroplasty. She has been mostly wheelchair bound. She denies chest discomfort or worsening dyspnea.  Allergies  Allergen Reactions  . Contrast Media [Iodinated Diagnostic Agents]   . Penicillins   . Valium [Diazepam]      Current Outpatient Prescriptions on File Prior to Visit  Medication Sig Dispense Refill  . amiodarone (PACERONE) 200 MG tablet Take 1 tablet (200 mg total) by mouth daily. 90 tablet 3  . apixaban (ELIQUIS) 2.5 MG TABS tablet Take 2.5 mg by mouth 2 (two) times daily.    . benazepril (LOTENSIN) 20 MG tablet TAKE 1 TABLET (20 MG TOTAL) BY MOUTH DAILY. 30 tablet 5  . carvedilol (COREG) 3.125 MG tablet Take 1 tablet (3.125 mg total) by mouth 2 (two) times daily. 180 tablet 3  . furosemide (LASIX) 20 MG tablet Take 1 tablet (20 mg total) by mouth daily. 90 tablet 3  . gabapentin (NEURONTIN) 400 MG capsule Take 400 mg by mouth 3 (three) times daily.    . insulin glargine (LANTUS) 100  UNIT/ML injection Inject 30 Units into the skin at bedtime.    . insulin lispro (HUMALOG) 100 UNIT/ML injection Inject 10 Units into the skin 3 (three) times daily before meals.     . isosorbide mononitrate (IMDUR) 60 MG 24 hr tablet Take 1.5 mg by mouth daily.    [name] levothyroxine (SYNTHROID, LEVOTHROID) 100 MCG tablet Take 100 mcg by mouth daily before breakfast.    . potassium chloride (K-DUR) 10 MEQ tablet Take 10 mEq by mouth daily.     . pravastatin (PRAVACHOL) 40 MG tablet Take 40 mg by mouth daily.     No current facility-administered medications on file prior to visit.     Past Medical History  Diagnosis Date  . DM2 (diabetes mellitus, type 2)     onset age 53 insulin dependent  . Hyperlipidemia   . COPD (chronic obstructive pulmonary disease)   . Peripheral vascular disease   . CAD (coronary artery disease)     a. MI 1988 s/p CABG in 1988, multiple PCI on SVGs; b. cath 2012: occluded native coronary arteries, patent LIMA to LAD (distal LAD dz), patent SVG-OM & occluded SVG-RCA which filled via left to right collats; c. cath 12/2013 patent LIMA-LAD & SVG-O. known occluded VG-RCA w/ L-R collats, no change since cath 2012  . Chronic systolic CHF (congestive heart failure)     a. 03/2012: EF 40-45%, mild lateral wall HK, mod inf HK, mod post wall HK, mild LVH, mild MR/TR   . Paroxysmal atrial fibrillation  a. CHADSVASc 7: yearly risk of CVA 9.6%;. b. on Eliquis 2.5 mg bid (age, SCr, borderline wt 62.9 Kg)   . HTN (hypertension)   . Yeast infection   . UTI (urinary tract infection)      Past Surgical History  Procedure Laterality Date  . Abdominal hysterectomy    . Coronary artery bypass graft    . Appendectomy    . Cholecystectomy    . Ovary surgery    . Angioplasty / stenting femoral      Bilateral SFA stenting  . Femur surgery    . Cardiac catheterization      [facility]  . Cardiac catheterization  12/2013     Family History  Problem Relation Age of Onset  . Diabetes  Mother   . Heart disease Mother   . Hypertension Mother   . Heart disease Father   . Heart disease Brother      History   Social History  . Marital Status: Married    Spouse Name: N/A    Number of Children: N/A  . Years of Education: N/A   Occupational History  . Not on file.   Social History Main Topics  . Smoking status: Former Smoker -- 15 years    Types: Cigarettes    Quit date: 02/18/1988  . Smokeless tobacco: Never Used  . Alcohol Use: No  . Drug Use: No  . Sexual Activity: Not on file   Other Topics Concern  . Not on file   Social History Narrative     PHYSICAL EXAM   BP 108/48 mmHg  Pulse 67  Ht 5' 1.5" (1.562 m)  Wt 138 lb (62.596 kg)  BMI 25.66 kg/m2 Constitutional: She is oriented to person, place, and time. She appears well-developed and well-nourished. No distress.  HENT: No nasal discharge.  Head: Normocephalic and atraumatic.  Eyes: Pupils are equal and round. Right eye exhibits no discharge. Left eye exhibits no discharge.  Neck: Normal range of motion. Neck supple. No JVD present. No thyromegaly present.  Cardiovascular: Normal rate, regular rhythm, normal heart sounds. Exam reveals no gallop and no friction rub. There is a 1/6 systolic ejection murmur at the aortic area Pulmonary/Chest: Effort normal and breath sounds normal. No stridor. No respiratory distress. She has no wheezes. Few bibasilar crackles. She exhibits no tenderness.  Abdominal: Soft. Bowel sounds are normal. She exhibits no distension. There is no tenderness. There is no rebound and no guarding.  Musculoskeletal: Normal range of motion. She exhibits +2 edema and no tenderness.  Neurological: She is alert and oriented to person, place, and time. Coordination normal.  Skin: Skin is warm and dry. No rash noted. She is not diaphoretic. No erythema. No pallor.  Psychiatric: She has a normal mood and affect. Her behavior is normal. Judgment and thought content normal.     EKG:  Sinus  Rhythm  -Incomplete right bundle branch block and left axis -anterior fascicular block.   ABNORMAL     ASSESSMENT AND PLAN

## 2014-02-27 NOTE — Assessment & Plan Note (Signed)
She has no significant angina. Continue medical therapy.

## 2014-02-27 NOTE — Assessment & Plan Note (Signed)
The patient has known history of coronary artery disease. Functional capacity is currently very limited. Most recent cardiac catheterization in November showed patent grafts except the SVG to RCA which is known to be chronically occluded. Ejection fraction was mildly reduced. The patient is thus considered at moderate risk for cardiovascular complications. No further testing is recommended at the present time. Eliquis can be held 2 days before the surgery if needed and should be resumed after.

## 2014-02-27 NOTE — Telephone Encounter (Signed)
[facility] calling stating that we sent in a refill with a drug interaction. Please call  [phone] Misty StanleyLisa

## 2014-02-27 NOTE — Assessment & Plan Note (Signed)
She is currently in normal sinus rhythm and tolerating anticoagulation with Eliquis.

## 2014-02-27 NOTE — Patient Instructions (Addendum)
Start Levaquin 500 mg once daily for 5 days  Continue other medications.   Follow up in 3 months.

## 2014-02-28 NOTE — Telephone Encounter (Signed)
Please advise if patient can take the amiodarone and Levaquin because the pharmacist states there is a drug interaction when taking the two together. Please advise.

## 2014-03-01 ENCOUNTER — Telehealth: Payer: Self-pay

## 2014-03-01 NOTE — Telephone Encounter (Signed)
error 

## 2014-03-01 NOTE — Telephone Encounter (Signed)
Informed pharmacy per verbal from Dr. Kirke CorinArida  He is aware of the interaction between two medications and stated he approves of the patient taking the two medications  Misty StanleyLisa verbalized understanding

## 2014-04-11 ENCOUNTER — Ambulatory Visit: Payer: Self-pay | Admitting: General Practice

## 2014-04-11 ENCOUNTER — Telehealth: Payer: Self-pay | Admitting: *Deleted

## 2014-04-11 DIAGNOSIS — M169 Osteoarthritis of hip, unspecified: Secondary | ICD-10-CM | POA: Insufficient documentation

## 2014-04-11 NOTE — Telephone Encounter (Signed)
Please copy from my note on 02/27/2014.

## 2014-04-11 NOTE — Telephone Encounter (Signed)
Pre-admit calling needing clearance and ekg on patient.   Request for surgical clearance:  1. What type of surgery is being performed?Hip replacement  2. When is this surgery scheduled? 04/24/14  3. Are there any medications that need to be held prior to surgery and how long? 48 hour needs to stop eliquis  4. Name of physician performing surgery? Dr. Ernest PineHooten  5. What is your office phone and fax number? Fax [phone]  6.

## 2014-04-13 ENCOUNTER — Encounter: Payer: Self-pay | Admitting: *Deleted

## 2014-04-13 NOTE — Telephone Encounter (Signed)
Letter faxed as requested

## 2014-04-13 NOTE — Telephone Encounter (Signed)
[name] called, just checking on the status. Stated to her, we are working on it.  She was okay with this.

## 2014-04-21 ENCOUNTER — Emergency Department: Payer: Self-pay | Admitting: Emergency Medicine

## 2014-04-24 ENCOUNTER — Ambulatory Visit: Admit: 2014-04-24 | Disposition: A | Discharge status: Home / Self Care | Payer: Self-pay | Admitting: General Practice

## 2014-04-24 ENCOUNTER — Telehealth: Payer: Self-pay | Admitting: *Deleted

## 2014-04-24 NOTE — Telephone Encounter (Signed)
Okay pt is coming tmrw at 3:15pm to see you. °

## 2014-04-24 NOTE — Telephone Encounter (Signed)
Okay pt is coming tmrw at 3:15pm to see you.

## 2014-04-24 NOTE — Telephone Encounter (Signed)
Same day surgery called asking if doctor Kirke Corinarida could call.  Pt came in today for hip surgery and had some changes in EKG and today she has "low Saturation" so they rescheduled the sugery.  Anesthesiologist is asking if doctor Kirke Corinarida could call him back, he wanted to speak to him about this.

## 2014-04-24 NOTE — Telephone Encounter (Signed)
I spoke with the anesthesiologist. Schedule the patient for a follow-up with me at first available.

## 2014-04-24 NOTE — Telephone Encounter (Signed)
Calling to give right number [phone]

## 2014-04-25 ENCOUNTER — Encounter: Payer: Self-pay | Admitting: Cardiovascular Disease

## 2014-04-25 ENCOUNTER — Ambulatory Visit (INDEPENDENT_AMBULATORY_CARE_PROVIDER_SITE_OTHER): Payer: Medicare Other | Admitting: Cardiovascular Disease

## 2014-04-25 VITALS — BP 119/56 | HR 69 | Ht 61.0 in | Wt 139.0 lb

## 2014-04-25 DIAGNOSIS — I10 Essential (primary) hypertension: Secondary | ICD-10-CM

## 2014-04-25 DIAGNOSIS — I480 Paroxysmal atrial fibrillation: Secondary | ICD-10-CM

## 2014-04-25 DIAGNOSIS — Z01818 Encounter for other preprocedural examination: Secondary | ICD-10-CM | POA: Diagnosis not present

## 2014-04-25 DIAGNOSIS — I2510 Atherosclerotic heart disease of native coronary artery without angina pectoris: Secondary | ICD-10-CM

## 2014-04-25 DIAGNOSIS — I5022 Chronic systolic (congestive) heart failure: Secondary | ICD-10-CM

## 2014-04-25 MED ORDER — FUROSEMIDE 40 MG PO TABS: 40.0000 mg | ORAL_TABLET | Freq: Every day | ORAL | Status: DC

## 2014-04-25 NOTE — Progress Notes (Signed)
HPI  This is a 81 year old female who is here today for a followup visit regarding coronary artery disease, paroxysmal atrial fibrillation and chronic systolic heart failure.   She has extensive cardiac history which include coronary artery disease status post CABG in 1988. She had multiple PCI done on the SVGs. Most recent cardiac catheterization in November 2015 showed occluded native vessels with patent LIMA to LAD and SVG to OM. There was no patent graft to the RCA which was filling via left-to-right collaterals. There was diffuse disease in the distal LAD beyond the anastomosis. Ejection fraction was 45%. There was no significant change from previous cardiac catheterization in 2012. She also has extensive peripheral arterial disease which is being managed by Dr. [name].  She was supposed to get left hip surgery by Dr. Ernest Pine recently. However, on the day of surgery she was noted to be slightly hypoxic and was not feeling well. The surgery was canceled. The patient went to the emergency room on Friday for shortness of breath. She was given additional Lasix. Chest x-ray showed trace pleural effusions. Cardiac enzymes were negative. She has been having some episodes of vomiting and diarrhea.  Allergies  Allergen Reactions  . Contrast Media [Iodinated Diagnostic Agents]   . Penicillins   . Tramadol     Nausea   . Valium [Diazepam]      Current Outpatient Prescriptions on File Prior to Visit  Medication Sig Dispense Refill  . amiodarone (PACERONE) 200 MG tablet Take 1 tablet (200 mg total) by mouth daily. 90 tablet 3  . apixaban (ELIQUIS) 2.5 MG TABS tablet Take 2.5 mg by mouth 2 (two) times daily.    . benazepril (LOTENSIN) 20 MG tablet TAKE 1 TABLET (20 MG TOTAL) BY MOUTH DAILY. 30 tablet 5  . carvedilol (COREG) 3.125 MG tablet Take 1 tablet (3.125 mg total) by mouth 2 (two) times daily. 180 tablet 3  . furosemide (LASIX) 20 MG tablet Take 1 tablet (20 mg total) by mouth daily. 90  tablet 3  . gabapentin (NEURONTIN) 400 MG capsule Take 400 mg by mouth 3 (three) times daily.    . insulin glargine (LANTUS) 100 UNIT/ML injection Inject 30 Units into the skin at bedtime.    . insulin lispro (HUMALOG) 100 UNIT/ML injection Inject 10 Units into the skin 3 (three) times daily before meals.     . isosorbide mononitrate (IMDUR) 60 MG 24 hr tablet Take 1.5 mg by mouth daily.    [name] levothyroxine (SYNTHROID, LEVOTHROID) 100 MCG tablet Take 100 mcg by mouth daily before breakfast.    . potassium chloride (K-DUR) 10 MEQ tablet Take 10 mEq by mouth daily.     . pravastatin (PRAVACHOL) 40 MG tablet Take 40 mg by mouth daily.    . traMADol (ULTRAM) 50 MG tablet Take by mouth as needed.     No current facility-administered medications on file prior to visit.     Past Medical History  Diagnosis Date  . DM2 (diabetes mellitus, type 2)     onset age 32 insulin dependent  . Hyperlipidemia   . COPD (chronic obstructive pulmonary disease)   . Peripheral vascular disease   . CAD (coronary artery disease)     a. MI 1988 s/p CABG in 1988, multiple PCI on SVGs; b. cath 2012: occluded native coronary arteries, patent LIMA to LAD (distal LAD dz), patent SVG-OM & occluded SVG-RCA which filled via left to right collats; c. cath 12/2013 patent LIMA-LAD & SVG-O. known  occluded VG-RCA w/ L-R collats, no change since cath 2012  . Chronic systolic CHF (congestive heart failure)     a. 03/2012: EF 40-45%, mild lateral wall HK, mod inf HK, mod post wall HK, mild LVH, mild MR/TR   . Paroxysmal atrial fibrillation     a. CHADSVASc 7: yearly risk of CVA 9.6%;. b. on Eliquis 2.5 mg bid (age, SCr, borderline wt 62.9 Kg)   . HTN (hypertension)   . Yeast infection   . UTI (urinary tract infection)      Past Surgical History  Procedure Laterality Date  . Abdominal hysterectomy    . Coronary artery bypass graft    . Appendectomy    . Cholecystectomy    . Ovary surgery    . Angioplasty / stenting femoral       Bilateral SFA stenting  . Femur surgery    . Cardiac catheterization      mc  . Cardiac catheterization  12/2013     Family History  Problem Relation Age of Onset  . Diabetes Mother   . Heart disease Mother   . Hypertension Mother   . Heart disease Father   . Heart disease Brother      History   Social History  . Marital Status: Married    Spouse Name: N/A  . Number of Children: N/A  . Years of Education: N/A   Occupational History  . Not on file.   Social History Main Topics  . Smoking status: Former Smoker -- 15 years    Types: Cigarettes    Quit date: 02/18/1988  . Smokeless tobacco: Never Used  . Alcohol Use: No  . Drug Use: No  . Sexual Activity: Not on file   Other Topics Concern  . Not on file   Social History Narrative     PHYSICAL EXAM   BP 119/56 mmHg  Pulse 69  Ht 5\' 1"  (1.549 m)  Wt 139 lb (63.05 kg)  BMI 26.28 kg/m2 Constitutional: She is oriented to person, place, and time. She appears well-developed and well-nourished. No distress.  HENT: No nasal discharge.  Head: Normocephalic and atraumatic.  Eyes: Pupils are equal and round. Right eye exhibits no discharge. Left eye exhibits no discharge.  Neck: Normal range of motion. Neck supple. Mild JVD present. No thyromegaly present.  Cardiovascular: Normal rate, regular rhythm, normal heart sounds. Exam reveals no gallop and no friction rub. There is a 1/6 systolic ejection murmur at the aortic area Pulmonary/Chest: Effort normal and breath sounds normal. No stridor. No respiratory distress. She has no wheezes. Few bibasilar crackles. She exhibits no tenderness.  Abdominal: Soft. Bowel sounds are normal. She exhibits no distension. There is no tenderness. There is no rebound and no guarding.  Musculoskeletal: Normal range of motion. She exhibits +2 edema and no tenderness.  Neurological: She is alert and oriented to person, place, and time. Coordination normal.  Skin: Skin is warm and dry.  No rash noted. She is not diaphoretic. No erythema. No pallor.  Psychiatric: She has a normal mood and affect. Her behavior is normal. Judgment and thought content normal.     ZOX:WRUEAEKG:Sinus  Rhythm  -Right bundle branch block with left axis -bifascicular block.   -  Nonspecific T-abnormality.   ABNORMAL     ASSESSMENT AND PLAN

## 2014-04-25 NOTE — Patient Instructions (Signed)
Your physician has recommended you make the following change in your medication:  Increase Lasix to 40 mg once daily   Your physician recommends that you schedule a follow-up appointment in:  2 weeks

## 2014-04-27 ENCOUNTER — Ambulatory Visit: Payer: Medicare Other | Admitting: Cardiovascular Disease

## 2014-04-28 ENCOUNTER — Telehealth: Payer: Self-pay

## 2014-04-28 NOTE — Telephone Encounter (Signed)
Nurse calling states pt legs, feet and toes are 3 + pitting edema ,nausea, vomiting, malaise.please call

## 2014-04-28 NOTE — Telephone Encounter (Signed)
Patient stated even after her Lasix adjustment she is still swollen and the fluid is not going anywhere  She is still having the nausea vomiting and fatigue  Informed patient that I would inform Dr. Kirke CorinArida   Dr. Kirke CorinArida instructed her to continue her increased dose of lasix over the weekend  Go to the ED if symptoms worsen   Patient verbalized understanding   Informed patients nurse

## 2014-04-30 ENCOUNTER — Encounter: Payer: Self-pay | Admitting: Cardiovascular Disease

## 2014-04-30 NOTE — Assessment & Plan Note (Signed)
Blood pressure is well controlled on current medications. 

## 2014-04-30 NOTE — Assessment & Plan Note (Signed)
The patient appears to be mildly fluid overloaded. Thus, I recommend increasing the dose of furosemide to 40 mg once daily. Follow-up in 2 weeks with basic metabolic profile to be done during that visit. I instructed her to go to the emergency room if symptoms worsen. She is having symptoms of vomiting and diarrhea which complicate her management. Oxygen saturation is 95% on room air today.

## 2014-04-30 NOTE — Assessment & Plan Note (Signed)
She has no symptoms of angina. Continue medical therapy. 

## 2014-04-30 NOTE — Assessment & Plan Note (Signed)
She is currently in normal sinus rhythm and tolerating anticoagulation with Eliquis.

## 2014-05-08 ENCOUNTER — Ambulatory Visit (INDEPENDENT_AMBULATORY_CARE_PROVIDER_SITE_OTHER): Payer: Medicare Other | Admitting: Cardiovascular Disease

## 2014-05-08 ENCOUNTER — Encounter: Payer: Self-pay | Admitting: Cardiovascular Disease

## 2014-05-08 ENCOUNTER — Telehealth: Payer: Self-pay | Admitting: Cardiovascular Disease

## 2014-05-08 VITALS — BP 122/40 | HR 70 | Ht 61.0 in | Wt 139.0 lb

## 2014-05-08 DIAGNOSIS — E785 Hyperlipidemia, unspecified: Secondary | ICD-10-CM

## 2014-05-08 DIAGNOSIS — I2510 Atherosclerotic heart disease of native coronary artery without angina pectoris: Secondary | ICD-10-CM

## 2014-05-08 DIAGNOSIS — Z01810 Encounter for preprocedural cardiovascular examination: Secondary | ICD-10-CM

## 2014-05-08 DIAGNOSIS — E1159 Type 2 diabetes mellitus with other circulatory complications: Secondary | ICD-10-CM

## 2014-05-08 DIAGNOSIS — I5022 Chronic systolic (congestive) heart failure: Secondary | ICD-10-CM

## 2014-05-08 DIAGNOSIS — I480 Paroxysmal atrial fibrillation: Secondary | ICD-10-CM

## 2014-05-08 DIAGNOSIS — I10 Essential (primary) hypertension: Secondary | ICD-10-CM

## 2014-05-08 MED ORDER — FUROSEMIDE 40 MG PO TABS: 40.0000 mg | ORAL_TABLET | Freq: Two times a day (BID) | ORAL | Status: DC | PRN

## 2014-05-08 MED ORDER — APIXABAN 2.5 MG PO TABS: 2.5000 mg | ORAL_TABLET | Freq: Two times a day (BID) | ORAL | Status: AC

## 2014-05-08 NOTE — Assessment & Plan Note (Signed)
Recommend close follow-up with Dr. Juanetta GoslingHawkins. We have encouraged continued exercise, careful diet management in an effort to lose weight.

## 2014-05-08 NOTE — Assessment & Plan Note (Signed)
Chronic diastolic and systolic CHF Weight unchanged today though symptomatically feels somewhat better.  I suspect we'll need to run her mildly dry for symptom relief. Creatinine 1.39 in early March 2016. This was on Lasix 20 mg daily. Minimal edema on today's visit on Lasix 40 mg daily though she does report mild abdominal fullness, occasional shortness of breath on Fridays every week. She does drink significant fluids daily. We have recommended she stay on Lasix 40 mg daily and take 40 mg after lunch 2 days per week. Prior to her left hip surgery, she may need to 3 days of Lasix 40 twice a day in a row in preparation for gentle IV fluids.

## 2014-05-08 NOTE — Assessment & Plan Note (Signed)
Given the severity of her pain, and as there is no further cardiac testing needed, I feel she is acceptable risk for left hip surgery with Dr. Ernest PineHooten. We will make small adjustments to her Lasix preop in preparation. Suspect we'll need to run her mildly dry for symptom relief. This will likely lead to slight elevation of her creatinine and BUN.  During the perioperative period, and postop, would recommend MIs and IV fluids to avoid CHF exacerbation

## 2014-05-08 NOTE — Telephone Encounter (Signed)
Samples of eliquis given at office visit.

## 2014-05-08 NOTE — Progress Notes (Signed)
Patient ID: [name], female    DOB: [birth year], 81 y.o.   MRN: [id number]  HPI Comments: [name] is a  81 year old female with a history of coronary artery disease, CABG , paroxysmal atrial fibrillation and chronic systolic heart failure, who presents for follow-up of her CHF symptoms On her last clinic with Dr. Kirke CorinArida March 9 her Lasix was increased from 20 up to 40 mg daily.  She reports that she drink significant fluids in the daytime because she wants to hydrate her kidneys. She does not weigh herself as she is unable to stand.  Her last dry weight by her report was 135 pounds. Weight in the clinic today as well as 2 weeks ago was 139 pounds, unchanged She continues to have trace leg edema bilaterally around the ankles, very slight abdominal swelling. She denies significant shortness of breath symptoms though she and her daughter agree that for some reason on Fridays in general she seems to have slightly more shortness of breath. She would like to have hip surgery ASAP as she has severe pain. She reports that she almost cries whenever she moves her hip  She did go to the emergency room 04/21/2014, creatinine 1.39, BUN 22, glucose 243, normal LFTs, hematocrit 31, urine with Proteus Mirabella's Chest x-ray showing interstitial coarsening, trace bilateral pleural effusion EKG emergency room 04/21/2014 showing normal sinus rhythm, rate 78 bpm, right bundle branch block EKG on today's visit shows normal sinus rhythm with rate 70 bpm, right bundle branch block  Other past medical history coronary artery disease status post CABG in 1988.  multiple PCI done on the SVGs.  Most recent cardiac catheterization in November 2015 showed occluded native vessels with patent LIMA to LAD and SVG to OM. There was no patent graft to the RCA which was filling via left-to-right collaterals. There was diffuse disease in the distal LAD beyond the anastomosis. Ejection fraction was 45%. There was  no significant change from previous cardiac catheterization in 2012.   extensive peripheral arterial disease which is being managed by Dr. Edilia Boickson.   She was supposed to get left hip surgery by Dr. Ernest PineHooten recently. However, on the day of surgery she was noted to be slightly hypoxic and was not feeling well. The surgery was canceled.  Notes indicate previous episodes of nausea, vomiting, diarrhea   Allergies  Allergen Reactions  . Contrast Media [Iodinated Diagnostic Agents]   . Penicillins   . Tramadol     Nausea   . Valium [Diazepam]     Outpatient Encounter Prescriptions as of 05/08/2014  Medication Sig  . amiodarone (PACERONE) 200 MG tablet Take 1 tablet (200 mg total) by mouth daily.  [name]. apixaban (ELIQUIS) 2.5 MG TABS tablet Take 1 tablet (2.5 mg total) by mouth 2 (two) times daily.  . benazepril (LOTENSIN) 20 MG tablet TAKE 1 TABLET (20 MG TOTAL) BY MOUTH DAILY.  . carvedilol (COREG) 3.125 MG tablet Take 1 tablet (3.125 mg total) by mouth 2 (two) times daily.  . furosemide (LASIX) 40 MG tablet Take 1 tablet (40 mg total) by mouth 2 (two) times daily as needed.  . gabapentin (NEURONTIN) 400 MG capsule Take 400 mg by mouth 3 (three) times daily.  [name]. HYDROcodone-acetaminophen (NORCO/VICODIN) 5-325 MG per tablet Take 2 tablets by mouth every 4 (four) hours as needed for moderate pain.  [name]. insulin glargine (LANTUS) 100 UNIT/ML injection Inject 30 Units into the skin at bedtime.  . insulin lispro (HUMALOG) 100 UNIT/ML injection Inject  10 Units into the skin 3 (three) times daily before meals.   . isosorbide mononitrate (IMDUR) 60 MG 24 hr tablet Take 1.5 mg by mouth daily.  [name] levothyroxine (SYNTHROID, LEVOTHROID) 100 MCG tablet Take 100 mcg by mouth daily before breakfast.  . nitrofurantoin, macrocrystal-monohydrate, (MACROBID) 100 MG capsule Take 100 mg by mouth 2 (two) times daily.  . potassium chloride (K-DUR) 10 MEQ tablet Take 10 mEq by mouth daily.   . pravastatin (PRAVACHOL) 40 MG  tablet Take 40 mg by mouth daily.  . traMADol (ULTRAM) 50 MG tablet Take by mouth as needed.  . [DISCONTINUED] apixaban (ELIQUIS) 2.5 MG TABS tablet Take 2.5 mg by mouth 2 (two) times daily.  . [DISCONTINUED] furosemide (LASIX) 40 MG tablet Take 1 tablet (40 mg total) by mouth daily.    Past Medical History  Diagnosis Date  . DM2 (diabetes mellitus, type 2)     onset age 40 insulin dependent  . Hyperlipidemia   . COPD (chronic obstructive pulmonary disease)   . Peripheral vascular disease   . CAD (coronary artery disease)     a. MI 1988 s/p CABG in 1988, multiple PCI on SVGs; b. cath 2012: occluded native coronary arteries, patent LIMA to LAD (distal LAD dz), patent SVG-OM & occluded SVG-RCA which filled via left to right collats; c. cath 12/2013 patent LIMA-LAD & SVG-O. known occluded VG-RCA w/ L-R collats, no change since cath 2012  . Chronic systolic CHF (congestive heart failure)     a. 03/2012: EF 40-45%, mild lateral wall HK, mod inf HK, mod post wall HK, mild LVH, mild MR/TR   . Paroxysmal atrial fibrillation     a. CHADSVASc 7: yearly risk of CVA 9.6%;. b. on Eliquis 2.5 mg bid (age, SCr, borderline wt 62.9 Kg)   . HTN (hypertension)   . Yeast infection   . UTI (urinary tract infection)     Past Surgical History  Procedure Laterality Date  . Abdominal hysterectomy    . Coronary artery bypass graft    . Appendectomy    . Cholecystectomy    . Ovary surgery    . Angioplasty / stenting femoral      Bilateral SFA stenting  . Femur surgery    . Cardiac catheterization      mc  . Cardiac catheterization  12/2013    Social History  reports that she quit smoking about 26 years ago. Her smoking use included Cigarettes. She quit after 15 years of use. She has never used smokeless tobacco. She reports that she does not drink alcohol or use illicit drugs.  Family History family history includes Diabetes in her mother; Heart disease in her brother, father, and mother; Hypertension  in her mother.      Review of Systems  Constitutional: Negative.   HENT: Negative.   Eyes: Negative.   Respiratory: Positive for shortness of breath.   Cardiovascular: Positive for leg swelling.  Gastrointestinal: Negative.   Endocrine: Negative.   Musculoskeletal: Negative.   Skin: Negative.   Allergic/Immunologic: Negative.   Neurological: Negative.   Hematological: Negative.   Psychiatric/Behavioral: Negative.   All other systems reviewed and are negative.   BP 122/40 mmHg  Pulse 70  Ht  (1.549 m)  Wt 139 lb (63.05 kg)  BMI 26.28 kg/m2  SpO2 96%   Physical Exam  Constitutional: She is oriented to person, place, and time. She appears well-developed and well-nourished.  Presenting in a wheelchair  HENT:  Head: Normocephalic.  Nose:  Nose normal.  Mouth/Throat: Oropharynx is clear and moist.  Eyes: Conjunctivae are normal. Pupils are equal, round, and reactive to light.  Neck: Normal range of motion. Neck supple. No JVD present.  Cardiovascular: Normal rate, regular rhythm, S1 normal, S2 normal, normal heart sounds and intact distal pulses.  Exam reveals no gallop and no friction rub.   No murmur heard. Pulmonary/Chest: Effort normal. No respiratory distress. She has no wheezes. She has rales. She exhibits no tenderness.  Abdominal: Soft. Bowel sounds are normal. She exhibits no distension. There is no tenderness.  Musculoskeletal: Normal range of motion. She exhibits no edema or tenderness.  Lymphadenopathy:    She has no cervical adenopathy.  Neurological: She is alert and oriented to person, place, and time. Coordination normal.  Skin: Skin is warm and dry. No rash noted. No erythema.  Psychiatric: She has a normal mood and affect. Her behavior is normal. Judgment and thought content normal.    Assessment and Plan  Nursing note and vitals reviewed.

## 2014-05-08 NOTE — Assessment & Plan Note (Addendum)
Blood pressure is well controlled on today's visit. No changes made to the medications. 

## 2014-05-08 NOTE — Assessment & Plan Note (Signed)
Currently with no symptoms of angina. No further workup at this time. Continue current medication regimen. 

## 2014-05-08 NOTE — Patient Instructions (Addendum)
You are doing well.  You have a slight amount of fluid in her ankles, Please take lasix 40 mg daily,  Also take lasix 40 mg in the afternoon two days a week  Call the office when you know the surgical date  Your physician wants you to follow-up in: 3 weeks with [name]

## 2014-05-08 NOTE — Assessment & Plan Note (Signed)
No recent lipid panel available for our review

## 2014-05-08 NOTE — Assessment & Plan Note (Addendum)
She appears to be maintaining normal sinus rhythm on EKG today and at the beginning of March. She might benefit from further titration of her beta blockers in the future if blood pressure tolerates. We'll arrange follow-up with Dr. Kirke CorinArida her regular cardiologist.

## 2014-05-08 NOTE — Telephone Encounter (Signed)
Patient in office for appt. Needs to get new RX for Eliquis .  Patient will need Samples too.  She is completely out today.

## 2014-05-22 ENCOUNTER — Ambulatory Visit
Admit: 2014-05-22 | Disposition: A | Discharge status: Home / Self Care | Payer: Self-pay | Attending: General Practice | Admitting: General Practice

## 2014-05-22 LAB — URINALYSIS, COMPLETE
Bilirubin,UR: NEGATIVE
Glucose,UR: NEGATIVE mg/dL (ref 0–75)
Ketone: NEGATIVE
Nitrite: NEGATIVE
Ph: 7 (ref 4.5–8.0)
Protein: 100
Specific Gravity: 1.01 (ref 1.003–1.030)

## 2014-05-22 LAB — PROTIME-INR
INR: 1.1
Prothrombin Time: 14.2 secs

## 2014-05-22 LAB — CBC
HCT: 33.7 % — AB (ref 35.0–47.0)
HGB: 11.2 g/dL — ABNORMAL LOW (ref 12.0–16.0)
MCH: 31.1 pg (ref 26.0–34.0)
MCHC: 33.2 g/dL (ref 32.0–36.0)
MCV: 94 fL (ref 80–100)
Platelet: 309 10*3/uL (ref 150–440)
RBC: 3.6 10*6/uL — AB (ref 3.80–5.20)
RDW: 15.1 % — ABNORMAL HIGH (ref 11.5–14.5)
WBC: 9.2 10*3/uL (ref 3.6–11.0)

## 2014-05-22 LAB — BASIC METABOLIC PANEL
ANION GAP: 10 (ref 7–16)
BUN: 36 mg/dL — ABNORMAL HIGH
CALCIUM: 9.4 mg/dL
Chloride: 100 mmol/L — ABNORMAL LOW
Co2: 28 mmol/L
Creatinine: 1.48 mg/dL — ABNORMAL HIGH
EGFR (African American): 38 — ABNORMAL LOW
EGFR (Non-African Amer.): 33 — ABNORMAL LOW
GLUCOSE: 168 mg/dL — AB
Potassium: 4.1 mmol/L
SODIUM: 138 mmol/L

## 2014-05-22 LAB — SEDIMENTATION RATE: Erythrocyte Sed Rate: 79 mm/hr — ABNORMAL HIGH (ref 0–30)

## 2014-05-22 LAB — HEMOGLOBIN A1C: HEMOGLOBIN A1C: 7.4 % — AB

## 2014-05-22 LAB — APTT: Activated PTT: 32.4 secs (ref 23.6–35.9)

## 2014-05-22 LAB — MRSA PCR SCREENING

## 2014-05-24 ENCOUNTER — Telehealth: Payer: Self-pay

## 2014-05-24 LAB — URINE CULTURE

## 2014-05-24 NOTE — Telephone Encounter (Signed)
Spoke w/ Victorino DikeJennifer.  She states that pt is sched to see Dr. Kirke CorinArida tomorrow at 11:30 for cardiac clearance for surgery, but this appt is after appt w/ Dr. Ernest PineHooten.  They told her that pt's surgery may be cancelled due to elevated BUN & Creatinine. Advised her that Dr. Windell HummingbirdGollan's last office note indicated that this was intentional, as they are trying to "dry her out" prior to the IV fluids of surgery.  Moved pt's appt to 10:45 in hopes this will help accommodate her schedule better. She called back as asked for appt to be moved back to 11:30, as pt's appt w/ Dr. Ernest PineHooten is at 11:00.

## 2014-05-24 NOTE — Telephone Encounter (Signed)
Left message for [name] to call back

## 2014-05-24 NOTE — Telephone Encounter (Signed)
Pt grandaughter called would like to discuss pt labwork from pre op. Pt has an appt tomorrow, and wants to speak with a nurse before pt comes in.

## 2014-05-24 NOTE — Telephone Encounter (Signed)
Coralee RudSabrina F Kamree Wiens at 05/24/2014 2:53 PM     Status: Signed       Expand All Collapse All   Pt grandaughter called would like to discuss pt labwork from pre op. Pt has an appt tomorrow, and wants to speak with a nurse before pt comes in.

## 2014-05-25 ENCOUNTER — Encounter: Payer: Self-pay | Admitting: Cardiovascular Disease

## 2014-05-25 ENCOUNTER — Ambulatory Visit: Payer: Medicare Other | Admitting: Cardiovascular Disease

## 2014-05-25 ENCOUNTER — Ambulatory Visit (INDEPENDENT_AMBULATORY_CARE_PROVIDER_SITE_OTHER): Payer: Medicare Other | Admitting: Cardiovascular Disease

## 2014-05-25 ENCOUNTER — Other Ambulatory Visit: Payer: Self-pay

## 2014-05-25 ENCOUNTER — Other Ambulatory Visit
Admit: 2014-05-25 | Disposition: A | Discharge status: Home / Self Care | Payer: Self-pay | Attending: Cardiovascular Disease | Admitting: Cardiovascular Disease

## 2014-05-25 VITALS — BP 146/50 | HR 64 | Ht 61.0 in | Wt 140.0 lb

## 2014-05-25 DIAGNOSIS — I2510 Atherosclerotic heart disease of native coronary artery without angina pectoris: Secondary | ICD-10-CM | POA: Diagnosis not present

## 2014-05-25 DIAGNOSIS — I480 Paroxysmal atrial fibrillation: Secondary | ICD-10-CM | POA: Diagnosis not present

## 2014-05-25 DIAGNOSIS — I5022 Chronic systolic (congestive) heart failure: Secondary | ICD-10-CM | POA: Diagnosis not present

## 2014-05-25 LAB — BASIC METABOLIC PANEL
Anion Gap: 10 (ref 7–16)
BUN: 33 mg/dL — ABNORMAL HIGH
CALCIUM: 8.9 mg/dL
CO2: 26 mmol/L
Chloride: 103 mmol/L
Creatinine: 1.63 mg/dL — ABNORMAL HIGH
EGFR (African American): 34 — ABNORMAL LOW
EGFR (Non-African Amer.): 29 — ABNORMAL LOW
Glucose: 139 mg/dL — ABNORMAL HIGH
POTASSIUM: 4.1 mmol/L
Sodium: 139 mmol/L

## 2014-05-25 MED ORDER — FUROSEMIDE 40 MG PO TABS: 40.0000 mg | ORAL_TABLET | Freq: Every day | ORAL | Status: DC

## 2014-05-25 NOTE — Patient Instructions (Signed)
Labs today.   Change lasix to 40 mg once daily.   Follow up in 2 months.

## 2014-05-25 NOTE — Assessment & Plan Note (Signed)
Fluid overload improved significantly after adjusting the dose of furosemide. She had recent labs done 3 days ago which showed a creatinine of 1.48 and BUN of 36. Hemoglobin was 11.2. Oxygen saturation today is 98% on room air. we can go back to the original dose of furosemide to 40 mg once daily. I'm going to repeat basic metabolic profile today to ensure stable kidney function.

## 2014-05-25 NOTE — Progress Notes (Signed)
Primary care physician: Dr. Juanetta Gosling  HPI  This is a 81 year old female who is here today for a followup visit regarding coronary artery disease, paroxysmal atrial fibrillation and chronic systolic heart failure.   She has extensive cardiac history which include coronary artery disease status post CABG in 1988. She had multiple PCI done on the SVGs. Most recent cardiac catheterization in November 2015 showed occluded native vessels with patent LIMA to LAD and SVG to OM. There was no patent graft to the RCA which was filling via left-to-right collaterals. There was diffuse disease in the distal LAD beyond the anastomosis. Ejection fraction was 45%. There was no significant change from previous cardiac catheterization in 2012. She also has extensive peripheral arterial disease which is being managed by Dr. [name].  She needs to have left hip surgery. This was postponed recently due to symptoms of fluid overload and mild hypoxia. I increased the dose of furosemide to 40 mg once daily. The dose was further increased to 40 mg twice daily twice a week in order to improve fluid overload. She feels overall better but she still significantly bothered by her left hip. She denies any chest pain.  Allergies  Allergen Reactions  . Contrast Media [Iodinated Diagnostic Agents]   . Penicillins   . Tramadol     Nausea   . Valium [Diazepam]      Current Outpatient Prescriptions on File Prior to Visit  Medication Sig Dispense Refill  . amiodarone (PACERONE) 200 MG tablet Take 1 tablet (200 mg total) by mouth daily. 90 tablet 3  . apixaban (ELIQUIS) 2.5 MG TABS tablet Take 1 tablet (2.5 mg total) by mouth 2 (two) times daily. 60 tablet 6  . benazepril (LOTENSIN) 20 MG tablet TAKE 1 TABLET (20 MG TOTAL) BY MOUTH DAILY. 30 tablet 5  . carvedilol (COREG) 3.125 MG tablet Take 1 tablet (3.125 mg total) by mouth 2 (two) times daily. 180 tablet 3  . furosemide (LASIX) 40 MG tablet Take 1 tablet (40 mg total) by  mouth 2 (two) times daily as needed. 60 tablet 6  . gabapentin (NEURONTIN) 400 MG capsule Take 400 mg by mouth 3 (three) times daily.    [name] HYDROcodone-acetaminophen (NORCO/VICODIN) 5-325 MG per tablet Take 2 tablets by mouth every 4 (four) hours as needed for moderate pain.    [name] insulin glargine (LANTUS) 100 UNIT/ML injection Inject 30 Units into the skin at bedtime.    . insulin lispro (HUMALOG) 100 UNIT/ML injection Inject 10 Units into the skin 3 (three) times daily before meals.     . isosorbide mononitrate (IMDUR) 60 MG 24 hr tablet Take 1.5 mg by mouth daily.    [name] levothyroxine (SYNTHROID, LEVOTHROID) 100 MCG tablet Take 100 mcg by mouth daily before breakfast.    . nitrofurantoin, macrocrystal-monohydrate, (MACROBID) 100 MG capsule Take 100 mg by mouth 2 (two) times daily.    . potassium chloride (K-DUR) 10 MEQ tablet Take 10 mEq by mouth daily.     . pravastatin (PRAVACHOL) 40 MG tablet Take 40 mg by mouth daily.    . traMADol (ULTRAM) 50 MG tablet Take by mouth as needed.     No current facility-administered medications on file prior to visit.     Past Medical History  Diagnosis Date  . DM2 (diabetes mellitus, type 2)     onset age 4 insulin dependent  . Hyperlipidemia   . COPD (chronic obstructive pulmonary disease)   . Peripheral vascular disease   . CAD (  coronary artery disease)     a. MI 1988 s/p CABG in 1988, multiple PCI on SVGs; b. cath 2012: occluded native coronary arteries, patent LIMA to LAD (distal LAD dz), patent SVG-OM & occluded SVG-RCA which filled via left to right collats; c. cath 12/2013 patent LIMA-LAD & SVG-O. known occluded VG-RCA w/ L-R collats, no change since cath 2012  . Chronic systolic CHF (congestive heart failure)     a. 03/2012: EF 40-45%, mild lateral wall HK, mod inf HK, mod post wall HK, mild LVH, mild MR/TR   . Paroxysmal atrial fibrillation     a. CHADSVASc 7: yearly risk of CVA 9.6%;. b. on Eliquis 2.5 mg bid (age, SCr, borderline wt 62.9 Kg)    . HTN (hypertension)   . Yeast infection   . UTI (urinary tract infection)      Past Surgical History  Procedure Laterality Date  . Abdominal hysterectomy    . Coronary artery bypass graft    . Appendectomy    . Cholecystectomy    . Ovary surgery    . Angioplasty / stenting femoral      Bilateral SFA stenting  . Femur surgery    . Cardiac catheterization      mc  . Cardiac catheterization  12/2013     Family History  Problem Relation Age of Onset  . Diabetes Mother   . Heart disease Mother   . Hypertension Mother   . Heart disease Father   . Heart disease Brother      History   Social History  . Marital Status: Married    Spouse Name: N/A  . Number of Children: N/A  . Years of Education: N/A   Occupational History  . Not on file.   Social History Main Topics  . Smoking status: Former Smoker -- 15 years    Types: Cigarettes    Quit date: 02/18/1988  . Smokeless tobacco: Never Used  . Alcohol Use: No  . Drug Use: No  . Sexual Activity: Not on file   Other Topics Concern  . Not on file   Social History Narrative     PHYSICAL EXAM   BP 146/50 mmHg  Pulse 64  Ht 5\' 1"  (1.549 m)  Wt 140 lb (63.504 kg)  BMI 26.47 kg/m2 Constitutional: She is oriented to person, place, and time. She appears well-developed and well-nourished. No distress.  HENT: No nasal discharge.  Head: Normocephalic and atraumatic.  Eyes: Pupils are equal and round. Right eye exhibits no discharge. Left eye exhibits no discharge.  Neck: Normal range of motion. Neck supple. Mild JVD present. No thyromegaly present.  Cardiovascular: Normal rate, regular rhythm, normal heart sounds. Exam reveals no gallop and no friction rub. There is a 1/6 systolic ejection murmur at the aortic area Pulmonary/Chest: Effort normal and breath sounds normal. No stridor. No respiratory distress. She has no wheezes. Few bibasilar crackles. She exhibits no tenderness.  Abdominal: Soft. Bowel sounds are  normal. She exhibits no distension. There is no tenderness. There is no rebound and no guarding.  Musculoskeletal: Normal range of motion. She exhibits +2 edema and no tenderness.  Neurological: She is alert and oriented to person, place, and time. Coordination normal.  Skin: Skin is warm and dry. No rash noted. She is not diaphoretic. No erythema. No pallor.  Psychiatric: She has a normal mood and affect. Her behavior is normal. Judgment and thought content normal.     HYQ:MVHQIEKG:Sinus  Rhythm  -Right bundle branch block.  ABNORMAL      ASSESSMENT AND PLAN

## 2014-05-25 NOTE — Assessment & Plan Note (Signed)
She has no symptoms of angina. Continue medical therapy. 

## 2014-05-25 NOTE — Assessment & Plan Note (Signed)
She is maintaining in sinus rhythm on amiodarone. Hold Eliquis 2 days before the surgery which is scheduled on Monday.

## 2014-05-29 ENCOUNTER — Ambulatory Visit
Admit: 2014-05-29 | Disposition: A | Discharge status: Home / Self Care | Payer: Self-pay | Attending: General Practice | Admitting: General Practice

## 2014-05-30 ENCOUNTER — Ambulatory Visit: Payer: Medicare Other | Admitting: Cardiovascular Disease

## 2014-06-09 NOTE — Discharge Summary (Signed)
PATIENT NAME:  [name], [name] MR#:  [id number] DATE OF BIRTH:  08/13/1931  DATE OF ADMISSION:  03/22/2012 DATE OF DISCHARGE:  03/24/2012  ADMITTING PHYSICIAN:  Dr. Laurin Coder.   DISCHARGING PHYSICIAN:  Dr. Gladstone Lighter.   CONSULTATIONS IN THE HOSPITAL:  Cardiology consultation by Dr. Fletcher Anon and Dr. Rockey Situ.   PRIMARY CARE PHYSICIAN:  Dr. Laurian Brim.   DISCHARGE DIAGNOSES: 1.  Unstable angina.  2.  Severe coronary artery disease status post bypass graft surgery.  3.  Chronic systolic congestive heart failure, ejection fraction 40% to 45%.  4.  Hypertension.  5.  Diabetes mellitus.  6.  Hyperlipidemia.  7.  Paroxysmal atrial fibrillation.  8.  Severe peripheral vascular disease.   DISCHARGE HOME MEDICATIONS:  1.  NovoLog 10 units subcutaneously before meals.  2.  Benazepril 10 mg by mouth daily.  3.  Ranexa 500 mg by mouth daily.  4.  Amiodarone 200 mg by mouth twice daily.  5.  Gabapentin 400 mg by mouth 3 times daily.  6.  Metformin 500 mg by mouth twice daily.  7.  Pravastatin 40 mg by mouth daily.  8.  Trilipix 135 mg by mouth daily.  9.  Aspirin 81 mg by mouth daily.  10.  Plavix 75 mg by mouth daily.  11.  Pletal 50 mg by mouth twice daily.  12.  Lasix 40 mg by mouth daily.  13.  Ferrous sulfate 325 mg by mouth daily.  14.  Potassium chloride 10 mEq by mouth daily.  15.  Levothyroxine 100 mcg by mouth daily.  16.  Imdur 30 mg by mouth daily.  17.  Lantus 30 units subQ at bedtime.  18.  Metoprolol 50 mg by mouth twice daily.   DISCHARGE DIET:  Low sodium, ADA diet.   DISCHARGE ACTIVITY:  As tolerated.    FOLLOW-UP INSTRUCTIONS: 1.  PCP follow-up in two weeks.  2.  Cardiology follow-up in two weeks.   LABORATORY AND IMAGING STUDIES DONE:  WBC 7.6, hemoglobin 11.7, hematocrit 35.0, platelet count 213.  Sodium 138, potassium 3.9, chloride 100, bicarb 29, BUN 15, creatinine 1.2, glucose 206 and calcium of 8.8.  Troponins remained negative while in the  hospital.  Chest x-ray on exam showing mild pulmonary vascular congestion and enlargement of cardiac silhouette noted.  ALT 14, AST 20, alk phos is 88, total bili 0.4 and albumin of 3.4.  Echocardiogram showing EF of 45% to 50% with moderate posterior wall hypokinesis and inferior wall hypokinesis.  There is mild lateral wall hypokinesis.  Nuclear Lexiscan Myoview showing a fixed defect in the basal inferior wall, EF calculated to be 40% and apical thinning noted.   BRIEF HOSPITAL COURSE:  The patient is a pleasant 81 year old Caucasian female with significant cardiac history including history of coronary artery disease status post bypass surgery, recent catheterization in 2012 showing diffuse disease not amenable for any intervention, systolic congestive heart failure on medical management along with diabetes and hypertension comes to the hospital secondary to chest pain.  1.  Chest pain, likely unstable angina.  She also has chronic dyspnea on exertion probably related to her cardiac disease.  She had stopped her aspirin and Plavix recently for a possible intra-articular corticosteroid injection for her hip for arthritis and has developed chest pain.  She does have on and off history of chronic chest pain and recent cath did not show any places amenable for intervention.  Her troponins were negative.  She remained chest pain-free for most of the  time and was seen by cardiologist as she wanted to establish care with [facility] in [place] instead of in [place].  She was seen by Dr. Fletcher Anon and Dr. Rockey Situ.  Echo did show some wall hypokinesis and Myoview was ordered and Myoview did not show any reversible ischemia.  She did have some fixed defect and apical thinning, but no active ischemia.  The patient has severe peripheral vascular disease with several stents put in her legs and not a great candidate for cardiac catheterization unless she really has to have one.  Because of no active changes on her  Myoview, her medications were adjusted and she is being discharged home.  Of note, patient also did develop mild chest pain and nausea as a reaction to [place] which improved with rest and pain medications.  2.  Hypertension.  The patient's home list did show a lot of medications at higher doses, however her blood pressure has been on the low-normal side so her medications were tapered to benazepril 10 mg, Lasix 40 mg daily, Imdur 30 mg daily and metoprolol 50 mg twice daily.  3.  Paroxysmal atrial fibrillation, rate was controlled.  Her heart rate was in the 60 range.  She is on metoprolol 50 twice daily and on Pacerone.  Her diltiazem has been stopped.  She is already on aspirin and Plavix for anticoagulation.  4.  Diabetes mellitus.  The patient on Lantus and also pre-meal regular insulin.  Her other home medications were continued.  Her course has been otherwise uneventful in the hospital.   DISCHARGE CONDITION:  Stable.   DISCHARGE DISPOSITION:  Home.   TIME SPENT ON DISCHARGE:  Is 45 minutes.     ____________________________ Gladstone Lighter, MD rk:ea D: 03/24/2012 16:33:04 ET T: 03/25/2012 04:47:41 ET JOB#: [id number]  cc: Gladstone Lighter, MD, <Dictator> Dr. Redmond Baseman., MD Gladstone Lighter MD ELECTRONICALLY SIGNED 03/25/2012 19:50

## 2014-06-09 NOTE — H&P (Signed)
PATIENT NAME:  [name], [name] MR#:  [id number] DATE OF BIRTH:  1932/01/22  DATE OF ADMISSION:  03/22/2012  PRIMARY CARE PHYSICIAN: Marcine Matar., MD   PRIMARY CARDIOLOGIST: At [facility], although the patient has not seen him for over a year.   ORTHOPEDIC DOCTOR:  Myra Rude, MD   CHIEF COMPLAINT: Chest pain and shortness of breath.   HISTORY OF PRESENT ILLNESS: The patient is a very nice 81 year old female who has history of severe coronary artery disease. The patient had coronary artery bypass graft and stent after that. She has had several heart caths and also severe peripheral vascular disease for which the patient had a left lower extremity blockage of 100%, unable to get major procedures done due to her severe coronary artery disease. She also has hypertension, diabetes, atrial fibrillation, CHF, which apparently is diastolic, hyperlipidemia and a history of lymphoma, which has been in recession for over 5 years. The patient has severe osteoarthritis for what she is visited Dr. Katrinka Blazing. Dr. Katrinka Blazing ordered an injection of her hip due to severe pain for which she was told to stop her Plavix and aspirin for 5 days. This is the second day that she is off the medication and around 10:00 a.m. this morning started having significant chest pain, which was located retrosternally, pressure-like, with no radiation of the pain. Pain started at resting, but it got worse after any minor activity the patient was doing. She felt like something was sitting on her chest.  The severity was 9 out of 10. She took a nitroglycerin with relief of the pain slightly. She had 2 nitroglycerins and her pain is starting to go back up to 9 out of 10. Right now, her pain is 6 out of 10. The patient states that she has been having increased shortness of breath as well. Her chest x-ray reads as mild CHF exacerbation. A side note, the patient had a stress test done 12 months ago, which was okay. She has not taken  any of her blood pressure medications or Lasix today. She had increased edema of the legs and she has severe claudication of the legs.   REVIEW OF SYSTEMS:   CONSTITUTIONAL: No fever. No fatigue. The patient has no weakness. No weight loss. No significant weight gain.  EYES: No blurry vision, double vision or cataracts.   ENT: No tinnitus. No difficulty swallowing. No postnasal drip.  RESPIRATORY: No cough. No wheezing. Positive dyspnea, especially with exertion on normal basis, the patient states that is worse today. She cannot walk more than 50 feet without getting severely short of breath.  CARDIOVASCULAR: Positive chest pain. Negative orthopnea. Positive edema. Negative for palpitations. Negative syncope.  GASTROINTESTINAL: No nausea, vomiting, diarrhea. No jaundice. No rectal bleeding. No hematemesis.  GENITOURINARY: No dysuria, hematuria or changes in frequency.  ENDOCRINE: No polyuria, polydipsia, or polyphagia. No cold or heat intolerance.  HEMATOLOGIC/LYMPHATIC: No easy bruising. No bleeding. No swollen glands. Positive history of lymphoma in the past, which has been in remission. Positive severe peripheral vascular disease with claudication.  SKIN: No lesions or rashes.  MUSCULOSKELETAL: Positive hip pain, which is okay now, but has been severe in the past. No gout.  NEUROLOGIC: No CVAs. No TIAs. No numbness or weakness.  PSYCHIATRIC: No insomnia. No anxiety or depression.   PAST MEDICAL HISTORY: 1.  Coronary artery disease status post coronary artery bypass graft and stent put in within the last five years.  2.  Hypertension.  3.  Diabetes.  4.  Atrial fibrillation.  5.  Peripheral vascular disease with claudication.   6.  CHF diastolic is stable.  7.  Hyperlipidemia.  8.  History of lymphoma in remission.   PAST SURGICAL HISTORY:  1.  Coronary artery bypass graft in 1989. Three vessel stent of her heart about 5 years ago.  2.  Heart cath on several occasions after her  coronary artery bypass graft.  3.  Multiple angiograms of the lower extremities. The patient has stents in both lower extremities.  4.  Appendectomy.  5.  Cholecystectomy.  6.  Hysterectomy.  7.  Ovarian cyst removal.  8.  Right total hip and total knee replacement.   SOCIAL HISTORY: The patient used to be a smoker. She quit about 20 years ago. She states that she smoked less than half pack a day for over 30 years. She does not drink. She lives with her husband, her granddaughter and great grandson. Her granddaughter is an LPN.   FAMILY HISTORY: Positive for coronary artery disease, a strong family history with mother, father, brother and grandmother having MIs. Denies any history of cancer.   ALLERGIES: THE PATIENT IS ALLERGIC TO IODINE, IV DYE, PENICILLIN AND VALIUM.  CURRENT MEDICATIONS: Include Trilipix 135 mg once daily, Ranexa 500 mg once a day, pravastatin 40 mg once daily, potassium chloride once daily 10 mEq,  Pletal 50 mg twice daily, Plavix 75 mg once daily, Pacerone 200 mg two times a day, NovoLog 10 units subcutaneous before meals, metoprolol 75 mg twice daily, metformin 500 mg twice daily, levothyroxine 100 mcg once daily, Lasix 40 mg once daily, Imdur 30 mg take 3 tablets once a day, gabapentin 400 mg three times a day, ferrous sulfate 325 mg once a day, diltiazem 250 mg once a day, benazepril 10 mg once a day, aspirin 81 mg once a day.   PHYSICAL EXAMINATION: GENERAL: The patient is alert and oriented x3. No acute distress. No respiratory distress. Hemodynamically stable.  VITAL SIGNS:  Her heart rate is 100, her blood pressure 170s over 80s, respiratory rate about 24, counted by me. She is on room air, saturating over 95%.  HEENT:  Pupils are equal, round and reactive.  Extraocular movements are intact. Mucosa is  moist. Anicteric sclerae. Pink conjunctivae. No oral lesions.  NECK: Supple. No JVD. No thyromegaly. No adenopathy. No carotid bruits. No masses. No rigidity.   CARDIOVASCULAR: Irregularly irregular with a heart rate of 100, no rubs,or gallops  no murmurs. No displacement of PMI. No significant tenderness to palpation of the anterior chest wall. The patient states that it is just a lot of pressure-like sensation. LUNGS:  Showing some crepitus in both bases. No use of accessory muscles. Good air entrance at this moment.  ABDOMEN: Soft, nontender, nondistended. No hepatosplenomegaly. No masses. Bowel sounds are positive.  EXTREMITIES: No cyanosis, no clubbing, edema +1. No significant rashes or petechiae on the skin. There is some mild ecchymosis of the lower extremities related to minor trauma.  PSYCHIATRIC: Negative for anxiety and depression. The patient is alert, oriented x3.  NEUROLOGIC: Cranial nerves II through XII are intact. No focal findings. Strength is 5/5 in all 4 extremities.  LYMPHATICS: Negative for lymphadenopathy in neck or supraclavicular areas. No signs of reactivation of lymphoma as far as we can tell.  MUSCULOSKELETAL: Significant pain on mobilization of the left hip. No edema or significant joint deformity. The scars from total hip and total knee replacement on the right side are healed.  LABORATORY, DIAGNOSTIC AND RADIOLOGICAL DATA: Glucose 235, BUN 11, creatinine 0.81, sodium 139, potassium 3.5. LFTs within normal limits. Troponin is negative. CK-MB and total CK are normal. White count is 7.1, hemoglobin 12.9, hematocrit 38, platelets 231.   Chest x-ray, as mentioned above, showed moderate mild CHF exacerbation.   EKG: No ST depression or elevation noticed at this moment. The patient has sinus rhythm with multiple premature complexes would account for irregularity, but she has P waves. She has a right bundle branch block.   ASSESSMENT AND PLAN: This is a 81 year old female with history of severe coronary artery disease, severe peripheral vascular disease, diabetes, hypertension, severe family history of MIs. She also has congestive  heart failure, diastolic dysfunction and severe osteoarthritis.  1.  Chest pain, angina. The patient has history of angina for which she takes Ranexa and Imdur at high doses. The patient has been taken off Plavix and aspirin for a possible procedure joint injection in the next couple of days and she developed significant pain today for what I am going to put her back on those medications. I am going to ask that Dr. Mariah Milling evaluate the patient as the patient has not seen a cardiologist in over a year. I think that she needs to be set up with cardiology before going to any type of procedures, even minor procedures, as we can tell that just stopping the Plavix and aspirin for a couple of days put her into severe chest pain. The patient is still having chest pain, for what I am going to add on a nitro paste 1 inch. If the patient continues to have significant chest pain, we will consider putting on nitroglycerin drip. Although, I think this is the only thing that she needs right now is taking her medications that she has not taken today including Ranexa and Imdur.  2.  Tachycardia. The patient is slightly tachycardic at the rate of 100. Her EKG has normal sinus rhythm with PACs. She needs to have a better heart rate in order to decrease any potential myocardial damage. I am going to give her metoprolol IV right now and put her back on her home dose of metoprolol and calcium channel blocker that she takes on a regular basis.  3.  Congestive heart failure. The patient has mild congestive heart failure. She has not taken her medications including her Lasix. I am going to give her 1 p.o. dose of Lasix stat, which is her home dose and monitor closely.  4.  Peripheral vascular disease. Continue Pletal, aspirin and Plavix.  5.  Hyperlipidemia. Continue pravastatin and Trilipix.  6.  Diabetes. Patient has elevated glucose at 235. She has not taken any of her medications. I am going to restart insulin sliding scale, her  premeal insulin and her baseline insulin which is Lantus.  6.  Atrial fibrillation.  She is actually in normal sinus rhythm rate control.  7.  Deep vein thrombosis prophylaxis. I will just stay with aspirin and Plavix and give her subcutaneous dose of heparin. I do not think she needs to be fully anticoagulated at this moment.  CODE STATUS:  The patient is full code.    TIME SPENT: I spent about 55 minutes with this admission.   ____________________________ Felipa Furnace, MD rsg:cc D: 03/22/2012 17:05:26 ET T: 03/22/2012 18:23:42 ET JOB#: [id number]  cc: Felipa Furnace, MD, <Dictator> Clare Gandy, MD Antonieta Iba, MD Marcine Matar., MD  Mcdaniel Ohms Juanda Chance MD ELECTRONICALLY  SIGNED 03/30/2012 13:44

## 2014-06-09 NOTE — Consult Note (Signed)
Brief Consult Note: Diagnosis: Chest pain possible unstable angina, PAD.   Patient was seen by consultant.   Consult note dictated.   Orders entered.   Comments: Recommend a nuclear stress test tomorrow. Will reserve cardiac cath for high risk ischemia. Resume Aspirin.  She will establish with me..  Electronic Signatures: Lorine BearsArida, Kenndra Morris (MD)  (Signed 04-Feb-14 16:31)  Authored: Brief Consult Note   Last Updated: 04-Feb-14 16:31 by Lorine BearsArida, Adely Facer (MD)

## 2014-06-09 NOTE — Consult Note (Signed)
PATIENT NAME:  [name], [name] DATE OF BIRTH:  [birth year]  DATE OF CONSULTATION:  03/23/2012  REFERRING PHYSICIAN:  Felipa Furnaceoberto Sanchez Gutierrez, MD CONSULTING PHYSICIAN:  Chelsea AusMuhammad A. Kirke CorinArida, MD  PRIMARY CARE PHYSICIAN: Marcine Matarharles W. Phillips, Jr., MD  PREVIOUS CARDIOLOGIST:  Runell GessJonathan J. Berry, [place] in [place], but has not seen him in over a year.    REASON FOR CONSULTATION: Chest pain and shortness of breath.   HISTORY OF PRESENT ILLNESS: This is a pleasant 81 year old Caucasian female with known history of coronary artery disease, status post CABG in 1988. She had multiple PCI done on vein grafts as well. Most recent cardiac catheterization was done in 2012, which showed severe native 3-vessel disease with occluded native arteries. The LIMA to LAD was patent, but there was diffuse distal LAD disease beyond the anastomosis. The SVG to OM1 was patent. There was no patent graft to the right coronary artery, which was filling via collaterals from the left side. Ejection fraction was mildly reduced at 45%. She also has known history of peripheral arterial disease, followed conservatively by Dr. Durwin Noraixon. She does not have good conduits for bypass. She has known history of paroxysmal atrial fibrillation maintained in sinus rhythm with amiodarone. She presented to the Emergency Room with severe substernal chest pain similar to her prior angina. Her angina been reasonably controlled over the last year. She was taken off aspirin and Plavix recently to do a hip injection. This was 2 days ago. The procedure is scheduled in a few days. The patient's EKG did not show any acute ischemic changes. Cardiac enzymes are negative. She is currently chest pain-free.   PAST MEDICAL HISTORY: 1. Coronary artery disease status post coronary artery bypass graft surgery and stenting as outlined above.  2.  Peripheral arterial disease with claudication.  3.  Hypertension.  4.  Type 2 diabetes.  5.  Paroxysmal  atrial fibrillation.  6.  Chronic systolic and diastolic heart failure.  7.  Hyperlipidemia.  8.  History of lymphoma in remission.   SOCIAL HISTORY: She is a previous smoker. She denies any alcohol or recreational drug use.   FAMILY HISTORY: Remarkable for coronary artery disease.   ALLERGIES: INCLUDE IV DYE, PENICILLIN AND VALIUM.   HOME MEDICATIONS: Listed in her chart and were reviewed.   PHYSICAL EXAMINATION: GENERAL: The patient appears to be at her stated age in no acute distress.  VITAL SIGNS: Temperature 98.2, pulse 70, respiratory rate 18, blood pressure 170/77, oxygen saturation is 94% on 2 liters nasal cannula.  HEENT: Normocephalic, atraumatic.  NECK: No JVD or carotid bruits.  RESPIRATORY: Normal respiratory effort with no use of accessory muscles. Auscultation reveals normal breath sounds.  CARDIOVASCULAR: Normal PMI. Normal S1, S2 with no gallops or murmurs.  ABDOMEN: Benign, nontender, and nondistended.  EXTREMITIES: With no clubbing, cyanosis, or edema. The patient has no palpable distal pulses. Femoral pulse is diminished on the right side and very diminished on the left side.  SKIN: Warm and dry with no rash.  PSYCHIATRIC: She is alert and oriented x3 with normal mood and affect.   LABORATORY AND DIAGNOSTIC DATA: ECG showed sinus rhythm with right bundle branch block and frequent PVCs. Cardiac enzymes were negative. Creatinine was 1.2. Hemoglobin was 11.7.   IMPRESSION: 1.  Chest pain and possible unstable angina with known extensive history of coronary artery disease.  2.  Peripheral arterial disease with claudication.  3.  Paroxysmal atrial fibrillation currently in sinus rhythm.  4.  Hypertension.  5.  Hyperlipidemia.   RECOMMENDATIONS: The patient's symptoms are worrisome for possible unstable angina after the recent cessation of dual antiplatelet therapy for anticipated hip injection. I reviewed her most recent cardiac catheterization in 2012. I recommend  proceeding with a pharmacologic nuclear stress test to evaluate her ischemic burden. I recommend reserving cardiac catheterization only for high-risk ischemia, given her significant peripheral arterial disease and ALLERGY TO IV DYE. She also reports bleeding complications during her most recent cardiac catheterization. Continue medical therapy for coronary artery disease. I recommend resuming aspirin. Regarding atrial fibrillation, she is in sinus rhythm. Continue treatment with amiodarone. She is not on anticoagulation, and I suspect that is due to the fact that she is on triple antiplatelet therapy with aspirin, Plavix and Pletal. If the stress test does not show significant ischemia, Plavix can likely be kept on hold until she gets hip injection. Aspirin should be continued. The patient expressed her wish to establish with a local cardiologist given the difficulty in transportation to [place]. She will be following up with me in the [place] office.      ____________________________ Chelsea Aus. Kirke Corin, MD maa:cc D: 03/23/2012 16:41:55 ET T: 03/23/2012 17:18:10 ET JOB#: [id number]  cc: Brion Hedges A. Kirke Corin, MD, <Dictator> Felipa Furnace, MD Marcine Matar., MD Runell Gess, MD in [facility] Argentina Donovan MD ELECTRONICALLY SIGNED 04/09/2012 13:59

## 2014-06-10 NOTE — H&P (Signed)
PATIENT NAME:  [name], [name] MR#:  [id number] DATE OF BIRTH:  03-29-1931  DATE OF ADMISSION:  12/21/2013  PRIMARY CARE PROVIDER: Marcine Matarharles W. Phillips Jr., MD  [facility]OLOG[facility]   ED PHYSICIAN: Dr. Mayford KnifeWilliams    CHIEF COMPLAINT: Chest pressure, shortness of breath.   HISTORY OF PRESENT ILLNESS: The patient is an 81 year old, white female with known history of hypertension, coronary artery disease, diabetes, hyperlipidemia, history of paroxysmal atrial fibrillation with previous history of PCI to her CABG vessels, who presents with complaint of chest pressure since this morning. The patient reports that she woke up having pressure, feeling like somebody was sitting on her chest since this morning. The patient came to the ED. She does have [name] history of paroxysmal atrial fibrillation, and her heart rate was intermittently elevated. The patient otherwise also reports that she is chronically short of breath, and that is unchanged. She did have some pain radiation into her neck. She denies any nausea, vomiting or diarrhea. She does complain of worsening shortness of breath since yesterday. She uses 2 pillows at nighttime. She denies any significant lower extremity swelling.   PAST MEDICAL HISTORY:  1. History of hypertension.  2. History of diabetes.  3.  Severe coronary artery disease, status post bypass with history of having PCI to the bypass vessels. 4.  Chronic systolic CHF with EF of 40% to 45%, based on previous records.  5. Hyperlipidemia.  6. Paroxysmal atrial fibrillation.  7. Cerebral peripheral vascular disease.  8. Peripheral neuropathy.  9. Hypothyroidism.  10.  History of lymphoma, status post radiation and resection of lymph nodes, according to the patient.  11.  History of glaucoma.   ALLERGIES: PENICILLIN, VALIUM, IVP DYE, IODINE.    PAST SURGICAL HISTORY: Status post right hip replacement, right knee replacement, cholecystectomy, right femur repair after  fracture, appendectomy, hysterectomy and CABG.   SOCIAL HISTORY: No alcohol. No tobacco. No drug use.   HOME MEDICATIONS: Pravastatin 40 mg daily, potassium chloride 10 meq daily, Protonix 40 mg 1 tab p.o. b.i.d., oxycodone 5 mg q.4 h p.r.n., levothyroxine 100 mcg daily, Lasix 40 mg daily, Lantus 20 units at bedtime, Imdur 90 mg daily, Humalog 10 units 3 times [name] day prior to meals, diltiazem 240 mg daily, carvedilol 3.125, 1 tablet p.o. b.i.d., Benazepril 20 mg daily, aspirin 81 mg 1 tab p.o. daily, Tylenol 500 mg q.4 h p.r.n.   REVIEW OF SYSTEMS: CONSTITUTIONAL: Denies any fevers, fatigue, weakness. No weight loss or weight gain.  EYES: No blurred or double vision. No redness. No inflammation. Has [name] history of glaucoma.  ENT: No tinnitus. No ear pain. No hearing loss. No seasonal or year-round allergies. No difficulty swallowing.  RESPIRATORY: Denies any cough, wheezing, hemoptysis. No COPD.  CARDIOVASCULAR: Complains of chest pressure. History of CHF. Complains of dyspnea on exertion. History of paroxysmal atrial fibrillation,  GASTROINTESTINAL: No nausea, vomiting, diarrhea. No abdominal pain. No hematemesis. No melena.  GENITOURINARY: Denies any dysuria, hematuria, renal colic or frequency.  ENDOCRINE: Denies any polyuria, nocturia. Has hypothyroidism.  HEMATOLOGIC AND LYMPHATIC: Denies anemia, easy bruisability or bleeding.  SKIN: No acne. No rash.  MUSCULOSKELETAL: Denies any pain in the neck, back or shoulder.  NEUROLOGIC: Does have some numbness and neuropathy. No CVA, TIA or seizures.  PSYCHIATRIC: No anxiety, insomnia, or ADD.   PHYSICAL EXAMINATION:  VITAL SIGNS: Temperature 97.9, pulse 120, respirations 18, blood pressure 129/80, O2 of 96%.  GENERAL: The patient is [name] well-developed female, currently not in any acute  distress.  HEENT: Head atraumatic, normocephalic. Pupils equally round, reactive to light and accommodation. There is no conjunctival pallor. No sclerae icterus. Nasal  exam shows no drainage or ulceration. Oropharynx is clear without any exudate.   NECK: Supple without any thyromegaly.  CARDIOVASCULAR: Irregularly irregular rhythm with no murmurs, rubs, clicks or gallops.  LUNGS: She has crackles at the bases. No accessory muscle usage.  ABDOMEN: Soft, nontender, nondistended. Positive bowel sounds x4.  EXTREMITIES: Trace edema in the lower extremities. No clubbing, cyanosis.  SKIN: No rash.  LYMPH NODES: Nonpalpable.  VASCULAR: Good DP, PT pulses.  PSYCHIATRIC: Not anxious or depressed.  NEUROLOGIC: Awake, alert, and oriented x3. Cranial nerves II through XII grossly intact. No focal deficits.   EKG: Atrial fibrillation with RVR with PVCs.   LABORATORY DATA: Glucose 219, BUN 32, creatinine 1.40, sodium 139, potassium 4.7, chloride 103, CO2 30, calcium 8.9. Troponin less than 0.02. WBC 7.5, hemoglobin 11.9, platelet count 321,000. Chest x-ray shows low-grade CHF.   ASSESSMENT AND PLAN: The patient is an 81 year old white female with history of coronary artery disease, coronary artery bypass graft, percutaneous coronary intervention, history of systolic congestive heart failure, presents with chest pressure.   1.  Chest pressure with history of coronary artery disease with occlusion in her bypass grafts, who presents with these symptoms. We will check serial cardiac enzymes. We will place her on aspirin, morphine p.r.n. I am going to put her on [name] nitroglycerin patch. Hold her Imdur.  2.  Acute on chronic systolic CHF. Instead of p.o. Lasix, I will give her IV Lasix. Monitor her ins and outs.  3.  Atrial fibrillation with rapid ventricular response. Continue p.o. Cardizem, Coreg. I will give her one dose of digoxin and p.r.n. IV Cardizem for elevated heart rate.  4.  Diabetes. We will continue Lantus and premeal insulin.  5.  Hyperlipidemia. Continue pravastatin, check [name] fasting lipid panel in the [name].m.  6.  Hypothyroidism. Continue levothyroxine. Check [name] TSH.  In the morning.  7.  Gastroesophageal reflux disease. We will continue Protonix.  8.  Chronic renal failure, appears to be close to baseline. Monitor with her being on IV Lasix.  9.  Miscellaneous. The patient will be on SCDs for deep vein thrombosis prophylaxis.   Time spent on this patient is 50 minutes.    ____________________________ Lacie Scotts Allena Katz, MD shp:je D: 12/21/2013 13:52:57 ET T: 12/21/2013 14:33:06 ET JOB#: [id number]  cc: Shikha Bibb H. Allena Katz, MD, <Dictator> Charise Carwin MD ELECTRONICALLY SIGNED 01/04/2014 19:25

## 2014-06-10 NOTE — Consult Note (Signed)
PATIENT NAME:  Sara [name], Sara [name] MR#:  [id number] DATE OF BIRTH:  Jun 12, 1931  DATE OF CONSULTATION:  07/14/2013  REFERRING PHYSICIAN:  Delfino LovettVipul Shah, MD CONSULTING PHYSICIAN:  Illene LabradorJames P. Angie FavaHooten Jr., MD  CHIEF COMPLAINT: Right thigh and knee pain.   BRIEF HISTORY OF PRESENT ILLNESS: The patient is [name] 81 year old female who fell twice earlier this morning. On the second fall, she landed on the floor, primarily on the right side. She had the sudden onset of severe right knee and distal thigh pain and was unable to stand or bear weight due to the pain. She presented to [facility], at which time x-rays demonstrated [name] comminuted right distal femoral (periprosthetic) fracture.   PAST MEDICAL HISTORY: Hypertension, diabetes, severe coronary artery disease status post bypass, chronic systolic heart failure (ejection fraction of 40% to 45%), hyperlipidemia, paroxysmal atrial fibrillation, severe peripheral vascular disease, hypothyroidism, lymphoma, status post right total hip arthroplasty, status post right total knee arthroplasty, cholecystectomy, appendectomy, hysterectomy, coronary artery bypass graft and lower extremity vascular bypass.   ALLERGIES: PENICILLIN, IVP DYE, IODINE AND VALIUM.   SOCIAL HISTORY: The patient denies any tobacco, alcohol or drug use. She lives with her daughter.   FAMILY HISTORY: Noncontributory.   REVIEW OF SYSTEMS:  Pertinent musculoskeletal and neurologic review of systems is notable for pain to the right knee. She denies any gross numbness or weakness.   PHYSICAL EXAMINATION: GENERAL: The patient is pleasant, well-developed, well-nourished female, seen lying in bed in no acute distress.  HEENT: Atraumatic and normocephalic. Sclerae are clear. Extraocular motion is intact. Oropharynx is clear with moist mucosa.  NECK: Supple, nontender, with good range of motion.  LUNGS: Clear to auscultation bilaterally.  CARDIOVASCULAR: Regular rate and rhythm with  normal S1, S2. No appreciable murmurs, gallops or rubs. Pedal pulses are not palpable. No gross lower extremity edema.  ABDOMEN: Soft, nontender, nondistended.  MUSCULOSKELETAL: Good range of motion and stability of the upper extremities. No tenderness to palpation about the shoulders, elbows or wrists.  EXTREMITIES:  Examination of the lower extremities notable for findings of the right lower extremity. Grossly unstable distal femur is appreciated. Moderate swelling is noted. Skin is intact. Well-healed surgical incision is noted anteriorly.  NEUROLOGIC: Awake, alert and oriented. Sensory function is grossly intact. Motor strength is felt to be 5/5 with the exception of the right lower extremity, which was not assessed due to the injury.   X-RAYS: Radi[facility] of the right [facility] were reviewed. There is [name] comminuted fracture of the right distal femur directly above femoral component of total knee arthroplasty. Vascular grafts are visualized posteriorly. Vascular calcification is also noted.   IMPRESSION: Comminuted right distal femoral periprosthetic fracture.   PLAN: Findings were discussed in detail with the patient. Recommendation was made for open reduction and internal fixation of the right distal femur. The usual perioperative course was discussed. Risks and benefits of surgical intervention were discussed in detail. I appreciate medicines' note.  The patient is obviously [name] high risk for any surgical intervention. These findings were discussed not only with the patient, but also with her daughter, Gaspar BiddingJennifer Phillips. They expressed understanding of the risks, benefits and agreed with plans for surgical intervention.   I appreciate Dr. Festus BarrenJason Dew evaluating the patient from [name] vascular standpoint.    ____________________________ Illene LabradorJames P. Angie FavaHooten Jr., MD jph:dmm D: 07/14/2013 20:40:48 ET T: 07/14/2013 21:30:09 ET JOB#: [id number]  cc: Illene LabradorJames P. Angie FavaHooten Jr., MD,  <Dictator> JAMES P Angie FavaHOOTEN JR  MD ELECTRONICALLY SIGNED 07/16/2013 15:09

## 2014-06-10 NOTE — H&P (Signed)
PATIENT NAME:  Sara [name], Sara [name] MR#:  [id number] DATE OF BIRTH:  1932-02-08  DATE OF ADMISSION:  04/08/2013  PRIMARY CARE PHYSICIAN: Dr. Vear ClockPhillips.   PRIMARY CARDIOLOGIST: [facility].   CHIEF COMPLAINT: Shortness of breath and dyspnea on exertion.   HISTORY OF PRESENT ILLNESS: This is [name] very pleasant 81 year old female with [name] history of congestive heart failure, EF of 45%, severe coronary artery disease, status post bypass, hypertension, diabetes, who presents with the above complaint. Over the past day, the patient has had shortness of breath, dyspnea on exertion, PND, orthopnea, and lower extremity edema. Three days ago, she says she was in her usual state of health.  This all started yesterday. She denies any fever or chills or any other respiratory symptoms.    REVIEW OF SYSTEMS:  CONSTITUTIONAL: No fever. Positive fatigue, weakness.  EYES: No blurred or double vision. ENT:  Mild hearing loss. No postnasal drip or sinus pain.  RESPIRATORY:  No cough, wheezing, hemoptysis. Positive dyspnea.  CARDIOVASCULAR: She does say that she has some chest pain associated with shortness of breath. Positive orthopnea. Positive PND. Positive dyspnea on exertion. No palpitations OR syncope.  GASTROINTESTINAL: No nausea, vomiting, diarrhea, abdominal pain, melena or ulcers. GENITOURINARY:  No dysuria or hematuria.  ENDOCRINE: No polyuria or polydipsia.  HEMATOLOGIC AND LYMPHATIC: No anemia or easy bleeding.  SKIN: No rash or lesions.  MUSCULOSKELETAL: No pain in the shoulders or neck.  NEUROLOGIC: No history of CVA or TIA.  PSYCHIATRIC:  No history of anxiety or depression.   PAST MEDICAL HISTORY: 1.  Severe coronary disease, status post bypass.  2.  Chronic systolic heart failure, EF of 21%40% to 45%.  3.  Hypertension. 4.  Diabetes.  5.  Hyperlipidemia.  6.  Paroxysmal atrial fibrillation.  7.  Severe peripheral vascular disease.  8.  Hypothyroidism.  9.  Lymphoma.  MEDICATIONS:  1.   Aspirin 81 mg daily.  2.  Benazepril 20 mg daily.  3.  Diltiazem 240 mg daily.  4.  Gabapentin 400 mg t.i.d.  5.  Humalog sliding scale 10 units t.i.d.  6.  Imdur 90 mg daily.  7.  Lantus 50 units at bedtime.  8.  Lasix 40 mg b.i.d.  9.  Synthroid 100 mcg daily.  10.  Metformin 500 mg b.i.d.  11.  Metoprolol 50 mg b.i.d.  12.  Pletal 50 mg b.i.d.  13.  Plavix 75 mg daily.  14.  Pacerone 200 mg b.i.d.  15.  Pravastatin 40 mg daily.  16.  Potassium 10 mEq daily.  17.  Ranexa 500 mg daily.   ALLERGIES: PENICILLIN, IVP DYE, IODINE AND VALIUM.   PAST SURGICAL HISTORY: 1.  Right hip replacement.  2.  Right knee replacement.  3.  Cholecystectomy.  4.  Appendectomy.  5.  Hysterectomy.  6.  CABG.   SOCIAL HISTORY: No tobacco, alcohol or drug use. She lives with her daughter.    PHYSICAL EXAMINATION: VITAL SIGNS: Temperature 98.3, pulse is 60 respirations 22, blood pressure 164/74, 95% on BiPAP.  GENERAL: The patient is alert, oriented. She is wearing [name] BiPAP machine, not in acute distress.  HEENT: Head is atraumatic. Pupils are round and reactive. Sclerae and mucous membranes were not examined due to the patient wearing [name] BiPAP machine. Oropharynx is not examined.  NECK: Reveals JVD passed the level of the jaw. I cannot appreciate enlarged thyroid or lymphadenopathy.  CARDIOVASCULAR: Regular rate and rhythm. No murmurs, gallops or rubs. PMI is hard to palpate.  LUNGS:  She has bilateral crackles about [name] third of the way up.  No dullness to percussion. No egophony. No tactile fremitus. Normal chest expansion.  BACK: No CVA or vertebral tenderness.  ABDOMEN: Bowel sounds are positive. Nontender, nondistended. Noreboudn or guarding SKIN: Without rash or lesions.  EXTREMITIES: She has 1+ lower extremity edema. No cyanosis or clubbing.  NEUROLOGIC: Cranial nerves II through XII are grossly intact. There are no focal deficits.    LABORATORY DATA: Urinalysis shows 1+ blood, positive  nitrite with 5 white blood cells.   Troponin less than 0.02. CK 65, CPK-MB 22.1, INR 1.1. White blood cells 11, hemoglobin 12.3, hematocrit 36, platelets 247. Sodium 138, potassium 4.5, chloride 104, bicarbonate 31, BUN 21, creatinine 1.30, glucose 295, calcium 8.7, bilirubin 0.4, alkaline phosphatase 99, ALT 22, AST 27, total protein 7.0, albumin 3.3. BNP 2399.   EKG:  Sinus rhythm with [name] right bundle branch block.   Chest x-ray shows pulmonary edema.   ASSESSMENT AND PLAN: [name] 81 year old female with [name] history of chronic systolic heart failure with an ejection fraction of about 40% to 45%, severe coronary artery disease with most recent cardiac catheterization in 2012 showing, severe native three-vessel disease with occluded native arteries who presents with shortness of breath, dyspnea on exertion, PND, orthopnea, and increasing lower extremity edema and on physical exam has bilateral crackles as well as jugular venous distention consistent with acute congestive heart failure.  1.  Acute on chronic systolic heart failure, ejection fraction of 40% to 45%. The patient is currently on [name] BiPAP machine, which will help with her pulmonary edema. We will order Lasix 40 IV q. 12 hours. We will monitor creatinine closely. Ins and outs and daily weight will also be monitored closely as well. I suspect that we will be able to wean her off of this BiPAP machine fairly quickly.  2.  Acute respiratory failure secondary to problem #1.  The patient is currently on [name] BiPAP machine. We will wean this off as tolerated.  3.  Chest pain. The patient was mentioning that she has some chest pain associated with shortness of breath. Given the severity of her coronary artery disease, we will continue to monitor her cardiac enzymes.  The first set are negative. She has no acute changes on EKG.  4.  Hypertension. We will continue her outpatient medications.  5.  Diabetes. We will hold metformin and continue her other outpatient  medications including Lantus, ADA diet and sliding scale insulin.  6.  History of hypothyroidism. Continue Synthroid.  7.  History of lymphoma, stable at this time.  8.  The patient is [name] FULL CODE status.    TIME SPENT: Approximately 45 minutes.   ____________________________ Janyth Contes. Juliene Pina, MD spm:dp D: 04/08/2013 16:13:35 ET T: 04/08/2013 16:54:06 ET JOB#: [id number]  cc: Harmoni Lucus P. Juliene Pina, MD, <Dictator> Marcine Matar., MD  Janyth Contes Depaul Arizpe MD ELECTRONICALLY SIGNED 04/08/2013 17:49

## 2014-06-10 NOTE — Consult Note (Signed)
General Aspect 81 year old Caucasian female with known history of coronary artery disease, status post CABG in 1988, multiple PCI done on vein grafts, cardiac catheterization done in 2012, She also has known history of peripheral arterial disease, followed conservatively by Dr. Doren Custard and chronic systolic heart failure. She has  known history of paroxysmal atrial fibrillation maintained in sinus rhythm with amiodarone, presenting right hip fracture after a fall. Cardiology was consulted for preoperative CV evaluation.  She had a mechanical fall with no syncope. She denies chest pain or worsening dyspnea.   Present Illness . Prior cardiac cath in 2012 showing : severe native 3-vessel disease with occluded native arteries, The LIMA to LAD was patent, but there was diffuse distal LAD disease beyond the anastomosis. The SVG to OM1 was patent. There was no patent graft to the right coronary artery, which was filling via collaterals from the left side. Ejection fraction was mildly reduced at 45%.  prior stress test in 2014 with no ischemia  ECHO in 2014 with EF 45 to 50%, inferior and posterior wall hypokinesis.   Physical Exam:  GEN well developed, well nourished, no acute distress   HEENT hearing intact to voice, moist oral mucosa   NECK supple   RESP normal resp effort  crackles   CARD Regular rate and rhythm  No murmur   ABD denies tenderness  soft   EXTR negative edema   NEURO motor/sensory function intact   PSYCH alert, A+O to time, place, person, good insight   Review of Systems:  Subjective/Chief Complaint fall and hip fracture   General: Fatigue   Skin: No Complaints   ENT: No Complaints   Eyes: No Complaints   Neck: No Complaints   Respiratory: Short of breath   Cardiovascular: Dyspnea   Gastrointestinal: No Complaints   Genitourinary: No Complaints   Vascular: No Complaints   Musculoskeletal: No Complaints   Neurologic: No Complaints   Hematologic: No  Complaints   Endocrine: No Complaints   Psychiatric: No Complaints   Review of Systems: All other systems were reviewed and found to be negative   Medications/Allergies Reviewed Medications/Allergies reviewed   Family & Social History:  Family and Social History:  Family History Negative   Social History negative tobacco   Lab Results: Rhogam:  28-May-15 12:10   Notification HUNTER ORE  Notification Date 07-14-13  Notification Time 1342 (Result(s) reported on 14 Jul 2013 at 01:51PM.)  Thyroid:  28-May-15 10:24   Thyroid Stimulating Hormone  6.95 (0.45-4.50 (International Unit)  ----------------------- Pregnant patients have  different reference  ranges for TSH:  - - - - - - - - - -  Pregnant, first trimetser:  0.36 - 2.50 uIU/mL)  Routine BB:  28-May-15 10:24   ABO Group + Rh Type O Positive  Antibody Screen POSITIVE (Result(s) reported on 14 Jul 2013 at 11:43AM.)    12:10   Antibody 1 Anti- DUFFY A  Cardiology:  28-May-15 10:38   Ventricular Rate 69  Atrial Rate 69  P-R Interval 216  QRS Duration 142  QT 506  QTc 542  P Axis 25  R Axis 12  T Axis 17  ECG interpretation Sinus rhythm with 1st degree A-V block Right bundle branch block Abnormal ECG When compared with ECG of 14-Jul-2013 10:38, Sinus rhythm has replaced Wide QRS rhythm ----------unconfirmed---------- Confirmed by OVERREAD, NOT ([id number]), editor PEARSON, BARBARA ([id number]) on 07/14/2013 1:25:31 PM  Routine Chem:  28-May-15 10:24   Glucose, Serum  108  BUN  29  Creatinine (comp)  1.68  Sodium, Serum 138  Potassium, Serum 4.5  Chloride, Serum 107  CO2, Serum 28  Calcium (Total), Serum 8.9  Anion Gap  3  Osmolality (calc) 282  eGFR (African American)  33  eGFR (Non-African American)  29 (eGFR values <55m/min/1.73 m2 may be an indication of chronic kidney disease (CKD). Calculated eGFR is useful in patients with stable renal function. The eGFR calculation will not be reliable in acutely ill  patients when serum creatinine is changing rapidly. It is not useful in  patients on dialysis. The eGFR calculation may not be applicable to patients at the low and high extremes of body sizes, pregnant women, and vegetarians.)  Cardiac:  28-May-15 10:24   Troponin I < 0.02 (0.00-0.05 0.05 ng/mL or less: NEGATIVE  Repeat testing in 3-6 hrs  if clinically indicated. >0.05 ng/mL: POTENTIAL  MYOCARDIAL INJURY. Repeat  testing in 3-6 hrs if  clinically indicated. NOTE: An increase or decrease  of 30% or more on serial  testing suggests a  clinically important change)    14:47   Troponin I 0.02 (0.00-0.05 0.05 ng/mL or less: NEGATIVE  Repeat testing in 3-6 hrs  if clinically indicated. >0.05 ng/mL: POTENTIAL  MYOCARDIAL INJURY. Repeat  testing in 3-6 hrs if  clinically indicated. NOTE: An increase or decrease  of 30% or more on serial  testing suggests a  clinically important change)  Routine UA:  28-May-15 11:14   Color (UA) Yellow  Clarity (UA) Clear  Glucose (UA) Negative  Bilirubin (UA) Negative  Ketones (UA) Negative  Specific Gravity (UA) 1.010  Blood (UA) 1+  pH (UA) 6.0  Protein (UA) 30 mg/dL  Nitrite (UA) Positive  Leukocyte Esterase (UA) Trace (Result(s) reported on 14 Jul 2013 at 12:04PM.)  RBC (UA) 1 /HPF  WBC (UA) 8 /HPF  Bacteria (UA) 1+  Epithelial Cells (UA) 1 /HPF (Result(s) reported on 14 Jul 2013 at 12:04PM.)  Routine Coag:  28-May-15 10:24   Activated PTT (APTT) 29.0 (A HCT value >55% may artifactually increase the APTT. In one study, the increase was an average of 19%. Reference: "Effect on Routine and Special Coagulation Testing Values of Citrate Anticoagulant Adjustment in Patients with High HCT Values." American Journal of Clinical Pathology 2006;126:400-405.)  Prothrombin 13.7  INR 1.1 (INR reference interval applies to patients on anticoagulant therapy. A single INR therapeutic range for coumarins is not optimal for all indications;  however, the suggested range for most indications is 2.0 - 3.0. Exceptions to the INR Reference Range may include: Prosthetic heart valves, acute myocardial infarction, prevention of myocardial infarction, and combinations of aspirin and anticoagulant. The need for a higher or lower target INR must be assessed individually. Reference: The Pharmacology and Management of the Vitamin K  antagonists: the seventh ACCP Conference on Antithrombotic and Thrombolytic Therapy. CRCBUL.8453Sept:126 (3suppl): [id number] A HCT value >55% may artifactually increase the PT.  In one study,  the increase was an average of 25%. Reference:  "Effect on Routine and Special Coagulation Testing Values of Citrate Anticoagulant Adjustment in Patients with High HCT Values." American Journal of Clinical Pathology 2006;126:400-405.)  Routine Hem:  28-May-15 10:24   WBC (CBC) 7.1  RBC (CBC)  3.16  Hemoglobin (CBC)  9.7  Hematocrit (CBC)  29.2  Platelet Count (CBC) 259  MCV 92  MCH 30.8  MCHC 33.3  RDW  14.8  Neutrophil % 59.7  Lymphocyte % 22.0  Monocyte % 10.4  Eosinophil % 7.3  Basophil %  0.6  Neutrophil # 4.2  Lymphocyte # 1.6  Monocyte # 0.7  Eosinophil # 0.5  Basophil # 0.0 (Result(s) reported on 14 Jul 2013 at 11:15AM.)   EKG:  EKG NSR   Abnormal RBBB    Penicillin: Rash  Iodine Strong: Rash  IVP Dye: Other  Valium: Other  Vital Signs/Nurse's Notes: **Vital Signs.:   29-May-15 04:52  Vital Signs Type Routine  Temperature Temperature (F) 98.3  Celsius 36.8  Pulse Pulse 76  Respirations Respirations 18  Systolic BP Systolic BP 419  Diastolic BP (mmHg) Diastolic BP (mmHg) 50  Mean BP 71  Pulse Ox % Pulse Ox % 95  Pulse Ox Activity Level  At rest  Oxygen Delivery Room Air/ 21 %    Impression 81 year old Caucasian female with known history of coronary artery disease, status post CABG in 1988, multiple PCI done on vein grafts, cardiac catheterization done in 2012, She also has known  history of peripheral arterial disease,   known paroxysmal atrial fibrillation maintained in sinus rhythm with amiodarone and chronic systolic heart failure. Cardiology was consulted for preop evaluation before hip repair.   1) Preop CV evaluation: the patient has extensive cardiac history. However, she has been stable in last few months with no angina. She did not have syncope. Negative cardiac enzymes and ECG without ischemic changes.   2) Paroxysmal atrial fib in sinus rhyhtm.   3) CAD, CABG stable.   4) PVD: stable.   Electronic Signatures: Kathlyn Sacramento (MD)  (Signed 29-May-15 07:19)  Authored: General Aspect/Present Illness, History and Physical Exam, Review of System, Family & Social History, Labs, EKG , Allergies, Vital Signs/Nurse's Notes, Impression/Plan   Last Updated: 29-May-15 07:19 by Kathlyn Sacramento (MD)

## 2014-06-10 NOTE — Consult Note (Signed)
General Aspect 81 year old Caucasian female with known history of coronary artery disease, status post CABG in 1986/05/01, multiple PCI done on vein grafts, cardiac catheterization done in 01-May-2010, She also has known history of peripheral arterial disease, followed conservatively by Dr. Doren Custard. She does not have good conduits for bypass,  known paroxysmal atrial fibrillation maintained in sinus rhythm with amiodarone, presenting with SOB. Cardiology was consulted for acute on chronic diastolic and systolic CHF.  Over the past day or so, the patient has had  dyspnea on exertion, PND, orthopnea, and lower extremity edema. Three days ago, she says she was in her usual state of health.  She denies any fever or chills or any other respiratory symptoms.   Present Illness . Prior cardiac cath in 05/01/10 showing : severe native 3-vessel disease with occluded native arteries, The LIMA to LAD was patent, but there was diffuse distal LAD disease beyond the anastomosis. The SVG to OM1 was patent. There was no patent graft to the right coronary artery, which was filling via collaterals from the left side. Ejection fraction was mildly reduced at 45%.  prior stress test in 2012/04/30 with no ischemia  ECHO in 2012/04/30 with EF 45 to 50%, inferior and posterior wall hypokinesis.   Physical Exam:  GEN well developed, well nourished, no acute distress   HEENT hearing intact to voice, moist oral mucosa   NECK supple   RESP normal resp effort  crackles   CARD Regular rate and rhythm  No murmur   ABD denies tenderness  soft   EXTR negative edema   NEURO motor/sensory function intact   PSYCH alert, A+O to time, place, person, good insight   Review of Systems:  Subjective/Chief Complaint SOB,   General: Fatigue   Skin: No Complaints   ENT: No Complaints   Eyes: No Complaints   Neck: No Complaints   Respiratory: Short of breath   Cardiovascular: No Complaints   Gastrointestinal: No Complaints   Genitourinary:  No Complaints   Vascular: No Complaints   Musculoskeletal: No Complaints   Neurologic: No Complaints   Hematologic: No Complaints   Endocrine: No Complaints   Psychiatric: No Complaints   Review of Systems: All other systems were reviewed and found to be negative   Medications/Allergies Reviewed Medications/Allergies reviewed     lymphoma:    Oncology Protocol: [facility] 05/20/06   Diabetes:    MI - Myocardial Infarct:    HTN:    right hip replacedment:    right knee replacement:    Cholecystectomy:    Appendectomy:    Hysterectomy - Total:    CABG (Coronary Artery Bypass Graft):    Death of spouse: 01-May-2010       Admit Diagnosis:   CONGESTIVE HEART FAILURE: Onset Date: 09-Apr-2013, Status: Active, Description: CONGESTIVE HEART FAILURE  Home Medications: Medication Instructions Status  Lasix 40 mg oral tablet 1 tab(s) orally 2 times a day Active  levothyroxine 100 mcg (0.1 mg) oral tablet 1 tab(s) orally once a day Active  potassium chloride 10 mEq oral capsule, extended release 1 cap(s) orally once a day Active  metoprolol tartrate 50 mg oral tablet 1 tab(s) orally every 12 hours Active  Pletal 50 mg oral tablet 1 tab(s) orally 2 times a day Active  Plavix 75 mg oral tablet 1 tab(s) orally once a day Active  aspirin 81 mg oral tablet 1 tab(s) orally once a day Active  pravastatin 40 mg oral tablet 1 tab(s) orally once a day (  at bedtime) Active  metformin 500 mg oral tablet 1 tab(s) orally 2 times a day Active  gabapentin capsule 400 mg 1 cap(s) orally 3 times a day Active  Pacerone 200 mg oral tablet 1 tab(s) orally 2 times a day Active  Ranexa 500 mg oral tablet, extended release 1 tab(s) orally once a day Active  diltiazem 240 mg/24 hours oral capsule, extended release 1 cap(s) orally once a day Active  Lantus 100 units/mL subcutaneous solution 30 unit(s) subcutaneous once a day (at bedtime) Active  HumaLOG 100 units/mL subcutaneous solution 10  unit(s) subcutaneous 3 times a day (before meals) Active  benazepril 20 mg oral tablet 1 tab(s) orally once a day Active  Imdur 90 milligram(s) orally once a day Active   Lab Results:  Thyroid:  21-Feb-15 04:21   Thyroid Stimulating Hormone 2.15 (0.45-4.50 (International Unit)  ----------------------- Pregnant patients have  different reference  ranges for TSH:  - - - - - - - - - -  Pregnant, first trimetser:  0.36 - 2.50 uIU/mL)  Routine Chem:  21-Feb-15 04:21   Glucose, Serum  131  BUN  26  Creatinine (comp)  1.54  Sodium, Serum 139  Potassium, Serum 3.7  Chloride, Serum 103  CO2, Serum 29  Calcium (Total), Serum  8.3  Anion Gap 7  Osmolality (calc) 284  eGFR (African American)  37  eGFR (Non-African American)  32 (eGFR values <30mL/min/1.73 m2 may be an indication of chronic kidney disease (CKD). Calculated eGFR is useful in patients with stable renal function. The eGFR calculation will not be reliable in acutely ill patients when serum creatinine is changing rapidly. It is not useful in  patients on dialysis. The eGFR calculation may not be applicable to patients at the low and high extremes of body sizes, pregnant women, and vegetarians.)   Radiology Results: XRay:    20-Feb-15 14:53, Chest Portable Single View  Chest Portable Single View   REASON FOR EXAM:    Chest Pain  COMMENTS:       PROCEDURE: DXR - DXR PORTABLE CHEST SINGLE VIEW  - Apr 08 2013  2:53PM     CLINICAL DATA:  Shortness of breath for 2 days, progressively  worsening    EXAM:  PORTABLE CHEST - 1 VIEW    COMPARISON:  DG CHEST 1V dated 12/14/2011; DG CHEST 2V dated  11/17/2011    FINDINGS:  Grossly unchanged enlarged cardiac silhouette and mediastinal  contours post median sternotomy and CABG. Atherosclerotic plaque  within the thoracic aorta. The pulmonary vasculature is indistinct  with cephalization of flow. Interval development small bilateral  effusions and associated bilateral mid  and lower lung heterogeneous  airspace opacities. No definite pneumothorax. Grossly unchanged  bones including sequela of old/remote left-sided surgical neck  humerus fracture with persistent deformity. Vascular calcifications  overlie the medial aspect of the right lung apex.     IMPRESSION:  Findings compatible with pulmonary edema, small bilateral effusions  and bibasilar opacities, atelectasis versus infiltrate.    Electronically Signed    By: Sandi Mariscal M.D.    On: 04/08/2013 15:04         Verified By: Aileen Fass, M.D.,    Penicillin: Rash  Iodine Strong: Rash  IVP Dye: Other  Valium: Other  Vital Signs/Nurse's Notes: **Vital Signs.:   21-Feb-15 12:13  Vital Signs Type Routine  Temperature Temperature (F) 98.6  Celsius 37  Pulse Pulse 50  Respirations Respirations 16  Systolic BP Systolic BP 563  Diastolic BP (mmHg) Diastolic BP (mmHg) 62  Mean BP 82  Pulse Ox % Pulse Ox % 96  Pulse Ox Activity Level  At rest  Oxygen Delivery 5L    Impression 81 year old Caucasian female with known history of coronary artery disease, status post CABG in 1988, multiple PCI done on vein grafts, cardiac catheterization done in 2012, She also has known history of peripheral arterial disease, followed conservatively by Dr. Doren Custard. She does not have good conduits for bypass,  known paroxysmal atrial fibrillation maintained in sinus rhythm with amiodarone, presenting with SOB. Cardiology was consulted for acute on chronic diastolic and systolic CHF.  1) Acute respiratory distress Suspect from Acute on Chronic systolic and diastolic CHF Unable to exclude pulmonary pathology came on suddenly yesterday, mild leg edema, ABD fullness, SOB Good diuresis on lasix IV, though creatinine started to climb. Will decrease lasix IV down to 20 mg IV, from 40 mg IV Closely monitor renal function If no improvement with diuresis, will need to consider other causes for SOB (lung pathology,  amiodarone)  2) Paroxysmal atrial fib will keep on amiodarone 200 mg po BID hold metoprolol and diltiazem today, can restart in AM with lower doses as BP and heart rate tolerate  3) CAD, CABG chronic chest pain stable at this time  4) PVD: followed by Dr. Fletcher Anon , and by [facility] in the past   Plan . 3. AKI  possibly from over diuresis will decrease lasix down to 20 mg IV BID will need to closely m,onitor renal function If creatrinine climbs further, would hold lasix  4. DM cont lantus. ADA diet SSI  5. HLD statin 6. hypothyroid on synthroid and TSH nml   Electronic Signatures: Ida Rogue (MD)  (Signed 21-Feb-15 16:57)  Authored: General Aspect/Present Illness, History and Physical Exam, Review of System, Past Medical History, Health Issues, Home Medications, Labs, Radiology, Allergies, Vital Signs/Nurse's Notes, Impression/Plan   Last Updated: 21-Feb-15 16:57 by Ida Rogue (MD)

## 2014-06-10 NOTE — Consult Note (Signed)
Brief Consult Note: Diagnosis: Right distal femoral (periprosthetic) fracture.   Patient was seen by consultant.   Consult note dictated.   Orders entered.   Comments: Recommend ORIF of the right distal femur fracture  The risks and benefits of surgical intervention were discussed in detail with the patient. The patient expressed understanding of the risks and benefits and agreed with plans for surgery. I also spoke to the patient's family and reviewed the situation.  Surgical site signed as per "right site surgery" protocol.  Electronic Signatures: Donato HeinzHooten, James P (MD)  (Signed 28-May-15 20:20)  Authored: Brief Consult Note   Last Updated: 28-May-15 20:20 by Donato HeinzHooten, James P (MD)

## 2014-06-10 NOTE — Discharge Summary (Signed)
PATIENT NAME:  [name], [name] MR#:  [id number] DATE OF BIRTH:  [birth year]  DATE OF ADMISSION:  04/08/2013 DATE OF DISCHARGE:  04/13/2013  DISCHARGE DIAGNOSES: 1. Acute respiratory failure from congestive heart failure exacerbation now resolved.  2. Acute on chronic systolic heart failure with ejection fraction of 40% to 45%, well compensated now. 3. Chest pain, now resolved, likely from heart failure. 4.  Acute renal failure from congestive heart failure and/or overdiuresis, now improving.  5. Community acquired pneumonia, improving with antibiotics.   SECONDARY DIAGNOSES:  1. Severe coronary artery disease, status post coronary artery bypass graft.  2. Chronic systolic heart failure with ejection fraction of 40% to 45%.  3. Hypertension.  4. Diabetes.  5. Hyperlipidemia.  6. Paroxysmal atrial fibrillation. 7. Severe peripheral vascular disease.  8. Hypothyroidism.  9. Lymphoma.   CONSULTATION:  1. Cardiology, Dr. Julien Nordmannimothy Gollan.  2. Physical therapy.   PROCEDURES AND RADIOLOGY: Chest x-ray on the 25th of February showed improved aeration at the lung bases. Small to moderate bilateral pleural effusion. Chronic left proximal humerus fracture.   Chest x-ray on 20th February showed findings compatible with pulmonary edema, small bilateral effusion and bibasilar opacities, atelectasis versus infiltrate.   CT scan of the chest without contrast on the 22nd of February showed bilateral pleural effusion with overlying airspace consolidation.  No evidence of PE. Scattered ground glass attenuating nodules in the right lung likely post inflammatory or infectious in etiology. Possible, reactive right paratracheal lymph node.   MAJOR LABORATORY PANEL: Urinalysis on admission showed 1+ bacteria, 5 WBCs, otherwise negative.   HISTORY AND SHORT HOSPITAL COURSE: The patient is [name] 81 year old female with above mentioned medical problem, was admitted for shortness of breath, mainly on exertion.  Please see Dr. Camillia HerterMody's dictated history and physical for further details. The patient was found to have acute on chronic respiratory failure, likely from CHF exacerbation and/or pneumonia. Cardiology consultation was obtained with Dr. Mariah MillingGollan, who recommended diuresis as ordered and adjustment were made while in the hospital based on the renal function and the patient's fluid balance. The patient was slowly improving and was close to her baseline on the 25th of February and was discharged home in stable condition.   PERTINENT PHYSICAL EXAMINATION ON THE DATE OF DISCHARGE:  VITAL SIGNS: On the date of discharge, her vital signs are as follows: Temperature 98.3, heart rate 76 per minute, respirations 20 per minute, blood pressure 129/60 mmHg. She was saturating 92% on room air.  CARDIOVASCULAR: S1, S2 normal. No murmurs, rubs, gallops.  LUNGS: Clear to auscultation bilaterally. No wheezing, rales, rhonchi, or crepitation.  ABDOMEN: Soft, benign.  NEUROLOGIC: Nonfocal examination. All other physical examination remained at baseline.   DISCHARGE MEDICATIONS: 1. Ranexa 500 mg p.o. daily.  2. Pacerone  200 mg p.o. b.i.d.  3. Gabapentin 400 mg p.o. 3 times [name] day.  4. Pravastatin 40 mg p.o. at bedtime.  5. Aspirin 81 mg p.o. daily.  6. Pletal 50 mg p.o. b.i.d.  7. Potassium chloride 10 mEq p.o. daily.  8. Levothyroxine 100 mcg p.o. daily.  9. Benazepril 20 mg p.o. daily.  10. Imdur 90 mg p.o. daily.  11. Cardizem 240 mg p.o. daily.  12. Insulin Lantus 30 units subcutaneous at bedtime.  13. Humalog insulin 10 units subcutaneous 3 times [name] day.  14. Levofloxacin 750 mg p.o. daily for five days.  15. Coreg 3.125 mg p.o. b.i.d.  16. Torsemide 20 mg p.o. daily.   DISCHARGE DIET: Low-sodium, low-fat, low-cholesterol, 1800 ADA.  DISCHARGE ACTIVITY: As tolerated.   DISCHARGE INSTRUCTIONS AND FOLLOW-UP: The patient was instructed to follow up with her primary care physician, Dr. Loma Sender in 2 to 4  weeks. She will need follow-up with cardiology, Dr. Julien Nordmann in 1 to 2 weeks. She will get CBC and basic metabolic panel check on 2nd of March on Monday with results forwarded to her primary care physician, Dr. Julien Nordmann for electrolyte and kidney function monitoring.   TOTAL TIME DISCHARGING THIS PATIENT: 55 minutes.   She was set up to get home health nursing at home for heart failure management.  ____________________________ Terron Merfeld S. Sherryll Burger, MD vss:sg D: 04/18/2013 11:29:46 ET T: 04/18/2013 12:08:53 ET JOB#: [id number]  cc: Gerrod Maule S. Sherryll Burger, MD, <Dictator> Marcine Matar., MD Antonieta Iba, MD  Ellamae S[facility] MD ELECTRONICALLY SIGNED 04/20/2013 21:38

## 2014-06-10 NOTE — Consult Note (Signed)
PATIENT NAME:  Sara DemarkWILLIAMS, Suni A MR#:  [id number] DATE OF BIRTH:  1931/05/16  DATE OF CONSULTATION:  07/14/2013  REFERRING PHYSICIAN:   CONSULTING PHYSICIAN:  Illene LabradorJames P. Angie FavaHooten Jr., MD  Addendum   MEDICATIONS:  At the time of admission, aspirin 81 mg daily, benazepril 20 mg daily, Coreg 0.125 mg b.i.d., Cardizem 40 mg p.o. daily, gabapentin 400 mg t.i.d., Humalog 10 units subQ t.i.d., Imdur 90 mg p.o. daily, insulin Lantus 30 units subQ at bedtime, Lasix 40 mg p.o. daily, levothyroxine 100 mcg p.o. daily, Plental 50 mg p.o. b.i.d., potassium chloride 10 mEq p.o. daily, pravastatin 40 mg p.o. at bedtime.  ____________________________ Illene LabradorJames P. Angie FavaHooten Jr., MD jph:dmm D: 07/14/2013 20:43:06 ET T: 07/14/2013 21:37:50 ET JOB#: [id number]  cc: Illene LabradorJames P. Angie FavaHooten Jr., MD, <Dictator> JAMES P Angie FavaHOOTEN JR MD ELECTRONICALLY SIGNED 07/16/2013 15:10

## 2014-06-10 NOTE — Consult Note (Signed)
General Aspect Primary Cardiologist: Dr. Fletcher Anon, MD ______________  81 year old female with history of CAD s/p 3 vessel CABG in 1988, multiple PCIs since, chronic systolic CHF EF 09-73% (06/3297), PAF on amiodarone 200 mg daily and Coreg 6.25 mg bid at home (not on anticoagulation 2/2 maintaining NSR), IDDM, HLD, CKD stage III, hypothyroidism, PAD, and GERD who presented to [facility] today with a one day history of chest heaviness and SOB that began around 3 AM this morning. Upon her arrival to the ED her first TnI was negative. EKG a-fib with RVR rate of 110, RBBB, baseline artifact, rare PVC, no obvious st/t changes. She received diltiazem 10 mg IV x1 and digoxin 0.25 mg IV x 1 with an improvement in rate to the upper 90s. Cardiology was consulted for further evaluation.  ______________  PMH: 1. CAD s/p 3 vessel CABG 1988 with multiple PCIs since 2. Chronic systolic CHF EF 24-26% (09/3417) 3. PAF 4. IDDM 5. HLD 6. CKD stage III 7. Hypothyroidism 8. PAD 9. GERD ______________   Present Illness 81 year old female with the above problem list who presented to [facility] today with a one day history of chest heaviness and SOB that began around 3 AM this morning. Upon her arrival to the ED her first TnI was negative. EKG a-fib with RVR rate of 110, RBBB, baseline artifact, rare PVC, no obvious st/t changes. She received diltiazem 10 mg IV x1 and digoxin 0.25 mg IV x 1 with an improvement in rate to the upper 90s.   Patient with known history of CAD and is s/p 3 vessel CABG in 1988. Multiple PCIs done on the SVGs since. Most recent cardiac catheterization in 2012 showed occluded native vessels with patent LIMA to LAD and SVG to OM. There was no patent graft to the RCA which was filling via left-to-right collaterals. There was diffuse disease in the distal LAD beyond the anastomosis. Ejection fraction was 45%. She has suffered from chronic angina. Most recent nuclear stress test in 2014 showed no evidence of  ischemia. Ejection fraction was 40%. There was evidence of prior inferior and lateral wall infarct. Recent echocardiogram in 03/2012 showed no significant valvular abnormalities but ejection fraction was 40-45%. She was recently hospitalized in May of 2015 for a right distal femor fracture and underwent successful surgical repair. She is doing well from this standpoint. She would like to have the left hip replaced at some point.   She reports her baseline weight as approximately 135. At her last office vist with Dr. Fletcher Anon her weight was 132. Today upon admission she weighs 137.9. She reports increased SOB and longstanding history of orthopnea. She also reports bilateral LEE this morning however per her report EMS did not note this upon their exam. She denies any frank chest pain during the episode that brought her to the hospital today and describes it as a chest pressure. Some associated chest heaviness. It orginally happened at 3 AM, she went back to sleep then she woke up again at 20 minutes to 5 AM with the chest heaviness. She took 2 SL NTG without relief of her symptoms. She advised her son they would need to call EMS. There was some associated nausea and SOB. No associated diaphoresis, vomiting, presyncope, or syncope.   Upon her arrival she was found to be in a-fib with RVR, rate of 110. She received diltiazem 10 mg IV x 1 and digoxin 0.25 mg IV x 1. Her rates have improved to the  upper 90s. Her first TnI has come back negative. EKG as above. SCr 1.40 (this appears to be about her baseline). It does not appear that she was kept on her home dose of amiodarone.   Physical Exam:  GEN well developed, well nourished, no acute distress   HEENT PERRL, hearing intact to voice, moist oral mucosa   RESP normal resp effort  crackles  bilateral crackles   CARD Irregular rate and rhythm  Normal, S1, S2  No murmur   ABD denies tenderness  no hernia  soft   EXTR negative edema   SKIN normal to palpation    NEURO cranial nerves intact   PSYCH alert, A+O to time, place, person, good insight   Review of Systems:  General: Fatigue   Skin: No Complaints   ENT: No Complaints   Eyes: No Complaints   Neck: No Complaints   Respiratory: Short of breath   Cardiovascular: Tightness  Dyspnea  Edema  chest heaviness   Gastrointestinal: Nausea   Genitourinary: No Complaints   Vascular: No Complaints   Musculoskeletal: No Complaints   Neurologic: No Complaints   Hematologic: No Complaints   Endocrine: No Complaints   Psychiatric: No Complaints   Review of Systems: All other systems were reviewed and found to be negative   Medications/Allergies Reviewed Medications/Allergies reviewed   Family & Social History:  Family and Social History:  Family History Mother: CAD, DM, HTN; Father: CAD; Brother: CAD   Social History negative ETOH, negative Illicit drugs   + Tobacco Prior (greater than 1 year)   Place of Living Home     Atrial Fibrillation:    lymphoma:    Oncology Protocol: [facility] 05/20/06   Diabetes:    MI - Myocardial Infarct:    HTN:    right hip replacedment:    right knee replacement:    Cholecystectomy:    Appendectomy:    Hysterectomy - Total:    CABG (Coronary Artery Bypass Graft):    Death of spouse: 2010/07/03  Home Medications: Medication Instructions Status  pantoprazole 40 mg oral delayed release tablet 1 tab(s) orally 2 times a day Active  aspirin 81 mg oral tablet, chewable 1 tab(s) orally once a day Active  benazepril 20 mg oral tablet 1 tab(s) orally once a day Active  Lasix 40 mg oral tablet 1 tab(s) orally once a day Active  carvedilol 6.25 mg oral tablet 1 tab(s) orally 2 times a day Active  Cartia XT 120 mg/24 hours oral capsule, extended release 1 cap(s) orally 2 times a day Active  NovoLOG FlexPen 100 units/mL subcutaneous solution 6 unit(s) subcutaneous 3 times a day (before meals) Active  isosorbide mononitrate  extended release 60 mg oral tablet, extended release 1.5 tab(s) orally once a day (in the morning) Active  levothyroxine 100 mcg (0.1 mg) oral tablet 1 tab(s) orally once a day Active  gabapentin 400 mg oral capsule 1 cap(s) orally 3 times a day Active  potassium chloride 10 mEq oral tablet, extended release 1 tab(s) orally once a day Active  pravastatin 40 mg oral tablet 1 tab(s) orally once a day (at bedtime) Active  Nitrostat 0.4 mg sublingual tablet 1 tab(s) sublingual every 5 minutes, As Needed Active  Lantus 100 units/mL subcutaneous solution 30 unit(s) subcutaneous once a day (at bedtime) Active   Lab Results:  Routine Chem:  04-Nov-15 11:17   Glucose, Serum  219  BUN  32  Creatinine (comp)  1.40  Sodium, Serum 139  Potassium, Serum 4.7  Chloride, Serum 103  CO2, Serum 30  Calcium (Total), Serum 8.9  Anion Gap  6  Osmolality (calc) 291  eGFR (African American)  47  eGFR (Non-African American)  38 (eGFR values <47mL/min/1.73 m2 may be an indication of chronic kidney disease (CKD). Calculated eGFR, using the MRDR Study equation, is useful in  patients with stable renal function. The eGFR calculation will not be reliable in acutely ill patients when serum creatinine is changing rapidly. It is not useful in patients on dialysis. The eGFR calculation may not be applicable to patients at the low and high extremes of body sizes, pregnant women, and vegetarians.)  Result Comment BUN/POTASSIUM/CREATININ - Slight hemolysis, interpret results with  - caution.  Result(s) reported on 21 Dec 2013 at 11:49AM.  B-Type Natriuretic Peptide [facility])  2325 (Result(s) reported on 21 Dec 2013 at 11:49AM.)  Cardiac:  04-Nov-15 11:17   Troponin I < 0.02 (0.00-0.05 0.05 ng/mL or less: NEGATIVE  Repeat testing in 3-6 hrs  if clinically indicated. >0.05 ng/mL: POTENTIAL  MYOCARDIAL INJURY. Repeat  testing in 3-6 hrs if  clinically indicated. NOTE: An increase or decrease  of 30% or more on  serial  testing suggests a  clinically important change)  CK, Total 72 (26-192 NOTE: NEW REFERENCE RANGE  03/21/2013)  CPK-MB, Serum 1.2 (Result(s) reported on 21 Dec 2013 at 01:02PM.)  Routine Coag:  04-Nov-15 11:17   Activated PTT (APTT) 27.2 (A HCT value >55% may artifactually increase the APTT. In one study, the increase was an average of 19%. Reference: "Effect on Routine and Special Coagulation Testing Values of Citrate Anticoagulant Adjustment in Patients with High HCT Values." American Journal of Clinical Pathology 2006;126:400-405.)  Prothrombin 12.4  INR 0.9 (INR reference interval applies to patients on anticoagulant therapy. A single INR therapeutic range for coumarins is not optimal for all indications; however, the suggested range for most indications is 2.0 - 3.0. Exceptions to the INR Reference Range may include: Prosthetic heart valves, acute myocardial infarction, prevention of myocardial infarction, and combinations of aspirin and anticoagulant. The need for a higher or lower target INR must be assessed individually. Reference: The Pharmacology and Management of the Vitamin K  antagonists: the seventh ACCP Conference on Antithrombotic and Thrombolytic Therapy. GDJME.2683 Sept:126 (3suppl): [id number]. A HCT value >55% may artifactually increase the PT.  In one study,  the increase was an average of 25%. Reference:  "Effect on Routine and Special Coagulation Testing Values of Citrate Anticoagulant Adjustment in Patients with High HCT Values." American Journal of Clinical Pathology 2006;126:400-405.)  Routine Hem:  04-Nov-15 11:17   WBC (CBC) 7.5  RBC (CBC) 3.82  Hemoglobin (CBC)  11.9  Hematocrit (CBC) 36.2  Platelet Count (CBC) 321 (Result(s) reported on 21 Dec 2013 at 11:39AM.)  MCV 95  MCH 31.2  MCHC 32.9  RDW  15.9   EKG:  EKG Interp. by me   Interpretation a-fib with RVR rate 110, RBBB, baseline artifact, rare PVC, no obvious st/t changes    Radiology Results: XRay:    04-Nov-15 11:37, Chest Portable Single View  Chest Portable Single View   REASON FOR EXAM:    Chest Pain  COMMENTS:       PROCEDURE: DXR - DXR PORTABLE CHEST SINGLE VIEW  - Dec 21 2013 11:37AM     CLINICAL DATA:  Chest pain since earlier today radiating down the  left arm; history of corner at bypass in 1989 with subset cardiac  stent placement; history  of diabetes    EXAM:  PORTABLE CHEST - 1 VIEW    COMPARISON:  Portable chest x-ray of July 19, 2013    FINDINGS:  The lungs are adequately inflated. There is no focal infiltrate. The  cardiopericardial silhouette is top-normal in size. The pulmonary  vascularity is not engorged. The pulmonary interstitium exhibits  mildly increased density, but this is much improved over the  previous study. The patient has undergone previous CABG. The bony  thorax exhibits no acute abnormality. There is chronic deformity of  the left humeral neck.     IMPRESSION:  Low-grade compensated CHF. There is no pulmonary edema nor  pneumonia.      Electronically Signed    By: David  Martinique    On: 12/21/2013 11:59         Verified By: DAVID A. Martinique, M.D., MD    Penicillin: Rash  Iodine Strong: Rash  IVP Dye: Other  Valium: Other  Vital Signs/Nurse's Notes: **Vital Signs.:   04-Nov-15 14:30  Vital Signs Type Admission  Temperature Temperature (F) 97.6  Celsius 36.4  Temperature Source axillary  Pulse Pulse 83  Respirations Respirations 18  Systolic BP Systolic BP 801  Diastolic BP (mmHg) Diastolic BP (mmHg) 83  Mean BP 105  Pulse Ox % Pulse Ox % 97  Pulse Ox Activity Level  At rest  Oxygen Delivery Room Air/ 21 %    Impression 82 year old female with history of CAD s/p 3 vessel CABG in 1988, multiple PCIs since, chronic systolic CHF EF 65-53% (08/4825), PAF on amiodarone 200 mg daily and Coreg 6.25 mg bid at home (not on anticoagulation 2/2 maintaining NSR), IDDM, HLD, CKD stage III, hypothyroidism,  PAD, and GERD who presented to [facility] today with a one day history of chest heaviness and SOB that began around 3 AM this morning. Upon her arrival to the ED her first TnI was negative. EKG a-fib with RVR rate of 110, RBBB, baseline artifact, rare PVC, no obvious st/t changes. She received diltiazem 10 mg IV x1 and digoxin 0.25 mg IV x 1 with an improvement in rate to the upper 90s. Cardiology was consulted for further evaluation.   1. Chest pain: -TnI negative x 1 so far, await final 2 TnI -Had a negative nuclear stress test 03/2012 -Check an echo to evaluate LV function  -If TnI negative can plan for Lexiscan Myoview in the AM to evaluate for high risk ischemia - though symptoms may be 2/2 her tachycardia  2. A-fib with RVR: -History of PAF previously in NSR and not on anticoagulant 2/2 to remaining in NSR -Has received diltiazem 10 mg IV x 1 and digoxin 0.25 mg x 1 IV so far with improvement in rates from 1-teens to upper 90s, remains in a-fib -Patient was on amiodarone 200 mg daily at home - will load her at 400 mg bid  -Coreg 6.25 mg bid -Stop diltiazem 2/2 on amiodarone usage -CHADSVASc of 7 giving her a 9.6% yearly risk of CVA -Start heparin at this time pending further work up will transition to outpatient NOAC likely as she does not have any valvular disease -Check TSH -Check Mg, K+ may have been hemolyzed will repeat -With the above medications and diuresis her HR will hopefully improve (she is yet to receive some)  3. Acute on chronic systolic CHF: -Dry weight at last outpatient office visit was 132 - currently 137.9 (home dose of Lasix is 40 mg daily) -Continue with IV Lasix 40  BID at this time with strict I&O monitoring along with daily weights -May be able to transition to PO Lasix 12/22/13 -Check echo -She has been pushing fluids at home, limit PO fluids and salts  4. CAD s/p 3 vessel CABG 1988 with multiple SVG PCI since: -Aspirin 81 mg -Pravastatin 40 mg -Coreg 6.25 mg  bid -Benazepril 20 mg -Last nuclear study 03/2012, negative  5. HLD:  -Check FLP -Currently on Pravastatin - may need to change to stronger statin  6. CKD stage III: -SCr appears to be at baseline -Monitor while diuresing  7. IDDM: -Per IM  8. Hypothyroidism:  -On Synthroid -TSH 6.95 on 06/2013 -Check TSH  9. PAD 10. GERD -Protonix   Electronic Signatures for Addendum Section:  Leonie Man (MD) (Signed Addendum [phone] 17:32)  I have seen & examined the patient this afternoon along with MR. Ramyah Pankowski, PA-C.  I have reviewed the available data & agree with his findings, exam & recommendations -- as per our discussion. She has long-standing Native vessel CAD s/p CABG with graft disease & PCI ---> las eval was 03/2012 with negative myoview.  She also had had a h/o PAF - was on Plavix (stoppped this summer) & remains on Amiodarone. She has Ischemic CM - with moderately reduced LVEF (~40-45%) & probably has chronic combined HF.  She now presents with A on C Combined HF & SSCP (Angina) associated with rapid / irregular heartbeas -- Rapid Afib.  Currently rate is better controlled & after rate control + NTG, she is feeling much better now. Probably needs a bit more diuresis, but mostly need resotration of NSR - agree with uptitrating amiodarone, IV Heparin pending Troponin levels  Myoview if Trop Neg, but if positive, cath. to evaluate for ischemic etiology. Will need long-term AC - await lab results to start to ensure no invasive procedures required.  Will know more by AM.  Dr. Fletcher Anon to see in Am.  DH   Electronic Signatures: Rise Mu (PA-C)  (Signed [phone] 17:07)  Authored: General Aspect/Present Illness, History and Physical Exam, Review of System, Family & Social History, Past Medical History, Home Medications, Labs, EKG , Radiology, Allergies, Vital Signs/Nurse's Notes, Impression/Plan Leonie Man (MD)  (Signed [phone] 17:32)  Co-Signer: General Aspect/Present  Illness, History and Physical Exam, Review of System, Family & Social History, Past Medical History, Home Medications, Labs, EKG , Radiology, Allergies, Vital Signs/Nurse's Notes, Impression/Plan   Last Updated: 04-Nov-15 17:32 by Leonie Man (MD)

## 2014-06-10 NOTE — Discharge Summary (Signed)
PATIENT NAME:  [name], [name] MR#:  [id number] DATE OF BIRTH:  1931/08/14  DATE OF ADMISSION:  12/22/2013 DATE OF DISCHARGE:  12/23/2013  ADMISSION DIAGNOSIS: Chest pain.   DISCHARGE DIAGNOSES: 1.  Chest pain, likely secondary to atrial fibrillation/rapid ventricular response.  2.  Atrial fibrillation/rapid ventricular response.  3.  Chronic systolic heart failure.  4.  Paroxysmal atrial fibrillation.  5.  History of diabetes without complication. 6.  History of hypothyroidism.  7.  History of hyperlipidemia.   CONSULTATIONS: Dr. Kirke CorinArida from [facility].   DIAGNOSTIC DATA: Stress test showed moderate risk scan with EF of 40% with moderate peri-infarct ischemia in the basal inferior territory.   [name] 2-D echocardiogram showed an EF of 45% to 50% with mild to moderate mitral valve regurgitation, moderate thickening and calcification of the anterior and posterior mitral valve leaflets, mild to moderate aortic valve sclerosis. No evidence of aortic stenosis.   Cardiac catheterization showed significant 3 vessel coronary artery disease, patent LIMA to LAD and SVG to OM. Known occluded SVG to RCA and occluded native RCA with left to right collaterals. No change since most recent cath.  Troponins x3 were negative.   Laboratories at discharge: Sodium 139, potassium 4.6, chloride 103, bicarb 29, BUN 40, creatinine 1.6, glucose 150. White blood cells 6.2, hemoglobin 10.9, hematocrit 33, platelets 255,000.    HOSPITAL COURSE: An 81 year old female who presented with chest pain, short of breath, found to have [name]-fib/RVR and an abnormal stress test during hospitalization. For further details, please refer to the H and P. 1.  Chest pain. The patient was admitted initially for chest pain. Troponins were negative. Telemetry showed normal sinus rhythm without any acute event. She underwent [name] stress test which was abnormal therefore leading to her cardiac catheterization, as mentioned above. Her  cardiac catheterization showed known occluded graft. At this time medical management was recommended. We suspect her chest pain was actually from her [name]-fib/RVR and not acute coronary syndrome.  2.  History of paroxysmal atrial fibrillation. She has high CHADS-VASc score. She was in normal sinus rhythm while in the hospital. She was started on amiodarone as per the recommendations of cardiology. I discussed the side effects,  alternatives, benefits and risks of anticoagulation with her and the family members including bleeding given she has almost [name] 10% per year chance of CVA given her elevated CHADS-VASc score. She was stared on amiodarone. She did want to start anticoagulation so she will start on Eliquis 2.5 p.o. b.i.d., which should be started tomorrow 1 day after cardiac catheterization. She will be on an amiodarone taper at 400 mg p.o. b.i.d. for [name] week and then 200 mg daily. She will continue on Coreg for heart rate control.  3.  Diabetes. Without complications. The patient will continue insulin, ADA diet.  4.  Hypothyroidism. On Synthroid. 5.  Hyperlipidemia. Continue atorvastatin.  6.  Chronic systolic heart failure. No overt signs of acute heart failure. She appears to be euvolemic. Her shortness of breath is likely secondary to her [name]-fib/RVR. The patient will continue on p.o. Lasix. Her lungs are clear to auscultation. At discharge, her chest x-ray did not demonstrate pulmonary edema.  7.  Acute kidney injury, possible from overdiuresis as she was started on IV Lasix on admission. She will need close follow-up holding any nephrotoxic agents.   DISCHARGE MEDICATIONS:  1.  For now she will hold benazepril 20 mg until followup with her primary care physician. 2.  Imdur 60 mg 1-1/2 tablets in  the morning.  3.  Nitroglycerin sublingual p.r.n.  4.  Eliquis 2.5 b.i.d.  5.  Amiodarone 400 mg b.i.d. for 7 days then 200 mg daily.  6.  Gabapentin 400 mg t.i.d.  7.  Lantus 30 units at bedtime.  8.   NovoLog 6 units t.i.d.  9.  Pravastatin 40 mg daily.  10.  Coreg 6.25 b.i.d.  11.  Lasix will be on hold until follow-up with cardiology as well on Monday and lab work for creatinine.  12.  Potassium chloride 10 mEq daily should be on hold as well. 13.  Pantoprazole 40 mg p.o. b.i.d.  14.  Synthroid 100 mcg daily.  NOTE: For now the patient will stop taking aspirin and Cartia.  DISCHARGE HOME HEALTH: With physical therapy, nurse and nurse aide. Resume PT and aide. Add nursing for cardiopulmonary assessment.   DISCHARGE DIET: Low sodium, regular diet.  DISCHARGE ACTIVITY: As tolerated. No exertion or heavy lifting for 1 week.   DISCHARGE FOLLOWUP: The patient will follow up on Monday with cardiology for [name] BMP and heart failure clinic in 1 to 2 weeks.  The patient is stable for discharge.   TIME SPENT: Approximately 45 minutes.   ____________________________ Janyth Contes. Juliene Pina, MD spm:sb D: 12/23/2013 11:01:56 ET T: 12/23/2013 11:42:26 ET JOB#: [id number]  cc: Melis Trochez P. Juliene Pina, MD, <Dictator> Muhammad [name]. Kirke Corin, MD Janyth Contes Rachael Ferrie MD ELECTRONICALLY SIGNED 12/25/2013 21:10

## 2014-06-10 NOTE — Op Note (Signed)
PATIENT NAME:  [name], [name] MR#:  [id number] DATE OF BIRTH:  03/18/1931  DATE OF PROCEDURE:  07/15/2013  PREOPERATIVE DIAGNOSIS: Right distal periprosthetic femur fracture.   POSTOPERATIVE DIAGNOSIS: Right distal periprosthetic femur fracture.   PROCEDURE PERFORMED: Open reduction and internal fixation of right distal periprosthetic femur fracture.   SURGEON: Dr. Francesco SorJames [name].   ANESTHESIA: General.   ESTIMATED BLOOD LOSS: 75 mL.   FLUIDS REPLACED: 600 mL of crystalloid.   DRAINS: None.   IMPLANTS UTILIZED: Synthes 8-hole 4.5-mm LCP Condylar locking plate and eleven 5.0-mm locking screws.   INDICATIONS FOR SURGERY: The patient is [name] 81 year old female with [name] previous history of  [name] right total knee arthroplasty and right total hip arthroplasty. She apparently sustained [name] fall on the day of admission and was noted to have [name] comminuted grossly displaced fracture of the right distal femur just superior to the femoral implant. After discussion of the risks and benefits of surgical intervention, the patient expressed understanding of the risks, benefits, and agreed with plans for surgical intervention.   PROCEDURE IN DETAIL: The patient was brought into the operating room and then after adequate general anesthesia was achieved, the patient was placed on radiolucent fracture table. All bony prominences were well padded. The patient's right knee and leg were cleaned and prepped with alcohol and DuraPrep and draped in the usual sterile fashion. [name] "timeout" was performed as per usual protocol. [name] lateral longitudinal incision was made extending from just superior to the lateral joint line of the knee proximally. The IT band was split in line within the skin incision, and dissection was carried down to the lateral flare of the distal femur. Moderate hematoma was evacuated. Provisional reduction was performed and verified using the C-arm. An 8-hole 4.5-mm LCP Condylar plate was then advanced in [name]  retrograde fashion along the submuscular interval so as to lie along the cortex of the distal femur. Good initial position was appreciated and [name] 2.0-mm K wire was inserted first distally and then at the proximal-most hole for [name] provisional fixation of the plate. Again, good position was noted. [name] "whirlybird" device was used by drilling through the plate and carefully bringing the femoral shaft in closer contact with the plate. Again, good position was noted in AP and lateral planes using the C-arm. Four 5.0-mm locking screws were inserted proximally with good position noted. Next, six 5.0-mm locking screws were inserted through the condylar portion of the plate distally. Position was again visualized in both the AP and lateral planes. Acceptable alignment was appreciated. The wound was irrigated with copious amounts of normal saline with antibiotic solution. Good hemostasis was noted. The IT band was repaired using interrupted sutures of #1 Vicryl. The subcutaneous tissue was approximated in layers using first #0 Vicryl followed by 2-0 Vicryl. The skin was closed with skin staples. [name] well-padded dressing was applied followed by application of [name] range of motion brace.   The patient tolerated the procedure well. She was transported to the recovery room in stable condition.    ____________________________ Sara LabradorJames P. Angie FavaHooten [name]., MD jph:lt D: 07/16/2013 14:43:35 ET T: 07/17/2013 01:21:19 ET JOB#: [id number]  cc: Sara LabradorJames P. Angie FavaHooten [name]., MD, <Dictator> Sara P Angie FavaHOOTEN JR MD ELECTRONICALLY SIGNED 07/17/2013 8:53

## 2014-06-10 NOTE — H&P (Signed)
PATIENT NAME:  [name], [name] MR#:  [id number] DATE OF BIRTH:  [birth year]  DATE OF ADMISSION:  07/14/2013  PRIMARY CARE PHYSICIAN: Dr. Loma Sender  CARDIOLOGIST: [facility]  REQUESTING PHYSICIAN: Dr. Sharma Covert  CHIEF COMPLAINT: Right knee pain.  HISTORY OF PRESENT ILLNESS: The patient is a 81 year old female with a known history of hypertension, coronary artery disease, and diabetes who is being admitted for right distal femur fracture. The patient does have a tendency to fall backwards, fell twice this morning. First time on a bag of clothes and second time she fell on the floor, mainly on the right side. Started having severe right knee pain and was unable to get up. Had to call EMS who brought her down to the Emergency Department. While in the ED, she was found to have right distal femur fracture and is being admitted for further evaluation and management, likely surgery.  PAST MEDICAL HISTORY: 1.  Hypertension. 2.  Diabetes.  3.  Severe coronary artery disease, status post bypass.  4.  Chronic systolic heart failure with EF of 40% to 45%.  5.  Hyperlipidemia.  6.  Paroxysmal atrial fibrillation.  7.  Severe peripheral vascular disease.  8.  Hypothyroidism.  9.  Lymphoma.  ALLERGIES: PENICILLIN, IVP DYE, IODINE AND VALIUM.   PAST SURGICAL HISTORY:  1.  Right hip replacement. 2.  Right knee replacement. 3.  Cholecystectomy. 4.  Appendectomy.  5.  Hysterectomy.  6.  CABG.  SOCIAL HISTORY: No tobacco, alcohol or drug use. She lives with her daughter.   MEDICATIONS AT HOME: 1.  Aspirin 81 mg p.o. daily.  2.  Benazepril 20 mg p.o. daily.  3.  Coreg 0.125 mg p.o. b.i.d.  4.  Cardizem 40 mg p.o. daily. 5.  Gabapentin 400 mg p.o. 3 times a day. 6.  Humalog 10 units subcutaneous 3 times a day. 7.  Imdur 90 mg p.o. daily. 8.  Insulin Lantus 30 units subcutaneous at bedtime.  9.  Lasix 40 mg p.o. daily. 10.  Levothyroxine 100 mcg p.o. daily.  11.  Pletal 50 mg  p.o. b.i.d.  12.  Potassium chloride 10 mEq p.o. daily. 13.  Pravastatin 40 mg p.o. at bedtime.   REVIEW OF SYSTEMS:  CONSTITUTIONAL: No fever, fatigue, weakness.  EYES: No blurred or double vision.  ENT: No tinnitus, ear pain.  RESPIRATORY: No cough, wheezing, hemoptysis.  CARDIOVASCULAR: No chest pain, orthopnea, edema.  GASTROINTESTINAL: No nausea, vomiting, diarrhea. GENITOURINARY: No dysuria or hematuria.  ENDOCRINE: No polyuria or nocturia. HEMATOLOGIC: No anemia, easy bruising. SKIN: No rash or lesion.  MUSCULOSKELETAL: Pain in the right knee area.  NEUROLOGIC: No tingling, numbness, weakness.  PSYCHIATRIC: No history of anxiety or depression.   PHYSICAL EXAMINATION: VITAL SIGNS: Temperature 97, heart rate 72 per minute, respirations 20 per minute, blood pressure 175/59 mmHg, and she is saturating 96% on room air.  GENERAL: The patient is a 81 year old female lying in the bed in no acute distress.  EYES: Pupils equal, round, and reactive to light and accommodation. No scleral icterus. Extraocular muscles intact.  HENT: Head atraumatic, normocephalic. Oropharynx and nasopharynx clear. NECK: Supple. No jugular venous distention. No thyroid enlargement or tenderness. LUNGS: Clear to auscultation bilateral. No wheezing, rales, rhonchi or crepitation. CARDIOVASCULAR: S1, S2 normal. No murmurs, rubs, or gallops.  ABDOMEN: Soft, nontender, nondistended. Bowel sounds present. No organomegaly or masses.  EXTREMITIES: No pedal edema, cyanosis, clubbing. She does have right knee area tenderness.  NEUROLOGIC: Cranial nerves II through XII intact. Muscle  strength 5/5 in all extremities. Right leg I did not check due to tenderness around the right knee. Sensation intact. Gait not checked. PSYCHIATRIC: The patient is alert and oriented x3.  SKIN: No obvious rash, lesion, ulcer.   DIAGNOSTIC DATA: Laboratory: Normal BMP except BUN of 29, creatinine 1.68, blood sugar 108. First set of troponin  normal. CBC: Hemoglobin 9.7, hematocrit 29.2. Normal coagulation panel. UA showed positive nitrite, trace leukocyte esterase, 8 WBCs and 1+ bacteria.   Chest x-ray in the ED showed cardiomegaly with chronic bronchitic markings. No acute findings. DJD of the shoulders.   Right knee x-ray showed comminuted displaced and angulated fracture of the distal femur with fracture abutting the distal femoral component of total knee arthroplasty.   EKG showed no acute ST-T changes.   IMPRESSION AND PLAN: 1.  Preoperative medical clearance. The patient is medically clear for current surgery. She will be at moderate to high risk considering her advanced age and severe vascular disease along with coronary artery disease requiring bypass and heart failure. Will ask cardiology for clearance. She is not having any acute cardiac symptoms at this time. She remains at high risk for postoperative myocardial infarction and/or other medical complication. Vascular surgery did see her and cleared her. 2.  Urinary tract infection, based on urinalysis. Will get urine culture and sensitivity, start her on IV Levaquin. 3.  Diabetes mellitus. We will start on sliding scale insulin. Add basal insulin if needed depending on her blood sugar.  4.  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 Close to her baseline. Will monitor.  5.  Hypothyroidism. Will check TSH. Continue Synthroid.  6.  Hypertension. Will monitor blood pressure and resume her blood pressure medication.   CODE STATUS: FULL.  TOTAL TIME TAKING CARE OF THIS PATIENT: 55 minutes.  ____________________________ Ellamae SiaVipul S. Sherryll BurgerShah, MD vss:sb D: 07/14/2013 16:06:24 ET T: 07/14/2013 16:41:25 ET JOB#: [id number]  cc: Carmen Vallecillo S. Sherryll BurgerShah, MD, <Dictator> Marcine Matarharles W. Phillips Jr., MD Ellamae SiaVIPUL S [facility]uth[facility] MD ELECTRONICALLY SIGNED 07/15/2013 10:39

## 2014-06-10 NOTE — Discharge Summary (Signed)
PATIENT NAME:  [name],  MR#:  [id number] DATE OF BIRTH:  [birth year]  DATE OF ADMISSION:  07/14/2013 DATE OF DISCHARGE:  07/22/2013  PRIMARY CARE PHYSICIAN: Loma Sender, MD  FINAL DIAGNOSES: 1.  Right distal femur fracture, status post repair.  2.  Fluid overload, acute on chronic systolic congestive heart failure.  3.  Nausea and vomiting.  4.  Acute encephalopathy, delirium.  5.  Escherichia coli urinary tract infection.  6.  Diabetes.  7.  Acute on chronic kidney disease, stage III.  8.  Anemia.  9.  Hypertension.  10.  Hypothyroidism.   DISCHARGE MEDICATIONS:  Include benazepril 20 mg daily, Imdur 90 mg daily, Humalog 10 units subcutaneous injection 3 times a day before meals, pravastatin 40 mg at bedtime, potassium chloride 10 mEq daily, levothyroxine 100 mcg once a day, diltiazem 240 mg extended release 1 capsule daily, Coreg 3.125 mg twice a day, Lasix 40 mg daily, aspirin 81 mg daily, Lantus 20 units subcutaneous injection at bedtime, acetaminophen 500 mg 1 tablet every 4 hours as needed for pain or temperature, oxycodone 5 mg 1 tablet every 4 hours as needed for pain. Be very careful with this medication. The patient did have acute delirium after the procedure, probably secondary to pain medications. We have been using just Tylenol here in the hospital. Also, enoxaparin 30 mg subcutaneous injection every 24 hours until more ambulatory and Protonix 40 mg twice a day.   DISCHARGE DIET: Low sodium diet, carbohydrate controlled diet, regular consistency.   DISCHARGE ACTIVITY: As tolerated. Toe-touch weight-bearing on the right leg until improved status and cleared by orthopedics to do so.   DISCHARGE FOLLOWUP: Follow up with orthopedics and follow up in 1 to 2 days with doctor at rehab.   REASON FOR ADMISSION: The patient was admitted Jul 14, 2013 and discharged July 22, 2013. Came in with right knee pain. Medical clearance for the operation was done. The patient was  started on IV Levaquin for urinary tract infection.   LABORATORY AND RADIOLOGICAL DATA DURING THE HOSPITAL COURSE: Included a chest x-ray that showed cardiomegaly, chronic bronchitic. Follow-up, no acute findings.   Right knee showed displaced angulated fracture of the distal femur.  TSH 6.95. Troponin negative. Glucose 108, BUN 29, creatinine 1.68, sodium 138, potassium 4.5, chloride 107, CO2 28, calcium 8.9. INR 1.1. White blood cell count 7.1, hemoglobin and hematocrit 9.7 and 29.2, platelet count 259,000.   EKG showed sinus rhythm, first-degree AV block, right bundle branch block.   Urine culture grew out E. coli, pansensitive. Next troponin negative.   Chest x-ray on the 29th showed no active disease.  Postop knee x-ray showed ORIF distal right femur, superimposed on existing right total knee arthroplasty.   Creatinine on the 31st up to 2.09.   Chest x-ray on the 31st showed no active disease.   The patient was transfused 1 unit of blood on a hemoglobin of 8. Repeat urine culture negative.  June 2nd, chest x-ray showed stable cardiomegaly, increased central pulmonary vascular congestion.   Creatinine upon discharge, on June 5th, 1.26. Glucose 156.   HOSPITAL COURSE PER PROBLEM LIST:  1.  For the patient's right distal femur fracture, status post ORIF by Dr. Ernest Pine. This was done on Jul 15, 2013. Please see operative report for further details on operation. The patient is currently right toe-touch with ambulation with physical therapy. The patient will be transferred to rehab today. Be very careful with pain control. We actually stopped all pain medications  so I would be very hesitant on giving any pain medications here upon discharge, but orthopedic wrote a prescription for oxycodone.  2.  Fluid overload, acute on chronic systolic congestive heart failure. We stopped IV fluids. IV Lasix needed to be given, a couple doses. Oral Lasix restarted. The patient is on benazepril and Coreg.  Lungs are clear upon discharge. The patient is on oxygen at this point, but breathing okay. Can consider oxygen for pulse ox less than 88%.  3.  Nausea and vomiting. This did happen on the evening of July 20, 2013. In the morning on June 4th was very nauseated and that is the reason I did not send the patient out to rehab on June 4th. This has resolved.  4.  Acute encephalopathy and delirium postoperatively. Could be secondary to anesthesia, pain medications, E. coli UTI. This has cleared. We did stop all pain medications and all medications that can cause encephalopathy. Mental status is back to baseline.  5.  E. coli UTI. Treated completely with Levaquin.  6.  Diabetes. On Lantus and sliding scale here. Can go back on standing dose Humalog since eating at this point.  7.  Acute on chronic kidney disease, stage III. The patient did have acute renal failure on top of her chronic kidney disease. This had improved. Creatinine is now down to 1.26 upon discharge.  8.  Anemia. The patient was transfused 1 unit of blood on a hemoglobin of 8. Hemoglobin upon discharge, 8.7. 9.  Hypertension. Blood pressure is stable.  10.  Hypothyroidism. The patient is on levothyroxine.  TIME SPENT ON DISCHARGE: 40 minutes.  ____________________________ Herschell Dimesichard J. Renae GlossWieting, MD rjw:sb D: 07/22/2013 09:09:47 ET T: 07/22/2013 09:36:00 ET JOB#: [id number]  cc: Herschell Dimesichard J. Renae GlossWieting, MD, <Dictator> Marcine Matarharles W. Phillips Jr., MD To rehab at discharge  Salley ScarletICHARD J Hosam Mcfetridge MD ELECTRONICALLY SIGNED 07/26/2013 10:19

## 2014-06-10 NOTE — Consult Note (Signed)
CHIEF COMPLAINT and HISTORY:  Subjective/Chief Complaint leg pain   History of Present Illness Patient is a 81 yo WF who fell today and her leg is painful and deformed.  She has previously had a knee replacement several years ago and has a fracture of her femur just above the knee replacement.  Has a long history of extensive vascular disease treated in [place] several years ago.  No follow up recently.  Has a stent on the xray that is visible and just above the fracture area and not deformed.  Has very extensive calcific disease above and below the area of the stent, and also has known severe disease on the left which has not been treated. Has no signs of acute ischemia to the foot on the right.   PAST MEDICAL/SURGICAL HISTORY:  Past Medical History:   lymphoma:    Oncology Protocol:    Diabetes:    MI - Myocardial Infarct:    HTN:    right hip replacedment:    right knee replacement:    Cholecystectomy:    Appendectomy:    Hysterectomy - Total:    CABG (Coronary Artery Bypass Graft):    Death of spouse: 2010-04-06  ALLERGIES:  Allergies:  Penicillin: Rash  Iodine Strong: Rash  IVP Dye: Other  Valium: Other  HOME MEDICATIONS:  Home Medications: Medication Instructions Status  diltiazem 240 mg/24 hours oral capsule, extended release 1 cap(s) orally once a day Active  levothyroxine 100 mcg (0.1 mg) oral tablet 1 tab(s) orally once a day Active  potassium chloride 10 mEq oral capsule, extended release 1 cap(s) orally once a day Active  Pletal 50 mg oral tablet 1 tab(s) orally 2 times a day Active  pravastatin 40 mg oral tablet 1 tab(s) orally once a day (at bedtime) Active  gabapentin capsule 400 mg 1 cap(s) orally 3 times a day Active  carvedilol 3.125 mg oral tablet 1 tab(s) orally 2 times a day Active  aspirin 81 mg oral tablet 1 tab(s) orally once a day Active  Lantus 100 units/mL subcutaneous solution 30 unit(s) subcutaneous once a day (at bedtime) Active  HumaLOG  100 units/mL subcutaneous solution 10 unit(s) subcutaneous 3 times a day (before meals) Active  benazepril 20 mg oral tablet 1 tab(s) orally once a day Active  Imdur 90 milligram(s) orally once a day Active  Lasix 40 mg oral tablet 1 tab(s) orally once a day Active   Family and Social History:  Family History Non-Contributory   Social History positive tobacco (Greater than 1 year), negative ETOH   Place of Living Home   Review of Systems:  Subjective/Chief Complaint leg pain   Fever/Chills No   Cough No   Sputum No   Abdominal Pain No   Diarrhea No   Constipation No   Nausea/Vomiting No   SOB/DOE No   Chest Pain No   Dysuria No   Tolerating PT No   Tolerating Diet Yes   Medications/Allergies Reviewed Medications/Allergies reviewed   Physical Exam:  GEN well developed, well nourished   HEENT hearing intact to voice, moist oral mucosa   NECK No masses  trachea midline   RESP normal resp effort  no use of accessory muscles   CARD regular rate  no JVD   VASCULAR ACCESS none   ABD denies tenderness  soft   GU no superpubic tenderness   LYMPH negative neck, negative axillae   EXTR right leg with clear deformity present.  Feet warm bilaterally with good  capillary refill.  She has a trace palpable right DP and a decent biphasic doppler signal in the right DP.  Right PT has a monophasic doppler signal.  Left foot with monophasic doppler signals.   SKIN normal to palpation, No ulcers, cap refill good   NEURO cranial nerves intact, right leg deformed from fracture   PSYCH alert, A+O to time, place, person   LABS:  Laboratory Results: Routine BB:    28-May-15 10:24, Type and Antibody Screen  ABO Group + Rh Type   O Positive  Antibody Screen POSITIVE  Result(s) reported on 14 Jul 2013 at 11:43AM.  Routine Chem:    28-May-15 88:41, Basic Metabolic Panel (w/Total Calcium)  Glucose, Serum 108  BUN 29  Creatinine (comp) 1.68  Sodium, Serum 138   Potassium, Serum 4.5  Chloride, Serum 107  CO2, Serum 28  Calcium (Total), Serum 8.9  Anion Gap 3  Osmolality (calc) 282  eGFR (African American) 33  eGFR (Non-African American) 29  eGFR values <39mL/min/1.73 m2 may be an indication of chronic  kidney disease (CKD).  Calculated eGFR is useful in patients with stable renal function.  The eGFR calculation will not be reliable in acutely ill patients  when serum creatinine is changing rapidly. It is not useful in   patients on dialysis. The eGFR calculation may not be applicable  to patients at the low and high extremes of body sizes, pregnant  women, and vegetarians.  Routine UA:    28-May-15 11:14, Urinalysis  Color (UA) Yellow  Clarity (UA) Clear  Glucose (UA) Negative  Bilirubin (UA) Negative  Ketones (UA) Negative  Specific Gravity (UA) 1.010  Blood (UA) 1+  pH (UA) 6.0  Protein (UA) 30 mg/dL  Nitrite (UA) Positive  Leukocyte Esterase (UA) Trace  Result(s) reported on 14 Jul 2013 at 12:04PM.  RBC (UA) 1 /HPF  WBC (UA) 8 /HPF  Bacteria (UA) 1+  Epithelial Cells (UA) 1 /HPF  Result(s) reported on 14 Jul 2013 at 12:04PM.  Routine Coag:    28-May-15 10:24, Activated PTT  Activated PTT (APTT) 29.0  A HCT value >55% may artifactually increase the APTT. In one study,  the increase was an average of 19%.  Reference: "Effect on Routine and Special Coagulation Testing Values  of Citrate Anticoagulant Adjustment in Patients with High HCT Values."  American Journal of Clinical Pathology 2006;126:400-405.    28-May-15 10:24, Prothrombin Time  Prothrombin 13.7  INR 1.1  INR reference interval applies to patients on anticoagulant therapy.  A single INR therapeutic range for coumarins is not optimal for all  indications; however, the suggested range for most indications is  2.0 - 3.0.  Exceptions to the INR Reference Range may include: Prosthetic heart  valves, acute myocardial infarction, prevention of myocardial  infarction,  and combinations of aspirin and anticoagulant. The need  for a higher or lower target INR must be assessed individually.  Reference: The Pharmacology and Management of the Vitamin K   antagonists: the seventh ACCP Conference on Antithrombotic and  Thrombolytic Therapy. YSAYT.0160 Sept:126 (3suppl): [id number].  A HCT value >55% may artifactually increase the PT.  In one study,   the increase was an average of 25%.  Reference:  "Effect on Routine and Special Coagulation Testing Values  of Citrate Anticoagulant Adjustment in Patients with High HCT Values."  American Journal of Clinical Pathology 2006;126:400-405.  Routine Hem:    28-May-15 10:24, CBC Profile  WBC (CBC) 7.1  RBC (CBC) 3.16  Hemoglobin (CBC) 9.7  Hematocrit (CBC) 29.2  Platelet Count (CBC) 259  MCV 92  MCH 30.8  MCHC 33.3  RDW 14.8  Neutrophil % 59.7  Lymphocyte % 22.0  Monocyte % 10.4  Eosinophil % 7.3  Basophil % 0.6  Neutrophil # 4.2  Lymphocyte # 1.6  Monocyte # 0.7  Eosinophil # 0.5  Basophil # 0.0  Result(s) reported on 14 Jul 2013 at 11:15AM.   RADIOLOGY:  Radiology Results: XRay:    20-Feb-15 14:53, Chest Portable Single View  Chest Portable Single View  REASON FOR EXAM:    Chest Pain  COMMENTS:       PROCEDURE: DXR - DXR PORTABLE CHEST SINGLE VIEW  - Apr 08 2013  2:53PM     CLINICAL DATA:  Shortness of breath for 2 days, progressively  worsening    EXAM:  PORTABLE CHEST - 1 VIEW    COMPARISON:  DG CHEST 1V dated 12/14/2011; DG CHEST 2V dated  11/17/2011    FINDINGS:  Grossly unchanged enlarged cardiac silhouette and mediastinal  contours post median sternotomy and CABG. Atherosclerotic plaque  within the thoracic aorta. The pulmonary vasculature is indistinct  with cephalization of flow. Interval development small bilateral  effusions and associated bilateral mid and lower lung heterogeneous  airspace opacities. No definite pneumothorax. Grossly unchanged  bones including sequela of  old/remote left-sided surgical neck  humerus fracture with persistent deformity. Vascular calcifications  overlie the medial aspect of the right lung apex.     IMPRESSION:  Findings compatible with pulmonary edema, small bilateral effusions  and bibasilar opacities, atelectasis versus infiltrate.    Electronically Signed    By: Sandi Mariscal M.D.    On: 04/08/2013 15:04         Verified By: Aileen Fass, M.D.,    25-Feb-15 08:16, Chest PA and Lateral  Chest PA and Lateral  REASON FOR EXAM:    sob  COMMENTS:       PROCEDURE: DXR - DXR CHEST PA (OR AP) AND LATERAL  - Apr 13 2013  8:16AM     CLINICAL DATA:  Shortness of breath.    EXAM:  CHEST  2 VIEW    COMPARISON:  CT CHEST W/O CM dated 04/10/2013; DG CHEST 1V PORT dated  04/08/2013; DG CHEST 1V dated 12/14/2011; NM MYOCARDIAL SCAN dated  03/24/2012; CT NECK-CHEST-ABD-PELV W/ CM dated 04/24/2008    FINDINGS:  Atherosclerotic aortic arch.  Mild cardiomegaly.  Prior CABG.    Left proximal humeral fracture, union uncertain. Improved aeration  in the lung bases. Small to moderate bilateral layering pleural  effusions. Thoracic spondylosis. Coronary atherosclerotic  calcification.     IMPRESSION:  1. Improved aeration at the lung bases.  No current edema.  2. Small to moderate bilateral pleural effusions.  3. Chronic left proximal humeral fracture common union on certain.      Electronically Signed    By: Sherryl Barters M.D.    On: 04/13/2013 08:24         Verified By: Carron Curie, M.D.,    28-May-15 10:11, Chest 1 View AP or PA  Chest 1 View AP or PA  REASON FOR EXAM:    femur fx  COMMENTS:       PROCEDURE: DXR - DXR CHEST 1 VIEWAP OR PA  - Jul 14 2013 10:11AM     CLINICAL DATA:  Fall    EXAM:  CHEST - 1 VIEW    COMPARISON:  04/13/2013    FINDINGS:  Sternotomy wires  overlie normal cardiac silhouette. History angles  are clear. No effusion, infiltrate, or pneumothorax. There is  chronic bronchitic  markings centrally. No acute osseous abnormality.  Degenerate changes of the shoulders.     IMPRESSION:  Cardiomegaly and chronic bronchitic markings.  No acute findings.    Degenerative changes of the shoulders.      Electronically Signed    By: Suzy Bouchard M.D.    On: 07/14/2013 10:18         Verified By: Rennis Golden, M.D.,    28-May-15 10:11, Knee Right Complete  Knee Right Complete  REASON FOR EXAM:    fall  COMMENTS:       PROCEDURE: DXR - DXR KNEE RT COMP WITH OBLIQUES  - Jul 14 2013 10:11AM     CLINICAL DATA:  Fall with right knee pain and injury.    EXAM:  RIGHT KNEE - COMPLETE 4+ VIEW    COMPARISON:  10/26/2005    FINDINGS:  Interval knee replacement surgery. There is evidence of a comminuted  fracture of the the distal femur with fracture planes extending to  abut the femoral component of the knee prosthesis. There is  significant displacement at the level of fracture as well as some  angulation in the lateral view. Fragments also lie close to the  proximal popliteal artery which is densely calcified. A distal SFA  stent is present. The tibial component appears normal and without  evidence fracture or abnormal lucency.     IMPRESSION:  Comminuted, displaced and angulated fracture of the distal femur  with fracture planes abutting the distal femoral component of a  total knee arthroplasty. Fracture fragments also like close in  proximity to the proximal popliteal artery which is densely  calcified.    Electronically Signed    By: Aletta Edouard M.D.    On: 07/14/2013 10:16         Verified By: Azzie Roup, M.D.,  LabUnknown:    20-Feb-15 14:53, Chest Portable Single View  PACS Image    [id number] 11:12, CT Chest Without Contrast  PACS Image    25-Feb-15 08:16, Chest PA and Lateral  PACS Image    28-May-15 10:11, Chest 1 View AP or PA  PACS Image    28-May-15 10:11, Knee Right Complete  PACS Image  CT:    22-Feb-15 11:12, CT  Chest Without Contrast  CT Chest Without Contrast  REASON FOR EXAM:    pulmonary fibrosis  COMMENTS:       PROCEDURE: CT  - CT CHEST WITHOUT CONTRAST  - Apr 10 2013 11:12AM     CLINICAL DATA:  Pulmonary fibrosis    EXAM:  CT CHEST WITHOUT CONTRAST    TECHNIQUE:  Multidetector CT imaging of the chest was performed following the  standard protocol without IV contrast.    COMPARISON:  04/08/2013  FINDINGS:  There are bilateral pleural effusions, right greater than left.  Overlying airspace consolidation is noted in both lower lobes, image  35/series2 and image 41/series 2. There are a few scattered sub  solid nodules within the right lung. The largest is in the anterior  right upper lobe measuring 1 cm, image 26/series 3.    The patient is status post median sternotomy and CABG procedure.  There is calcified atherosclerotic disease involving the thoracic  aorta. The heart size appears normal. There is no pericardial  effusion. Calcifications involving the mitral bowel noted. Enlarged  right paratracheal lymph node measures 1.1 cm, image  19/series 2. No  enlarged axillary or supraclavicular adenopathy. Incidental imaging  through the upper abdomen shows no acute findings. A left adrenal  myelolipoma is noted  Review of the visualized bony structures is significant for mild  multi level spondylosis. No aggressive lytic or sclerotic bone  lesions. .     IMPRESSION:  1. Bilateral pleural effusions with overlying airspace  consolidation.  2. No evidence for pulmonary fibrosis.  3. Scattered ground-glass attenuating nodules in the right lung are  likely post inflammatory or infectious in etiology. Followup imaging  in 3 months is recommended to confirm persistence. This  recommendation follows the consensus statement: Recommendations for  the Management of Subsolid Pulmonary Nodules Detected at CT: A  Statement from the [place]. Radiology 1021;117:356-701.  4. Enlarged  right paratracheal lymph node. This may be reactive in  etiology. Differential considerations include lymphoproliferative  disorder, granulomatous disease, an metastatic adenopathy. Attention  on followup imaging is advised.      Electronically Signed    By: Kerby Moors M.D.    On: 04/10/2013 14:29         Verified By: Angelita Ingles, M.D.,   ASSESSMENT AND PLAN:  Assessment/Admission Diagnosis right leg femur fracture severe PAD.  No obvious signs of acute worsening with her fracture.  Her foot is warm with good signals distally by doppler and no cyanosis.  Suspect she is at her baseline of PAD   Plan Has severe disease, and may need treatment at some point for this but does not have any signs of acute ischemia that need immediate intervention.  It is likely that her distal perfusion is based largely off collaterals, and this may be worsened after surgery.   Will have to follow and may need angiography and revascularization at some point in the future.  May also need treatment on her left leg at some point in the future as well depending on her symptoms going forward.   discussed serious nature of the situation with the patient and she voices understanding.      level 4   Electronic Signatures: Algernon Huxley (MD)  (Signed 28-May-15 12:28)  Authored: Chief Complaint and History, PAST MEDICAL/SURGICAL HISTORY, ALLERGIES, HOME MEDICATIONS, Family and Social History, Review of Systems, Physical Exam, LABS, RADIOLOGY, Assessment and Plan   Last Updated: 28-May-15 12:28 by Algernon Huxley (MD)

## 2014-06-11 NOTE — Discharge Summary (Signed)
PATIENT NAME:  [name], [name] MR#:  [id number] DATE OF BIRTH:  Jan 02, 1932  DATE OF ADMISSION:  07/16/2011 DATE OF DISCHARGE:  07/18/2011  PRIMARY CARE PHYSICIAN: Dr. Vear ClockPhillips.   CHIEF COMPLAINT: Syncope.   DISCHARGE DIAGNOSES:  1. Syncope, likely vasovagal and orthostatic hypotension. 2. Diabetes with hypoglycemic events. 3. Unintentional weight loss. 4. Urinary tract infection. 5. Nausea, vomiting, and diarrhea, likely viral gastroenteritis.   SECONDARY DIAGNOSES:  1. Hypertension.  2. History of atrial fibrillation. currently in sinus. 3. History of coronary artery disease status post coronary artery bypass graft. 4. Diabetes. 5. Hypertension   DISCHARGE MEDICATIONS:  1. Gabapentin 400 mg 3 times [name] day. 2. Metformin 500 mg 2 times [name] day.  3. Ranexa extended-release 1000 mg 1 tab daily.  4. Niacin 500 mg once [name] day. 5. Pravastatin 40 mg daily.  6. Plavix 75 mg daily. 7. TriCor 145 mg daily.  8. Lantus 30 units at bedtime.  9. Insulin aspart 10 units with each meal t.i.d. Do not take if you skip that meal.  10. Toprol-XL 50 mg daily.  11. Imdur 60 mg p.o. daily.  12. Diltiazem extended release 180 mg daily.  13. Please stop taking Lasix until seen by your doctor.   DIET: Low sodium, ADA diabetic diet.   ACTIVITY: As tolerated.   FOLLOWUP: Please follow-up with your primary care physician for your weight loss as well as your cardiologist within 1 to 2 weeks. If any further symptoms, call your primary care physician right away.  REFERRAL: Outpatient rehab.   DISPOSITION: Home.   HISTORY OF PRESENT ILLNESS: For full details of history and physical, please see the dictation on 07/13/2011 by Dr. Imogene Burnhen, but briefly, this is [name] 81 year old female with the above medical conditions who presented with [name] syncopal episode for about 10 minutes. No headache, dizziness, or seizure was reported. There was no chest pain and the patient was admitted to the hospitalist service for  further evaluation and management.   SIGNIFICANT LABS/IMAGING: BUN on arrival was 19, creatinine 1.39, glucose 219, potassium 3.4. Hemoglobin A1c was 8.3, HDL 19, LDL 106. Troponins were negative x3. AST 53, otherwise within normal limits in terms of the hepatic functional panel. WBC 7.9, hemoglobin 12.8. Urine cultures grew Escherichia coli sensitive to Cipro amongst other antibiotics. Urinalysis on arrival with nitrates and 3+ leukocyte esterase and 91 WBCs, 2+ bacteria. On the day of discharge, creatinine was 1.33. Echocardiogram on 07/14/2011 showed ejection fraction of 45 to 50%. Trace tricuspid regurgitation, mild mitral regurgitation. X-ray of the abdomen three-way including chest showed nonobstructive bowel gas pattern with moderate large amount of stool. Osteoarthritic changes. No acute cardiopulmonary disease. Ultrasound of the carotids did not show any hemodynamically significant stenosis.   HOSPITAL COURSE: The patient was admitted to the hospitalist service for the syncope. 1. Syncope. This was likely multifactorial. The patient likely had [name] vasovagal syncope in addition to having orthostatic hypotension. The patient had nausea and vomiting, and had [name] bout of heavy retching and passed out. She was also bradycardic with sinus to 50s. Syncopal work-up including remote telemetry observation, echocardiogram, and ultrasound of the carotids were done. The patient did have episodes of sinus bradycardia. However, she has been on calcium channel blocker and [name] beta blocker for rate control for her paroxysmal atrial fibrillation. Here, she was in sinus. Her beta blocker and Cardizem were decreased as above. Furthermore, the patient was tested for orthostatic hypotension and found to have severe orthostasis. This was likely in the  setting of volume depletion from gastrointestinal losses as well as medication effects. The patient has been on Lasix and on arrival, was not in [name] volume overload state and, if  anything, was on the dry side. Furthermore, she is on [name] rather high dose of Imdur for her coronary artery disease. The Lasix was held and Imdur was titrated down. She was started on IV fluids and her positional dizziness and orthostatic hypotension have mostly resolved. At this point, she is not symptomatic. She is to follow up with her primary care physician and primary cardiologist for further titration of Imdur, metoprolol XL and Cardizem as well as Lasix. Echocardiogram showed ejection fraction of 45 to 50% without significant valvular disease. She was also ruled out for acute coronary syndrome.  2. Nausea, vomiting, and diarrhea. This likely is viral gastroenteritis. There is no fever or leukocytosis and that resolved with symptomatic management.  3. Hypoglycemia. The patient is diabetic and was on Humulin N 50 units subcutaneous in the morning and 45 units in the evening as well as metformin. Those were held. On further investigations and discussion with the patient, it appears that the patient has had multiple episodes of hypoglycemia at least once weekly where she is symptomatic, waking up during the night. She apparently also has had unintentional weight loss. It is likely that her insulin requirement has decreased given the weight loss and perhaps she is overtreated. She was started on Lantus and before meals t.i.d. of aspart. She did have several bouts of hypoglycemia here and the Lantus was decreased and the before meals t.i.d. of aspart as well. Currently, she is on 10 units of aspart with every meal as well as 30 units of Lantus. She is to follow-up with her primary care physician for titration of her insulin regimens. She was told and is aware that if she skips [name] meal, she is not to take that dose of aspart coverage and have frequent snacks and above all have investigations for her unintentional weight loss.  4. Urinary tract infection. She was found to have Escherichia coli urinary tract infection  after [name] positive urinalysis here as well as some symptoms. She was started on Cipro and the urine cultures came back being sensitive to Cipro. She has had adequate treatment here and she will not be discharged on Cipro.  5. History of atrial fibrillation. Currently, she is in sinus and adequately rate controlled. As above, the Cardizem and Toprol XL doses have been decreased and her heart rates are from 50s to 70s. She should follow-up with her cardiologist. She is not on any anticoagulation for this. At this point, she was seen by physical therapy and the recommendation was outpatient physical therapy and she will be discharged with the above instructions and plan of care.   CODE STATUS: FULL CODE.       TOTAL TIME SPENT: 35 minutes.   ____________________________ Krystal Eaton, MD sa:ap D: 07/18/2011 10:23:30 ET T: 07/18/2011 12:08:41 ET JOB#: [id number]  cc: Krystal Eaton, MD, <Dictator> Marcine Matar., MD Krystal Eaton MD ELECTRONICALLY SIGNED 08/08/2011 19:58

## 2014-06-11 NOTE — H&P (Signed)
PATIENT NAME:  [name], [name] MR#:  [id number] DATE OF BIRTH:  03-27-1931  DATE OF ADMISSION:  07/13/2011  PRIMARY CARE PHYSICIAN: Dr. Vear ClockPhillips REFERRING PHYSICIAN:  Dr. Daphine DeutscherMartin    CHIEF COMPLAINT: Passed out today. In addition, patient has abdominal pain, nausea, vomiting   HISTORY OF PRESENT ILLNESS: 81 year old Caucasian female with [name] history of hypertension, diabetes, coronary artery disease status post CABG, atrial fibrillation presented to the ED with syncope and abdominal pain, nausea, vomiting today. Patient said she started to have diffuse abdominal pain about 6:00 [name].m. today where she woke up with abdominal pain, diffuse, constant, nonradiation, aching, about 7 to 10/10 associated with nausea, vomiting but no diarrhea. Patient then passed out for about 10 minutes according to patient's granddaughter. Patient denies any headache, dizziness, seizure. No chest pain, shortness of breath or incontinence   PAST MEDICAL HISTORY: As mentioned above:  1. Hypertension.  2. Diabetes.  3. Coronary artery disease. 4. Atrial fibrillation.  PAST SURGICAL HISTORY: 1. Coronary artery bypass graft.  2. Hysterectomy. 3. Appendectomy. 4. Total knee and hip replacement.   SOCIAL HISTORY: Denies any smoking, alcohol drinking or illicit drugs.   FAMILY HISTORY: Mother has diabetes, heart disease.    ALLERGIES: Iodine, IV dye and penicillin, Valium.   MEDICATIONS:  1. Diltiazem 240 mg p.o. daily. 2. Lasix 40 mg p.o. daily.  3. Lasix 20 mg p.o. p.r.n.  4. Gabapentin 400 mg p.o. t.i.d.  5. Humulin N 50 sub-Q in morning, 45 sub-Q in the evening.  6. Imdur 120 mg p.o. daily.  7. Metformin 500 mg p.o. b.i.d.  8. Niacin 500 mg p.o. daily.  9. Plavix 75 mg p.o. daily.  10. Potassium 20 mEq p.o. every 12 hours. 11. Pravastatin 40 mg p.o. at bedtime.  12. Ranexa extended-release 1000, 1 tablet p.o. daily.  13. Toprol-XL 100 mg p.o. daily.  14. TriCor 140 mg p.o. daily.    REVIEW OF SYSTEMS:  CONSTITUTIONAL: Patient denies any fever, chills. No headache or dizziness. No weakness. EYES: No double vision, blurred vision. ENT: No hearing loss, epistaxis, postnasal drip. No slurred speech or dysphagia. RESPIRATORY: No cough, sputum, shortness of breath, or hematemesis. CARDIOVASCULAR: No chest pain, palpitation, orthopnea, or nocturnal dyspnea. Patient complains of leg edema. GASTROINTESTINAL: Positive for diffuse abdominal pain, nausea, vomiting but no diarrhea, melena, bloody stool. No jaundice. GENITOURINARY: No dysuria, hematuria, or incontinence. ENDOCRINE: No polyuria, polydipsia. HEMATOLOGY: No easy bruising or bleeding. NEUROLOGY: Positive for syncope, loss of consciousness but no seizure.   PHYSICAL EXAMINATION:  VITALS: Temperature 98.4, blood pressure 130/66, pulse 90, oxygen saturation 93% on room air, respirations 18.   GENERAL: Patient is alert, awake, oriented in no acute distress.   HEENT: Pupils round, equal, reactive to light, accommodation. Moist oral mucosa. Clear oropharynx.   NECK: Supple. No JVD or carotid bruits. No lymphadenopathy. No thyromegaly.   CARDIOVASCULAR: S1, S2 regular rate, rhythm. No murmurs, gallops.   PULMONARY: Bilateral air entry. No wheezing, rales. No use of accessory muscles to breathe.   ABDOMEN: Soft. No distention but has diffuse tenderness. No rigidity or rebound. No obvious organomegaly.   EXTREMITIES: No edema, clubbing, or cyanosis. No calf tenderness. Strong bilateral pedal pulses.   NEUROLOGY: Alert and oriented x3. No focal deficits. Power 5/5. Sensation intact. Deep tendon reflexes 2+.   LABORATORY, DIAGNOSTIC AND RADIOLOGICAL DATA: CK 85, CK-MB 1.9, glucose 219, BUN 19, creatinine 1.39, potassium 3.4 CBC normal. Troponin less than 0.02. EKG showed normal sinus rhythm at 97 beats per  minute with right bundle branch block and left posterior fascicular block and also T wave abnormality. Chest x-ray, abdominal x-ray showed no acute  abnormality.   IMPRESSION:  1. Syncope possibly due to vasovagal reaction.  2. Abdominal pain, nausea, vomiting; etiology is unclear.  3. Mild dehydration.  4. Hypokalemia.  5. Coronary artery disease.  6. Hypertension.  7. Diabetes.   PLAN OF TREATMENT:  1. Patient will be placed for observation. We will continue telemetry monitor. Will get echocardiogram and carotid duplex.  2. Will give gentle rehydration and Zofran, morphine p.r.n.  3. For hypokalemia will give potassium and follow up BMP and magnesium level.  4. For diabetes will check hemoglobin A1c and start sliding scale, continue Humulin N.  5. GI and deep vein thrombosis prophylaxis.   Discussed the patient's situation and plan of treatment with patient and the patient's granddaughter.     TIME SPENT: About 60 minutes.   ____________________________ Shaune Pollack, MD qc:cms D: 07/13/2011 20:21:03 ET T: 07/14/2011 06:13:07 ET JOB#: [id number]  cc: Shaune Pollack, MD, <Dictator> Marcine Matar., MD Shaune Pollack MD ELECTRONICALLY SIGNED 07/14/2011 21:05

## 2014-06-14 ENCOUNTER — Ambulatory Visit
Admit: 2014-06-14 | Disposition: A | Discharge status: Home / Self Care | Payer: Self-pay | Attending: Vascular Surgery | Admitting: Vascular Surgery

## 2014-06-14 ENCOUNTER — Inpatient Hospital Stay
Admission: EM | Admit: 2014-06-14 | Discharge: 2014-06-18 | DRG: 281 | Disposition: A | Discharge status: Home / Self Care | Payer: Medicare Other | Attending: Internal Medicine | Admitting: Internal Medicine

## 2014-06-14 DIAGNOSIS — I5032 Chronic diastolic (congestive) heart failure: Secondary | ICD-10-CM | POA: Diagnosis present

## 2014-06-14 DIAGNOSIS — I739 Peripheral vascular disease, unspecified: Secondary | ICD-10-CM

## 2014-06-14 DIAGNOSIS — I2510 Atherosclerotic heart disease of native coronary artery without angina pectoris: Secondary | ICD-10-CM | POA: Diagnosis not present

## 2014-06-14 DIAGNOSIS — Z951 Presence of aortocoronary bypass graft: Secondary | ICD-10-CM

## 2014-06-14 DIAGNOSIS — Z791 Long term (current) use of non-steroidal anti-inflammatories (NSAID): Secondary | ICD-10-CM

## 2014-06-14 DIAGNOSIS — I10 Essential (primary) hypertension: Secondary | ICD-10-CM

## 2014-06-14 DIAGNOSIS — I480 Paroxysmal atrial fibrillation: Secondary | ICD-10-CM

## 2014-06-14 DIAGNOSIS — Z87891 Personal history of nicotine dependence: Secondary | ICD-10-CM

## 2014-06-14 DIAGNOSIS — Z7902 Long term (current) use of antithrombotics/antiplatelets: Secondary | ICD-10-CM

## 2014-06-14 DIAGNOSIS — I214 Non-ST elevation (NSTEMI) myocardial infarction: Secondary | ICD-10-CM | POA: Diagnosis present

## 2014-06-14 DIAGNOSIS — Z886 Allergy status to analgesic agent status: Secondary | ICD-10-CM | POA: Diagnosis not present

## 2014-06-14 DIAGNOSIS — Z79899 Other long term (current) drug therapy: Secondary | ICD-10-CM

## 2014-06-14 DIAGNOSIS — I252 Old myocardial infarction: Secondary | ICD-10-CM | POA: Diagnosis not present

## 2014-06-14 DIAGNOSIS — L97419 Non-pressure chronic ulcer of right heel and midfoot with unspecified severity: Secondary | ICD-10-CM | POA: Diagnosis present

## 2014-06-14 DIAGNOSIS — E1151 Type 2 diabetes mellitus with diabetic peripheral angiopathy without gangrene: Secondary | ICD-10-CM | POA: Diagnosis present

## 2014-06-14 DIAGNOSIS — E039 Hypothyroidism, unspecified: Secondary | ICD-10-CM | POA: Diagnosis present

## 2014-06-14 DIAGNOSIS — I313 Pericardial effusion (noninflammatory): Secondary | ICD-10-CM | POA: Diagnosis not present

## 2014-06-14 DIAGNOSIS — I5022 Chronic systolic (congestive) heart failure: Secondary | ICD-10-CM | POA: Diagnosis not present

## 2014-06-14 DIAGNOSIS — Z91041 Radiographic dye allergy status: Secondary | ICD-10-CM | POA: Diagnosis not present

## 2014-06-14 DIAGNOSIS — Z9049 Acquired absence of other specified parts of digestive tract: Secondary | ICD-10-CM | POA: Diagnosis present

## 2014-06-14 DIAGNOSIS — Z8249 Family history of ischemic heart disease and other diseases of the circulatory system: Secondary | ICD-10-CM

## 2014-06-14 DIAGNOSIS — I482 Chronic atrial fibrillation: Secondary | ICD-10-CM | POA: Diagnosis present

## 2014-06-14 DIAGNOSIS — K5792 Diverticulitis of intestine, part unspecified, without perforation or abscess without bleeding: Secondary | ICD-10-CM | POA: Diagnosis present

## 2014-06-14 DIAGNOSIS — J449 Chronic obstructive pulmonary disease, unspecified: Secondary | ICD-10-CM

## 2014-06-14 DIAGNOSIS — I129 Hypertensive chronic kidney disease with stage 1 through stage 4 chronic kidney disease, or unspecified chronic kidney disease: Secondary | ICD-10-CM | POA: Diagnosis present

## 2014-06-14 DIAGNOSIS — E1159 Type 2 diabetes mellitus with other circulatory complications: Secondary | ICD-10-CM

## 2014-06-14 DIAGNOSIS — E785 Hyperlipidemia, unspecified: Secondary | ICD-10-CM

## 2014-06-14 DIAGNOSIS — Z8261 Family history of arthritis: Secondary | ICD-10-CM | POA: Diagnosis not present

## 2014-06-14 DIAGNOSIS — Z794 Long term (current) use of insulin: Secondary | ICD-10-CM | POA: Diagnosis not present

## 2014-06-14 DIAGNOSIS — Z833 Family history of diabetes mellitus: Secondary | ICD-10-CM

## 2014-06-14 DIAGNOSIS — Z96641 Presence of right artificial hip joint: Secondary | ICD-10-CM | POA: Diagnosis present

## 2014-06-14 DIAGNOSIS — Z888 Allergy status to other drugs, medicaments and biological substances status: Secondary | ICD-10-CM

## 2014-06-14 DIAGNOSIS — N183 Chronic kidney disease, stage 3 (moderate): Secondary | ICD-10-CM | POA: Diagnosis present

## 2014-06-14 DIAGNOSIS — Z96651 Presence of right artificial knee joint: Secondary | ICD-10-CM | POA: Diagnosis present

## 2014-06-14 DIAGNOSIS — Z880 Allergy status to penicillin: Secondary | ICD-10-CM

## 2014-06-14 LAB — PROTIME-INR
INR: 1
Prothrombin Time: 13.7 secs

## 2014-06-14 LAB — URINALYSIS, COMPLETE
Bilirubin,UR: NEGATIVE
Glucose,UR: >=500 mg/dL (ref 0–75)
Ketone: NEGATIVE
Leukocyte Esterase: NEGATIVE
Nitrite: NEGATIVE
Ph: 5 (ref 4.5–8.0)
Protein: 30
SPECIFIC GRAVITY: 1.014 (ref 1.003–1.030)

## 2014-06-14 LAB — COMPREHENSIVE METABOLIC PANEL
ALBUMIN: 3.8 g/dL
ALK PHOS: 62 U/L
ANION GAP: 11 (ref 7–16)
BILIRUBIN TOTAL: 0.3 mg/dL
BUN: 37 mg/dL — ABNORMAL HIGH
CO2: 27 mmol/L
CREATININE: 1.57 mg/dL — AB
Calcium, Total: 9 mg/dL
Chloride: 99 mmol/L — ABNORMAL LOW
EGFR (Non-African Amer.): 31 — ABNORMAL LOW
GFR CALC AF AMER: 36 — AB
GLUCOSE: 405 mg/dL — AB
Potassium: 4.5 mmol/L
SGOT(AST): 28 U/L
SGPT (ALT): 13 U/L — ABNORMAL LOW
SODIUM: 137 mmol/L
Total Protein: 7.3 g/dL

## 2014-06-14 LAB — CBC
HCT: 32.8 % — ABNORMAL LOW (ref 35.0–47.0)
HGB: 10.7 g/dL — ABNORMAL LOW (ref 12.0–16.0)
MCH: 30.5 pg (ref 26.0–34.0)
MCHC: 32.6 g/dL (ref 32.0–36.0)
MCV: 93 fL (ref 80–100)
PLATELETS: 278 10*3/uL (ref 150–440)
RBC: 3.51 10*6/uL — ABNORMAL LOW (ref 3.80–5.20)
RDW: 15.4 % — ABNORMAL HIGH (ref 11.5–14.5)
WBC: 5.6 10*3/uL (ref 3.6–11.0)

## 2014-06-14 LAB — BASIC METABOLIC PANEL
ANION GAP: 8 (ref 7–16)
BUN: 41 mg/dL — AB
CALCIUM: 9.4 mg/dL
CHLORIDE: 101 mmol/L
Co2: 28 mmol/L
Creatinine: 1.75 mg/dL — ABNORMAL HIGH
EGFR (African American): 31 — ABNORMAL LOW
EGFR (Non-African Amer.): 27 — ABNORMAL LOW
GLUCOSE: 305 mg/dL — AB
Potassium: 5.1 mmol/L
Sodium: 137 mmol/L

## 2014-06-14 LAB — HEPARIN LEVEL (UNFRACTIONATED): Anti-Xa(Unfractionated): 0.14 IU/mL — ABNORMAL LOW (ref 0.30–0.70)

## 2014-06-14 LAB — TROPONIN I: TROPONIN-I: 0.11 ng/mL — AB

## 2014-06-14 LAB — APTT: ACTIVATED PTT: 28.3 s (ref 23.6–35.9)

## 2014-06-15 ENCOUNTER — Other Ambulatory Visit: Payer: Medicare Other

## 2014-06-15 DIAGNOSIS — I214 Non-ST elevation (NSTEMI) myocardial infarction: Secondary | ICD-10-CM

## 2014-06-15 DIAGNOSIS — I480 Paroxysmal atrial fibrillation: Secondary | ICD-10-CM

## 2014-06-15 DIAGNOSIS — I313 Pericardial effusion (noninflammatory): Secondary | ICD-10-CM

## 2014-06-15 DIAGNOSIS — I5022 Chronic systolic (congestive) heart failure: Secondary | ICD-10-CM

## 2014-06-15 LAB — TROPONIN I
TROPONIN-I: 1.25 ng/mL — AB
TROPONIN-I: 1.64 ng/mL — AB
TROPONIN-I: 1.5 ng/mL — AB

## 2014-06-15 LAB — CBC WITH DIFFERENTIAL/PLATELET
BASOS PCT: 0.1 %
Basophil #: 0 10*3/uL (ref 0.0–0.1)
EOS PCT: 0 %
Eosinophil #: 0 10*3/uL (ref 0.0–0.7)
HCT: 35.4 % (ref 35.0–47.0)
HGB: 11.5 g/dL — ABNORMAL LOW (ref 12.0–16.0)
Lymphocyte #: 1 10*3/uL (ref 1.0–3.6)
Lymphocyte %: 9.3 %
MCH: 30.4 pg (ref 26.0–34.0)
MCHC: 32.4 g/dL (ref 32.0–36.0)
MCV: 94 fL (ref 80–100)
Monocyte #: 0.6 x10 3/mm (ref 0.2–0.9)
Monocyte %: 5.5 %
NEUTROS PCT: 85.1 %
Neutrophil #: 9 10*3/uL — ABNORMAL HIGH (ref 1.4–6.5)
PLATELETS: 335 10*3/uL (ref 150–440)
RBC: 3.78 10*6/uL — AB (ref 3.80–5.20)
RDW: 15.2 % — ABNORMAL HIGH (ref 11.5–14.5)
WBC: 10.6 10*3/uL (ref 3.6–11.0)

## 2014-06-15 LAB — CK-MB
CK-MB: 11.2 ng/mL — AB
CK-MB: 10.6 ng/mL — AB
CK-MB: 9.5 ng/mL — ABNORMAL HIGH

## 2014-06-15 LAB — APTT
ACTIVATED PTT: 47.1 s — AB (ref 23.6–35.9)
Activated PTT: 55.2 secs — ABNORMAL HIGH (ref 23.6–35.9)

## 2014-06-15 LAB — PRO B NATRIURETIC PEPTIDE: B-TYPE NATIURETIC PEPTID: 978 pg/mL — AB

## 2014-06-16 DIAGNOSIS — I480 Paroxysmal atrial fibrillation: Secondary | ICD-10-CM

## 2014-06-16 DIAGNOSIS — I2510 Atherosclerotic heart disease of native coronary artery without angina pectoris: Secondary | ICD-10-CM

## 2014-06-16 LAB — CBC WITH DIFFERENTIAL/PLATELET
BASOS PCT: 0.1 %
Basophil #: 0 10*3/uL (ref 0.0–0.1)
EOS PCT: 0.1 %
Eosinophil #: 0 10*3/uL (ref 0.0–0.7)
HCT: 34.2 % — ABNORMAL LOW (ref 35.0–47.0)
HGB: 10.5 g/dL — AB (ref 12.0–16.0)
LYMPHS PCT: 6.6 %
Lymphocyte #: 0.9 10*3/uL — ABNORMAL LOW (ref 1.0–3.6)
MCH: 30.1 pg (ref 26.0–34.0)
MCHC: 30.6 g/dL — ABNORMAL LOW (ref 32.0–36.0)
MCV: 98 fL (ref 80–100)
MONOS PCT: 10.5 %
Monocyte #: 1.5 x10 3/mm — ABNORMAL HIGH (ref 0.2–0.9)
NEUTROS PCT: 82.7 %
Neutrophil #: 11.8 10*3/uL — ABNORMAL HIGH (ref 1.4–6.5)
PLATELETS: 265 10*3/uL (ref 150–440)
RBC: 3.49 10*6/uL — ABNORMAL LOW (ref 3.80–5.20)
RDW: 16.1 % — ABNORMAL HIGH (ref 11.5–14.5)
WBC: 14.3 10*3/uL — AB (ref 3.6–11.0)

## 2014-06-16 LAB — HEPARIN LEVEL (UNFRACTIONATED): ANTI-XA(UNFRACTIONATED): 0.59 [IU]/mL (ref 0.30–0.70)

## 2014-06-16 LAB — BASIC METABOLIC PANEL
Anion Gap: 11 (ref 7–16)
BUN: 40 mg/dL — ABNORMAL HIGH
CREATININE: 1.42 mg/dL — AB
Calcium, Total: 8.5 mg/dL — ABNORMAL LOW
Chloride: 104 mmol/L
Co2: 25 mmol/L
EGFR (Non-African Amer.): 35 — ABNORMAL LOW
GFR CALC AF AMER: 40 — AB
GLUCOSE: 278 mg/dL — AB
Potassium: 4.3 mmol/L
SODIUM: 140 mmol/L

## 2014-06-16 LAB — LIPID PANEL
Cholesterol: 129 mg/dL
HDL Cholesterol: 33 mg/dL — ABNORMAL LOW
LDL CHOLESTEROL, CALC: 55 mg/dL
TRIGLYCERIDES: 206 mg/dL — AB
VLDL Cholesterol, Calc: 41 mg/dL — ABNORMAL HIGH

## 2014-06-16 LAB — APTT: ACTIVATED PTT: 57.2 s — AB (ref 23.6–35.9)

## 2014-06-16 LAB — TROPONIN I: Troponin-I: 0.64 ng/mL — ABNORMAL HIGH

## 2014-06-17 DIAGNOSIS — I214 Non-ST elevation (NSTEMI) myocardial infarction: Secondary | ICD-10-CM | POA: Diagnosis present

## 2014-06-17 LAB — CBC WITH DIFFERENTIAL/PLATELET
Basophil #: 0 10*3/uL (ref 0.0–0.1)
Basophil %: 0.1 %
EOS PCT: 0 %
Eosinophil #: 0 10*3/uL (ref 0.0–0.7)
HCT: 28.6 % — AB (ref 35.0–47.0)
HGB: 9.5 g/dL — AB (ref 12.0–16.0)
Lymphocyte #: 0.6 10*3/uL — ABNORMAL LOW (ref 1.0–3.6)
Lymphocyte %: 9.3 %
MCH: 31.3 pg (ref 26.0–34.0)
MCHC: 33.3 g/dL (ref 32.0–36.0)
MCV: 94 fL (ref 80–100)
MONOS PCT: 6.7 %
Monocyte #: 0.4 x10 3/mm (ref 0.2–0.9)
Neutrophil #: 5.5 10*3/uL (ref 1.4–6.5)
Neutrophil %: 83.9 %
Platelet: 228 10*3/uL (ref 150–440)
RBC: 3.04 10*6/uL — ABNORMAL LOW (ref 3.80–5.20)
RDW: 15.4 % — ABNORMAL HIGH (ref 11.5–14.5)
WBC: 6.5 10*3/uL (ref 3.6–11.0)

## 2014-06-17 MED ORDER — ONDANSETRON HCL 4 MG/2ML IJ SOLN: 4.0000 mg | Freq: Four times a day (QID) | INTRAMUSCULAR | Status: DC | PRN

## 2014-06-17 MED ORDER — FUROSEMIDE 40 MG PO TABS: 40.0000 mg | ORAL_TABLET | Freq: Two times a day (BID) | ORAL | Status: DC

## 2014-06-17 MED ORDER — SIMVASTATIN 20 MG PO TABS: 20.0000 mg | ORAL_TABLET | Freq: Every day | ORAL | Status: DC

## 2014-06-17 MED ORDER — CIPROFLOXACIN HCL 500 MG PO TABS
500.0000 mg | ORAL_TABLET | Freq: Two times a day (BID) | ORAL | Status: DC
  Administered 2014-06-18: 500 mg via ORAL
  Filled 2014-06-17: qty 1

## 2014-06-17 MED ORDER — BENAZEPRIL HCL 20 MG PO TABS
20.0000 mg | ORAL_TABLET | Freq: Every morning | ORAL | Status: DC
  Administered 2014-06-18: 20 mg via ORAL
  Filled 2014-06-17: qty 1

## 2014-06-17 MED ORDER — CARVEDILOL 3.125 MG PO TABS
3.1250 mg | ORAL_TABLET | Freq: Two times a day (BID) | ORAL | Status: DC
  Administered 2014-06-18: 3.125 mg via ORAL
  Filled 2014-06-17: qty 1

## 2014-06-17 MED ORDER — ISOSORBIDE MONONITRATE ER 60 MG PO TB24
60.0000 mg | ORAL_TABLET | Freq: Every day | ORAL | Status: DC
  Administered 2014-06-18: 60 mg via ORAL
  Filled 2014-06-17: qty 1

## 2014-06-17 MED ORDER — LEVOTHYROXINE SODIUM 100 MCG PO TABS
100.0000 ug | ORAL_TABLET | ORAL | Status: DC
  Administered 2014-06-18: 100 ug via ORAL
  Filled 2014-06-17: qty 1

## 2014-06-17 MED ORDER — NITROGLYCERIN 0.4 MG SL SUBL: 0.4000 mg | SUBLINGUAL_TABLET | SUBLINGUAL | Status: DC | PRN

## 2014-06-17 MED ORDER — INSULIN GLARGINE 100 UNIT/ML ~~LOC~~ SOLN
10.0000 [IU] | SUBCUTANEOUS | Status: DC
  Filled 2014-06-17: qty 0.1

## 2014-06-17 MED ORDER — INSULIN ASPART 100 UNIT/ML ~~LOC~~ SOLN
0.0000 [IU] | Freq: Three times a day (TID) | SUBCUTANEOUS | Status: DC
  Administered 2014-06-18 (×2): 4 [IU] via SUBCUTANEOUS
  Filled 2014-06-17: qty 6
  Filled 2014-06-17: qty 4

## 2014-06-17 MED ORDER — PRAVASTATIN SODIUM 20 MG PO TABS: 20.0000 mg | ORAL_TABLET | Freq: Every day | ORAL | Status: DC

## 2014-06-17 MED ORDER — AMIODARONE HCL 200 MG PO TABS
200.0000 mg | ORAL_TABLET | Freq: Every day | ORAL | Status: DC
  Administered 2014-06-18: 200 mg via ORAL
  Filled 2014-06-17: qty 1

## 2014-06-17 MED ORDER — APIXABAN 2.5 MG PO TABS
2.5000 mg | ORAL_TABLET | Freq: Two times a day (BID) | ORAL | Status: DC
  Administered 2014-06-18: 2.5 mg via ORAL
  Filled 2014-06-17: qty 1

## 2014-06-17 MED ORDER — SODIUM CHLORIDE 0.9 % IJ SOLN
3.0000 mL | INTRAMUSCULAR | Status: DC | PRN
Start: 2014-06-18 — End: 2014-06-18

## 2014-06-17 MED ORDER — PANTOPRAZOLE SODIUM 40 MG PO TBEC
40.0000 mg | DELAYED_RELEASE_TABLET | Freq: Two times a day (BID) | ORAL | Status: DC
  Administered 2014-06-18: 40 mg via ORAL
  Filled 2014-06-17: qty 1

## 2014-06-17 MED ORDER — GABAPENTIN 400 MG PO CAPS
400.0000 mg | ORAL_CAPSULE | Freq: Three times a day (TID) | ORAL | Status: DC
  Administered 2014-06-18 (×2): 400 mg via ORAL
  Filled 2014-06-17 (×2): qty 1

## 2014-06-17 MED ORDER — HYDROCODONE-ACETAMINOPHEN 5-325 MG PO TABS
1.0000 | ORAL_TABLET | Freq: Four times a day (QID) | ORAL | Status: DC | PRN
  Administered 2014-06-18: 1 via ORAL
  Filled 2014-06-17: qty 1

## 2014-06-17 MED ORDER — METRONIDAZOLE 500 MG PO TABS
500.0000 mg | ORAL_TABLET | Freq: Three times a day (TID) | ORAL | Status: DC
  Administered 2014-06-18: 500 mg via ORAL
  Filled 2014-06-17: qty 1

## 2014-06-17 MED ORDER — ACETAMINOPHEN 325 MG PO TABS: 650.0000 mg | ORAL_TABLET | ORAL | Status: DC | PRN

## 2014-06-18 DIAGNOSIS — I214 Non-ST elevation (NSTEMI) myocardial infarction: Principal | ICD-10-CM

## 2014-06-18 LAB — BASIC METABOLIC PANEL
ANION GAP: 10 (ref 5–15)
BUN: 61 mg/dL — ABNORMAL HIGH (ref 6–20)
CALCIUM: 8.2 mg/dL — AB (ref 8.9–10.3)
CO2: 28 mmol/L (ref 22–32)
Chloride: 99 mmol/L — ABNORMAL LOW (ref 101–111)
Creatinine, Ser: 2 mg/dL — ABNORMAL HIGH (ref 0.44–1.00)
GFR, EST AFRICAN AMERICAN: 26 mL/min — AB (ref >60)
GFR, EST NON AFRICAN AMERICAN: 22 mL/min — AB (ref >60)
Glucose, Bld: 287 mg/dL — ABNORMAL HIGH (ref 65–99)
POTASSIUM: 4 mmol/L (ref 3.5–5.1)
SODIUM: 137 mmol/L (ref 135–145)

## 2014-06-18 LAB — GLUCOSE, CAPILLARY
GLUCOSE-CAPILLARY: 237 mg/dL — AB (ref 70–99)
Glucose-Capillary: 218 mg/dL — ABNORMAL HIGH (ref 70–99)

## 2014-06-18 MED ORDER — INSULIN GLARGINE 100 UNIT/ML ~~LOC~~ SOLN: 10.0000 [IU] | Freq: Every day | SUBCUTANEOUS | Status: DC

## 2014-06-18 MED ORDER — NITROGLYCERIN 0.4 MG SL SUBL: 0.4000 mg | SUBLINGUAL_TABLET | SUBLINGUAL | Status: AC | PRN

## 2014-06-18 MED ORDER — METRONIDAZOLE 500 MG PO TABS: 500.0000 mg | ORAL_TABLET | Freq: Three times a day (TID) | ORAL | Status: DC

## 2014-06-18 MED ORDER — CIPROFLOXACIN HCL 500 MG PO TABS: 500.0000 mg | ORAL_TABLET | Freq: Two times a day (BID) | ORAL | Status: DC

## 2014-06-18 MED ORDER — INSULIN LISPRO 100 UNIT/ML ~~LOC~~ SOLN: 4.0000 [IU] | Freq: Three times a day (TID) | SUBCUTANEOUS | Status: DC

## 2014-06-18 MED ORDER — ASPIRIN EC 81 MG PO TBEC: 81.0000 mg | DELAYED_RELEASE_TABLET | Freq: Every day | ORAL | Status: DC

## 2014-06-18 MED ORDER — NITROGLYCERIN 0.4 MG SL SUBL: 0.4000 mg | SUBLINGUAL_TABLET | SUBLINGUAL | Status: DC | PRN

## 2014-06-18 NOTE — Discharge Instructions (Signed)
Acute Coronary Syndrome  Acute coronary syndrome (ACS) is an urgent problem in which the blood and oxygen supply to the heart is critically deficient. ACS requires hospitalization because one or more coronary arteries may be blocked.  ACS represents a range of conditions including:  · Previous angina that is now unstable, lasts longer, happens at rest, or is more intense.  · A heart attack, with heart muscle cell injury and death.  There are three vital coronary arteries that supply the heart muscle with blood and oxygen so that it can pump blood effectively. If blockages to these arteries develop, blood flow to the heart muscle is reduced. If the heart does not get enough blood, angina may occur as the first warning sign.  SYMPTOMS   · The most common signs of angina include:  ¨ Tightness or squeezing in the chest.  ¨ Feeling of heaviness on the chest.  ¨ Discomfort in the arms, neck, back, or jaw.  ¨ Shortness of breath and nausea.  ¨ Cold, wet skin.  · Angina is usually brought on by physical effort or excitement which increase the oxygen needs of the heart. These states increase the blood flow needs of the heart beyond what can be delivered.  · Other symptoms that are not as common include:  ¨ Fatigue  ¨ Unexplained feelings of nervousness or anxiety  ¨ Weakness  ¨ Diarrhea  · Sometimes, you may not have noticed any symptoms at all but still suffered a cardiac injury.  TREATMENT   · Medicines to help discomfort may include nitroglycerin (nitro) in the form of tablets or a spray for rapid relief, or longer-acting forms such as cream, patches, or capsules. (Be aware that there are many side effects and possible interactions with other drugs).  · Other medicines may be used to help the heart pump better.  · Procedures to open blocked arteries including angioplasty or stent placement to keep the arteries open.  · Open heart surgery may be needed when there are many blockages or they are in critical locations that  are best treated with surgery.  HOME CARE INSTRUCTIONS   · Do not use any tobacco products including cigarettes, chewing tobacco, or electronic cigarettes.  · Take one baby or adult aspirin daily, if your health care provider advises. This helps reduce the risk of a heart attack.  · It is very important that you follow the angina treatment prescribed by your health care provider. Make arrangements for proper follow-up care.  · Eat a heart healthy diet with salt and fat restrictions as advised.  · Regular exercise is good for you as long as it does not cause discomfort. Do not begin any new type of exercise until you check with your health care provider.  · If you are overweight, you should lose weight.  · Try to maintain normal blood lipid levels.  · Keep your blood pressure under control as recommended by your health care provider.  · You should tell your health care provider right away about any increase in the severity or frequency of your chest discomfort or angina attacks. When you have angina, you should stop what you are doing and sit down. This may bring relief in 3 to 5 minutes. If your health care provider has prescribed nitro, take it as directed.  · If your health care provider has given you a follow-up appointment, it is very important to keep that appointment. Not keeping the appointment could result in a chronic or   permanent injury, pain, and disability. If there is any problem keeping the appointment, you must call back to this facility for assistance.  SEEK IMMEDIATE MEDICAL CARE IF:   · You develop nausea, vomiting, or shortness of breath.  · You feel faint, lightheaded, or pass out.  · Your chest discomfort gets worse.  · You are sweating or experience sudden profound fatigue.  · You do not get relief of your chest pain after 3 doses of nitro.  · Your discomfort lasts longer than 15 minutes.  MAKE SURE YOU:   · Understand these instructions.  · Will watch your condition.  · Will get help right  away if you are not doing well or get worse.  · Take all medicines as directed by your health care provider.  Document Released: 02/03/2005 Document Revised: 02/08/2013 Document Reviewed: 06/07/2013  [employer]® Patient Information ©2015 [employer]. This information is not intended to replace advice given to you by your health care provider. Make sure you discuss any questions you have with your health care provider.

## 2014-06-18 NOTE — Discharge Summary (Signed)
Physician Discharge Summary  [name] WUJ:[id number] DOB: 01-May-1931 DOA: 06/14/2014  PCP: Janeann ForehandHawkins  Jr,  James H, MD  Admit date: 06/14/2014 Discharge date: 06/18/2014  Time spent: 50 minutes  Recommendations for Outpatient Follow-up:  1. Dr. Kirke CorinArida cardiology in 2 weeks 2. Dr. Juanetta GoslingHawkins in 1-2 weeks 3. Home health RN and aide  Discharge Diagnoses:  Active Problems:   NSTEMI, initial episode of care   Discharge Condition: Stable  Diet recommendation: 1800 ADA 2 g sodium diet  Filed Weights   06/16/14 1256 06/18/14 0602  Weight: 66.407 kg (146 lb 6.4 oz) 65.499 kg (144 lb 6.4 oz)    History of present illness:  Patient presented with chest pain and shortness of breath.  Hospital Course:  #1 non-ST elevation myocardial infarction with continued chest pain. The patient was taken for a cardiac catheter by Dr. Kirke CorinArida which showed three-vessel disease no change from previous. Cardiology recommended medical management. The patient is on Coreg, imdur, aspirin, pravastatin. #2 chronic diastolic congestive heart failure. Lasix are restarted by cardiology. #3 diverticulitis, acute seen on CAT scan. I will finish up 4 more days of Cipro and Flagyl. #4 diabetes mellitus type 2 with peripheral vascular disease. Lantus dose decreased to 10 units. #5 hypertension essential continue usual medications. #6 hypothyroidism. Continue Synthroid #7 paroxysmal atrial fibrillation continue Coreg and amiodarone and eloquent restarted #8 chronic kidney disease stage III. Creatinine slightly elevated after restarting Lasix. Will hold Lasix today and restart tomorrow as outpatient.  Procedures:  Cardiac catheter by Dr. Kirke CorinArida  Consultations:  Dr. Kirke CorinArida cardiology  Discharge Exam: Filed Vitals:   06/18/14 0800  BP: 140/40  Pulse: 70  Temp: 97.7 F (36.5 C)  Resp: 20    Physical Exam  HENT:  Nose: No mucosal edema.  Mouth/Throat: No oropharyngeal exudate or posterior oropharyngeal  edema.  Eyes: Conjunctivae, EOM and lids are normal. Pupils are equal, round, and reactive to light.  Neck: No JVD present. Carotid bruit is not present. No edema present. No thyroid mass and no thyromegaly present.  Cardiovascular: S1 normal and S2 normal.  Exam reveals no gallop.   No murmur heard. Pulses:      Dorsalis pedis pulses are 1+ on the right side, and 1+ on the left side.  Respiratory: No respiratory distress. She has no wheezes. She has rhonchi in the right lower field and the left lower field. She has no rales.  GI: Soft. Bowel sounds are normal. There is no tenderness.  Lymphadenopathy:    She has no cervical adenopathy.  Neurological: She is alert. No cranial nerve deficit.  Skin: Skin is warm. No rash noted. Nails show no clubbing.  Psychiatric: She has a normal mood and affect.  Chronic skin lesion medial left lower extremity, no signs of infection.  Discharge Instructions   Discharge Instructions    Discharge patient    Complete by:  As directed           Current Discharge Medication List    START taking these medications   Details  aspirin EC 81 MG tablet Take 1 tablet (81 mg total) by mouth daily. Qty: 30 tablet, Refills: 0    ciprofloxacin (CIPRO) 500 MG tablet Take 1 tablet (500 mg total) by mouth every 12 (twelve) hours. Qty: 8 tablet, Refills: 0    metroNIDAZOLE (FLAGYL) 500 MG tablet Take 1 tablet (500 mg total) by mouth every 8 (eight) hours. Qty: 12 tablet, Refills: 0    nitroGLYCERIN (NITROSTAT) 0.4 MG SL  tablet Place 1 tablet (0.4 mg total) under the tongue every 5 (five) minutes as needed for chest pain. Qty: 30 tablet, Refills: 0      CONTINUE these medications which have CHANGED   Details  insulin glargine (LANTUS) 100 UNIT/ML injection Inject 0.1 mLs (10 Units total) into the skin at bedtime. Qty: 10 mL, Refills: 11    insulin lispro (HUMALOG) 100 UNIT/ML injection Inject 0.04 mLs (4 Units total) into the skin 3 (three) times daily  before meals. Qty: 10 mL, Refills: 11      CONTINUE these medications which have NOT CHANGED   Details  amiodarone (PACERONE) 200 MG tablet Take 1 tablet (200 mg total) by mouth daily. Qty: 90 tablet, Refills: 3    apixaban (ELIQUIS) 2.5 MG TABS tablet Take 1 tablet (2.5 mg total) by mouth 2 (two) times daily. Qty: 60 tablet, Refills: 6    benazepril (LOTENSIN) 20 MG tablet TAKE 1 TABLET (20 MG TOTAL) BY MOUTH DAILY. Qty: 30 tablet, Refills: 5    carvedilol (COREG) 3.125 MG tablet Take 1 tablet (3.125 mg total) by mouth 2 (two) times daily. Qty: 180 tablet, Refills: 3    furosemide (LASIX) 40 MG tablet Take 1 tablet (40 mg total) by mouth daily. Qty: 60 tablet, Refills: 6    gabapentin (NEURONTIN) 400 MG capsule Take 400 mg by mouth 3 (three) times daily.    HYDROcodone-acetaminophen (NORCO/VICODIN) 5-325 MG per tablet Take 2 tablets by mouth every 4 (four) hours as needed for moderate pain.    isosorbide mononitrate (IMDUR) 60 MG 24 hr tablet Take 1.5 mg by mouth daily.    levothyroxine (SYNTHROID, LEVOTHROID) 100 MCG tablet Take 100 mcg by mouth daily before breakfast.    potassium chloride (K-DUR) 10 MEQ tablet Take 10 mEq by mouth daily.     pravastatin (PRAVACHOL) 40 MG tablet Take 40 mg by mouth daily.    traMADol (ULTRAM) 50 MG tablet Take by mouth as needed.      STOP taking these medications     nitrofurantoin, macrocrystal-monohydrate, (MACROBID) 100 MG capsule        Allergies  Allergen Reactions  . Contrast Media [Iodinated Diagnostic Agents] Shortness Of Breath  . Tramadol     Nausea   . Valium [Diazepam] Other (See Comments)    Elevated heart rate  . Iodine Strong [Iodine] Rash  . Penicillins Rash      The results of significant diagnostics from this hospitalization (including imaging, microbiology, ancillary and laboratory) are listed below for reference.    Significant Diagnostic Studies: Ct Abdomen Pelvis Wo Contrast  06/14/2014   CLINICAL  DATA:  Acute left-sided pelvic pain after femoral artery catheterization.  EXAM: CT ABDOMEN AND PELVIS WITHOUT CONTRAST  TECHNIQUE: Multidetector CT imaging of the abdomen and pelvis was performed following the standard protocol without IV contrast.  COMPARISON:  CT scan of November 26, 2010.  FINDINGS: Severe multilevel degenerative disc disease is noted in the lumbar spine. Minimal bilateral posterior basilar subsegmental atelectasis is noted.  Status post cholecystectomy. The pancreas and spleen appear normal. Stable left adrenal myelolipoma. Right adrenal gland appears normal. No hydronephrosis or renal obstruction is noted. Urinary bladder is decompressed secondary to Foley catheter. Atherosclerosis of abdominal aorta is noted without aneurysm formation. There is no evidence of bowel obstruction. No abnormal fluid collection is noted. Status post right total hip arthroplasty. Severe degenerative joint disease of left hip is noted. Inflammatory changes are noted around the sigmoid colon consistent with focal  diverticulitis.  IMPRESSION: Findings consistent with focal sigmoid diverticulitis. No abscess is noted.  Stable left adrenal myelolipoma.  No hydronephrosis or renal obstruction is noted.   Electronically Signed   By: Lupita Raider, M.D.   On: 06/14/2014 14:42   Dg Chest Port 1 View  06/16/2014   CLINICAL DATA:  81 year old female with shortness of breath  EXAM: PORTABLE CHEST - 1 VIEW  COMPARISON:  Prior chest x-ray 06/14/2014  FINDINGS: Stable cardiomegaly. Atherosclerotic calcifications present in the transverse aorta. Patient is status post median sternotomy with evidence of multivessel CABG. Pulmonary vascular congestion with mild interstitial edema. Findings are similar in degree of severity compared to the chest x-ray from 2 days ago. Persistent left basilar opacity favored to reflect pleural effusion and atelectasis. No acute osseous abnormality.  IMPRESSION: Similar appearance of a CHF compared  to 06/14/2014.  Slightly increased density of left basilar opacity favored to reflect a combination of pleural fluid with atelectasis. Superimposed infiltrate is difficult to exclude.   Electronically Signed   By: Malachy Moan M.D.   On: 06/16/2014 01:01   Dg Chest Port 1 View  06/14/2014   CLINICAL DATA:  Central chest pain and shortness of breath. Clinical history includes CHF  EXAM: PORTABLE CHEST - 1 VIEW  COMPARISON:  Prior chest x-ray 04/21/2014  FINDINGS: Stable cardiomegaly. Atherosclerotic calcification present in the transverse aorta. Patient is status post median sternotomy with evidence of prior multivessel CABG. Interval worsening of pulmonary vascular congestion now with mild interstitial edema. Kerley B-lines are noted in the periphery of both lungs. Increased bibasilar atelectasis. No acute osseous abnormality.  IMPRESSION: Radiographic findings consistent with developing mild-moderate CHF.   Electronically Signed   By: Malachy Moan M.D.   On: 06/14/2014 20:00    Microbiology: No results found for this or any previous visit (from the past 240 hour(s)).   Labs: Basic Metabolic Panel:  Recent Labs Lab 06/14/14 0819 06/14/14 1950 06/16/14 0215 06/18/14 0335  NA  --   --   --  137  K  --   --   --  4.0  CL  --   --   --  99*  CO2 GLUCOSE  --   --   --  287*  BUN 41* 37* 40* 61*  CREATININE 1.75* 1.57* 1.42* 2.00*  CALCIUM 9.4 9.0 8.5* 8.2*   Liver Function Tests:  Recent Labs Lab 06/14/14 1950  AST 28  ALT 13*  ALKPHOS 62  PROT 7.3  ALBUMIN 3.8   CBC:  Recent Labs Lab 06/14/14 1950 06/15/14 0558 06/16/14 0215 06/17/14 0412  WBC 5.6 10.6 14.3* 6.5  NEUTROABS  --  9.0* 11.8* 5.5  HGB 10.7* 11.5* 10.5* 9.5*  HCT 32.8* 35.4 34.2* 28.6*  MCV 93 94 98 94  PLT 278 335 265 228    CBG:  Recent Labs Lab 06/18/14 0748  GLUCAP 218*       Signed:  Alford Highland  06/18/2014, 10:07 AM

## 2014-06-18 NOTE — Progress Notes (Signed)
Patient ID: SAGAL GAYTON, female   DOB: 12/12/1931, 81 y.o.   MRN: [id number]    Patient Name: [name] Date of Encounter: 06/18/2014     Active Problems:   NSTEMI, initial episode of care    SUBJECTIVE  "My hip still hurts" but denies chest pain or sob.   CURRENT MEDS . amiodarone  200 mg Oral Daily  . apixaban  2.5 mg Oral BID  . benazepril  20 mg Oral q morning - 10a  . carvedilol  3.125 mg Oral BID  . ciprofloxacin  500 mg Oral Q12H  . gabapentin  400 mg Oral TID  . insulin aspart  0-10 Units Subcutaneous TID WC & HS  . insulin glargine  10 Units Subcutaneous Q24H  . isosorbide mononitrate  60 mg Oral Daily  . levothyroxine  100 mcg Oral Q24H  . metroNIDAZOLE  500 mg Oral 3 times per day  . pantoprazole  40 mg Oral BID AC  . pravastatin  20 mg Oral QHS    OBJECTIVE  Filed Vitals:   06/16/14 1256 06/18/14 0602 06/18/14 0800  BP:  126/51 140/40  Pulse:  65 70  Temp:  98.1 F (36.7 C) 97.7 F (36.5 C)  TempSrc:  Oral Oral  Resp:  18 20  Height:  (1.549 m)    Weight: 146 lb 6.4 oz (66.407 kg) 144 lb 6.4 oz (65.499 kg)   SpO2:  93% 94%    Intake/Output Summary (Last 24 hours) at 06/18/14 1220 Last data filed at 06/18/14 0834  Gross per 24 hour  Intake    240 ml  Output    350 ml  Net   -110 ml   Filed Weights   06/16/14 1256 06/18/14 0602  Weight: 146 lb 6.4 oz (66.407 kg) 144 lb 6.4 oz (65.499 kg)    PHYSICAL EXAM  General: Pleasant, elderly woman, NAD. Neuro: Alert and oriented X 3. Moves all extremities spontaneously. Psych: Normal affect. HEENT:  Normal  Neck: Supple without bruits or JVD. Lungs:  Resp regular and unlabored, CTA. Heart: RRR no s3, s4, or murmurs. Abdomen: Soft, non-tender, non-distended, BS + x 4.  Extremities: No clubbing, cyanosis or edema. Decreased pulses bilaterally  Accessory Clinical Findings  CBC  Recent Labs  06/16/14 0215 06/17/14 0412  WBC 14.3* 6.5  NEUTROABS 11.8* 5.5  HGB 10.5* 9.5*    HCT 34.2* 28.6*  MCV 98 94  PLT 265 228   Basic Metabolic Panel  Recent Labs  06/16/14 0215 06/18/14 0335  NA  --  137  K  --  4.0  CL  --  99*  CO2 25 28  GLUCOSE  --  287*  BUN 40* 61*  CREATININE 1.42* 2.00*  CALCIUM 8.5* 8.2*   Liver Function Tests No results for input(s): AST, ALT, ALKPHOS, BILITOT, PROT, ALBUMIN in the last 72 hours. No results for input(s): LIPASE, AMYLASE in the last 72 hours. Cardiac Enzymes No results for input(s): CKTOTAL, CKMB, CKMBINDEX, TROPONINI in the last 72 hours. BNP Invalid input(s): POCBNP D-Dimer No results for input(s): DDIMER in the last 72 hours. Hemoglobin A1C No results for input(s): HGBA1C in the last 72 hours. Fasting Lipid Panel No results for input(s): CHOL, HDL, LDLCALC, TRIG, CHOLHDL, LDLDIRECT in the last 72 hours. Thyroid Function Tests No results for input(s): TSH, T4TOTAL, T3FREE, THYROIDAB in the last 72 hours.  Invalid input(s): FREET3  TELE  Atrial fib with a CVR  Radiology/Studies  Ct Abdomen Pelvis  Wo Contrast  06/14/2014   CLINICAL DATA:  Acute left-sided pelvic pain after femoral artery catheterization.  EXAM: CT ABDOMEN AND PELVIS WITHOUT CONTRAST  TECHNIQUE: Multidetector CT imaging of the abdomen and pelvis was performed following the standard protocol without IV contrast.  COMPARISON:  CT scan of November 26, 2010.  FINDINGS: Severe multilevel degenerative disc disease is noted in the lumbar spine. Minimal bilateral posterior basilar subsegmental atelectasis is noted.  Status post cholecystectomy. The pancreas and spleen appear normal. Stable left adrenal myelolipoma. Right adrenal gland appears normal. No hydronephrosis or renal obstruction is noted. Urinary bladder is decompressed secondary to Foley catheter. Atherosclerosis of abdominal aorta is noted without aneurysm formation. There is no evidence of bowel obstruction. No abnormal fluid collection is noted. Status post right total hip arthroplasty.  Severe degenerative joint disease of left hip is noted. Inflammatory changes are noted around the sigmoid colon consistent with focal diverticulitis.  IMPRESSION: Findings consistent with focal sigmoid diverticulitis. No abscess is noted.  Stable left adrenal myelolipoma.  No hydronephrosis or renal obstruction is noted.   Electronically Signed   By: Lupita RaiderJames  Green Jr, M.D.   On: 06/14/2014 14:42   Dg Chest Port 1 View  06/16/2014   CLINICAL DATA:  81 year old female with shortness of breath  EXAM: PORTABLE CHEST - 1 VIEW  COMPARISON:  Prior chest x-ray 06/14/2014  FINDINGS: Stable cardiomegaly. Atherosclerotic calcifications present in the transverse aorta. Patient is status post median sternotomy with evidence of multivessel CABG. Pulmonary vascular congestion with mild interstitial edema. Findings are similar in degree of severity compared to the chest x-ray from 2 days ago. Persistent left basilar opacity favored to reflect pleural effusion and atelectasis. No acute osseous abnormality.  IMPRESSION: Similar appearance of a CHF compared to 06/14/2014.  Slightly increased density of left basilar opacity favored to reflect a combination of pleural fluid with atelectasis. Superimposed infiltrate is difficult to exclude.   Electronically Signed   By: Malachy MoanHeath  McCullough M.D.   On: 06/16/2014 01:01   Dg Chest Port 1 View  06/14/2014   CLINICAL DATA:  Central chest pain and shortness of breath. Clinical history includes CHF  EXAM: PORTABLE CHEST - 1 VIEW  COMPARISON:  Prior chest x-ray 04/21/2014  FINDINGS: Stable cardiomegaly. Atherosclerotic calcification present in the transverse aorta. Patient is status post median sternotomy with evidence of prior multivessel CABG. Interval worsening of pulmonary vascular congestion now with mild interstitial edema. Kerley B-lines are noted in the periphery of both lungs. Increased bibasilar atelectasis. No acute osseous abnormality.  IMPRESSION: Radiographic findings consistent  with developing mild-moderate CHF.   Electronically Signed   By: Malachy MoanHeath  McCullough M.D.   On: 06/14/2014 20:00    ASSESSMENT AND PLAN  1. NSTEMI/Acute coronary syndrome - s/p left heart cath, results noted. Continue current meds. 2. Chronic atrial fib - her rate is controlled. Continue current meds. 3. Severe peripheral vascular disease - as per Dr. Kirke CorinArida, she will hold off on surgery for a couple of weeks after NSTEMI. 4. Disp. - note plans for discharge. She will follow up with Dr. Angus PalmsArida/Golan in our office.  Philana Younis,M.D.  06/18/2014 12:20 PM

## 2014-06-18 NOTE — Consult Note (Signed)
General Aspect Primary Cardiologist: Dr. Rockey Situ, MD _______________  81 year old female with history of CAD s/p 3 vessel CABG in 1988 s/p multiple PCI since, PAF on Eliquis, chronic systolic CHF, PVD, COPD, HTN, HLD, and DM2 who was admitted to [facility] for outpatient abdominal aortogram w/run-off. Upon arriving back home she took a nap x 3 hours and woke up with substernal chest pain.  ______________  PMH: 1. CAD s/p 3 vessel CABG in 1988 s/p multiple PCI since 2. PAF on Eliquis 3. Chronic systolic CHF 4. PVD 5. COPD 6. HTN 7. HLD 8. DM2 ________________   Present Illness 81 year old female with the above problem list who presented to [facility] on 4/27 for outpatient abdominal aortogram w/run-off.  Upon arriving back home she took a nap x 3 hours and woke up with substernal chest pain. She underwent 3 vessel (LIMA-LAD, SVG-OM, SVG-RCA) CABG in 1988. She had multiple PCI done on the SVGs. Most recent cardiac catheterization in November 2015 showed occluded native vessels with patent LIMA to LAD and SVG to OM. There was no patent graft to the RCA which was filling via left-to-right collaterals. There was diffuse disease in the distal LAD beyond the anastomosis. Ejection fraction was 45%. There was no significant change from previous cardiac catheterization in 2012. She also has extensive peripheral arterial disease which is being managed by Dr. Scot Dock. ?? She needs to have left hip surgery. This was postponed recently due to symptoms of fluid overload and mild hypoxia. Her dose of furosemide was increased to 40 mg once daily. The dose was further increased to 40 mg twice daily twice a week in order to improve fluid overload. At last follow up on 05/25/2014 she felt better but she still significantly bothered by her left hip.   She underwent outpatient abdominal aortogram w/run-off on 4/27.  Apparently significant calcifications were found and she will require surgery. She arrived back at her house and  took a nap. Upon waking up she developed severe substernal chest pain that did not radiate. Some associated nausea. No nausea, diaphoresis, palpiations, presyncope, or syncope. She presented to [facility] for further evaluation. She did take 3 SL NTG without help in symptoms. Upon her arrival to [facility] she was found to have troponin of 0.11-->1.25-->1.63, EKG showed NSR, 85 bpm, RBBB, 2 mm lateral, V4 st depression, 1 mm II st depression, TWI II, aVF. SCr 175-->1.57, hgb 10.7-->11.5, CXR with mild to moderate CHF, CT abdomen with focal sigmoid diverticulitis. She was placed on a nitro gtt in the ED and made chest pain free (nitro paste did not help). She arriving on the floor she has been chest pain free.   Physical Exam:  GEN well developed, no acute distress, obese   HEENT PERRL, hearing intact to voice, moist oral mucosa   NECK supple  trachea midline  no JVD   RESP normal resp effort  clear BS   CARD Regular rate and rhythm  Murmur   Murmur Systolic  2/6   ABD denies tenderness  soft   LYMPH negative neck   EXTR negative edema, ulcer left medial malleoli, poor healing wound posterior aspect right foot   SKIN normal to palpation   NEURO cranial nerves intact, motor/sensory function intact   PSYCH alert, A+O to time, place, person, good insight   Review of Systems:  Subjective/Chief Complaint chest pains   General: Fatigue  Weakness   Skin: ulcer/wound   ENT: No Complaints   Eyes: No Complaints  Neck: No Complaints   Respiratory: No Complaints   Cardiovascular: Chest pain or discomfort  Tightness   Gastrointestinal: Nausea   Genitourinary: No Complaints   Vascular: Calf pain with walking  Leg cramping   Musculoskeletal: Muscle or joint pain   Neurologic: No Complaints   Hematologic: No Complaints   Endocrine: No Complaints   Psychiatric: Nervousness   Review of Systems: All other systems were reviewed and found to be negative   Medications/Allergies Reviewed  Medications/Allergies reviewed   Family & Social History:  Family and Social History:  Family History Coronary Artery Disease  Hypertension  Diabetes Mellitus   Social History negative ETOH, negative Illicit drugs   + Tobacco Prior (greater than 1 year)  15 pack years   Place of Living Home     Multi-drug Resistant Organism (MDRO): Positive culture for ESBL organsim., 22-May-2014   Multi-drug Resistant Organism (MDRO): Positive culture for ESBL organsim., 11-Apr-2014   Atrial Fibrillation:    lymphoma:    Oncology Protocol: [facility] 05/20/06   Diabetes:    MI - Myocardial Infarct:    HTN:    right hip replacedment:    right knee replacement:    Cholecystectomy:    Appendectomy:    Hysterectomy - Total:    CABG (Coronary Artery Bypass Graft):    Death of spouse: 04/03/2010         Admit Diagnosis:   NSTEMI: Onset Date: 15-Jun-2014, Status: Active, Description: NSTEMI  Home Medications: Medication Instructions Status  pantoprazole 40 mg oral delayed release tablet 1 tab(s) orally 2 times a day Active  gabapentin 400 mg oral capsule 1 cap(s) orally 3 times a day Active  pravastatin 40 mg oral tablet 1 tab(s) orally once a day (at bedtime) Active  Eliquis 2.5 mg oral tablet 1 tab(s) orally 2 times a day Active  carvedilol 3.125 mg oral tablet 1 tab(s) orally 2 times a day Active  Lantus 100 units/mL subcutaneous solution 30 unit(s) subcutaneous once a day (at bedtime) Active  acetaminophen-HYDROcodone 325 mg-5 mg oral tablet 1-2 tab(s) orally every 6 hours, As Needed - for Pain Active  amiodarone 200 mg oral tablet 1 tab(s) orally once a day (in the morning) Active  benazepril 20 mg oral tablet 1 tab(s) orally once a day (in the morning) Active  levothyroxine 100 mcg (0.1 mg) oral tablet 1 tab(s) orally once a day (in the morning) Active  isosorbide mononitrate 60 mg oral tablet, extended release 1.5 tab(s) orally once a day (in the morning) Active   potassium chloride 10 mEq oral tablet, extended release 1 tab(s) orally once a day (in the morning) Active  Tylenol 650 mg oral tablet, extended release 1 tab(s) orally once a day (at bedtime), As Needed - for Pain Active   Lab Results:  Hepatic:  27-Apr-16 19:50   Bilirubin, Total 0.3 (0.3-1.2 NOTE: New Reference Range  04/25/14)  Alkaline Phosphatase 62 (38-126 NOTE: New Reference Range  04/25/14)  SGPT (ALT)  13 (14-54 NOTE: New Reference Range  04/25/14)  SGOT (AST) 28 (15-41 NOTE: New Reference Range  04/25/14)  Total Protein, Serum 7.3 (6.5-8.1 NOTE: New Reference Range  04/25/14)  Albumin, Serum 3.8 (3.5-5.0 NOTE: New reference range  04/25/14)  Routine Chem:  27-Apr-16 19:50   Result Comment - TROPONIN CALLED TO OLIVIA  - BROOMER AT 2040-04-03 06/14/2014 BY TFK  - READ-BACK PERFORMED  Result(s) reported on 14 Jun 2014 at 08:44PM.  Glucose, Serum  405 (65-99 NOTE: New Reference Range  04/25/14)  BUN  37 (6-20 NOTE: New Reference Range  04/25/14)  Creatinine (comp)  1.57 (0.44-1.00 NOTE: New Reference Range  04/25/14)  Sodium, Serum 137 (135-145 NOTE: New Reference Range  04/25/14)  Potassium, Serum 4.5 (3.5-5.1 NOTE: New Reference Range  04/25/14)  Chloride, Serum  99 (101-111 NOTE: New Reference Range  04/25/14)  CO2, Serum 27 (22-32 NOTE: New Reference Range  04/25/14)  Calcium (Total), Serum 9.0 (8.9-10.3 NOTE: New Reference Range  04/25/14)  eGFR (African American)  36  eGFR (Non-African American)  31 (eGFR values <33m/min/1.73 m2 may be an indication of chronic kidney disease (CKD). Calculated eGFR is useful in patients with stable renal function. The eGFR calculation will not be reliable in acutely ill patients when serum creatinine is changing rapidly. It is not useful in patients on dialysis. The eGFR calculation may not be applicable to patients at the low and high extremes of body sizes, pregnant women, and vegetarians.)  Anion Gap 11   Cardiac:  27-Apr-16 19:50   Troponin I  0.11 (0.00-0.03 0.03 ng/mL or less: NEGATIVE  Repeat testing in 3-6 hrs  if clinically indicated. >0.05 ng/mL: POTENTIAL  MYOCARDIAL INJURY. Repeat  testing in 3-6 hrs if  clinically indicated. NOTE: An increase or decrease  of 30% or more on serial  testing suggests a  clinically important change NOTE: New Reference Range  04/25/14)  28-Apr-16 05:58   Troponin I  1.25 (0.00-0.03 0.03 ng/mL or less: NEGATIVE  Repeat testing in 3-6 hrs  if clinically indicated. >0.05 ng/mL: POTENTIAL  MYOCARDIAL INJURY. Repeat  testing in 3-6 hrs if  clinically indicated. NOTE: An increase or decrease  of 30% or more on serial  testing suggests a  clinically important change NOTE: New Reference Range  04/25/14)    09:38   Troponin I  1.64 (0.00-0.03 0.03 ng/mL or less: NEGATIVE  Repeat testing in 3-6 hrs  if clinically indicated. >0.05 ng/mL: POTENTIAL  MYOCARDIAL INJURY. Repeat  testing in 3-6 hrs if  clinically indicated. NOTE: An increase or decrease  of 30% or more on serial  testing suggests a  clinically important change NOTE: New Reference Range  04/25/14)  Routine UA:  27-Apr-16 19:50   Color (UA) Straw  Clarity (UA) Clear  Glucose (UA) >=500  Bilirubin (UA) Negative  Ketones (UA) Negative  Specific Gravity (UA) 1.014  Blood (UA) 1+  pH (UA) 5.0  Protein (UA) 30 mg/dL  Nitrite (UA) Negative  Leukocyte Esterase (UA) Negative (Result(s) reported on 14 Jun 2014 at 08:40PM.)  RBC (UA) 0-5  WBC (UA) 0-5  Bacteria (UA) RARE  Epithelial Cells (UA) 0-5  Mucous (UA) PRESENT (Result(s) reported on 14 Jun 2014 at 08:40PM.)  Routine Hem:  27-Apr-16 19:50   WBC (CBC) 5.6  RBC (CBC)  3.51  Hemoglobin (CBC)  10.7  Hematocrit (CBC)  32.8  Platelet Count (CBC) 278 (Result(s) reported on 14 Jun 2014 at 08:19PM.)  MCV 93  MCH 30.5  MCHC 32.6  RDW  15.4  28-Apr-16 05:58   Hemoglobin (CBC)  11.5  Hemoglobin (CBC) -  Hematocrit  (CBC) 35.4  Hematocrit (CBC) -   EKG:  EKG Interp. by me   Interpretation EKG shows NSR, 85 bpm, RBBB, 2 mm lateral, V4 st depression, 1 mm II st depression, TWI II, aVF   Radiology Results:  XRay:    27-Apr-16 19:57, Chest Portable Single View  Chest Portable Single View   REASON FOR EXAM:    Chest pain  COMMENTS:  PROCEDURE: DXR - DXR PORTABLE CHEST SINGLE VIEW  - Jun 14 2014  7:57PM     CLINICAL DATA:  Central chest pain and shortness of breath. Clinical  history includes CHF    EXAM:  PORTABLE CHEST - 1 VIEW    COMPARISON:  Prior chest x-ray 04/21/2014    FINDINGS:  Stable cardiomegaly. Atherosclerotic calcification present in the  transverse aorta. Patient is status post median sternotomy with  evidence of prior multivessel CABG. Interval worsening of pulmonary  vascular congestion now with mild interstitial edema. Kerley B-lines  are noted in the periphery of both lungs. Increased bibasilar  atelectasis. No acute osseous abnormality.     IMPRESSION:  Radiographic findings consistent with developing mild-moderate CHF.      Electronically Signed    By: Jacqulynn Cadet M.D.    On: 06/14/2014 20:00       Verified By: Criselda Peaches, M.D.,  Cardiology:    28-Apr-16 09:07, Echo Doppler  Echo Doppler   REASON FOR EXAM:      COMMENTS:       PROCEDURE: [facility] - ECHO DOPPLER COMPLETE(TRANSTHOR)  - Jun 15 2014  9:07AM     RESULT: Echocardiogram Report    Patient Name:   [name] Date of Exam: 06/15/2014  Medical Rec #:  [id number]             Custom1:  Date of Birth:  1931/12/13          Height:       61.0 in  Patient Age:    89 years           Weight:       140.0 lb  Patient Gender: F                  BSA:          1.62 m??    Indications: MI  Sonographer:    Sherrie Sport RDCS  Referring Phys: Lance Coon, F    Summary:   1. Left ventricular ejection fraction, by visual estimation, is 50 to   55%.   2. Low normal global left ventricular  systolic function.   3. Mild inferior/posterior wall hypokinesis.   4. Mild left ventricular hypertrophy.   5. Normal right ventricular size and systolic function.   6. Mildly dilated left atrium.   7. Moderate mitral valve regurgitation.   8. Mild tricuspid regurgitation.   9. Moderately elevated pulmonary artery systolic pressure.  10. Small pericardial effusion with no hemodynamic compromise.  2D AND M-MODE MEASUREMENTS (normal ranges within parentheses):  Left Ventricle:         Normal  IVSd (2D):      1.87 cm (0.7-1.1)  LVPWd (2D):     1.29 cm (0.7-1.1) Aorta/LA:                 Normal  LVIDd (2D):    4.88 cm (3.4-5.7) Aortic Root (2D): 3.40 cm (2.4-3.7)  LVIDs (2D):     3.97 cm           Left Atrium (2D): 4.40 cm (1.9-4.0)  LV FS (2D):     18.6 %  (>25%)  LV EF (2D):     38.5 %  (>50%)                                    Right Ventricle:  RVd (2D):        4.54 cm  LV SYSTOLIC FUNCTION BY 2D PLANIMETRY (MOD):  EF-A4C View: 09.8 %  LV DIASTOLIC FUNCTION:  MV Peak E: 1.53 m/s E/e' Ratio: 23.80  MV Peak A: 0.78 m/s Decel Time: 194 msec  E/A Ratio: 1.95  SPECTRAL DOPPLER ANALYSIS (where applicable):  Mitral Valve:  MV P1/2 Time: 56.26 msec  MV Area, PHT: 3.91 cm??  Aortic Valve: AoV Max Vel: 1.10 m/s AoV Peak PG: 4.8 mmHg AoV Mean PG:  LVOT Vmax: 0.71 m/s LVOT VTI:  LVOT Diameter: 2.20 cm  AoV Area, Vmax: 2.47 cm?? AoV Area, VTI:  AoV Area, Vmn:  Tricuspid Valve and PA/RV Systolic Pressure: TR Max Velocity: 3.59 m/s RA   Pressure: 5 mmHg RVSP/PASP: 56.6 mmHg  Pulmonic Valve:  PV Max Velocity: 0.97 m/s PV Max PG: 3.8 mmHg PV Mean PG:    PHYSICIAN INTERPRETATION:  Left Ventricle: The left ventricular internal cavity size was normal.   Mild left ventricular hypertrophy. Global LV systolic function was     normal. Left ventricular ejection fraction, by visual estimation, is 50   to 55%.  Right Ventricle: Normal right ventricular size, wall  thickness, and   systolic function. The right ventricular size is normal. Global RV   systolic function is normal.  Left Atrium: The left atrium is mildly dilated.  Right Atrium: The right atrium is normal in size.  Pericardium: A small pericardial effusion is present.  Mitral Valve: The mitral valve is normal in structure. Moderate mitral   valve regurgitation is seen. MAC noted.  Tricuspid Valve: The tricuspid valve is normal. Mild tricuspid   regurgitation isvisualized. The tricuspid regurgitant velocity is 3.59   m/s, and with an assumed right atrial pressure of 5 mmHg, the estimated   right ventricular systolic pressure is moderately elevated at 56.6 mmHg.  Aortic Valve: The aortic valve is structurally normal, with no evidence     of sclerosis or stenosis. No evidence of aortic valve regurgitation is   seen.  Pulmonic Valve: The pulmonic valve is normal. Trace pulmonic valve   regurgitation.  Aorta: The aortic root and ascending aorta are structurally normal, with   no evidence of dilitation.    [id number] Ida Rogue MD  Electronically signed by [id number] Ida Rogue MD  Signature Date/Time: 06/15/2014/6:55:29 PM    *** Final ***    IMPRESSION: .    Verified By: Minna Merritts, M.D., MD    Penicillin: Rash  Iodine Strong: Rash  IVP Dye: Other  Valium: Other  Tramadol: N/V/Diarrhea  Vital Signs/Nurse's Notes: **Vital Signs.:   28-Apr-16 08:00  Temperature Temperature (F) 98.2  Celsius 36.7  Pulse Pulse 84  Respirations Respirations 20  Systolic BP Systolic BP 621  Diastolic BP (mmHg) Diastolic BP (mmHg) 76  Mean BP 102  Pulse Ox % Pulse Ox % 94  Oxygen Delivery 2L; Nasal Cannula    Impression 81 year old female with history of CAD s/p 3 vessel CABG in 1988 s/p multiple PCI since, PAF on Eliquis, chronic systolic CHF, PVD, COPD, HTN, HLD, and DM2 who was admitted to [facility] for outpatient abdominal aortogram w/run-off. Upon arriving back home she took a nap x 3 hours  and woke up with substernal chest pain.   1. NSTEMI/CAD s/p CABG s/p multiple PCI as above: -Scheduled for cardiac cath 4/29 (11:45 AM) with Dr. Fletcher Anon (needs day without lasix to see if renal function will improve, also needs eliquis wash out) -Continue  heparin gtt -Add nitro paste, post cath replace with her outpatient Imdur -Continue Coreg 3.125 mg bid -Gently hydrate this evening to protect kidneys -Eliquis has been held since arrival, continue to hold  2. Chronic systolic CHF: -Check pBNP -Echo has been ordered -Continue Coreg -Gently hydrate as above pre-cath  3. PAF: -Currently in NSR -Continue Coreg and amiodarone -Eliquis held for cath  4. Diverticulitis: -Cipro/Flagyl  5. HTN: -Continue current medications -Add prn hydralazine   Electronic Signatures: Idolina Primer, Ryan M (PA-C)  (Signed 28-Apr-16 11:44)  Authored: General Aspect/Present Illness, History and Physical Exam, Review of System, Family & Social History, Past Medical History, Home Medications, Labs, EKG , Radiology, Allergies, Vital Signs/Nurse's Notes, Impression/Plan Ida Rogue (MD)  (Signed 28-Apr-16 22:50)  Authored: General Aspect/Present Illness, History and Physical Exam, Review of System, Health Issues, EKG , Radiology, Impression/Plan  Co-Signer: General Aspect/Present Illness, Home Medications, Allergies   Last Updated: 28-Apr-16 22:50 by Ida Rogue (MD)

## 2014-06-18 NOTE — Op Note (Signed)
PATIENT NAME:  [name], [name] MR#:  [id number] DATE OF BIRTH:  1932/01/12  DATE OF PROCEDURE:  06/14/2014  PREOPERATIVE DIAGNOSES:  1. 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  2. Profound degenerative joint disease, requiring left total hip replacement.  3. Coronary artery disease.  4. Complication vascular device with occlusion of stents, bilateral lower extremities, placed remotely.   POSTOPERATIVE DIAGNOSES:   1. 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  2. Profound degenerative joint disease, requiring left total hip replacement.  3. Coronary artery disease.  4. Complication vascular device with occlusion of stents, bilateral lower extremities, placed remotely.   PROCEDURES PERFORMED: 1. Abdominal aortogram.  2. Bilateral lower extremity distal runoff third order catheter placement.   SURGEON: Katha Cabal, MD   SEDATION: Versed plus fentanyl.   CONTRAST USED: Isovue 60 mL.   FLUOROSCOPY TIME: Approximately 3 minutes.   ACCESS: Right common femoral artery 5 French sheath.  INDICATIONS: Ms. Fear is an 81 year old woman who presents to the office, referred by Dr. Marry Guan. She has ulceration of the left foot with known atherosclerotic occlusive disease. She has multiple failed stents, which were placed remotely. She is requiring a left total hip replacement, and there is concern over both infectious potential given an open wound as well as wound healing potential given her atherosclerotic changes. Risks and benefits for angiography with the hope of and/or possibility of intervention were reviewed. All questions answered. The patient agrees to proceed.   DESCRIPTION OF PROCEDURE: The patient is taken to the angio  suite, placed in the supine position. After adequate sedation is achieved, both groins are prepped and draped in a sterile fashion. Ultrasound is  placed in a sterile sleeve and the common femoral artery is identified. It is noted to have a large amount of calcific plaque present; however, toward its proximal aspect, there is an area that is echolucent and pulsatile, indicating patency. Image is recorded for the permanent record. Lidocaine 1% is infiltrated under direct visualization. Subsequently, access with a microneedle is obtained. Stiffened micropuncture kit is then required, as the standard would not track over the wire. Ultimately, an Amplatz wire is advanced into the abdominal aorta under fluoroscopic guidance, through the micro sheath and subsequently, a 4 [place] dilator, followed by a 5 [place] dilator, followed by a 5 [place] dilator with a 5 French sheath are inserted. A pigtail catheter is advanced over the wire. The pigtail catheter is positioned at the level of T12.   AP projection of the aorta is obtained. Pigtail catheter is repositioned and bilateral oblique views of the pelvis are obtained. The detector is then repositioned to AP and bilateral distal runoff is obtained. Image is particularly on the left, which is the leg of greater concern or inadequate, and a stiff angled Glidewire is used with a rim catheter to cross over. Subsequently, the rim catheter is exchanged for a pigtail catheter, which was positioned in the profunda and both magnified oblique views of the left groin are obtained, as well as distal runoff. After reviewing these images, the catheter is removed over a wire. Oblique view of the right groin is obtained through the sheath, and subsequently, the sheath is pulled and held, as she is not a candidate for any closure devices secondary to her extensive common femoral disease. There are no immediate complications.   INTERPRETATION: The abdominal aorta, in fact, all of her vessels are profoundly calcified and are  easily visualized with just plain fluoroscopy. There is extensive disease at the distal aorta with bilateral  greater than 80% ostial stenoses of the common iliacs. There is diffuse disease in both external iliacs. There is greater than 70% stenosis of both common femorals. There is occlusion of both SFAs throughout their entire course. Profunda femoris have ostial disease extending several centimeters, but otherwise appear to be rather large and well collateralized.   On the left, there is reconstitution of the at-knee popliteal and two-vessel runoff predominately through the peroneal, but also posterior tibial. On the right, there appears to be 2-vessel runoff, although it is somewhat difficult to ascertain. There is, again, at-knee popliteal reconstitution, occluded stents are noted in the SFAs bilaterally.   SUMMARY: The patient will require an aortobifemoral bypass. If not, then bilateral femoral endarterectomies with extensive stenting of the iliac arteries. Subsequently, a decision requiring distal bypass will be required. On the left, she would require a femoral to below-knee popliteal, which would then provide in-line flow all of the way to the ankle for wound healing. This will be discussed with her in the office.    ____________________________ Katha Cabal, MD ggs:mw D: 06/14/2014 11:00:15 ET T: 06/14/2014 14:40:03 ET JOB#: [id number]  cc: Katha Cabal, MD, <Dictator> Laurice Record. Holley Bouche., MD Mertie Clause. Fletcher Anon, MD Katha Cabal MD ELECTRONICALLY SIGNED 06/16/2014 13:06

## 2014-06-18 NOTE — Progress Notes (Signed)
Pt discharged home, w/ HH already in place, IV site DCd, bleeding controlled, tele monitor turned in, pt given her scrips, f/u appt instructions, and DC instructions for acute MI, understanding verbalized, patient left hospotal in car with her granddaughter.

## 2014-06-18 NOTE — H&P (Signed)
PATIENT NAME:  [name], [name] MR#:  [id number] DATE OF BIRTH:  04/21/1931  DATE OF ADMISSION:  06/14/2014  CHIEF COMPLAINT: Chest pain.   HISTORY OF PRESENT ILLNESS: This is an 81 year old female who was just recently here having a lower extremity angioplasty due to peripheral vascular disease. She got a CT scan of her abdomen at that time as well. When she got home from the procedure she began to develop some chest pain. The patient notably does have a history of CAD as well as a prior MI.  She came into the ED after the chest pain did not resolve with 3 nitroglycerins sublingual. She states that she woke up to this chest pain after she took a nap when she got home in the afternoon prior to admission, so the pain was not necessarily brought on by exertion. However, it is a substernal "hurt" per the patient associated with some shortness of breath. She denies any nausea, vomiting, abdominal pain, diaphoresis, blurred vision, or dizziness. She denies radiation of the pain.   In the ED, she was found to have some inferolateral EKG changes. Per chart review, she had a recent catheterization, which showed significant blockages and she was told that she would be medically managed. She is normally on Eliquis; however, she did not take it for the last 3 days. In the ED cardiology recommended starting a nitroglycerin drip as well as a heparin drip and admitting her, consulting them to see her in the morning.   PRIMARY CARDIOLOGIST:  Muhammad A. Kirke Corin, MD with [facility].   PRIMARY CARE PHYSICIAN: Janeann Forehand., MD.    PAST MEDICAL HISTORY: Atrial fibrillation, lymphoma, in remission for 6 years, diabetes mellitus, CAD, status post MI and prior CABG, hypertension, hypothyroidism, systolic congestive heart failure.   CURRENT MEDICATIONS: Tylenol p.r.n., pravastatin 40 mg daily, Protonix 40 mg b.i.d., Synthroid 100 mcg daily, Lantus 30 units at bedtime, Imdur extended release  60 mg 1-1/2 tablets daily, gabapentin 400 mg t.i.d., Eliquis 2.5 mg b.i.d., Coreg 3.125 mg b.i.d., benazepril 20 mg daily, amiodarone 200 mg daily, Norco 5/325 mg q. 6 hours p.r.n., Lasix 40 mg daily.   PAST SURGICAL HISTORY: Cholecystectomy, appendectomy, CABG, hysterectomy, right knee and right hip replacements.   ALLERGIES: TRAMADOL, PENICILLIN, IODINE, IVP DYE, VALIUM.   FAMILY HISTORY: Hypertension, CAD, diabetes mellitus, osteoarthritis.   SOCIAL HISTORY: Ex-smoker, quit 20 years ago. She denies alcohol or illicit drug use.   REVIEW OF SYSTEMS:   CONSTITUTIONAL: Denies fever, fatigue, weakness.  EYES: Denies blurred or double vision, pain or redness.  EAR, NOSE, AND THROAT: Denies ear pain, hearing loss, difficulty swallowing.  RESPIRATORY: Denies cough, dyspnea, or painful respiration.  CARDIOVASCULAR: Endorses chest pain. Denies edema or palpitations.  GASTROINTESTINAL: Denies nausea, vomiting, diarrhea. Denies abdominal pain. Denies constipation.  GENITOURINARY: Denies dysuria, hematuria, frequency.  ENDOCRINE: Denies nocturia,  heat or cold intolerance. Endorses chronic stable thyroid problems.  HEMATOLOGIC AND LYMPHATIC: Denies easy bruising, bleeding, swollen glands.  INTEGUMENTARY: Denies acne, rash, or lesion.  MUSCULOSKELETAL: Denies arthritis, joint swelling, or gout.  NEUROLOGICAL: Denies numbness, weakness, or headache.  PSYCHIATRIC: Denies anxiety, insomnia, depression.   PHYSICAL EXAMINATION:  CONSTITUTIONAL, VITAL SIGNS: Blood pressure 161/67, pulse 82, temperature 98.2, respirations 20 with 99% O2 saturations on room air.  GENERAL: This is an elderly lady sitting up in bed who appears to be in mild discomfort.  HEENT: Pupils equal, round, and react to light and accommodation. Extraocular movements intact. No scleral  icterus. Moist mucosal membranes.  NECK: Thyroid is not enlarged. Neck is supple. No masses, nontender. No cervical adenopathy. No JVD.  RESPIRATORY:  Clear to auscultation bilaterally with no rales, rhonchi, or wheezing. No respiratory distress.  CARDIOVASCULAR: Regular rate and rhythm with no murmurs, rubs, or gallops on exam. Good pedal pulses with no lower extremity edema. She does endorse some central substernal chest pain, which is not particularly reproducible on exam.  ABDOMEN: Soft, nontender, nondistended with good bowel sounds.  MUSCULOSKELETAL: Muscular strength 5/5 in all 4 extremities. Full spontaneous range of motion throughout. No cyanosis or clubbing.  SKIN: No rash. She does have a left lower extremity chronic lesion. Otherwise, skin is warm, dry, and intact.  LYMPHATIC: No adenopathy.  NEUROLOGICAL: Cranial nerves intact. Sensation intact throughout. No dysarthria or aphasia.  PSYCHIATRIC: Alert and oriented x 3, cooperative, not confused or agitated.   LABORATORY DATA: White count 5.6, hemoglobin 10.7, hematocrit 32.8, platelets 278,000. Sodium 137, potassium 4.5, chloride 99, bicarbonate 27, BUN 37, creatinine 1.57, glucose 405, calcium 9, total protein 7.3, albumin 3.8, t-bilirubin 0.3, alkaline phosphatase 62, AST 28, ALT 13, troponin 0.11, INR 1. UA negative.   RADIOLOGY: Chest x-ray: Radiographic findings consistent with developing mild to moderate CHF. CT abdomen and pelvis, which was performed earlier today: Findings consistent with focal sigmoid diverticulitis, no abscess is noted.   ASSESSMENT AND PLAN:  1.  Non-ST elevation myocardial infarction. We will place the patient on a nitroglycerin drip and a heparin drip. Admit her to CCU.  Consult  [facility] to see her in the morning. She was put on a nitroglycerin drip because the nitroglycerin paste did not alleviate her chest pain.  2.  Systolic congestive heart failure. We will get another echocardiogram to evaluate this in the setting of NSTEMI. We will also get a cardiology consult for further management of this.  3.  Diverticulitis. In  conjunction with her pain, we will treat this, she was given p.o. Cipro and Flagyl in the ED, we will continue those medications for the full course inpatient to treat the diverticulitis seen on CT imaging.  4.  Diabetes mellitus. We will place the patient on sliding scale insulin while here in the hospital.  5.  Hypertension. We will continue the patient's home medications to control her blood pressure.  6.  Hypothyroidism. We will continue the patient's home dose of Synthroid.  7.  Deep vein thrombosis prophylaxis. The patient is on a heparin drip for NSTEMI, see above.   CODE STATUS: This patient is full code.   TIME SPENT ON THIS ADMISSION: 50 minutes.   ____________________________ Candace Cruiseavid F. Anne HahnWillis, MD dfw:AT D: 06/15/2014 02:56:55 ET T: 06/15/2014 03:36:34 ET JOB#: [id number]  cc: Candace Cruiseavid F. Anne HahnWillis, MD, <Dictator> Soham Hollett Scotty CourtF Dorethy Tomey MD ELECTRONICALLY SIGNED 06/15/2014 4:35

## 2014-06-18 NOTE — Progress Notes (Signed)
[facility].  [id number]

## 2014-06-18 NOTE — Progress Notes (Signed)
Alert and oriented.  Sinus rhythm on monitor.  Requested pain medication for chronic back and leg pain once during shift for which PRN pain medication was administered to good effect.  No shortness of breath noted.  In no apparent distress at this time.

## 2014-06-19 ENCOUNTER — Other Ambulatory Visit: Payer: Medicare Other

## 2014-06-21 ENCOUNTER — Encounter: Admission: RE | Payer: Self-pay | Source: Ambulatory Visit

## 2014-06-21 ENCOUNTER — Inpatient Hospital Stay: Admission: RE | Admit: 2014-06-21 | Payer: Medicare Other | Source: Ambulatory Visit | Admitting: Vascular Surgery

## 2014-06-21 SURGERY — ENDARTERECTOMY, FEMORAL
Anesthesia: Choice

## 2014-07-04 ENCOUNTER — Ambulatory Visit: Payer: Medicare Other | Admitting: Cardiovascular Disease

## 2014-07-10 ENCOUNTER — Ambulatory Visit: Payer: Medicare Other | Admitting: Cardiovascular Disease

## 2014-07-10 ENCOUNTER — Encounter: Payer: Self-pay | Admitting: Cardiovascular Disease

## 2014-07-10 ENCOUNTER — Ambulatory Visit (INDEPENDENT_AMBULATORY_CARE_PROVIDER_SITE_OTHER): Payer: Medicare Other | Admitting: Cardiovascular Disease

## 2014-07-10 VITALS — BP 116/56 | HR 71 | Ht 61.5 in | Wt 140.0 lb

## 2014-07-10 DIAGNOSIS — Z01810 Encounter for preprocedural cardiovascular examination: Secondary | ICD-10-CM

## 2014-07-10 DIAGNOSIS — I480 Paroxysmal atrial fibrillation: Secondary | ICD-10-CM

## 2014-07-10 DIAGNOSIS — I5022 Chronic systolic (congestive) heart failure: Secondary | ICD-10-CM

## 2014-07-10 DIAGNOSIS — I25708 Atherosclerosis of coronary artery bypass graft(s), unspecified, with other forms of angina pectoris: Secondary | ICD-10-CM

## 2014-07-10 NOTE — Assessment & Plan Note (Signed)
She has mild left leg edema but overall seems to be euvolemic. We have been somewhat limited due to chronic kidney disease. Continue current dose of 40 mg once daily. Check labs today.

## 2014-07-10 NOTE — Assessment & Plan Note (Signed)
Recent non-ST elevation myocardial infarction. However, cardiac catheterization showed no culprit. She denies anginal symptoms. Continue medical therapy.

## 2014-07-10 NOTE — Assessment & Plan Note (Signed)
She is maintaining in sinus rhythm. Continue long-term anticoagulation with small dose Eliquis.

## 2014-07-10 NOTE — Patient Instructions (Addendum)
Medication Instructions:  Your physician recommends that you continue on your current medications as directed. Please refer to the Current Medication list given to you today.   Labwork: Your physician recommends that you have labs today: BMET CBC   Testing/Procedures: None  Follow-Up: Your physician recommends that you schedule a follow-up appointment in: ONE MONTH with Dr. Kirke CorinArida.  Your physician recommends that you schedule a followup appointment with [facility], Dr. Lorretta HarpSchneir [phone]   Any Other Special Instructions Will Be Listed Below (If Applicable).

## 2014-07-10 NOTE — Progress Notes (Signed)
Primary care physician: Dr. Juanetta GoslingHawkins  HPI  This is a 81 year old female who is here today for a followup visit regarding coronary artery disease, paroxysmal atrial fibrillation and chronic systolic heart failure.   She has extensive cardiac history which include coronary artery disease status post CABG in 1988. She had multiple PCI done on the SVGs. Most recent cardiac catheterization in 05/2014 showed occluded native vessels with patent LIMA to LAD and SVG to OM.  The SVG to RCA is chronically occluded and fills via left-to-right collaterals. There was diffuse disease in the distal LAD beyond the anastomosis. Ejection fraction was 50 % by echo. There was no significant change from previous cardiac catheterization in 2012.  She continues to have significant left hip pain and left hip surgery was planned which was canceled due to decompensated heart failure. She then developed left foot nonhealing ulcer and was evaluated by Dr. Gilda CreaseSchnier with an angiogram. She will require a hybrid endovascular surgical revascularization which was planned recently. However, she presented late last month with chest pain with mildly elevated troponin. She underwent cardiac catheterization which showed no significant change from her most recent cardiac catheterization in November. She has been doing reasonably well. She denies recurrent chest discomfort. He continues to have nonhealing ulcer on the medial aspect of the left foot which might be slightly worse. Her mobility continues to be limited.   Allergies  Allergen Reactions  . Contrast Media [Iodinated Diagnostic Agents] Shortness Of Breath  . Tramadol     Nausea   . Valium [Diazepam] Other (See Comments)    Elevated heart rate  . Iodine Strong [Iodine] Rash  . Penicillins Rash     Current Outpatient Prescriptions on File Prior to Visit  Medication Sig Dispense Refill  . amiodarone (PACERONE) 200 MG tablet Take 1 tablet (200 mg total) by mouth daily. 90 tablet  3  . apixaban (ELIQUIS) 2.5 MG TABS tablet Take 1 tablet (2.5 mg total) by mouth 2 (two) times daily. 60 tablet 6  . benazepril (LOTENSIN) 20 MG tablet TAKE 1 TABLET (20 MG TOTAL) BY MOUTH DAILY. 30 tablet 5  . carvedilol (COREG) 3.125 MG tablet Take 1 tablet (3.125 mg total) by mouth 2 (two) times daily. 180 tablet 3  . furosemide (LASIX) 40 MG tablet Take 1 tablet (40 mg total) by mouth daily. 60 tablet 6  . gabapentin (NEURONTIN) 400 MG capsule Take 400 mg by mouth 3 (three) times daily.    [name]. HYDROcodone-acetaminophen (NORCO/VICODIN) 5-325 MG per tablet Take 2 tablets by mouth every 4 (four) hours as needed for moderate pain.    [name]. insulin glargine (LANTUS) 100 UNIT/ML injection Inject 0.1 mLs (10 Units total) into the skin at bedtime. 10 mL 11  . insulin lispro (HUMALOG) 100 UNIT/ML injection Inject 0.04 mLs (4 Units total) into the skin 3 (three) times daily before meals. 10 mL 11  . isosorbide mononitrate (IMDUR) 60 MG 24 hr tablet Take 1.5 mg by mouth daily.    [name]. levothyroxine (SYNTHROID, LEVOTHROID) 100 MCG tablet Take 100 mcg by mouth daily before breakfast.    . nitroGLYCERIN (NITROSTAT) 0.4 MG SL tablet Place 1 tablet (0.4 mg total) under the tongue every 5 (five) minutes as needed for chest pain. 30 tablet 0  . potassium chloride (K-DUR) 10 MEQ tablet Take 10 mEq by mouth daily.     . pravastatin (PRAVACHOL) 40 MG tablet Take 40 mg by mouth daily.    . traMADol (ULTRAM) 50 MG tablet Take  by mouth as needed.     No current facility-administered medications on file prior to visit.     Past Medical History  Diagnosis Date  . DM2 (diabetes mellitus, type 2)     onset age 70 insulin dependent  . Hyperlipidemia   . COPD (chronic obstructive pulmonary disease)   . Peripheral vascular disease   . CAD (coronary artery disease)     a. MI 1988 s/p CABG in 1988, multiple PCI on SVGs; b. cath 2012: occluded native coronary arteries, patent LIMA to LAD (distal LAD dz), patent SVG-OM &  occluded SVG-RCA which filled via left to right collats; c. cath 12/2013 patent LIMA-LAD & SVG-O. known occluded VG-RCA w/ L-R collats, no change since cath 2012  . Chronic systolic CHF (congestive heart failure)     a. 03/2012: EF 40-45%, mild lateral wall HK, mod inf HK, mod post wall HK, mild LVH, mild MR/TR   . Paroxysmal atrial fibrillation     a. CHADSVASc 7: yearly risk of CVA 9.6%;. b. on Eliquis 2.5 mg bid (age, SCr, borderline wt 62.9 Kg)   . HTN (hypertension)   . Yeast infection   . UTI (urinary tract infection)      Past Surgical History  Procedure Laterality Date  . Abdominal hysterectomy    . Coronary artery bypass graft    . Appendectomy    . Cholecystectomy    . Ovary surgery    . Angioplasty / stenting femoral      Bilateral SFA stenting  . Femur surgery    . Cardiac catheterization      [facility]  . Cardiac catheterization  12/2013     Family History  Problem Relation Age of Onset  . Diabetes Mother   . Heart disease Mother   . Hypertension Mother   . Heart disease Father   . Heart disease Brother      History   Social History  . Marital Status: Married    Spouse Name: N/A  . Number of Children: N/A  . Years of Education: N/A   Occupational History  . Not on file.   Social History Main Topics  . Smoking status: Former Smoker -- 15 years    Types: Cigarettes    Quit date: 02/18/1988  . Smokeless tobacco: Never Used  . Alcohol Use: No  . Drug Use: No  . Sexual Activity: Not on file   Other Topics Concern  . Not on file   Social History Narrative     PHYSICAL EXAM   BP 116/56 mmHg  Pulse 71  Ht 5' 1.5" (1.562 m)  Wt 140 lb (63.504 kg)  BMI 26.03 kg/m2 Constitutional: She is oriented to person, place, and time. She appears well-developed and well-nourished. No distress.  HENT: No nasal discharge.  Head: Normocephalic and atraumatic.  Eyes: Pupils are equal and round. Right eye exhibits no discharge. Left eye exhibits no discharge.    Neck: Normal range of motion. Neck supple. Mild JVD present. No thyromegaly present.  Cardiovascular: Normal rate, regular rhythm, normal heart sounds. Exam reveals no gallop and no friction rub. There is a 1/6 systolic ejection murmur at the aortic area Pulmonary/Chest: Effort normal and breath sounds normal. No stridor. No respiratory distress. She has no wheezes. Few bibasilar crackles. She exhibits no tenderness.  Abdominal: Soft. Bowel sounds are normal. She exhibits no distension. There is no tenderness. There is no rebound and no guarding.  Musculoskeletal: Normal range of motion. She exhibits +1 edema  and no tenderness.  Neurological: She is alert and oriented to person, place, and time. Coordination normal.  Skin: Skin is warm and dry. No rash noted. She is not diaphoretic. No erythema. No pallor.  Psychiatric: She has a normal mood and affect. Her behavior is normal. Judgment and thought content normal.     [id number]  Rhythm  -Right bundle branch block and right axis -possible right ventricular hypertrophy  -consider pulmonary disease.   ABNORMAL      ASSESSMENT AND PLAN

## 2014-07-10 NOTE — Assessment & Plan Note (Signed)
The patient needs to have vascular surgery for critical limb ischemia. Unfortunately, she has extensive medical problems which puts her at high risk for cardiovascular complications. At the same time, she is at risk for limb loss and this has to be taken into account. I recommend waiting one month from the time of most recent myocardial infarction. The patient can proceed with surgery in June at an overall high risk. The cardiac catheterization from last month is reassuring. Eliquis can be held 2 days before surgery and resumed after if no bleeding complications.

## 2014-07-11 LAB — BASIC METABOLIC PANEL
BUN / CREAT RATIO: 19 (ref 11–26)
BUN: 35 mg/dL — ABNORMAL HIGH (ref 8–27)
CO2: 20 mmol/L (ref 18–29)
CREATININE: 1.83 mg/dL — AB (ref 0.57–1.00)
Calcium: 8.4 mg/dL — ABNORMAL LOW (ref 8.7–10.3)
Chloride: 101 mmol/L (ref 97–108)
GFR calc Af Amer: 30 mL/min/{1.73_m2} — ABNORMAL LOW (ref >59)
GFR calc non Af Amer: 26 mL/min/{1.73_m2} — ABNORMAL LOW (ref >59)
GLUCOSE: 205 mg/dL — AB (ref 65–99)
Potassium: 5 mmol/L (ref 3.5–5.2)
Sodium: 139 mmol/L (ref 134–144)

## 2014-07-11 LAB — CBC
HEMOGLOBIN: 8.5 g/dL — AB (ref 11.1–15.9)
Hematocrit: 27.6 % — ABNORMAL LOW (ref 34.0–46.6)
MCH: 28.7 pg (ref 26.6–33.0)
MCHC: 30.8 g/dL — AB (ref 31.5–35.7)
MCV: 93 fL (ref 79–97)
Platelets: 279 10*3/uL (ref 150–379)
RBC: 2.96 x10E6/uL — AB (ref 3.77–5.28)
RDW: 14.8 % (ref 12.3–15.4)
WBC: 4.4 10*3/uL (ref 3.4–10.8)

## 2014-07-24 ENCOUNTER — Telehealth: Payer: Self-pay

## 2014-07-24 NOTE — Telephone Encounter (Signed)
Pt c/o Shortness Of Breath: STAT if SOB developed within the last 24 hours or pt is noticeably SOB on the phone  1. Are you currently SOB (can you hear that pt is SOB on the phone)? yes  2. How long have you been experiencing SOB? Since 4:30 am today  3. Are you SOB when sitting or when up moving around? Just when laying flat  4. Are you currently experiencing any other symptoms? no  States her "health nurse is coming at 12-12:30  Pt has an appt tomorrow at 3:45. Pt also states if she gets worse, she will go to the ER

## 2014-07-24 NOTE — Telephone Encounter (Signed)
S/w pt who indicates for the past 2-3 weeks, she wakes up at 3am and is short of breath. Symptoms relieved when sitting upright.  Denies being short of breath at this time. States compliant with medications.  No changes in diet. Denies lower extremity swelling.  States sore on ankle which is not healing. Has appt with Dr. Kirke CorinArida tomorrow  Stated if she reached a point where she could not breathe, she would call 911.

## 2014-07-25 ENCOUNTER — Ambulatory Visit (INDEPENDENT_AMBULATORY_CARE_PROVIDER_SITE_OTHER): Payer: Medicare Other | Admitting: Cardiovascular Disease

## 2014-07-25 ENCOUNTER — Encounter: Payer: Self-pay | Admitting: Cardiovascular Disease

## 2014-07-25 VITALS — BP 136/72 | HR 69 | Ht 61.5 in | Wt 140.0 lb

## 2014-07-25 DIAGNOSIS — I480 Paroxysmal atrial fibrillation: Secondary | ICD-10-CM | POA: Diagnosis not present

## 2014-07-25 DIAGNOSIS — I2510 Atherosclerotic heart disease of native coronary artery without angina pectoris: Secondary | ICD-10-CM

## 2014-07-25 DIAGNOSIS — I5022 Chronic systolic (congestive) heart failure: Secondary | ICD-10-CM

## 2014-07-25 DIAGNOSIS — D649 Anemia, unspecified: Secondary | ICD-10-CM | POA: Insufficient documentation

## 2014-07-25 DIAGNOSIS — I4891 Unspecified atrial fibrillation: Secondary | ICD-10-CM | POA: Diagnosis not present

## 2014-07-25 MED ORDER — TORSEMIDE 20 MG PO TABS: 20.0000 mg | ORAL_TABLET | Freq: Two times a day (BID) | ORAL | Status: DC

## 2014-07-25 NOTE — Patient Instructions (Signed)
Medication Instructions:  Your physician has recommended you make the following change in your medication:  1) STOP taking lasix 2) START taking torsemide 20mg  twice a day   Labwork: Your physician recommends that you return for lab work in one week: BMET   Testing/Procedures: none  Follow-Up: Your physician recommends that you schedule a follow-up appointment in: one month with Dr. Kirke CorinArida.    Any Other Special Instructions Will Be Listed Below (If Applicable).

## 2014-07-25 NOTE — Assessment & Plan Note (Signed)
She has mostly heart failure symptoms and no anginal symptoms. Continue medical therapy.

## 2014-07-25 NOTE — Assessment & Plan Note (Signed)
Most recent hemoglobin was 8.5. This is likely related to chronic kidney disease. I asked her to follow-up with Dr. Juanetta GoslingHawkins for further management and evaluation.

## 2014-07-25 NOTE — Assessment & Plan Note (Signed)
The patient appears to be mildly fluid overloaded. She does not seem to be responding well to Lasix. Thus, I switched her to torsemide 20 mg twice daily. Check basic metabolic profile in one week. She does have underlying chronic kidney disease with a most recent creatinine of 1.8. The dose can be adjusted based on the response.

## 2014-07-25 NOTE — Progress Notes (Signed)
Primary care physician: Dr. Juanetta GoslingHawkins  HPI  This is a 81 year old female who is here today for a followup visit regarding coronary artery disease, paroxysmal atrial fibrillation and chronic systolic heart failure.   She has extensive cardiac history which include coronary artery disease status post CABG in 1988. She had multiple PCI done on the SVGs. Most recent cardiac catheterization in 05/2014 showed occluded native vessels with patent LIMA to LAD and SVG to OM.  The SVG to RCA is chronically occluded and fills via left-to-right collaterals. There was diffuse disease in the distal LAD beyond the anastomosis. Ejection fraction was 50 % by echo. There was no significant change from previous cardiac catheterization in 2012.  She continues to have significant left hip pain and left hip surgery was planned which was canceled due to decompensated heart failure. She then developed left foot nonhealing ulcer and was evaluated by Dr. Gilda CreaseSchnier with an angiogram. She will require a hybrid endovascular surgical revascularization which was planned recently. However, she presented in April with chest pain with mildly elevated troponin. She underwent cardiac catheterization which showed no significant change from her most recent cardiac catheterization in November.  She reports worsening dyspnea over the last few days especially at night. She wakes up a few times at night gasping for air. She noticed abdominal swelling. She has been taking Lasix 40 mg once daily with no good urine output according the patient. She continues to be physically very limited capacity is wheelchair bound.   Allergies  Allergen Reactions  . Contrast Media [Iodinated Diagnostic Agents] Shortness Of Breath  . Tramadol     Nausea   . Valium [Diazepam] Other (See Comments)    Elevated heart rate  . Iodine Strong [Iodine] Rash  . Penicillins Rash     Current Outpatient Prescriptions on File Prior to Visit  Medication Sig Dispense  Refill  . amiodarone (PACERONE) 200 MG tablet Take 1 tablet (200 mg total) by mouth daily. 90 tablet 3  . apixaban (ELIQUIS) 2.5 MG TABS tablet Take 1 tablet (2.5 mg total) by mouth 2 (two) times daily. 60 tablet 6  . benazepril (LOTENSIN) 20 MG tablet TAKE 1 TABLET (20 MG TOTAL) BY MOUTH DAILY. 30 tablet 5  . carvedilol (COREG) 3.125 MG tablet Take 1 tablet (3.125 mg total) by mouth 2 (two) times daily. 180 tablet 3  . furosemide (LASIX) 40 MG tablet Take 1 tablet (40 mg total) by mouth daily. 60 tablet 6  . gabapentin (NEURONTIN) 400 MG capsule Take 400 mg by mouth 3 (three) times daily.    [name]. HYDROcodone-acetaminophen (NORCO/VICODIN) 5-325 MG per tablet Take 2 tablets by mouth every 4 (four) hours as needed for moderate pain.    [name]. insulin glargine (LANTUS) 100 UNIT/ML injection Inject 0.1 mLs (10 Units total) into the skin at bedtime. 10 mL 11  . insulin lispro (HUMALOG) 100 UNIT/ML injection Inject 0.04 mLs (4 Units total) into the skin 3 (three) times daily before meals. 10 mL 11  . isosorbide mononitrate (IMDUR) 60 MG 24 hr tablet Take 1.5 mg by mouth daily.    [name]. levothyroxine (SYNTHROID, LEVOTHROID) 100 MCG tablet Take 100 mcg by mouth daily before breakfast.    . nitroGLYCERIN (NITROSTAT) 0.4 MG SL tablet Place 1 tablet (0.4 mg total) under the tongue every 5 (five) minutes as needed for chest pain. 30 tablet 0  . potassium chloride (K-DUR) 10 MEQ tablet Take 10 mEq by mouth daily.     . pravastatin (PRAVACHOL)  40 MG tablet Take 40 mg by mouth daily.     No current facility-administered medications on file prior to visit.     Past Medical History  Diagnosis Date  . DM2 (diabetes mellitus, type 2)     onset age 41 insulin dependent  . Hyperlipidemia   . COPD (chronic obstructive pulmonary disease)   . Peripheral vascular disease   . CAD (coronary artery disease)     a. MI 1988 s/p CABG in 1988, multiple PCI on SVGs; b. cath 2012: occluded native coronary arteries, patent LIMA to LAD  (distal LAD dz), patent SVG-OM & occluded SVG-RCA which filled via left to right collats; c. cath 12/2013 patent LIMA-LAD & SVG-O. known occluded VG-RCA w/ L-R collats, no change since cath 2012  . Chronic systolic CHF (congestive heart failure)     a. 03/2012: EF 40-45%, mild lateral wall HK, mod inf HK, mod post wall HK, mild LVH, mild MR/TR   . Paroxysmal atrial fibrillation     a. CHADSVASc 7: yearly risk of CVA 9.6%;. b. on Eliquis 2.5 mg bid (age, SCr, borderline wt 62.9 Kg)   . HTN (hypertension)   . Yeast infection   . UTI (urinary tract infection)      Past Surgical History  Procedure Laterality Date  . Abdominal hysterectomy    . Coronary artery bypass graft    . Appendectomy    . Cholecystectomy    . Ovary surgery    . Angioplasty / stenting femoral      Bilateral SFA stenting  . Femur surgery    . Cardiac catheterization      mc  . Cardiac catheterization  12/2013     Family History  Problem Relation Age of Onset  . Diabetes Mother   . Heart disease Mother   . Hypertension Mother   . Heart disease Father   . Heart disease Brother      History   Social History  . Marital Status: Married    Spouse Name: N/A  . Number of Children: N/A  . Years of Education: N/A   Occupational History  . Not on file.   Social History Main Topics  . Smoking status: Former Smoker -- 15 years    Types: Cigarettes    Quit date: 02/18/1988  . Smokeless tobacco: Never Used  . Alcohol Use: No  . Drug Use: No  . Sexual Activity: Not on file   Other Topics Concern  . Not on file   Social History Narrative     PHYSICAL EXAM   BP 136/72 mmHg  Pulse 69  Ht 5' 1.5" (1.562 m)  Wt 140 lb (63.504 kg)  BMI 26.03 kg/m2 Constitutional: She is oriented to person, place, and time. She appears well-developed and well-nourished. No distress.  HENT: No nasal discharge.  Head: Normocephalic and atraumatic.  Eyes: Pupils are equal and round. Right eye exhibits no discharge.  Left eye exhibits no discharge.  Neck: Normal range of motion. Neck supple. Mild JVD present. No thyromegaly present.  Cardiovascular: Normal rate, regular rhythm, normal heart sounds. Exam reveals no gallop and no friction rub. There is a 1/6 systolic ejection murmur at the aortic area Pulmonary/Chest: Effort normal . No stridor. No respiratory distress. She has no wheezes. Bibasilar crackles noted worse on the left base. She exhibits no tenderness.  Abdominal: Soft. Bowel sounds are normal. She exhibits no distension. There is no tenderness. There is no rebound and no guarding.  Musculoskeletal: Normal range  of motion. She exhibits +1 edema and no tenderness.  Neurological: She is alert and oriented to person, place, and time. Coordination normal.  Skin: Skin is warm and dry. No rash noted. She is not diaphoretic. No erythema. No pallor.  Psychiatric: She has a normal mood and affect. Her behavior is normal. Judgment and thought content normal.     EKG: Sinus  Rhythm  -Right bundle branch block and right axis -possible right ventricular hypertrophy  -consider pulmonary disease or posterior fasicular block.   -Anterior infarct -age undetermined.   ABNORMAL      ASSESSMENT AND PLAN

## 2014-07-25 NOTE — Assessment & Plan Note (Signed)
She is maintaining in sinus rhythm on amiodarone. Continue low-dose Eliquis.

## 2014-07-28 ENCOUNTER — Ambulatory Visit: Payer: Self-pay | Admitting: Urology

## 2014-08-03 ENCOUNTER — Other Ambulatory Visit (INDEPENDENT_AMBULATORY_CARE_PROVIDER_SITE_OTHER): Payer: Medicare Other

## 2014-08-03 DIAGNOSIS — I4891 Unspecified atrial fibrillation: Secondary | ICD-10-CM

## 2014-08-03 DIAGNOSIS — I480 Paroxysmal atrial fibrillation: Secondary | ICD-10-CM

## 2014-08-04 LAB — BASIC METABOLIC PANEL
BUN / CREAT RATIO: 19 (ref 11–26)
BUN: 36 mg/dL — ABNORMAL HIGH (ref 8–27)
CO2: 25 mmol/L (ref 18–29)
Calcium: 8.9 mg/dL (ref 8.7–10.3)
Chloride: 99 mmol/L (ref 97–108)
Creatinine, Ser: 1.87 mg/dL — ABNORMAL HIGH (ref 0.57–1.00)
GFR, EST AFRICAN AMERICAN: 29 mL/min/{1.73_m2} — AB (ref >59)
GFR, EST NON AFRICAN AMERICAN: 25 mL/min/{1.73_m2} — AB (ref >59)
Glucose: 155 mg/dL — ABNORMAL HIGH (ref 65–99)
POTASSIUM: 5.1 mmol/L (ref 3.5–5.2)
SODIUM: 141 mmol/L (ref 134–144)

## 2014-08-07 ENCOUNTER — Telehealth: Payer: Self-pay | Admitting: Family Medicine

## 2014-08-07 NOTE — Telephone Encounter (Signed)
Spoke to [place] (home health rep) who faxed the urine results for the pt. Pt is positive for UTI and E. Coli she states its not sensitive to anything so she doesn't know if oral will work.   Pharmacy [facility] on [address]

## 2014-08-08 ENCOUNTER — Encounter: Payer: Self-pay | Admitting: Medical Oncology

## 2014-08-08 ENCOUNTER — Ambulatory Visit: Payer: Medicare Other | Admitting: Cardiovascular Disease

## 2014-08-08 ENCOUNTER — Emergency Department
Admission: EM | Admit: 2014-08-08 | Discharge: 2014-08-08 | Disposition: A | Discharge status: Home / Self Care | Payer: Medicare Other | Source: Home / Self Care | Attending: Emergency Medicine | Admitting: Emergency Medicine

## 2014-08-08 DIAGNOSIS — N390 Urinary tract infection, site not specified: Secondary | ICD-10-CM

## 2014-08-08 DIAGNOSIS — Z9049 Acquired absence of other specified parts of digestive tract: Secondary | ICD-10-CM | POA: Diagnosis present

## 2014-08-08 DIAGNOSIS — Z794 Long term (current) use of insulin: Secondary | ICD-10-CM | POA: Insufficient documentation

## 2014-08-08 DIAGNOSIS — I739 Peripheral vascular disease, unspecified: Secondary | ICD-10-CM | POA: Diagnosis present

## 2014-08-08 DIAGNOSIS — Z951 Presence of aortocoronary bypass graft: Secondary | ICD-10-CM

## 2014-08-08 DIAGNOSIS — Z87891 Personal history of nicotine dependence: Secondary | ICD-10-CM | POA: Insufficient documentation

## 2014-08-08 DIAGNOSIS — Z833 Family history of diabetes mellitus: Secondary | ICD-10-CM

## 2014-08-08 DIAGNOSIS — Z880 Allergy status to penicillin: Secondary | ICD-10-CM | POA: Insufficient documentation

## 2014-08-08 DIAGNOSIS — E119 Type 2 diabetes mellitus without complications: Secondary | ICD-10-CM

## 2014-08-08 DIAGNOSIS — A499 Bacterial infection, unspecified: Secondary | ICD-10-CM | POA: Diagnosis not present

## 2014-08-08 DIAGNOSIS — E785 Hyperlipidemia, unspecified: Secondary | ICD-10-CM | POA: Diagnosis present

## 2014-08-08 DIAGNOSIS — I10 Essential (primary) hypertension: Secondary | ICD-10-CM

## 2014-08-08 DIAGNOSIS — B9620 Unspecified Escherichia coli [E. coli] as the cause of diseases classified elsewhere: Secondary | ICD-10-CM | POA: Diagnosis present

## 2014-08-08 DIAGNOSIS — I480 Paroxysmal atrial fibrillation: Secondary | ICD-10-CM | POA: Diagnosis present

## 2014-08-08 DIAGNOSIS — Z79899 Other long term (current) drug therapy: Secondary | ICD-10-CM

## 2014-08-08 DIAGNOSIS — Z7401 Bed confinement status: Secondary | ICD-10-CM

## 2014-08-08 DIAGNOSIS — Z888 Allergy status to other drugs, medicaments and biological substances status: Secondary | ICD-10-CM

## 2014-08-08 DIAGNOSIS — Z91041 Radiographic dye allergy status: Secondary | ICD-10-CM

## 2014-08-08 DIAGNOSIS — Z8249 Family history of ischemic heart disease and other diseases of the circulatory system: Secondary | ICD-10-CM

## 2014-08-08 DIAGNOSIS — J449 Chronic obstructive pulmonary disease, unspecified: Secondary | ICD-10-CM | POA: Diagnosis present

## 2014-08-08 DIAGNOSIS — I252 Old myocardial infarction: Secondary | ICD-10-CM

## 2014-08-08 DIAGNOSIS — M1610 Unilateral primary osteoarthritis, unspecified hip: Secondary | ICD-10-CM | POA: Diagnosis present

## 2014-08-08 DIAGNOSIS — I5022 Chronic systolic (congestive) heart failure: Secondary | ICD-10-CM | POA: Diagnosis present

## 2014-08-08 DIAGNOSIS — I2510 Atherosclerotic heart disease of native coronary artery without angina pectoris: Secondary | ICD-10-CM | POA: Diagnosis present

## 2014-08-08 DIAGNOSIS — Z9889 Other specified postprocedural states: Secondary | ICD-10-CM

## 2014-08-08 DIAGNOSIS — Z90710 Acquired absence of both cervix and uterus: Secondary | ICD-10-CM

## 2014-08-08 DIAGNOSIS — Z95818 Presence of other cardiac implants and grafts: Secondary | ICD-10-CM

## 2014-08-08 LAB — COMPREHENSIVE METABOLIC PANEL
ALK PHOS: 71 U/L (ref 38–126)
ALT: 7 U/L — AB (ref 14–54)
ANION GAP: 4 — AB (ref 5–15)
AST: 16 U/L (ref 15–41)
Albumin: 3.8 g/dL (ref 3.5–5.0)
BUN: 41 mg/dL — ABNORMAL HIGH (ref 6–20)
CO2: 28 mmol/L (ref 22–32)
Calcium: 8.6 mg/dL — ABNORMAL LOW (ref 8.9–10.3)
Chloride: 105 mmol/L (ref 101–111)
Creatinine, Ser: 2.08 mg/dL — ABNORMAL HIGH (ref 0.44–1.00)
GFR calc non Af Amer: 21 mL/min — ABNORMAL LOW (ref >60)
GFR, EST AFRICAN AMERICAN: 25 mL/min — AB (ref >60)
GLUCOSE: 209 mg/dL — AB (ref 65–99)
POTASSIUM: 4.7 mmol/L (ref 3.5–5.1)
SODIUM: 137 mmol/L (ref 135–145)
TOTAL PROTEIN: 6.8 g/dL (ref 6.5–8.1)
Total Bilirubin: 0.4 mg/dL (ref 0.3–1.2)

## 2014-08-08 LAB — URINALYSIS COMPLETE WITH MICROSCOPIC (ARMC ONLY)
Bilirubin Urine: NEGATIVE
Glucose, UA: NEGATIVE mg/dL
KETONES UR: NEGATIVE mg/dL
Nitrite: NEGATIVE
PH: 5 (ref 5.0–8.0)
Protein, ur: 30 mg/dL — AB
Specific Gravity, Urine: 1.01 (ref 1.005–1.030)

## 2014-08-08 LAB — CBC
HEMATOCRIT: 29.1 % — AB (ref 35.0–47.0)
Hemoglobin: 8.9 g/dL — ABNORMAL LOW (ref 12.0–16.0)
MCH: 26.6 pg (ref 26.0–34.0)
MCHC: 30.6 g/dL — ABNORMAL LOW (ref 32.0–36.0)
MCV: 86.9 fL (ref 80.0–100.0)
PLATELETS: 228 10*3/uL (ref 150–440)
RBC: 3.35 MIL/uL — ABNORMAL LOW (ref 3.80–5.20)
RDW: 16.8 % — AB (ref 11.5–14.5)
WBC: 4.8 10*3/uL (ref 3.6–11.0)

## 2014-08-08 MED ORDER — CEPHALEXIN 500 MG PO CAPS: 500.0000 mg | ORAL_CAPSULE | Freq: Four times a day (QID) | ORAL | Status: DC

## 2014-08-08 MED ORDER — CEFTRIAXONE SODIUM IN DEXTROSE 20 MG/ML IV SOLN
1.0000 g | Freq: Once | INTRAVENOUS | Status: AC
  Administered 2014-08-08: 1 g via INTRAVENOUS

## 2014-08-08 MED ORDER — CEFTRIAXONE SODIUM IN DEXTROSE 20 MG/ML IV SOLN
INTRAVENOUS | Status: AC
  Administered 2014-08-08: 1 g via INTRAVENOUS
  Filled 2014-08-08: qty 50

## 2014-08-08 NOTE — Telephone Encounter (Signed)
Spoke to [name] who wanted to know if a plan has been developed to treat the pt's UTI.   Callback [phone]

## 2014-08-08 NOTE — ED Notes (Signed)
Pt informed to return if any life threatening symptoms occur.  

## 2014-08-08 NOTE — ED Provider Notes (Signed)
[facility] Emergency Department Provider Note  Time seen: 8:20 PM  I have reviewed the triage vital signs and the nursing notes.   HISTORY  Chief Complaint Urinary Tract Infection    HPI [name] is a 81 y.o. female with a past medical history of diabetes, hyperlipidemia, hypertension, COPD who presents the emergency department for urinary tract infection. According to the patient for the past 3 months she has had a urinary tract infection. She has been on 2 different courses of antibiotics without relief. She states her home health nurse sent off a urine culture earlier this week, and it resulted showing a "bad bacteria." She stated her home health nurse told her to come to the emergency department for IV antibiotics. Patient denies any fever. Denies any abdominal pain or back pain. She states occasionally she will have a burning sensation when urinating. Patient has been put on ciprofloxacin and levofloxacin without relief. Denies any other complaints at this time.     Past Medical History  Diagnosis Date  . DM2 (diabetes mellitus, type 2)     onset age 41 insulin dependent  . Hyperlipidemia   . COPD (chronic obstructive pulmonary disease)   . Peripheral vascular disease   . CAD (coronary artery disease)     a. MI 1988 s/p CABG in 1988, multiple PCI on SVGs; b. cath 2012: occluded native coronary arteries, patent LIMA to LAD (distal LAD dz), patent SVG-OM & occluded SVG-RCA which filled via left to right collats; c. cath 12/2013 patent LIMA-LAD & SVG-O. known occluded VG-RCA w/ L-R collats, no change since cath 2012  . Chronic systolic CHF (congestive heart failure)     a. 03/2012: EF 40-45%, mild lateral wall HK, mod inf HK, mod post wall HK, mild LVH, mild MR/TR   . Paroxysmal atrial fibrillation     a. CHADSVASc 7: yearly risk of CVA 9.6%;. b. on Eliquis 2.5 mg bid (age, SCr, borderline wt 62.9 Kg)   . HTN (hypertension)   . Yeast infection   .  UTI (urinary tract infection)     Patient Active Problem List   Diagnosis Date Noted  . Anemia 07/25/2014  . NSTEMI, initial episode of care 06/17/2014  . Pre-operative cardiovascular examination 02/27/2014  . Acute bronchitis 02/27/2014  . COPD (chronic obstructive pulmonary disease)   . DM2 (diabetes mellitus, type 2)   . Hyperlipidemia   . Chronic systolic heart failure 05/03/2013  . CAD (coronary artery disease)   . Peripheral vascular disease   . Paroxysmal atrial fibrillation   . HTN (hypertension)   . Atherosclerosis of native arteries of the extremities with intermittent claudication 03/19/2011    Past Surgical History  Procedure Laterality Date  . Abdominal hysterectomy    . Coronary artery bypass graft    . Appendectomy    . Cholecystectomy    . Ovary surgery    . Angioplasty / stenting femoral      Bilateral SFA stenting  . Femur surgery    . Cardiac catheterization      mc  . Cardiac catheterization  12/2013    Current Outpatient Rx  Name  Route  Sig  Dispense  Refill  . amiodarone (PACERONE) 200 MG tablet   Oral   Take 1 tablet (200 mg total) by mouth daily.   90 tablet   3   . apixaban (ELIQUIS) 2.5 MG TABS tablet   Oral   Take 1 tablet (2.5 mg total) by mouth 2 (  two) times daily.   60 tablet   6   . benazepril (LOTENSIN) 20 MG tablet      TAKE 1 TABLET (20 MG TOTAL) BY MOUTH DAILY.   30 tablet   5     No refills available   . carvedilol (COREG) 3.125 MG tablet   Oral   Take 1 tablet (3.125 mg total) by mouth 2 (two) times daily.   180 tablet   3   . gabapentin (NEURONTIN) 400 MG capsule   Oral   Take 400 mg by mouth 3 (three) times daily.         [name] HYDROcodone-acetaminophen (NORCO/VICODIN) 5-325 MG per tablet   Oral   Take 2 tablets by mouth every 4 (four) hours as needed for moderate pain.         [name] insulin glargine (LANTUS) 100 UNIT/ML injection   Subcutaneous   Inject 0.1 mLs (10 Units total) into the skin at bedtime.    10 mL   11   . insulin lispro (HUMALOG) 100 UNIT/ML injection   Subcutaneous   Inject 0.04 mLs (4 Units total) into the skin 3 (three) times daily before meals.   10 mL   11   . isosorbide mononitrate (IMDUR) 60 MG 24 hr tablet   Oral   Take 1.5 mg by mouth daily.         [name] levothyroxine (SYNTHROID, LEVOTHROID) 100 MCG tablet   Oral   Take 100 mcg by mouth daily before breakfast.         . nitroGLYCERIN (NITROSTAT) 0.4 MG SL tablet   Sublingual   Place 1 tablet (0.4 mg total) under the tongue every 5 (five) minutes as needed for chest pain.   30 tablet   0   . potassium chloride (K-DUR) 10 MEQ tablet   Oral   Take 10 mEq by mouth daily.          . pravastatin (PRAVACHOL) 40 MG tablet   Oral   Take 40 mg by mouth daily.         [name] torsemide (DEMADEX) 20 MG tablet   Oral   Take 1 tablet (20 mg total) by mouth 2 (two) times daily.   60 tablet   3     Allergies Contrast media; Tramadol; Valium; Iodine strong; and Penicillins  Family History  Problem Relation Age of Onset  . Diabetes Mother   . Heart disease Mother   . Hypertension Mother   . Heart disease Father   . Heart disease Brother     Social History History  Substance Use Topics  . Smoking status: Former Smoker -- 15 years    Types: Cigarettes    Quit date: 02/18/1988  . Smokeless tobacco: Never Used  . Alcohol Use: No    Review of Systems Constitutional: Negative for fever. Cardiovascular: Negative for chest pain. Respiratory: Negative for shortness of breath. Gastrointestinal: Negative for abdominal pain Genitourinary: Occasional dysuria/burning Musculoskeletal: Negative for back pain. Neurological: Negative for headaches, focal weakness or numbness. 10-point ROS otherwise negative.  ____________________________________________   PHYSICAL EXAM:  VITAL SIGNS: ED Triage Vitals  Enc Vitals Group     BP 08/08/14 1814 111/54 mmHg     Pulse Rate 08/08/14 1814 63     Resp 08/08/14  1814 18     Temp 08/08/14 1814 98.2 F (36.8 C)     Temp Source 08/08/14 1814 Oral     SpO2 08/08/14 1814 97 %  Weight 08/08/14 1814 140 lb (63.504 kg)     Height 08/08/14 1814  (1.549 m)     Head Cir --      Peak Flow --      Pain Score --      Pain Loc --      Pain Edu? --      Excl. in GC? --     Constitutional: Alert and oriented. Well appearing and in no distress. Eyes: Normal exam ENT   Mouth/Throat: Mucous membranes are moist. Cardiovascular: Normal rate, regular rhythm.  Respiratory: Normal respiratory effort without tachypnea nor retractions. Breath sounds are clear  Gastrointestinal: Soft and nontender. No distention.  There is no CVA tenderness. Musculoskeletal: Nontender with normal range of motion in all extremities.  Neurologic:  Normal speech and language. No gross focal neurologic deficits  Skin:  Skin is warm, dry and intact.  Psychiatric: Mood and affect are normal. Speech and behavior are normal.   ____________________________________________     INITIAL IMPRESSION / ASSESSMENT AND PLAN / ED COURSE  Pertinent labs & imaging results that were available during my care of the patient were reviewed by me and considered in my medical decision making (see chart for details).  Patient with significant urinary tract infection on urinalysis, blood work within normal limits, normal white blood cell count. Patient is afebrile, with normal vitals. Patient denies any abdominal pain or back pain. Nontender exam. We will dose the patient with IV Rocephin, send a urine culture and discharge patient on Keflex with primary care follow-up. Patient group to plan.  ____________________________________________   FINAL CLINICAL IMPRESSION(S) / ED DIAGNOSES  Urinary tract infection   Minna Antis, MD 08/08/14 2025

## 2014-08-08 NOTE — Telephone Encounter (Signed)
Pt was referred to urology in April. Her appt was on 06/07/2014.She did not go. She cancelled an appt at urology today. Per urology recommendation, pt must go to the ER for IV antibiotics. Spoke with [facility] RN Marchelle Folks and will call granddaughter [name] per her recommendation.    [name] was not available. LMTCB. Spoke with Talbert Forest directly. Pt advised to go to the ER for IV antibiotics. Pt states that she will go but she cannot get a ride until 8pm. [facility] ER called to alert them to patient arrival. Spoke with Administrator.

## 2014-08-08 NOTE — Discharge Instructions (Signed)

## 2014-08-08 NOTE — ED Notes (Signed)
Pt to triage with reports that she has been treated for UTI x 8 weeks, home health nurse sent UA to follow up and PCP told pt that she needed IV abx to help. Pt denies fevers, a/o x 4.

## 2014-08-08 NOTE — Telephone Encounter (Signed)
Are we supposed to be treating this? [facility]

## 2014-08-10 ENCOUNTER — Other Ambulatory Visit: Payer: Self-pay

## 2014-08-10 ENCOUNTER — Telehealth: Payer: Self-pay | Admitting: Family Medicine

## 2014-08-10 MED ORDER — TORSEMIDE 20 MG PO TABS: 20.0000 mg | ORAL_TABLET | Freq: Two times a day (BID) | ORAL | Status: DC

## 2014-08-10 NOTE — Telephone Encounter (Signed)
Pt went to ED Tuesday for UTI and was sent home on PO Keflex. Pt is unable to handle the medication.   Callback: [phone]

## 2014-08-10 NOTE — Telephone Encounter (Signed)
Spoke to [place] the nurse and advised her to go to ER. They will wait to see if you can recommend anything different or call in new antibiotic. Nurse also mentioned the ER did Urine Culture.

## 2014-08-10 NOTE — Telephone Encounter (Signed)
Refill sent for Torsemide.  

## 2014-08-11 ENCOUNTER — Telehealth: Payer: Self-pay | Admitting: Emergency Medicine

## 2014-08-11 ENCOUNTER — Inpatient Hospital Stay
Admission: EM | Admit: 2014-08-11 | Discharge: 2014-08-13 | DRG: 690 | Disposition: A | Discharge status: Home / Self Care | Payer: Medicare Other | Attending: Internal Medicine | Admitting: Internal Medicine

## 2014-08-11 DIAGNOSIS — Z8249 Family history of ischemic heart disease and other diseases of the circulatory system: Secondary | ICD-10-CM | POA: Diagnosis not present

## 2014-08-11 DIAGNOSIS — J449 Chronic obstructive pulmonary disease, unspecified: Secondary | ICD-10-CM | POA: Diagnosis present

## 2014-08-11 DIAGNOSIS — Z833 Family history of diabetes mellitus: Secondary | ICD-10-CM | POA: Diagnosis not present

## 2014-08-11 DIAGNOSIS — A499 Bacterial infection, unspecified: Secondary | ICD-10-CM

## 2014-08-11 DIAGNOSIS — Z880 Allergy status to penicillin: Secondary | ICD-10-CM | POA: Diagnosis not present

## 2014-08-11 DIAGNOSIS — I5022 Chronic systolic (congestive) heart failure: Secondary | ICD-10-CM | POA: Diagnosis present

## 2014-08-11 DIAGNOSIS — Z951 Presence of aortocoronary bypass graft: Secondary | ICD-10-CM | POA: Diagnosis not present

## 2014-08-11 DIAGNOSIS — Z90710 Acquired absence of both cervix and uterus: Secondary | ICD-10-CM | POA: Diagnosis not present

## 2014-08-11 DIAGNOSIS — E119 Type 2 diabetes mellitus without complications: Secondary | ICD-10-CM | POA: Diagnosis present

## 2014-08-11 DIAGNOSIS — M1610 Unilateral primary osteoarthritis, unspecified hip: Secondary | ICD-10-CM | POA: Diagnosis present

## 2014-08-11 DIAGNOSIS — Z87891 Personal history of nicotine dependence: Secondary | ICD-10-CM | POA: Diagnosis not present

## 2014-08-11 DIAGNOSIS — N390 Urinary tract infection, site not specified: Secondary | ICD-10-CM | POA: Diagnosis present

## 2014-08-11 DIAGNOSIS — Z91041 Radiographic dye allergy status: Secondary | ICD-10-CM | POA: Diagnosis not present

## 2014-08-11 DIAGNOSIS — I739 Peripheral vascular disease, unspecified: Secondary | ICD-10-CM | POA: Diagnosis present

## 2014-08-11 DIAGNOSIS — R0602 Shortness of breath: Secondary | ICD-10-CM

## 2014-08-11 DIAGNOSIS — Z79899 Other long term (current) drug therapy: Secondary | ICD-10-CM | POA: Diagnosis not present

## 2014-08-11 DIAGNOSIS — Z794 Long term (current) use of insulin: Secondary | ICD-10-CM | POA: Diagnosis not present

## 2014-08-11 DIAGNOSIS — Z888 Allergy status to other drugs, medicaments and biological substances status: Secondary | ICD-10-CM | POA: Diagnosis not present

## 2014-08-11 DIAGNOSIS — Z9889 Other specified postprocedural states: Secondary | ICD-10-CM | POA: Diagnosis not present

## 2014-08-11 DIAGNOSIS — E785 Hyperlipidemia, unspecified: Secondary | ICD-10-CM | POA: Diagnosis present

## 2014-08-11 DIAGNOSIS — I252 Old myocardial infarction: Secondary | ICD-10-CM | POA: Diagnosis not present

## 2014-08-11 DIAGNOSIS — Z95818 Presence of other cardiac implants and grafts: Secondary | ICD-10-CM | POA: Diagnosis not present

## 2014-08-11 DIAGNOSIS — I2510 Atherosclerotic heart disease of native coronary artery without angina pectoris: Secondary | ICD-10-CM | POA: Diagnosis present

## 2014-08-11 DIAGNOSIS — I10 Essential (primary) hypertension: Secondary | ICD-10-CM | POA: Diagnosis present

## 2014-08-11 DIAGNOSIS — I480 Paroxysmal atrial fibrillation: Secondary | ICD-10-CM | POA: Diagnosis present

## 2014-08-11 DIAGNOSIS — Z7401 Bed confinement status: Secondary | ICD-10-CM | POA: Diagnosis not present

## 2014-08-11 DIAGNOSIS — Z1612 Extended spectrum beta lactamase (ESBL) resistance: Secondary | ICD-10-CM

## 2014-08-11 DIAGNOSIS — B9620 Unspecified Escherichia coli [E. coli] as the cause of diseases classified elsewhere: Secondary | ICD-10-CM | POA: Diagnosis present

## 2014-08-11 DIAGNOSIS — Z9049 Acquired absence of other specified parts of digestive tract: Secondary | ICD-10-CM | POA: Diagnosis present

## 2014-08-11 LAB — URINE CULTURE: Culture: >=100000

## 2014-08-11 LAB — CBC WITH DIFFERENTIAL/PLATELET
BASOS ABS: 0 10*3/uL (ref 0–0.1)
Basophils Relative: 1 %
Eosinophils Absolute: 0.1 10*3/uL (ref 0–0.7)
Eosinophils Relative: 3 %
HCT: 28.1 % — ABNORMAL LOW (ref 35.0–47.0)
Hemoglobin: 8.8 g/dL — ABNORMAL LOW (ref 12.0–16.0)
LYMPHS PCT: 25 %
Lymphs Abs: 1.3 10*3/uL (ref 1.0–3.6)
MCH: 26.8 pg (ref 26.0–34.0)
MCHC: 31.3 g/dL — ABNORMAL LOW (ref 32.0–36.0)
MCV: 85.5 fL (ref 80.0–100.0)
Monocytes Absolute: 0.6 10*3/uL (ref 0.2–0.9)
Monocytes Relative: 11 %
NEUTROS ABS: 3.3 10*3/uL (ref 1.4–6.5)
Neutrophils Relative %: 60 %
PLATELETS: 225 10*3/uL (ref 150–440)
RBC: 3.29 MIL/uL — ABNORMAL LOW (ref 3.80–5.20)
RDW: 17 % — ABNORMAL HIGH (ref 11.5–14.5)
WBC: 5.4 10*3/uL (ref 3.6–11.0)

## 2014-08-11 LAB — COMPREHENSIVE METABOLIC PANEL
ALT: 9 U/L — ABNORMAL LOW (ref 14–54)
AST: 18 U/L (ref 15–41)
Albumin: 3.7 g/dL (ref 3.5–5.0)
Alkaline Phosphatase: 63 U/L (ref 38–126)
Anion gap: 11 (ref 5–15)
BILIRUBIN TOTAL: 0.4 mg/dL (ref 0.3–1.2)
BUN: 46 mg/dL — ABNORMAL HIGH (ref 6–20)
CALCIUM: 9 mg/dL (ref 8.9–10.3)
CO2: 27 mmol/L (ref 22–32)
Chloride: 103 mmol/L (ref 101–111)
Creatinine, Ser: 2.02 mg/dL — ABNORMAL HIGH (ref 0.44–1.00)
GFR calc Af Amer: 26 mL/min — ABNORMAL LOW (ref >60)
GFR calc non Af Amer: 22 mL/min — ABNORMAL LOW (ref >60)
Glucose, Bld: 170 mg/dL — ABNORMAL HIGH (ref 65–99)
Potassium: 4.7 mmol/L (ref 3.5–5.1)
SODIUM: 141 mmol/L (ref 135–145)
TOTAL PROTEIN: 6.8 g/dL (ref 6.5–8.1)

## 2014-08-11 LAB — URINALYSIS COMPLETE WITH MICROSCOPIC (ARMC ONLY)
Bilirubin Urine: NEGATIVE
Glucose, UA: NEGATIVE mg/dL
Ketones, ur: NEGATIVE mg/dL
NITRITE: POSITIVE — AB
PH: 5 (ref 5.0–8.0)
Protein, ur: NEGATIVE mg/dL
Specific Gravity, Urine: 1.009 (ref 1.005–1.030)

## 2014-08-11 LAB — GLUCOSE, CAPILLARY: Glucose-Capillary: 139 mg/dL — ABNORMAL HIGH (ref 65–99)

## 2014-08-11 MED ORDER — ACETAMINOPHEN 650 MG RE SUPP: 650.0000 mg | Freq: Four times a day (QID) | RECTAL | Status: DC | PRN

## 2014-08-11 MED ORDER — PRAVASTATIN SODIUM 20 MG PO TABS
40.0000 mg | ORAL_TABLET | Freq: Every day | ORAL | Status: DC
  Administered 2014-08-12 – 2014-08-13 (×2): 40 mg via ORAL
  Filled 2014-08-11 (×2): qty 2

## 2014-08-11 MED ORDER — TORSEMIDE 20 MG PO TABS
20.0000 mg | ORAL_TABLET | Freq: Two times a day (BID) | ORAL | Status: DC
  Administered 2014-08-12 – 2014-08-13 (×4): 20 mg via ORAL
  Filled 2014-08-11 (×2): qty 4
  Filled 2014-08-11 (×4): qty 1
  Filled 2014-08-11: qty 4

## 2014-08-11 MED ORDER — POTASSIUM CHLORIDE ER 10 MEQ PO TBCR
10.0000 meq | EXTENDED_RELEASE_TABLET | Freq: Every day | ORAL | Status: DC
  Administered 2014-08-12 – 2014-08-13 (×2): 10 meq via ORAL
  Filled 2014-08-11 (×5): qty 1

## 2014-08-11 MED ORDER — ONDANSETRON HCL 4 MG/2ML IJ SOLN
INTRAMUSCULAR | Status: AC
  Filled 2014-08-11: qty 2

## 2014-08-11 MED ORDER — SODIUM CHLORIDE 0.9 % IV SOLN
Freq: Once | INTRAVENOUS | Status: AC
  Administered 2014-08-11: 17:00:00 via INTRAVENOUS

## 2014-08-11 MED ORDER — SODIUM CHLORIDE 0.9 % IJ SOLN: 3.0000 mL | INTRAMUSCULAR | Status: DC | PRN

## 2014-08-11 MED ORDER — INSULIN ASPART 100 UNIT/ML ~~LOC~~ SOLN
10.0000 [IU] | Freq: Three times a day (TID) | SUBCUTANEOUS | Status: DC
  Administered 2014-08-12 – 2014-08-13 (×4): 10 [IU] via SUBCUTANEOUS
  Filled 2014-08-11 (×4): qty 10

## 2014-08-11 MED ORDER — SODIUM CHLORIDE 0.9 % IV SOLN: 500.0000 mg | Freq: Three times a day (TID) | INTRAVENOUS | Status: DC

## 2014-08-11 MED ORDER — CARVEDILOL 3.125 MG PO TABS
3.1250 mg | ORAL_TABLET | Freq: Two times a day (BID) | ORAL | Status: DC
  Administered 2014-08-12: 3.125 mg via ORAL
  Filled 2014-08-11: qty 1

## 2014-08-11 MED ORDER — MEROPENEM 500 MG IV SOLR
500.0000 mg | Freq: Two times a day (BID) | INTRAVENOUS | Status: DC
  Administered 2014-08-11 – 2014-08-12 (×2): 500 mg via INTRAVENOUS
  Filled 2014-08-11 (×3): qty 0.5

## 2014-08-11 MED ORDER — HYDROCODONE-ACETAMINOPHEN 5-325 MG PO TABS
2.0000 | ORAL_TABLET | Freq: Four times a day (QID) | ORAL | Status: DC | PRN
  Administered 2014-08-11 – 2014-08-13 (×4): 2 via ORAL
  Filled 2014-08-11 (×4): qty 2

## 2014-08-11 MED ORDER — BENAZEPRIL HCL 10 MG PO TABS
10.0000 mg | ORAL_TABLET | Freq: Every day | ORAL | Status: DC
  Administered 2014-08-12 – 2014-08-13 (×2): 10 mg via ORAL
  Filled 2014-08-11 (×2): qty 1

## 2014-08-11 MED ORDER — NITROGLYCERIN 0.4 MG SL SUBL: 0.4000 mg | SUBLINGUAL_TABLET | SUBLINGUAL | Status: DC | PRN

## 2014-08-11 MED ORDER — ACETAMINOPHEN 325 MG PO TABS: 650.0000 mg | ORAL_TABLET | Freq: Four times a day (QID) | ORAL | Status: DC | PRN

## 2014-08-11 MED ORDER — ONDANSETRON HCL 4 MG/2ML IJ SOLN: 4.0000 mg | Freq: Four times a day (QID) | INTRAMUSCULAR | Status: DC | PRN

## 2014-08-11 MED ORDER — SODIUM CHLORIDE 0.9 % IV SOLN: 250.0000 mL | INTRAVENOUS | Status: DC | PRN

## 2014-08-11 MED ORDER — ISOSORBIDE MONONITRATE ER 60 MG PO TB24
1.5000 mg | ORAL_TABLET | Freq: Every day | ORAL | Status: DC
  Administered 2014-08-12 – 2014-08-13 (×2): 30 mg via ORAL
  Filled 2014-08-11 (×2): qty 1

## 2014-08-11 MED ORDER — GABAPENTIN 300 MG PO CAPS
400.0000 mg | ORAL_CAPSULE | Freq: Three times a day (TID) | ORAL | Status: DC
  Administered 2014-08-11 – 2014-08-13 (×5): 400 mg via ORAL
  Filled 2014-08-11 (×5): qty 1

## 2014-08-11 MED ORDER — ONDANSETRON HCL 4 MG PO TABS: 4.0000 mg | ORAL_TABLET | Freq: Four times a day (QID) | ORAL | Status: DC | PRN

## 2014-08-11 MED ORDER — SODIUM CHLORIDE 0.9 % IJ SOLN
3.0000 mL | Freq: Two times a day (BID) | INTRAMUSCULAR | Status: DC
  Administered 2014-08-12 – 2014-08-13 (×3): 3 mL via INTRAVENOUS

## 2014-08-11 MED ORDER — INSULIN GLARGINE 100 UNIT/ML ~~LOC~~ SOLN
10.0000 [IU] | Freq: Every day | SUBCUTANEOUS | Status: DC
  Administered 2014-08-11 – 2014-08-12 (×2): 10 [IU] via SUBCUTANEOUS
  Filled 2014-08-11 (×3): qty 0.1

## 2014-08-11 MED ORDER — AMIODARONE HCL 200 MG PO TABS
200.0000 mg | ORAL_TABLET | Freq: Every day | ORAL | Status: DC
  Administered 2014-08-12 – 2014-08-13 (×2): 200 mg via ORAL
  Filled 2014-08-11 (×3): qty 1

## 2014-08-11 MED ORDER — LEVOTHYROXINE SODIUM 100 MCG PO TABS
100.0000 ug | ORAL_TABLET | Freq: Every day | ORAL | Status: DC
  Administered 2014-08-12 – 2014-08-13 (×2): 100 ug via ORAL
  Filled 2014-08-11 (×2): qty 1

## 2014-08-11 MED ORDER — APIXABAN 5 MG PO TABS
2.5000 mg | ORAL_TABLET | Freq: Two times a day (BID) | ORAL | Status: DC
  Administered 2014-08-11 – 2014-08-13 (×4): 2.5 mg via ORAL
  Filled 2014-08-11 (×4): qty 1

## 2014-08-11 MED ORDER — ONDANSETRON HCL 4 MG/2ML IJ SOLN
4.0000 mg | Freq: Once | INTRAMUSCULAR | Status: AC
  Administered 2014-08-11: 4 mg via INTRAVENOUS

## 2014-08-11 NOTE — Telephone Encounter (Signed)
Tell them we are awaiting Urine culture results from ER.  She is to take the antibiotic from ER until further notice.-jh

## 2014-08-11 NOTE — ED Notes (Addendum)
Patient resting in stretcher. Respirations even and unlabored. No obvious distress. Cardiac monitor in place. No needs/concerns verbalized at this time. Call bell within reach. Encouraged to call with needs. Will continue to monitor. 

## 2014-08-11 NOTE — ED Provider Notes (Signed)
[facility] Emergency Department Provider Note     Time seen: ----------------------------------------- 4:04 PM on 08/11/2014 -----------------------------------------    I have reviewed the triage vital signs and the nursing notes.   HISTORY  Chief Complaint Blood Infection    HPI [name] is a 81 y.o. female who presents to ER for generalized ill feeling. Patient states she was recently seen in the ED for urinary tract infection, had a urine culture was told she had a significant urine infection. Patient's been on antibiotics on off the last 8 weeks without relief for same. Urine results did reveal extended spectrum beta-lactamaseinfection. Patient is also had some vomiting and weakness. Nothing sees make her better or worse, symptoms are moderate to severe.   Past Medical History  Diagnosis Date  . DM2 (diabetes mellitus, type 2)     onset age 40 insulin dependent  . Hyperlipidemia   . COPD (chronic obstructive pulmonary disease)   . Peripheral vascular disease   . CAD (coronary artery disease)     a. MI 1988 s/p CABG in 1988, multiple PCI on SVGs; b. cath 2012: occluded native coronary arteries, patent LIMA to LAD (distal LAD dz), patent SVG-OM & occluded SVG-RCA which filled via left to right collats; c. cath 12/2013 patent LIMA-LAD & SVG-O. known occluded VG-RCA w/ L-R collats, no change since cath 2012  . Chronic systolic CHF (congestive heart failure)     a. 03/2012: EF 40-45%, mild lateral wall HK, mod inf HK, mod post wall HK, mild LVH, mild MR/TR   . Paroxysmal atrial fibrillation     a. CHADSVASc 7: yearly risk of CVA 9.6%;. b. on Eliquis 2.5 mg bid (age, SCr, borderline wt 62.9 Kg)   . HTN (hypertension)   . Yeast infection   . UTI (urinary tract infection)     Patient Active Problem List   Diagnosis Date Noted  . Anemia 07/25/2014  . NSTEMI, initial episode of care 06/17/2014  . Pre-operative cardiovascular examination  02/27/2014  . Acute bronchitis 02/27/2014  . COPD (chronic obstructive pulmonary disease)   . DM2 (diabetes mellitus, type 2)   . Hyperlipidemia   . Chronic systolic heart failure 05/03/2013  . CAD (coronary artery disease)   . Peripheral vascular disease   . Paroxysmal atrial fibrillation   . HTN (hypertension)   . Atherosclerosis of native arteries of the extremities with intermittent claudication 03/19/2011    Past Surgical History  Procedure Laterality Date  . Abdominal hysterectomy    . Coronary artery bypass graft    . Appendectomy    . Cholecystectomy    . Ovary surgery    . Angioplasty / stenting femoral      Bilateral SFA stenting  . Femur surgery    . Cardiac catheterization      mc  . Cardiac catheterization  12/2013    Allergies Contrast media; Tramadol; Valium; Iodine strong; and Penicillins  Social History History  Substance Use Topics  . Smoking status: Former Smoker -- 15 years    Types: Cigarettes    Quit date: 02/18/1988  . Smokeless tobacco: Never Used  . Alcohol Use: No    Review of Systems Constitutional: Negative for fever. Eyes: Negative for visual changes. ENT: Negative for sore throat. Cardiovascular: Negative for chest pain. Respiratory: Negative for shortness of breath. Gastrointestinal: Negative for abdominal pain, positive for vomiting Genitourinary: Negative for dysuria. Musculoskeletal: Negative for back pain. Skin: Negative for rash. Neurological: Negative for headaches, positive for weakness  10-point ROS otherwise negative.  ____________________________________________   PHYSICAL EXAM:  VITAL SIGNS: ED Triage Vitals  Enc Vitals Group     BP 08/11/14 1451 105/65 mmHg     Pulse Rate 08/11/14 1451 70     Resp 08/11/14 1451 18     Temp 08/11/14 1451 98.4 F (36.9 C)     Temp Source 08/11/14 1451 Oral     SpO2 08/11/14 1451 96 %     Weight 08/11/14 1451 140 lb (63.504 kg)     Height 08/11/14 1451  (1.549 m)      Head Cir --      Peak Flow --      Pain Score 08/11/14 1452 0     Pain Loc --      Pain Edu? --      Excl. in GC? --     Constitutional: Alert and oriented. Well appearing and in no distress. Eyes: Conjunctivae are normal. PERRL. Normal extraocular movements. ENT   Head: Normocephalic and atraumatic.   Nose: No congestion/rhinnorhea.   Mouth/Throat: Mucous membranes are moist.   Neck: No stridor. Hematological/Lymphatic/Immunilogical: No cervical lymphadenopathy. Cardiovascular: Normal rate, regular rhythm. Normal and symmetric distal pulses are present in all extremities. No murmurs, rubs, or gallops. Respiratory: Normal respiratory effort without tachypnea nor retractions. Breath sounds are clear and equal bilaterally. No wheezes/rales/rhonchi. Gastrointestinal: Soft and nontender. No distention. No abdominal bruits. There is no CVA tenderness. Musculoskeletal: Nontender with normal range of motion in all extremities. No joint effusions.  No lower extremity tenderness nor edema. Neurologic: Generalized weakness, nothing focal. Speech is normal. Skin:  Skin is warm, dry and intact. No rash noted. Psychiatric: Mood and affect are normal. Speech and behavior are normal. Patient exhibits appropriate insight and judgment. ____________________________________________  ED COURSE:  Pertinent labs & imaging results that were available during my care of the patient were reviewed by me and considered in my medical decision making (see chart for details). We will need to recheck labs and urine. Patient will likely need admission and IV meropenem ____________________________________________    LABS (pertinent positives/negatives)  Labs Reviewed  CBC WITH DIFFERENTIAL/PLATELET - Abnormal; Notable for the following:    RBC 3.29 (*)    Hemoglobin 8.8 (*)    HCT 28.1 (*)    MCHC 31.3 (*)    RDW 17.0 (*)    All other components within normal limits  COMPREHENSIVE METABOLIC PANEL  - Abnormal; Notable for the following:    Glucose, Bld 170 (*)    BUN 46 (*)    Creatinine, Ser 2.02 (*)    ALT 9 (*)    GFR calc non Af Amer 22 (*)    GFR calc Af Amer 26 (*)    All other components within normal limits  URINE CULTURE  URINALYSIS COMPLETEWITH MICROSCOPIC ([facility] ONLY)    RADIOLOGY Images were viewed by me  None  ____________________________________________  FINAL ASSESSMENT AND PLAN  ESBL infection  Plan: Patient with persistent UTI resistant to antibiotics by mouth. She'll be placed on IV meropenem will need hospitalization.   Emily Filbert, MD   Emily Filbert, MD 08/11/14 [phone]

## 2014-08-11 NOTE — Progress Notes (Signed)
ANTIBIOTIC CONSULT NOTE - INITIAL  Pharmacy Consult for Meropenem Indication: ESBL infection  Allergies  Allergen Reactions  . Contrast Media [Iodinated Diagnostic Agents] Shortness Of Breath  . Tramadol     Nausea   . Valium [Diazepam] Other (See Comments)    Elevated heart rate  . Iodine Strong [Iodine] Rash  . Penicillins Rash    Patient Measurements: Height:  (154.9 cm) Weight: 140 lb (63.504 kg) IBW/kg (Calculated) : 47.8 Adjusted Body Weight:   Vital Signs: Temp: 98.4 F (36.9 C) (06/24 1451) Temp Source: Oral (06/24 1451) BP: 105/65 mmHg (06/24 1451) Pulse Rate: 70 (06/24 1451) Intake/Output from previous day:   Intake/Output from this shift:    Labs:  Recent Labs  08/08/14 1818 08/11/14 1500  WBC 4.8 5.4  HGB 8.9* 8.8*  PLT 228 225  CREATININE 2.08* 2.02*   Estimated Creatinine Clearance: 19 mL/min (by C-G formula based on Cr of 2.02). No results for input(s): VANCOTROUGH, VANCOPEAK, VANCORANDOM, GENTTROUGH, GENTPEAK, GENTRANDOM, TOBRATROUGH, TOBRAPEAK, TOBRARND, AMIKACINPEAK, AMIKACINTROU, AMIKACIN in the last 72 hours.   Microbiology: Recent Results (from the past 720 hour(s))  Urine culture     Status: None   Collection Time: 08/08/14  8:03 PM  Result Value Ref Range Status   Specimen Description URINE, CLEAN CATCH  Final   Special Requests NONE  Final   Culture   Final    >=100,000 COLONIES/mL ESCHERICHIA COLI ESBL-EXTENDED SPECTRUM BETA LACTAMASE-THE ORGANISM IS RESISTANT TO PENICILLINS, CEPHALOSPORINS AND AZTREONAM ACCORDING TO CLSI M100-S15 VOL.25 N01 JAN 2005. CRITICAL RESULT CALLED TO, READ BACK BY AND VERIFIED WITH: GREG MOYER ON 08/11/14 AT 0830 BY JEF    Report Status 08/11/2014 FINAL  Final   Organism ID, Bacteria ESCHERICHIA COLI  Final      Susceptibility   Escherichia coli - MIC*    AMPICILLIN >=32 RESISTANT Resistant     CEFAZOLIN >=64 RESISTANT Resistant     CEFTRIAXONE >=64 RESISTANT Resistant     CIPROFLOXACIN >=4  RESISTANT Resistant     GENTAMICIN <=1 SENSITIVE Sensitive     IMIPENEM <=0.25 SENSITIVE Sensitive     NITROFURANTOIN 128 RESISTANT Resistant     TRIMETH/SULFA >=320 RESISTANT Resistant     CEFOXITIN <=4 SENSITIVE Sensitive     Extended ESBL POSITIVE Resistant     * >=100,000 COLONIES/mL ESCHERICHIA COLI    Medical History: Past Medical History  Diagnosis Date  . DM2 (diabetes mellitus, type 2)     onset age 24 insulin dependent  . Hyperlipidemia   . COPD (chronic obstructive pulmonary disease)   . Peripheral vascular disease   . CAD (coronary artery disease)     a. MI 1988 s/p CABG in 1988, multiple PCI on SVGs; b. cath 2012: occluded native coronary arteries, patent LIMA to LAD (distal LAD dz), patent SVG-OM & occluded SVG-RCA which filled via left to right collats; c. cath 12/2013 patent LIMA-LAD & SVG-O. known occluded VG-RCA w/ L-R collats, no change since cath 2012  . Chronic systolic CHF (congestive heart failure)     a. 03/2012: EF 40-45%, mild lateral wall HK, mod inf HK, mod post wall HK, mild LVH, mild MR/TR   . Paroxysmal atrial fibrillation     a. CHADSVASc 7: yearly risk of CVA 9.6%;. b. on Eliquis 2.5 mg bid (age, SCr, borderline wt 62.9 Kg)   . HTN (hypertension)   . Yeast infection   . UTI (urinary tract infection)     Medications:   (Not in a hospital  admission) Assessment: CrCl = 19 ml/min Pt with ESBL infection  Goal of Therapy:  resolution of infection  Plan:  Expected duration 7 days with resolution of temperature and/or normalization of WBC  Will start this pt on Meropenem 500 mg IV Q12H.   Arslan Kier D 08/11/2014,4:14 PM

## 2014-08-11 NOTE — ED Notes (Signed)
Pt reports was called by someone to come to the ED for a urine culture and possible admission for a urine infection. Pt reports has been on antibiotics for weeks with no relief.

## 2014-08-11 NOTE — H&P (Signed)
[facility] [place] [facility]   PATIENT NAME: [name]    MR#:  [id number]  DATE OF BIRTH:  April 12, 1931  DATE OF ADMISSION:  08/11/2014  PRIMARY CARE PHYSICIAN: Fidel Levy, MD   REQUESTING/REFERRING PHYSICIAN: Dr. Mayford Knife  CHIEF COMPLAINT:   Chief Complaint  Patient presents with  . Blood Infection    HISTORY OF PRESENT ILLNESS: [name]  is a 81 y.o. female with a known history of diabetes type 2, hyperlipidemia, COPD, peripheral vascular disease coronary artery disease, chronic systolic congestive heart failure and paroxysmal A. fib who states that she's not been feeling well for the past few months she has had some issues with her hip. And is not ambulatory according to her. She states that she has progressive weakness over the past few weeks. She came to the emergency room with same complaint had urine cultures and urinalysis which showed that she had a urinary tract infection. Patient's urine culture did grow ESBL producing Escherichia coli she was treated with Keflex because the cultures were not back. Patient reports that she is not feeling well and has been having some burning with urination and frequency. She also states that she has had lower extremity swelling and some abdominal swelling related to her congestive heart failure. Has not had any fevers or chills. Her shortness of breath is at baseline        PAST MEDICAL HISTORY:   Past Medical History  Diagnosis Date  . DM2 (diabetes mellitus, type 2)     onset age 89 insulin dependent  . Hyperlipidemia   . COPD (chronic obstructive pulmonary disease)   . Peripheral vascular disease   . CAD (coronary artery disease)     a. MI 1988 s/p CABG in 1988, multiple PCI on SVGs; b. cath 2012: occluded native coronary arteries, patent LIMA to LAD (distal LAD dz), patent SVG-OM & occluded SVG-RCA which filled via left to right collats; c. cath 12/2013 patent LIMA-LAD & SVG-O.  known occluded VG-RCA w/ L-R collats, no change since cath 2012  . Chronic systolic CHF (congestive heart failure)     a. 03/2012: EF 40-45%, mild lateral wall HK, mod inf HK, mod post wall HK, mild LVH, mild MR/TR   . Paroxysmal atrial fibrillation     a. CHADSVASc 7: yearly risk of CVA 9.6%;. b. on Eliquis 2.5 mg bid (age, SCr, borderline wt 62.9 Kg)   . HTN (hypertension)   . Yeast infection   . UTI (urinary tract infection)     PAST SURGICAL HISTORY:  Past Surgical History  Procedure Laterality Date  . Abdominal hysterectomy    . Coronary artery bypass graft    . Appendectomy    . Cholecystectomy    . Ovary surgery    . Angioplasty / stenting femoral      Bilateral SFA stenting  . Femur surgery    . Cardiac catheterization      mc  . Cardiac catheterization  12/2013    SOCIAL HISTORY:  History  Substance Use Topics  . Smoking status: Former Smoker -- 15 years    Types: Cigarettes    Quit date: 02/18/1988  . Smokeless tobacco: Never Used  . Alcohol Use: No    FAMILY HISTORY:  Family History  Problem Relation Age of Onset  . Diabetes Mother   . Heart disease Mother   . Hypertension Mother   . Heart disease Father   . Heart disease Brother  DRUG ALLERGIES:  Allergies  Allergen Reactions  . Contrast Media [Iodinated Diagnostic Agents] Shortness Of Breath  . Tramadol     Nausea   . Valium [Diazepam] Other (See Comments)    Elevated heart rate  . Iodine Strong [Iodine] Rash  . Penicillins Rash    REVIEW OF SYSTEMS:   CONSTITUTIONAL: No fever,postivie fatigue or positive weakness.  EYES: No blurred or double vision.  EARS, NOSE, AND THROAT: No tinnitus or ear pain.  RESPIRATORY: No cough, chronic shortness of breath, wheezing or hemoptysis.  CARDIOVASCULAR: No chest pain, orthopnea, edema.  GASTROINTESTINAL: No nausea, vomiting, diarrhea or abdominal pain.  GENITOURINARY: No dysuria, hematuria.  ENDOCRINE: No polyuria, nocturia,  HEMATOLOGY: No  anemia, easy bruising or bleeding SKIN: No rash or lesion. Swelling of lower ext MUSCULOSKELETAL:  hip joint pain or arthritis.   NEUROLOGIC: No tingling, numbness, weakness.  PSYCHIATRY: No anxiety or depression.   MEDICATIONS AT HOME:  Prior to Admission medications   Medication Sig Start Date End Date Taking? Authorizing Provider  amiodarone (PACERONE) 200 MG tablet Take 1 tablet (200 mg total) by mouth daily. 02/15/14  Yes Iran Ouch, MD  apixaban (ELIQUIS) 2.5 MG TABS tablet Take 1 tablet (2.5 mg total) by mouth 2 (two) times daily. 05/08/14  Yes Antonieta Iba, MD  benazepril (LOTENSIN) 20 MG tablet TAKE 1 TABLET (20 MG TOTAL) BY MOUTH DAILY. 02/09/13  Yes Iran Ouch, MD  carvedilol (COREG) 3.125 MG tablet Take 1 tablet (3.125 mg total) by mouth 2 (two) times daily. 02/15/14  Yes Iran Ouch, MD  gabapentin (NEURONTIN) 400 MG capsule Take 400 mg by mouth 3 (three) times daily.   Yes Historical Provider, MD  HYDROcodone-acetaminophen (NORCO/VICODIN) 5-325 MG per tablet Take 2 tablets by mouth every 6 (six) hours as needed for moderate pain.    Yes Historical Provider, MD  insulin glargine (LANTUS) 100 UNIT/ML injection Inject 0.1 mLs (10 Units total) into the skin at bedtime. 06/18/14  Yes Richard Renae Gloss, MD  insulin lispro (HUMALOG) 100 UNIT/ML injection Inject 0.04 mLs (4 Units total) into the skin 3 (three) times daily before meals. 06/18/14  Yes Alford Highland, MD  isosorbide mononitrate (IMDUR) 60 MG 24 hr tablet Take 1.5 mg by mouth daily.   Yes Historical Provider, MD  levothyroxine (SYNTHROID, LEVOTHROID) 100 MCG tablet Take 100 mcg by mouth daily before breakfast.   Yes Historical Provider, MD  potassium chloride (K-DUR) 10 MEQ tablet Take 10 mEq by mouth daily.    Yes Historical Provider, MD  pravastatin (PRAVACHOL) 40 MG tablet Take 40 mg by mouth daily.   Yes Historical Provider, MD  torsemide (DEMADEX) 20 MG tablet Take 1 tablet (20 mg total) by mouth 2 (two)  times daily. 08/10/14  Yes Iran Ouch, MD  cephALEXin (KEFLEX) 500 MG capsule Take 1 capsule (500 mg total) by mouth 4 (four) times daily. Patient not taking: Reported on 08/11/2014 08/08/14 08/18/14  Minna Antis, MD  nitroGLYCERIN (NITROSTAT) 0.4 MG SL tablet Place 1 tablet (0.4 mg total) under the tongue every 5 (five) minutes as needed for chest pain. 06/18/14   Alford Highland, MD      PHYSICAL EXAMINATION:   VITAL SIGNS: Blood pressure 121/69, pulse 71, temperature 98.4 F (36.9 C), temperature source Oral, resp. rate 17, height 5\' 1"  (1.549 m), weight 63.504 kg (140 lb), SpO2 94 %.  GENERAL:  81 y.o.-year-old patient lying in the bed with no acute distress.  EYES: Pupils equal, round, reactive  to light and accommodation. No scleral icterus. Extraocular muscles intact.  HEENT: Head atraumatic, normocephalic. Oropharynx and nasopharynx clear.  NECK:  Supple, no jugular venous distention. No thyroid enlargement, no tenderness.  LUNGS: Normal breath sounds bilaterally, no wheezing, rales,rhonchi or crepitation. No use of accessory muscles of respiration. Crackles at the bases CARDIOVASCULAR: S1, S2 normal. No murmurs, rubs, or gallops.  ABDOMEN: Soft, nontender, nondistended. Bowel sounds present. No organomegaly or mass.  EXTREMITIES: 2+ pedal edema, cyanosis, or clubbing.  NEUROLOGIC: Cranial nerves II through XII are intact. Muscle strength 5/5 in all extremities. Sensation intact. Gait not checked.  PSYCHIATRIC: The patient is alert and oriented x 3.  SKIN: No obvious rash, lesion, or ulcer.   LABORATORY PANEL:   CBC  Recent Labs Lab 08/08/14 1818 08/11/14 1500  WBC 4.8 5.4  HGB 8.9* 8.8*  HCT 29.1* 28.1*  PLT 228 225  MCV 86.9 85.5  MCH 26.6 26.8  MCHC 30.6* 31.3*  RDW 16.8* 17.0*  LYMPHSABS  --  1.3  MONOABS  --  0.6  EOSABS  --  0.1  BASOSABS  --  0.0    ------------------------------------------------------------------------------------------------------------------  Chemistries   Recent Labs Lab 08/08/14 1818 08/11/14 1500  NA 137 141  K 4.7 4.7  CL 105 103  CO2 28 27  GLUCOSE 209* 170*  BUN 41* 46*  CREATININE 2.08* 2.02*  CALCIUM 8.6* 9.0  AST 16 18  ALT 7* 9*  ALKPHOS 71 63  BILITOT 0.4 0.4   ------------------------------------------------------------------------------------------------------------------ estimated creatinine clearance is 19 mL/min (by C-G formula based on Cr of 2.02). ------------------------------------------------------------------------------------------------------------------ No results for input(s): TSH, T4TOTAL, T3FREE, THYROIDAB in the last 72 hours.  Invalid input(s): FREET3   Coagulation profile No results for input(s): INR, PROTIME in the last 168 hours. ------------------------------------------------------------------------------------------------------------------- No results for input(s): DDIMER in the last 72 hours. -------------------------------------------------------------------------------------------------------------------  Cardiac Enzymes No results for input(s): CKMB, TROPONINI, MYOGLOBIN in the last 168 hours.  Invalid input(s): CK ------------------------------------------------------------------------------------------------------------------ Invalid input(s): POCBNP  ---------------------------------------------------------------------------------------------------------------  Urinalysis    Component Value Date/Time   COLORURINE YELLOW* 08/11/2014 1617   APPEARANCEUR HAZY* 08/11/2014 1617   LABSPEC 1.009 08/11/2014 1617   PHURINE 5.0 08/11/2014 1617   GLUCOSEU NEGATIVE 08/11/2014 1617   HGBUR 2+* 08/11/2014 1617   BILIRUBINUR NEGATIVE 08/11/2014 1617   KETONESUR NEGATIVE 08/11/2014 1617   PROTEINUR NEGATIVE 08/11/2014 1617   UROBILINOGEN 0.2 11/24/2010  1858   NITRITE POSITIVE* 08/11/2014 1617   LEUKOCYTESUR 2+* 08/11/2014 1617     RADIOLOGY: No results found.  EKG: Orders placed or performed in visit on 07/25/14  . EKG 12-Lead    IMPRESSION AND PLAN: Patient is a 81 year old white female with multiple medical problems presents with generalized weakness noted to have ESBL producing UTI  1. Generalized weakness due to ESBL producing UTI: Patient will be treated with meropenem will repeat her urine cultures. I offered her physical therapy patient was upset and stated that she has been bedbound for the past 6 months and is not interested in any physical therapy. She also states that she is not interested in going to rehabilitation or nursing home's.  2. Chronic systolic congestive heart failure: Continue Demadex as taking at home.  3. History of atrial fibrillation continue amiodarone Eliquis  4. Hypertension continue benazepril  5 . Diabetes type 2 continue Lantus and pre-meal insulin patient will be placed on sliding scale  6. Miscellaneous: DVT prophylaxis with the Eliquis which he ordered he was on    All the records are  reviewed and case discussed with ED provider. Management plans discussed with the patient, family and they are in agreement.  CODE STATUS: Advance Directive Documentation        Most Recent Value   Type of Advance Directive  Living will, Healthcare Power of Attorney   Pre-existing out of facility DNR order (yellow form or pink MOST form)     "MOST" Form in Place?         TOTAL TIME TAKING CARE OF THIS PATIENT: 55 minutes.    Auburn Bilberry M.D on 08/11/2014 at 6:37 PM  Between 7am to 6pm - Pager - [phone]  After 6pm go to [email or web address] - password [email or web address] [facility]  [facility]  Office  [phone]  CC: Primary care physician; Fidel Levy, MD

## 2014-08-11 NOTE — Telephone Encounter (Signed)
Pt admitted per ER.

## 2014-08-12 LAB — CBC
HCT: 26 % — ABNORMAL LOW (ref 35.0–47.0)
Hemoglobin: 8 g/dL — ABNORMAL LOW (ref 12.0–16.0)
MCH: 26.5 pg (ref 26.0–34.0)
MCHC: 30.9 g/dL — ABNORMAL LOW (ref 32.0–36.0)
MCV: 85.9 fL (ref 80.0–100.0)
Platelets: 195 10*3/uL (ref 150–440)
RBC: 3.03 MIL/uL — AB (ref 3.80–5.20)
RDW: 16.8 % — ABNORMAL HIGH (ref 11.5–14.5)
WBC: 5 10*3/uL (ref 3.6–11.0)

## 2014-08-12 LAB — GLUCOSE, CAPILLARY
Glucose-Capillary: 127 mg/dL — ABNORMAL HIGH (ref 65–99)
Glucose-Capillary: 212 mg/dL — ABNORMAL HIGH (ref 65–99)
Glucose-Capillary: 132 mg/dL — ABNORMAL HIGH (ref 65–99)
Glucose-Capillary: 140 mg/dL — ABNORMAL HIGH (ref 65–99)
Glucose-Capillary: 167 mg/dL — ABNORMAL HIGH (ref 65–99)

## 2014-08-12 LAB — BASIC METABOLIC PANEL
Anion gap: 8 (ref 5–15)
BUN: 42 mg/dL — ABNORMAL HIGH (ref 6–20)
CHLORIDE: 106 mmol/L (ref 101–111)
CO2: 28 mmol/L (ref 22–32)
Calcium: 8.8 mg/dL — ABNORMAL LOW (ref 8.9–10.3)
Creatinine, Ser: 1.98 mg/dL — ABNORMAL HIGH (ref 0.44–1.00)
GFR calc Af Amer: 26 mL/min — ABNORMAL LOW (ref >60)
GFR calc non Af Amer: 23 mL/min — ABNORMAL LOW (ref >60)
Glucose, Bld: 131 mg/dL — ABNORMAL HIGH (ref 65–99)
POTASSIUM: 4.1 mmol/L (ref 3.5–5.1)
Sodium: 142 mmol/L (ref 135–145)

## 2014-08-12 MED ORDER — SODIUM CHLORIDE 0.9 % IV BOLUS (SEPSIS)
250.0000 mL | Freq: Once | INTRAVENOUS | Status: AC
  Administered 2014-08-12: 250 mL via INTRAVENOUS

## 2014-08-12 MED ORDER — CARVEDILOL 3.125 MG PO TABS
3.1250 mg | ORAL_TABLET | Freq: Every day | ORAL | Status: DC
  Administered 2014-08-13: 3.125 mg via ORAL
  Filled 2014-08-12: qty 1

## 2014-08-12 MED ORDER — ERTAPENEM SODIUM 1 G IJ SOLR
0.5000 g | INTRAMUSCULAR | Status: DC
  Administered 2014-08-12 – 2014-08-13 (×2): 0.5 g via INTRAMUSCULAR
  Filled 2014-08-12 (×3): qty 1

## 2014-08-12 MED ORDER — ERTAPENEM SODIUM 1 G IJ SOLR: 1.0000 g | INTRAMUSCULAR | Status: DC

## 2014-08-12 NOTE — Progress Notes (Signed)
Initial Nutrition Assessment      INTERVENTION:   (Nutrition Supplement Therapy) Will add magic cup BID for added nutrition.    NUTRITION DIAGNOSIS:  Inadequate oral intake related to acute illness as evidenced by per patient/family report.    GOAL:  Patient will meet greater than or equal to 90% of their needs    MONITOR:   (Energy intake, Electrolyte and renal profile, Glucose profile)  REASON FOR ASSESSMENT:  Consult Poor PO  ASSESSMENT:  Pt admitted with UTI, hip pain. Noted not ambulating, bedbound for the last 6 months per MD note  PMHx:  Past Medical History  Diagnosis Date  . DM2 (diabetes mellitus, type 2)     onset age 72 insulin dependent  . Hyperlipidemia   . COPD (chronic obstructive pulmonary disease)   . Peripheral vascular disease   . CAD (coronary artery disease)     a. MI 1988 s/p CABG in 1988, multiple PCI on SVGs; b. cath 2012: occluded native coronary arteries, patent LIMA to LAD (distal LAD dz), patent SVG-OM & occluded SVG-RCA which filled via left to right collats; c. cath 12/2013 patent LIMA-LAD & SVG-O. known occluded VG-RCA w/ L-R collats, no change since cath 2012  . Chronic systolic CHF (congestive heart failure)     a. 03/2012: EF 40-45%, mild lateral wall HK, mod inf HK, mod post wall HK, mild LVH, mild MR/TR   . Paroxysmal atrial fibrillation     a. CHADSVASc 7: yearly risk of CVA 9.6%;. b. on Eliquis 2.5 mg bid (age, SCr, borderline wt 62.9 Kg)   . HTN (hypertension)   . Yeast infection   . UTI (urinary tract infection)     Diet Order: heart healthy, carb modified  Current Nutrition: Pt eating breakfast when arrived in room this am. Reports will likely eat the toast and hash browns this am  Food/Nutrition-Related History: Pt reports intake has been decreased for the last 2 weeks. Typically eats toast for breakfast, skips lunch and maybe sandwich for supper.  Reports has not been feeling well and decreased  intake   Medications: aspart, lantus, K dur  Electrolyte/Renal Profile and Glucose Profile:   Recent Labs Lab 08/08/14 1818 08/11/14 1500 08/12/14 0539  NA 137 141 142  K 4.7 4.7 4.1  CL 105 103 106  CO2 28 27 28   BUN 41* 46* 42*  CREATININE 2.08* 2.02* 1.98*  CALCIUM 8.6* 9.0 8.8*  GLUCOSE 209* 170* 131*   Protein Profile:  Recent Labs Lab 08/08/14 1818 08/11/14 1500  ALBUMIN 3.8 3.7    Nutrition-Focused Physical Exam Findings: Nutrition-Focused physical exam completed. Findings are no fat depletion, mild/moderate depletion noted in thigh area, and unable to assess edema.    Weight Change: pt reports weight up and down due to fluid gain and loss Anthropometrics:  Height:  Ht Readings from Last 1 Encounters:  08/11/14 5\' 1"  (1.549 m)    Weight:  Wt Readings from Last 1 Encounters:  08/12/14 143 lb 8 oz (65.091 kg)        Wt Readings from Last 10 Encounters:  08/12/14 143 lb 8 oz (65.091 kg)  08/08/14 140 lb (63.504 kg)  07/25/14 140 lb (63.504 kg)  07/10/14 140 lb (63.504 kg)  06/18/14 144 lb 6.4 oz (65.499 kg)  05/25/14 140 lb (63.504 kg)  05/08/14 139 lb (63.05 kg)  04/25/14 139 lb (63.05 kg)  02/27/14 138 lb (62.596 kg)  01/17/14 140 lb 8 oz (63.73 kg)    BMI:  Body mass index is 27.13 kg/(m^2).  Estimated Nutritional Needs:  Kcal:  Using IBW of 48kg BEE 887 kcals (IF 1.0-1.2, AF 1.2) 6295-2841 kcals/d.   Protein:  (1.0-1.2 g/kg) 48-58 g/d  Fluid:  (25-19ml/kg) 1200-1451ml/d  Skin:  Reviewed, no issues  Diet Order:  Diet heart healthy/carb modified Room service appropriate?: Yes; Fluid consistency:: Thin  EDUCATION NEEDS:  No education needs identified at this time  No intake or output data in the 24 hours ending 08/12/14 0838   MODERATE Care Level Arieal Cuoco B. Freida Busman, RD, LDN [phone] (pager)

## 2014-08-12 NOTE — Progress Notes (Addendum)
MD notified of low BP still and patient "not feeling good at all" O2 sats are WDL, but patient remains SOB, blood sugar at 132 and 140. MD order to hold off on the insulin. bolus ordered.

## 2014-08-12 NOTE — Plan of Care (Signed)
Problem: Discharge Progression Outcomes Goal: Discharge plan in place and appropriate Outcome: Progressing Patient is a High Fall Risk--wheelchair bound at home, multiple falls Full Code  Lives at home with granddaughter, currently out of town. Patient has chronic pain in left hip and leg. States she has poor perfusion.  Hx of Diabetes, COPD, peripheral vascular disease, chronic systolic CHF, paroxysmal atrial fibrillation, controlled on home medications. Contact isolation for ESBL in the urine. Goal: Other Discharge Outcomes/Goals Outcome: Progressing Patient is alert and oriented, bedbound. States she is unable to tell when she urinates. C/o pain in her left leg and hip, norco given. Patient states she has a poor appetite, dietary consult placed. Resting quietly, VSS.

## 2014-08-12 NOTE — Care Management Note (Signed)
Case Management Note  Patient Details  Name: [name] MRN: [id number] Date of Birth: 1931/03/30  Subjective/Objective:         Call to Dr Allena Katz requesting script for Ertapenem 0.5g IM q 24 hours IM x 7 days. Dr Allena Katz stated that she will provide a prescription tomorrow. Called [facility] [place] [facility], [facility] on [address]  Ph: [phone] and the pharmacist reports that they do not carry this medication and cannot order it. Will follow up with [facility] pharmacy to see whether they carry this medication or can order it.         Action/Plan:   Expected Discharge Date:                  Expected Discharge Plan:     In-House Referral:     Discharge planning Services     Post Acute Care Choice:    Choice offered to:     DME Arranged:    DME Agency:     HH Arranged:    HH Agency:     Status of Service:     Medicare Important Message Given:    Date Medicare IM Given:    Medicare IM give by:    Date Additional Medicare IM Given:    Additional Medicare Important Message give by:     If discussed at Long Length of Stay Meetings, dates discussed:    Additional Comments:  Lenzy Kerschner A, RN 08/12/2014, 12:01 PM

## 2014-08-12 NOTE — Plan of Care (Addendum)
Problem: Discharge Progression Outcomes Goal: Other Discharge Outcomes/Goals Outcome: Progressing Plan of care progress to goal:  UTI - pt on invanz IM. On contact precautions for ESBL in urine.  BP low this afternoon. MD aware. 250 ml bolus ordered. BP increased slightly. No other symptoms at this time. Bladder scanned pt, got . Will monitor.

## 2014-08-12 NOTE — Care Management Note (Signed)
Case Management Note  Patient Details  Name: [name] MRN: [id number] Date of Birth: 1931-05-28  Subjective/Objective:      Have called [facility], [facility], and [facility] about obtaining ertapenem for [name]. None of these pharmacies carry it and do not want to order it because it has to be ordered in bulk. Per the [facility] hospital pharmacy, local pharmacies can order this medication through the [facility] pharmacy who has ertapenem in stock. It will require pre-authorization which is not available from Sara Castaneda insurance carrier on the weekend. Will revisit this on Sunday after an actual prescription is written.              Action/Plan:   Expected Discharge Date:                  Expected Discharge Plan:     In-House Referral:     Discharge planning Services     Post Acute Care Choice:    Choice offered to:     DME Arranged:    DME Agency:     HH Arranged:    HH Agency:     Status of Service:     Medicare Important Message Given:    Date Medicare IM Given:    Medicare IM give by:    Date Additional Medicare IM Given:    Additional Medicare Important Message give by:     If discussed at Long Length of Stay Meetings, dates discussed:    Additional Comments:  Lancelot Alyea A, RN 08/12/2014, 2:28 PM

## 2014-08-12 NOTE — Progress Notes (Signed)
MD notified of low BP. Ordered to hold second dose of coreg tonight and monitor for now due to asymptomatic and hx of CHF.

## 2014-08-12 NOTE — Progress Notes (Signed)
MEDICATION RELATED CONSULT NOTE - INITIAL   Renal dose adjustment for ertapenem  Allergies  Allergen Reactions  . Contrast Media [Iodinated Diagnostic Agents] Shortness Of Breath  . Tramadol     Nausea   . Valium [Diazepam] Other (See Comments)    Elevated heart rate  . Iodine Strong [Iodine] Rash  . Penicillins Rash    Patient Measurements: Height:  (154.9 cm) Weight: 143 lb 8 oz (65.091 kg) IBW/kg (Calculated) : 47.8  Vital Signs: Temp: 98.2 F (36.8 C) (06/25 0848) Temp Source: Oral (06/25 0848) BP: 106/66 mmHg (06/25 0848) Pulse Rate: 68 (06/25 0848) Intake/Output from previous day:   Intake/Output from this shift:    Labs:  Recent Labs  08/11/14 1500 08/12/14 0539  WBC 5.4 5.0  HGB 8.8* 8.0*  HCT 28.1* 26.0*  PLT 225 195  CREATININE 2.02* 1.98*  ALBUMIN 3.7  --   PROT 6.8  --   AST 18  --   ALT 9*  --   ALKPHOS 63  --   BILITOT 0.4  --    Estimated Creatinine Clearance: 19.6 mL/min (by C-G formula based on Cr of 1.98).   Microbiology: Recent Results (from the past 720 hour(s))  Urine culture     Status: None   Collection Time: 08/08/14  8:03 PM  Result Value Ref Range Status   Specimen Description URINE, CLEAN CATCH  Final   Special Requests NONE  Final   Culture   Final    >=100,000 COLONIES/mL ESCHERICHIA COLI ESBL-EXTENDED SPECTRUM BETA LACTAMASE-THE ORGANISM IS RESISTANT TO PENICILLINS, CEPHALOSPORINS AND AZTREONAM ACCORDING TO CLSI M100-S15 VOL.25 N01 JAN 2005. CRITICAL RESULT CALLED TO, READ BACK BY AND VERIFIED WITH: GREG MOYER ON 08/11/14 AT 0830 BY JEF    Report Status 08/11/2014 FINAL  Final   Organism ID, Bacteria ESCHERICHIA COLI  Final      Susceptibility   Escherichia coli - MIC*    AMPICILLIN >=32 RESISTANT Resistant     CEFAZOLIN >=64 RESISTANT Resistant     CEFTRIAXONE >=64 RESISTANT Resistant     CIPROFLOXACIN >=4 RESISTANT Resistant     GENTAMICIN <=1 SENSITIVE Sensitive     IMIPENEM <=0.25 SENSITIVE Sensitive    NITROFURANTOIN 128 RESISTANT Resistant     TRIMETH/SULFA >=320 RESISTANT Resistant     CEFOXITIN <=4 SENSITIVE Sensitive     Extended ESBL POSITIVE Resistant     * >=100,000 COLONIES/mL ESCHERICHIA COLI  Urine culture     Status: None (Preliminary result)   Collection Time: 08/11/14  4:17 PM  Result Value Ref Range Status   Specimen Description URINE, RANDOM  Final   Special Requests NONE  Final   Culture   Final    >=100,000 COLONIES/mL GRAM NEGATIVE RODS IDENTIFICATION TO FOLLOW SUSCEPTIBILITIES TO FOLLOW    Report Status PENDING  Incomplete    Medical History: Past Medical History  Diagnosis Date  . DM2 (diabetes mellitus, type 2)     onset age 18 insulin dependent  . Hyperlipidemia   . COPD (chronic obstructive pulmonary disease)   . Peripheral vascular disease   . CAD (coronary artery disease)     a. MI 1988 s/p CABG in 1988, multiple PCI on SVGs; b. cath 2012: occluded native coronary arteries, patent LIMA to LAD (distal LAD dz), patent SVG-OM & occluded SVG-RCA which filled via left to right collats; c. cath 12/2013 patent LIMA-LAD & SVG-O. known occluded VG-RCA w/ L-R collats, no change since cath 2012  . Chronic systolic CHF (congestive  heart failure)     a. 03/2012: EF 40-45%, mild lateral wall HK, mod inf HK, mod post wall HK, mild LVH, mild MR/TR   . Paroxysmal atrial fibrillation     a. CHADSVASc 7: yearly risk of CVA 9.6%;. b. on Eliquis 2.5 mg bid (age, SCr, borderline wt 62.9 Kg)   . HTN (hypertension)   . Yeast infection   . UTI (urinary tract infection)     Pt ordered ertapenem 1 g IM q24h. Per hospital policy, will renally adjust to ertapenem 0.5 g IM q24h for CrCl <30 ml/min.  Marty Heck 08/12/2014,11:06 AM

## 2014-08-12 NOTE — Progress Notes (Signed)
[facility] - [place] at [facility]   PATIENT NAME: [name]    MR#:  [id number]  DATE OF BIRTH:  05/04/1931  SUBJECTIVE:  Came in feeling weak with dysuria.   REVIEW OF SYSTEMS:   Review of Systems  Constitutional: Negative for fever, chills and weight loss.  HENT: Negative for ear discharge, ear pain and nosebleeds.   Eyes: Negative for blurred vision, pain and discharge.  Respiratory: Negative for sputum production, shortness of breath, wheezing and stridor.   Cardiovascular: Negative for chest pain, palpitations, orthopnea and PND.  Gastrointestinal: Negative for nausea, vomiting, abdominal pain and diarrhea.  Genitourinary: Positive for dysuria. Negative for urgency and frequency.  Musculoskeletal: Negative for back pain and joint pain.  Neurological: Positive for weakness. Negative for sensory change, speech change and focal weakness.  Psychiatric/Behavioral: Negative for depression. The patient is not nervous/anxious.    Tolerating Diet:yes Tolerating PT: no bed bound  DRUG ALLERGIES:   Allergies  Allergen Reactions  . Contrast Media [Iodinated Diagnostic Agents] Shortness Of Breath  . Tramadol     Nausea   . Valium [Diazepam] Other (See Comments)    Elevated heart rate  . Iodine Strong [Iodine] Rash  . Penicillins Rash    VITALS:  Blood pressure 89/48, pulse 59, temperature 98.2 F (36.8 C), temperature source Oral, resp. rate 20, height  (1.549 m), weight 65.091 kg (143 lb 8 oz), SpO2 96 %.  PHYSICAL EXAMINATION:   Physical Exam  GENERAL:  81 y.o.-year-old patient lying in the bed with no acute distress.  EYES: Pupils equal, round, reactive to light and accommodation. No scleral icterus. Extraocular muscles intact.  HEENT: Head atraumatic, normocephalic. Oropharynx and nasopharynx clear.  NECK:  Supple, no jugular venous distention. No thyroid enlargement, no tenderness.  LUNGS: Normal breath sounds bilaterally, no  wheezing, rales, rhonchi. No use of accessory muscles of respiration.  CARDIOVASCULAR: S1, S2 normal. No murmurs, rubs, or gallops.  ABDOMEN: Soft, nontender, nondistended. Bowel sounds present. No organomegaly or mass.  EXTREMITIES: No cyanosis, clubbing or edema b/l.    NEUROLOGIC: Cranial nerves II through XII are intact. Bilateral LWE weakness, ctracture+,mild PSYCHIATRIC: The patient is alert and oriented x 3.  SKIN: No obvious rash, lesion, or ulcer.    LABORATORY PANEL:   CBC  Recent Labs Lab 08/12/14 0539  WBC 5.0  HGB 8.0*  HCT 26.0*  PLT 195    Chemistries   Recent Labs Lab 08/11/14 1500 08/12/14 0539  NA 141 142  K 4.7 4.1  CL 103 106  CO2 27 28  GLUCOSE 170* 131*  BUN 46* 42*  CREATININE 2.02* 1.98*  CALCIUM 9.0 8.8*  AST 18  --   ALT 9*  --   ALKPHOS 63  --   BILITOT 0.4  --     ASSESSMENT AND PLAN:   81 year old white female with multiple medical problems presents with generalized weakness noted to have ESBL producing UTI  1. Generalized weakness due to ESBL producing UTI: -on IV meropenem --->will change to renal dose IM ertapenem(can be given home) -BC neg -wbc normal-no fever  -2. Chronic systolic congestive heart failure: Continue Demadex as taking at home. -low dose coreg and benazepril -hold bp meds if bp remains on the lower side  3. History of atrial fibrillation continue amiodarone and Eliquis  4. Hypertension continue benazepril  5 . Diabetes type 2 continue Lantus and pre-meal insulin patient will be placed on sliding scale  6. Miscellaneous: DVT prophylaxis  with the Eliquis   Overall improving. Will resume HH at d/c. Pt is bed bound due to severe DJD hip joint. Taken care by grand-dter.  Case discussed with Care Management/Social Worker. Management plans discussed with the patient, family and they are in agreement.  CODE STATUS: FULL  DVT Prophylaxis: eliquis  TOTAL TIME TAKING CARE OF THIS PATIENT: 35 minutes.  >50%  time spent on counselling and coordination of care  POSSIBLE D/C IN 1-2 DAYS, DEPENDING ON CLINICAL CONDITION.   Fidelis Loth M.D on 08/12/2014 at 4:25 PM  Between 7am to 6pm - Pager - [phone]  After 6pm go to [email or web address] - password [email or web address] [facility]  [place] [facility]  Office  [phone]  CC: Primary care physician; Fidel Levy, MD

## 2014-08-13 ENCOUNTER — Inpatient Hospital Stay: Payer: Medicare Other

## 2014-08-13 LAB — THYROID PANEL WITH TSH
FREE THYROXINE INDEX: 3.9 (ref 1.2–4.9)
T3 Uptake Ratio: 36 % (ref 24–39)
T4, Total: 10.8 ug/dL (ref 4.5–12.0)
TSH: 1.04 u[IU]/mL (ref 0.450–4.500)

## 2014-08-13 LAB — URINE CULTURE: Culture: >=100000

## 2014-08-13 LAB — GLUCOSE, CAPILLARY
Glucose-Capillary: 157 mg/dL — ABNORMAL HIGH (ref 65–99)
Glucose-Capillary: 103 mg/dL — ABNORMAL HIGH (ref 65–99)
Glucose-Capillary: 77 mg/dL (ref 65–99)

## 2014-08-13 MED ORDER — ERTAPENEM SODIUM 1 G IJ SOLR: 0.5000 g | INTRAMUSCULAR | Status: DC

## 2014-08-13 MED ORDER — ALPRAZOLAM 0.25 MG PO TABS
0.2500 mg | ORAL_TABLET | Freq: Once | ORAL | Status: AC
  Administered 2014-08-13: 08:00:00 0.25 mg via ORAL
  Filled 2014-08-13: qty 1

## 2014-08-13 NOTE — Discharge Instructions (Signed)
Resume HH services with [facility] as before

## 2014-08-13 NOTE — Care Management Note (Signed)
Case Management Note  Patient Details  Name: [name] MRN: [id number] Date of Birth: 01-14-1932  Subjective/Objective:          [facility]  Ph: [phone] has agreed to obtain Invanz IM from the [facility] hospital pharmacy and provide it for [name] tomorrow as soon as the family brings in the original prescription. The [facility] reports that they have no way to bill insurance and that they can only give this drug directly to a patient as charity. Therefore, a pharmacy who can bill [name] must provide the drug directly to [name].  A  Resumption of Care order was faxed to [facility] today. Spoke with [name], niece, who will transport [name] home later today.        Action/Plan:   Expected Discharge Date:                  Expected Discharge Plan:     In-House Referral:     Discharge planning Services     Post Acute Care Choice:    Choice offered to:     DME Arranged:    DME Agency:     HH Arranged:    HH Agency:     Status of Service:     Medicare Important Message Given:    Date Medicare IM Given:    Medicare IM give by:    Date Additional Medicare IM Given:    Additional Medicare Important Message give by:     If discussed at Long Length of Stay Meetings, dates discussed:    Additional Comments:  Dyanara Cozza A, RN 08/13/2014, 1:58 PM

## 2014-08-13 NOTE — Plan of Care (Signed)
Problem: Discharge Progression Outcomes Goal: Discharge plan in place and appropriate Outcome: Progressing Lives at home with granddaughter, currently out of town. Patient has chronic pain in left hip and leg. States she has poor perfusion.   Hx of Diabetes, COPD, peripheral vascular disease, chronic systolic CHF, paroxysmal atrial fibrillation, controlled on home medications. Contact isolation for ESBL in the urine. Goal: Other Discharge Outcomes/Goals Outcome: Progressing Plan of care progress to goal Pain control: minimal pain. Did not require any pain medication. Hemodynamically stable: Pt got a bolus yesterday on day shift for hypotension. B/p stable now. Diet: Pt tolerating her diet. Activity: Pt declined activity, used the bedpan with one assist.

## 2014-08-13 NOTE — Discharge Summary (Signed)
[facility] - [place] at [facility]   PATIENT NAME: [name]    MR#:  [id number]  DATE OF BIRTH:  14-Mar-1931  DATE OF ADMISSION:  08/11/2014 ADMITTING PHYSICIAN: Auburn Bilberry, MD  DATE OF DISCHARGE: 08/13/2014  PRIMARY CARE PHYSICIAN: Fidel Levy, MD    ADMISSION DIAGNOSIS:  ESBL (extended spectrum beta-lactamase) producing bacteria infection [A49.9, Z16.12]  DISCHARGE DIAGNOSIS:  ESBL UTI  SECONDARY DIAGNOSIS:   Past Medical History  Diagnosis Date  . DM2 (diabetes mellitus, type 2)     onset age 89 insulin dependent  . Hyperlipidemia   . COPD (chronic obstructive pulmonary disease)   . Peripheral vascular disease   . CAD (coronary artery disease)     a. MI 1988 s/p CABG in 1988, multiple PCI on SVGs; b. cath 2012: occluded native coronary arteries, patent LIMA to LAD (distal LAD dz), patent SVG-OM & occluded SVG-RCA which filled via left to right collats; c. cath 12/2013 patent LIMA-LAD & SVG-O. known occluded VG-RCA w/ L-R collats, no change since cath 2012  . Chronic systolic CHF (congestive heart failure)     a. 03/2012: EF 40-45%, mild lateral wall HK, mod inf HK, mod post wall HK, mild LVH, mild MR/TR   . Paroxysmal atrial fibrillation     a. CHADSVASc 7: yearly risk of CVA 9.6%;. b. on Eliquis 2.5 mg bid (age, SCr, borderline wt 62.9 Kg)   . HTN (hypertension)   . Yeast infection   . UTI (urinary tract infection)     HOSPITAL COURSE:    81 year old white female with multiple medical problems presents with generalized weakness noted to have ESBL producing UTI  1. Generalized weakness due to ESBL producing UTI: -on IV meropenem --->now on renal dose IM ertapenem(can be given home) -BC neg -wbc normal-no fever  -2. Chronic systolic congestive heart failure: Continue Demadex as taking at home. -low dose coreg and benazepril -sats 100% on RA  3. History of atrial fibrillation continue amiodarone and Eliquis  4.  Hypertension continue benazepril  5 . Diabetes type 2 continue Lantus and pre-meal insulin patient will be placed on sliding scale  6. Miscellaneous: DVT prophylaxis with the Eliquis   Overall improving. Will resume HH at d/c. Pt is bed bound due to severe DJD hip joint. Taken care by grand-dter. DISCHARGE CONDITIONS:   fair  DRUG ALLERGIES:   Allergies  Allergen Reactions  . Contrast Media [Iodinated Diagnostic Agents] Shortness Of Breath  . Tramadol     Nausea   . Valium [Diazepam] Other (See Comments)    Elevated heart rate  . Iodine Strong [Iodine] Rash  . Penicillins Rash    DISCHARGE MEDICATIONS:   Current Discharge Medication List    START taking these medications   Details  ertapenem (INVANZ) 1 G injection Inject 0.5 g into the muscle daily. For 7 days Qty: 7 vial, Refills: 0      CONTINUE these medications which have NOT CHANGED   Details  amiodarone (PACERONE) 200 MG tablet Take 1 tablet (200 mg total) by mouth daily. Qty: 90 tablet, Refills: 3    apixaban (ELIQUIS) 2.5 MG TABS tablet Take 1 tablet (2.5 mg total) by mouth 2 (two) times daily. Qty: 60 tablet, Refills: 6    benazepril (LOTENSIN) 20 MG tablet TAKE 1 TABLET (20 MG TOTAL) BY MOUTH DAILY. Qty: 30 tablet, Refills: 5    carvedilol (COREG) 3.125 MG tablet Take 1 tablet (3.125 mg total) by mouth 2 (two) times daily.  Qty: 180 tablet, Refills: 3    gabapentin (NEURONTIN) 400 MG capsule Take 400 mg by mouth 3 (three) times daily.    HYDROcodone-acetaminophen (NORCO/VICODIN) 5-325 MG per tablet Take 2 tablets by mouth every 6 (six) hours as needed for moderate pain.     insulin glargine (LANTUS) 100 UNIT/ML injection Inject 0.1 mLs (10 Units total) into the skin at bedtime. Qty: 10 mL, Refills: 11    insulin lispro (HUMALOG) 100 UNIT/ML injection Inject 0.04 mLs (4 Units total) into the skin 3 (three) times daily before meals. Qty: 10 mL, Refills: 11    isosorbide mononitrate (IMDUR) 60 MG 24 hr  tablet Take 1.5 mg by mouth daily.    levothyroxine (SYNTHROID, LEVOTHROID) 100 MCG tablet Take 100 mcg by mouth daily before breakfast.    potassium chloride (K-DUR) 10 MEQ tablet Take 10 mEq by mouth daily.     pravastatin (PRAVACHOL) 40 MG tablet Take 40 mg by mouth daily.    torsemide (DEMADEX) 20 MG tablet Take 1 tablet (20 mg total) by mouth 2 (two) times daily. Qty: 180 tablet, Refills: 3    nitroGLYCERIN (NITROSTAT) 0.4 MG SL tablet Place 1 tablet (0.4 mg total) under the tongue every 5 (five) minutes as needed for chest pain. Qty: 30 tablet, Refills: 0      STOP taking these medications     cephALEXin (KEFLEX) 500 MG capsule          DISCHARGE INSTRUCTIONS:   Resume HH services thru [facility]  If you experience worsening of your admission symptoms, develop shortness of breath, life threatening emergency, suicidal or homicidal thoughts you must seek medical attention immediately by calling 911 or calling your MD immediately  if symptoms less severe.  You Must read complete instructions/literature along with all the possible adverse reactions/side effects for all the Medicines you take and that have been prescribed to you. Take any new Medicines after you have completely understood and accept all the possible adverse reactions/side effects.   Please note  You were cared for by a hospitalist during your hospital stay. If you have any questions about your discharge medications or the care you received while you were in the hospital after you are discharged, you can call the unit and asked to speak with the hospitalist on call if the hospitalist that took care of you is not available. Once you are discharged, your primary care physician will handle any further medical issues. Please note that NO REFILLS for any discharge medications will be authorized once you are discharged, as it is imperative that you return to your primary care physician (or establish a relationship with a  primary care physician if you do not have one) for your aftercare needs so that they can reassess your need for medications and monitor your lab values. Today   SUBJECTIVE  Doing well  VITAL SIGNS:  Blood pressure 102/73, pulse 72, temperature 98.5 F (36.9 C), temperature source Oral, resp. rate 20, height 5\' 1"  (1.549 m), weight 69.037 kg (152 lb 3.2 oz), SpO2 99 %.  I/O:   Intake/Output Summary (Last 24 hours) at 08/13/14 1044 Last data filed at 08/12/14 2159  Gross per 24 hour  Intake      0 ml  Output    300 ml  Net   -300 ml    PHYSICAL EXAMINATION:  GENERAL:  81 y.o.-year-old patient lying in the bed with no acute distress.  EYES: Pupils equal, round, reactive to light and accommodation. No scleral  icterus. Extraocular muscles intact.  HEENT: Head atraumatic, normocephalic. Oropharynx and nasopharynx clear.  NECK:  Supple, no jugular venous distention. No thyroid enlargement, no tenderness.  LUNGS: Normal breath sounds bilaterally, no wheezing, rales,rhonchi or crepitation. No use of accessory muscles of respiration.  CARDIOVASCULAR: S1, S2 normal. No murmurs, rubs, or gallops.  ABDOMEN: Soft, non-tender, non-distended. Bowel sounds present. No organomegaly or mass.  EXTREMITIES: No pedal edema, cyanosis, or clubbing.  NEUROLOGIC: Cranial nerves II through XII are intact. Muscle strength 5/5 in all extremities. Sensation intact. Gait not checked.  PSYCHIATRIC: The patient is alert and oriented x 3.  SKIN: No obvious rash, lesion  Contractures in LE due to bed biound status.   DATA REVIEW:   CBC   Recent Labs Lab 08/12/14 0539  WBC 5.0  HGB 8.0*  HCT 26.0*  PLT 195    Chemistries   Recent Labs Lab 08/11/14 1500 08/12/14 0539  NA 141 142  K 4.7 4.1  CL 103 106  CO2 27 28  GLUCOSE 170* 131*  BUN 46* 42*  CREATININE 2.02* 1.98*  CALCIUM 9.0 8.8*  AST 18  --   ALT 9*  --   ALKPHOS 63  --   BILITOT 0.4  --     Microbiology Results   Recent Results  (from the past 240 hour(s))  Urine culture     Status: None   Collection Time: 08/08/14  8:03 PM  Result Value Ref Range Status   Specimen Description URINE, CLEAN CATCH  Final   Special Requests NONE  Final   Culture   Final    >=100,000 COLONIES/mL ESCHERICHIA COLI ESBL-EXTENDED SPECTRUM BETA LACTAMASE-THE ORGANISM IS RESISTANT TO PENICILLINS, CEPHALOSPORINS AND AZTREONAM ACCORDING TO CLSI M100-S15 VOL.25 N01 JAN 2005. CRITICAL RESULT CALLED TO, READ BACK BY AND VERIFIED WITH: GREG MOYER ON 08/11/14 AT 0830 BY JEF    Report Status 08/11/2014 FINAL  Final   Organism ID, Bacteria ESCHERICHIA COLI  Final      Susceptibility   Escherichia coli - MIC*    AMPICILLIN >=32 RESISTANT Resistant     CEFAZOLIN >=64 RESISTANT Resistant     CEFTRIAXONE >=64 RESISTANT Resistant     CIPROFLOXACIN >=4 RESISTANT Resistant     GENTAMICIN <=1 SENSITIVE Sensitive     IMIPENEM <=0.25 SENSITIVE Sensitive     NITROFURANTOIN 128 RESISTANT Resistant     TRIMETH/SULFA >=320 RESISTANT Resistant     CEFOXITIN <=4 SENSITIVE Sensitive     Extended ESBL POSITIVE Resistant     * >=100,000 COLONIES/mL ESCHERICHIA COLI  Urine culture     Status: None   Collection Time: 08/11/14  4:17 PM  Result Value Ref Range Status   Specimen Description URINE, RANDOM  Final   Special Requests NONE  Final   Culture   Final    >=100,000 COLONIES/mL ESCHERICHIA COLI ESBL-EXTENDED SPECTRUM BETA LACTAMASE-THE ORGANISM IS RESISTANT TO PENICILLINS, CEPHALOSPORINS AND AZTREONAM ACCORDING TO CLSI M100-S15 VOL.25 N01 JAN 2005. CRITICAL RESULT CALLED TO, READ BACK BY AND VERIFIED WITH: TRACEY WATSON ON 08/13/14 AT 0805 BY JEF    Report Status 08/13/2014 FINAL  Final   Organism ID, Bacteria ESCHERICHIA COLI  Final      Susceptibility   Escherichia coli - MIC*    AMPICILLIN >=32 RESISTANT Resistant     CEFTAZIDIME 16 RESISTANT Resistant     CEFAZOLIN >=64 RESISTANT Resistant     CEFTRIAXONE >=64 RESISTANT Resistant     CIPROFLOXACIN  >=4 RESISTANT Resistant  GENTAMICIN <=1 SENSITIVE Sensitive     IMIPENEM <=0.25 SENSITIVE Sensitive     TRIMETH/SULFA >=320 RESISTANT Resistant     CEFOXITIN <=4 SENSITIVE Sensitive     Extended ESBL POSITIVE Resistant     * >=100,000 COLONIES/mL ESCHERICHIA COLI    RADIOLOGY:  Dg Chest Port 1 View  08/13/2014   CLINICAL DATA:  Acute onset of shortness of breath. Initial encounter.  EXAM: PORTABLE CHEST - 1 VIEW  COMPARISON:  Chest radiograph performed 06/16/2014  FINDINGS: The lungs are well-aerated. Mild vascular congestion is noted. Mild bibasilar opacities likely reflect atelectasis, though minimal interstitial edema might have a similar appearance. There is no evidence of pleural effusion or pneumothorax.  The cardiomediastinal silhouette is borderline enlarged. The patient is status post median sternotomy, with evidence of prior CABG. No acute osseous abnormalities are seen.  IMPRESSION: Mild vascular congestion and borderline cardiomegaly. Mild bibasilar airspace opacities likely reflect atelectasis, though minimal interstitial edema might have a similar appearance.   Electronically Signed   By: Roanna Raider M.D.   On: 08/13/2014 05:33     Management plans discussed with the patient, family and they are in agreement.  CODE STATUS:     Code Status Orders        Start     Ordered   08/11/14 2012  Full code   Continuous     08/11/14 2011    Advance Directive Documentation        Most Recent Value   Type of Advance Directive  Living will, Healthcare Power of Attorney   Pre-existing out of facility DNR order (yellow form or pink MOST form)     "MOST" Form in Place?        TOTAL TIME TAKING CARE OF THIS PATIENT: 40 minutes.    Johari Pinney M.D on 08/13/2014 at 10:44 AM  Between 7am to 6pm - Pager - [phone] After 6pm go to [email or web address] - password [email or web address] [facility]  [place] [facility]  Office  [phone]  CC: Primary care physician; Fidel Levy,  MD

## 2014-08-13 NOTE — Progress Notes (Signed)
Pt c/o shortness of breath. Vital signs currently stable. O2 sats 100% on RA. Received 250 ml bolus of NS yesterday due to hypotension. Pt has a history of CHF. Dr Sheryle Hail gave telephone order for portable chest xray.Geri [place]in[place], RN

## 2014-08-13 NOTE — Progress Notes (Signed)
Pt and family given prescription x 1, given d/c instructions r/t activity and followup care, voiced understanding, pt d/c home via wheelchair and escorted by staff and family

## 2014-08-15 ENCOUNTER — Other Ambulatory Visit: Payer: Self-pay | Admitting: Family Medicine

## 2014-08-15 ENCOUNTER — Telehealth: Payer: Self-pay | Admitting: Family Medicine

## 2014-08-15 DIAGNOSIS — R110 Nausea: Secondary | ICD-10-CM

## 2014-08-15 MED ORDER — ONDANSETRON HCL 4 MG PO TABS: 4.0000 mg | ORAL_TABLET | Freq: Three times a day (TID) | ORAL | Status: DC | PRN

## 2014-08-15 NOTE — Telephone Encounter (Signed)
Please let her know I sent Zofran three times daily as needed. This can make her drowsy, so please only take when needed.

## 2014-08-15 NOTE — Telephone Encounter (Signed)
Please review with Janice CoffinNisha

## 2014-08-15 NOTE — Telephone Encounter (Signed)
Called pt and advised as per [name] and she will pick up Rx. [name]

## 2014-08-15 NOTE — Telephone Encounter (Signed)
Pt came home from hospital on injections which is causing nausea with vomiting. Pt is hoping something can be called into the pharmacy.   [facility]

## 2014-08-16 NOTE — Telephone Encounter (Signed)
Pt is on Amiodarone for her Afib. Possible interaction with ondansetron for QT prolongation. [facility]/2014. Per pharmacist the interaction is not significant and it should be safe for this patient to take the medication in the short-term.

## 2014-08-17 ENCOUNTER — Other Ambulatory Visit: Payer: Medicare Other

## 2014-08-22 ENCOUNTER — Encounter
Admission: RE | Admit: 2014-08-22 | Discharge: 2014-08-22 | Disposition: A | Discharge status: Home / Self Care | Payer: Medicare Other | Source: Ambulatory Visit | Attending: Vascular Surgery | Admitting: Vascular Surgery

## 2014-08-22 ENCOUNTER — Telehealth: Payer: Self-pay | Admitting: Family Medicine

## 2014-08-22 DIAGNOSIS — Z01810 Encounter for preprocedural cardiovascular examination: Secondary | ICD-10-CM | POA: Diagnosis present

## 2014-08-22 DIAGNOSIS — Z01812 Encounter for preprocedural laboratory examination: Secondary | ICD-10-CM | POA: Diagnosis not present

## 2014-08-22 DIAGNOSIS — I509 Heart failure, unspecified: Secondary | ICD-10-CM

## 2014-08-22 HISTORY — DX: Unspecified atherosclerosis of native arteries of extremities, left leg: I70.202

## 2014-08-22 LAB — BASIC METABOLIC PANEL
Anion gap: 11 (ref 5–15)
BUN: 43 mg/dL — AB (ref 6–20)
CO2: 29 mmol/L (ref 22–32)
Calcium: 8.9 mg/dL (ref 8.9–10.3)
Chloride: 102 mmol/L (ref 101–111)
Creatinine, Ser: 1.94 mg/dL — ABNORMAL HIGH (ref 0.44–1.00)
GFR calc Af Amer: 27 mL/min — ABNORMAL LOW (ref >60)
GFR, EST NON AFRICAN AMERICAN: 23 mL/min — AB (ref >60)
GLUCOSE: 266 mg/dL — AB (ref 65–99)
POTASSIUM: 4.1 mmol/L (ref 3.5–5.1)
Sodium: 142 mmol/L (ref 135–145)

## 2014-08-22 LAB — CBC
HEMATOCRIT: 30.6 % — AB (ref 35.0–47.0)
HEMOGLOBIN: 9.7 g/dL — AB (ref 12.0–16.0)
MCH: 26.5 pg (ref 26.0–34.0)
MCHC: 31.7 g/dL — ABNORMAL LOW (ref 32.0–36.0)
MCV: 83.7 fL (ref 80.0–100.0)
Platelets: 264 10*3/uL (ref 150–440)
RBC: 3.65 MIL/uL — ABNORMAL LOW (ref 3.80–5.20)
RDW: 17.4 % — AB (ref 11.5–14.5)
WBC: 5.7 10*3/uL (ref 3.6–11.0)

## 2014-08-22 LAB — PROTIME-INR
INR: 1.3
Prothrombin Time: 16.4 seconds — ABNORMAL HIGH (ref 11.4–15.0)

## 2014-08-22 LAB — APTT: aPTT: 31 seconds (ref 24–36)

## 2014-08-22 NOTE — Telephone Encounter (Signed)
That is fine. Please let HH know that we have made her an appt at urology for July 15 at 2pm. It is vitally important given recent UTI that she follow with them regarding her condition. I have discussed her mobility issues with the [facility] manager and they think they will be able to see her. I have asked Rachell to call her to see if she can come in for a HFU this week and to relay the appt for [facility].  If HH can help us convince her to go to the appt, we would greatly appreciate it!  AK

## 2014-08-22 NOTE — Telephone Encounter (Signed)
[facility] twice a week. [name]

## 2014-08-22 NOTE — Patient Instructions (Addendum)
  Your procedure is scheduled on: Wednesday August 30, 2014 Report to Same Day Surgery. To find out your arrival time please call [phone] between 1PM - 3PM on Tuesday August 29, 2014.  Remember: Instructions that are not followed completely may result in serious medical risk, up to and including death, or upon the discretion of your surgeon and anesthesiologist your surgery may need to be rescheduled.    __x_ 1. Do not eat food or drink liquids after midnight. No gum chewing or hard candies.     ____ 2. No Alcohol for 24 hours before or after surgery.   ____ 3. Bring all medications with you on the day of surgery if instructed.    __x__ 4. Notify your doctor if there is any change in your medical condition     (cold, fever, infections).     Do not wear jewelry, make-up, hairpins, clips or nail polish.  Do not wear lotions, powders, or perfumes. You may wear deodorant.  Do not shave 48 hours prior to surgery. Men may shave face and neck.  Do not bring valuables to the hospital.    [facility] not responsible for any belongings or valuables.               Contacts, dentures or bridgework may not be worn into surgery.  Leave your suitcase in the car. After surgery it may be brought to your room.  For patients admitted to the hospital, discharge time is determined by you treatment team.   Patients discharged the day of surgery will not be allowed to drive home.    Please read over the following fact sheets that you were given:   [facility] Pre[facility]  __x__ Take these medicines the morning of surgery with A SIP OF WATER:    1. amiodarone (PACERONE)  2. benazepril (LOTENSIN  3. carvedilol (COREG)   4. gabapentin (NEURONTIN  5. isosorbide mononitrate (IMDUR)  6. levothyroxine (SYNTHROID, LEVOTHROID)  7.pravastatin (PRAVACHOL) if taken in am.  ____ Fleet Enema (as directed)   _x___ Use CHG Soap as directed  ____ Use inhalers on the day of surgery  ____ Stop  metformin 2 days prior to surgery    __x__ Take 1/2 of usual insulin dose the night before surgery and none on the morning of surgery.   __x__ Stop Eliquis on Sunday July 10th, per Dr. Gilda CreaseSchnier.  ____ Stop Anti-inflammatories on does not apply.   ____ Stop supplements until after surgery.    ____ Bring C-Pap to the hospital.

## 2014-08-22 NOTE — Telephone Encounter (Signed)
Spoke with pt and Genevieve NorlanderGentiva she is having vascular surgery done on 07/13 so her appt for 07/15 rescheduled for 09/22/2014 at 1:30. Called back to let them know appt time pt's phone were ringing and Left detailed message with Genevieve NorlanderGentiva and also replay same messege to pt if they see her.

## 2014-08-28 ENCOUNTER — Ambulatory Visit: Payer: Medicare Other | Admitting: Cardiovascular Disease

## 2014-08-29 ENCOUNTER — Telehealth: Payer: Self-pay | Admitting: Cardiovascular Disease

## 2014-08-29 ENCOUNTER — Telehealth: Payer: Self-pay | Admitting: Family Medicine

## 2014-08-29 LAB — ABO/RH: ABO/RH(D): O POS

## 2014-08-29 NOTE — Telephone Encounter (Signed)
S/w Cindy at [facility] regarding Alycia RossettiRyan Dunn's recommendations. Cindy verbalized understanding.

## 2014-08-29 NOTE — Telephone Encounter (Signed)
Pt has appt tomorrow to see if she has cleared her UTI. Pt has been unable to void in the office for urine samples due to her inability to ambulate. Called pt to ensure she would have help getting to the toilet to void for urine sample to culture for surgical clearance. Pt stated she thought she would be able to void.   Pt reported she did not know she needed a urine sample for surgical clearance. Discussed with patient her conversation with Nisha on 7/5 and reinforced need to follow-up with urology. Pt cancelled an appt made for her on 7/15 but we have rescheduled this appt for 09/22/14 at 130pm. Pt reminded of details of appt.

## 2014-08-29 NOTE — Telephone Encounter (Signed)
S/w patient regarding [name]'s recommendations.  Pt states she was unaware she needed to have urine re-checked and will call PCP to check on infection in urine. States she did not come to appointment with Dr. Kirke CorinArida yesterday because she "thought it was a mistake". Reviewed instructions from June appt. States she is on a fixed income and every time she comes to the doctor it cost her $50. Pt verbalized understanding of recommendations with no further questions.

## 2014-08-29 NOTE — Pre-Procedure Instructions (Signed)
Pt was scheduled to Dr. Kirke CorinArida yesterday, pt canceled appt, reporting, she couldn't make it.  Pt was admitted on 08/11/14 for UTI.  Cardiology will not clear here for surgery at this time, too many red flags.  Pt to follow up with medical Dr. For UTI. Cardiology office will call pt and Dr. Marijean HeathSchnier's office.

## 2014-08-29 NOTE — Telephone Encounter (Signed)
Forward to Ryan Dunn, PA 

## 2014-08-29 NOTE — Telephone Encounter (Signed)
Unfortunately, there are too many red flags here to have her keep the surgery scheduled. Patient was recently admitted with ESBL bacteruria with last urine culture 6/24. No culture showing clearing of the infection. She has not been feeling well, uncertain of the specifics. Please have her follow up with her PCP regarding this.

## 2014-08-29 NOTE — Telephone Encounter (Signed)
S/w Leta JunglingMarcia, pre-admit nurse, regarding Alycia RossettiRyan Dunn's recommendations who verbalized understanding. Will notify pt and Dr. Gilda CreaseSchnier.

## 2014-08-29 NOTE — Telephone Encounter (Signed)
Patient is scheduled for B/L femoral endarterectomy on 08/30/14 and Leta JunglingMarcia from pre adm wants to know if cardiac clearance from May is still good and she is ok for surgery.  Patient has had ed and inpatient visit since then and cancelled yesterdays visit with Kirke Corinrida because she "couldn't make it" also per Leta JunglingMarcia she has not been feeling well.  Does she need to be seen again to obtain new clearance form.  Please call Leta JunglingMarcia at  [phone].

## 2014-08-30 ENCOUNTER — Encounter: Admission: RE | Payer: Self-pay | Source: Ambulatory Visit

## 2014-08-30 ENCOUNTER — Ambulatory Visit (INDEPENDENT_AMBULATORY_CARE_PROVIDER_SITE_OTHER): Payer: Self-pay | Admitting: Family Medicine

## 2014-08-30 ENCOUNTER — Inpatient Hospital Stay: Admission: RE | Admit: 2014-08-30 | Payer: Medicare Other | Source: Ambulatory Visit | Admitting: Vascular Surgery

## 2014-08-30 ENCOUNTER — Ambulatory Visit: Payer: Self-pay | Admitting: Family Medicine

## 2014-08-30 ENCOUNTER — Encounter: Payer: Self-pay | Admitting: Family Medicine

## 2014-08-30 VITALS — BP 124/66 | HR 66 | Temp 97.9°F | Resp 16 | Ht 61.0 in

## 2014-08-30 DIAGNOSIS — N390 Urinary tract infection, site not specified: Secondary | ICD-10-CM

## 2014-08-30 DIAGNOSIS — B372 Candidiasis of skin and nail: Secondary | ICD-10-CM

## 2014-08-30 DIAGNOSIS — E119 Type 2 diabetes mellitus without complications: Secondary | ICD-10-CM

## 2014-08-30 LAB — POCT URINALYSIS DIPSTICK
BILIRUBIN UA: NEGATIVE
Glucose, UA: NEGATIVE
KETONES UA: NEGATIVE
Nitrite, UA: NEGATIVE
PH UA: 5
PROTEIN UA: NEGATIVE
Spec Grav, UA: 1.015
Urobilinogen, UA: NEGATIVE

## 2014-08-30 LAB — POCT GLYCOSYLATED HEMOGLOBIN (HGB A1C): Hemoglobin A1C: 7.1

## 2014-08-30 SURGERY — ENDARTERECTOMY, FEMORAL
Anesthesia: Choice | Laterality: Left

## 2014-08-30 MED ORDER — NYSTATIN 100000 UNIT/GM EX CREA: 1.0000 "application " | TOPICAL_CREAM | Freq: Two times a day (BID) | CUTANEOUS | Status: DC

## 2014-08-30 MED ORDER — NYSTATIN EX POWD: 1.0000 "application " | Freq: Every day | CUTANEOUS | Status: DC

## 2014-08-30 NOTE — Patient Instructions (Addendum)
UTI f/u: We will culture your urine. If you develop fever, flank pain, drowsiness, or other concerning symptoms please seek immediate medical attention.  Please keep your appt with Urology on September 22, 2014.   Yeast rash: Apply nystatin twice daily to groin folds. Keep area clean and dry. Change your depends at least every 2 hours if wet. Please talk to home health RN about ordering your nystatin powder.   DM: Your A1c is doing well continue your current medications.

## 2014-08-30 NOTE — Progress Notes (Signed)
Subjective:    Patient ID: [name], female    DOB: 04-Jan-1932, 81 y.o.   MRN: [id number]  HPI: [name] is a 81 y.o. female presenting on 08/30/2014 for Follow-up   HPI Pt presents for hospital follow-up for UTI. She was hospitalized on June 24 for EBSL. She was scheduled for surgery on July 13 but was cancelled due to need for clear UC.  She is reporting no urinary symptoms at this time.  Finished home ABX on Sunday, July 3.   Pt is also reporting red irritated rash on the underside of the pannus and groin folds. Rash has been present since leaving the hospital. Rash is described as pruritic.  Pt describes odor from the rash- fermented odor. No oozing.   Pt missed recent diabetes follow-up. Her last A1c was 7.4 in the hospital. She reports her sugars are avg 130 at home. She remains on insulin for her diabetes. Takes 10units humalog with meals and 10 units of lantus daily. Denies polydipsia, polyuria, or polyphagia. She has known neuropathy of her feet. No incidence of hypoglycemia.    Past Medical History  Diagnosis Date  . DM2 (diabetes mellitus, type 2)     onset age 22 insulin dependent  . Hyperlipidemia   . COPD (chronic obstructive pulmonary disease)   . Peripheral vascular disease   . CAD (coronary artery disease)     a. MI 1988 s/p CABG in 1988, multiple PCI on SVGs; b. cath 2012: occluded native coronary arteries, patent LIMA to LAD (distal LAD dz), patent SVG-OM & occluded SVG-RCA which filled via left to right collats; c. cath 12/2013 patent LIMA-LAD & SVG-O. known occluded VG-RCA w/ L-R collats, no change since cath 2012  . Chronic systolic CHF (congestive heart failure)     a. 03/2012: EF 40-45%, mild lateral wall HK, mod inf HK, mod post wall HK, mild LVH, mild MR/TR   . Paroxysmal atrial fibrillation     a. CHADSVASc 7: yearly risk of CVA 9.6%;. b. on Eliquis 2.5 mg bid (age, SCr, borderline wt 62.9 Kg)   . HTN (hypertension)   . Yeast infection   .  UTI (urinary tract infection)   . Atherosclerosis of native artery of left lower extremity   . Myocardial infarction     Current Outpatient Prescriptions on File Prior to Visit  Medication Sig  . amiodarone (PACERONE) 200 MG tablet Take 1 tablet (200 mg total) by mouth daily.  [name] apixaban (ELIQUIS) 2.5 MG TABS tablet Take 1 tablet (2.5 mg total) by mouth 2 (two) times daily.  . benazepril (LOTENSIN) 20 MG tablet TAKE 1 TABLET (20 MG TOTAL) BY MOUTH DAILY.  . carvedilol (COREG) 3.125 MG tablet Take 1 tablet (3.125 mg total) by mouth 2 (two) times daily.  . ertapenem [facility]) 1 G injection Inject 0.5 g into the muscle daily. For 7 days  . gabapentin (NEURONTIN) 400 MG capsule Take 400 mg by mouth 3 (three) times daily.  [name] HYDROcodone-acetaminophen (NORCO/VICODIN) 5-325 MG per tablet Take 2 tablets by mouth every 6 (six) hours as needed for moderate pain.   [name] insulin glargine (LANTUS) 100 UNIT/ML injection Inject 0.1 mLs (10 Units total) into the skin at bedtime.  . insulin lispro (HUMALOG) 100 UNIT/ML injection Inject 0.04 mLs (4 Units total) into the skin 3 (three) times daily before meals. (Patient taking differently: Inject 10 Units into the skin 3 (three) times daily with meals. )  . isosorbide mononitrate (IMDUR) 60 MG  24 hr tablet Take 1.5 mg by mouth daily.  [name]. levothyroxine (SYNTHROID, LEVOTHROID) 100 MCG tablet Take 100 mcg by mouth daily before breakfast.  . nitrofurantoin (MACRODANTIN) 100 MG capsule Take 100 mg by mouth 4 (four) times daily.  . nitroGLYCERIN (NITROSTAT) 0.4 MG SL tablet Place 1 tablet (0.4 mg total) under the tongue every 5 (five) minutes as needed for chest pain.  [name]. ondansetron (ZOFRAN) 4 MG tablet Take 1 tablet (4 mg total) by mouth every 8 (eight) hours as needed for nausea or vomiting.  . potassium chloride (K-DUR) 10 MEQ tablet Take 10 mEq by mouth daily.   . pravastatin (PRAVACHOL) 40 MG tablet Take 40 mg by mouth daily.  [name]. torsemide (DEMADEX) 20 MG tablet Take 1  tablet (20 mg total) by mouth 2 (two) times daily.   No current facility-administered medications on file prior to visit.    Review of Systems  Constitutional: Negative for fever and chills.  HENT: Negative.   Respiratory: Negative for chest tightness, shortness of breath and wheezing.   Cardiovascular: Negative for chest pain, palpitations and leg swelling.  Gastrointestinal: Negative for nausea, vomiting, abdominal pain and abdominal distention.  Endocrine: Negative for cold intolerance, heat intolerance, polydipsia, polyphagia and polyuria.  Genitourinary: Negative.  Negative for dysuria, urgency, hematuria, flank pain, difficulty urinating and pelvic pain.  Musculoskeletal: Negative.   Neurological: Negative for dizziness, syncope, light-headedness, numbness and headaches.  Psychiatric/Behavioral: Negative.    Per HPI unless specifically indicated above     Objective:    BP 124/66 mmHg  Pulse 66  Temp(Src) 97.9 F (36.6 C) (Oral)  Resp 16  Ht 5\' 1"  (1.549 m)  Wt   Wt Readings from Last 3 Encounters:  08/22/14 142 lb (64.411 kg)  08/13/14 152 lb 3.2 oz (69.037 kg)  08/08/14 140 lb (63.504 kg)    Physical Exam  Constitutional: She is oriented to person, place, and time. She appears well-developed and well-nourished. No distress.  HENT:  Head: Normocephalic and atraumatic.  Neck: Normal range of motion. Neck supple. No thyromegaly present.  Cardiovascular: Normal rate and regular rhythm.  Exam reveals no gallop and no friction rub.   Murmur heard. Pulmonary/Chest: Effort normal. She has rales (fine crackles at the bases of the lungs. ).  Abdominal: Soft. Bowel sounds are normal. There is no tenderness. There is no rebound and no CVA tenderness.  Musculoskeletal: Normal range of motion. She exhibits no edema or tenderness.  Lymphadenopathy:    She has no cervical adenopathy.  Neurological: She is alert and oriented to person, place, and time.  Skin: Skin is warm and  dry. Rash noted. She is not diaphoretic. There is erythema.  Erythematous rash in bilateral groin folds consistent with yeast.    Diabetic Foot Exam - Simple   Simple Foot Form  Diabetic Foot exam was performed with the following findings:  Yes 08/30/2014  2:23 PM  Visual Inspection  See comments:  Yes  Sensation Testing  See comments:  Yes  Pulse Check  See comments:  Yes  Comments  Diminished pedal pulses bilaterally due to PVD. Diminished sensation to monofilament bilateral feet. Stage 2 ulcer on the medial ankle.      Results for orders placed or performed in visit on 08/30/14  POCT Urinalysis Dipstick  Result Value Ref Range   Color, UA dark yellow    Clarity, UA clear    Glucose, UA negative    Bilirubin, UA negative    Ketones, UA negative  Spec Grav, UA 1.015    Blood, UA large    pH, UA 5.0    Protein, UA negative    Urobilinogen, UA negative    Nitrite, UA negative    Leukocytes, UA moderate (2+) (A) Negative  POCT HgB A1C  Result Value Ref Range   Hemoglobin A1C 7.1       Assessment & Plan:   Problem List Items Addressed This Visit      Genitourinary   UTI (lower urinary tract infection) - Primary    UA shows large leukocytes and blood. Will culture to ensure EBSL is resolved. Pt is aware of alarm symptoms regarding UTI. Discussed needs to change depends at least q2 hours if any moisture to prevent further UTI and skin breakdown.   Pt has appt scheduled with urology on August 5. Stressed the importance of not missing this visit.       Relevant Medications   nystatin cream (MYCOSTATIN)   NYSTATIN, TOPICAL, POWD   Other Relevant Orders   POCT Urinalysis Dipstick (Completed)   Urine Culture    Other Visit Diagnoses    Diabetes type 2, controlled        A1c is down from April. Continue current medications. Pt has declined eye exam today due to current health issues. RTC 3 mos.     Relevant Orders    POCT HgB A1C (Completed)    Candidal skin infection         Nystatin BID. Keep area clean and dry. Change incontinence briefs q2hours at minimum.      Relevant Medications    nystatin cream (MYCOSTATIN)    NYSTATIN, TOPICAL, POWD       Meds ordered this encounter  Medications  . nystatin cream (MYCOSTATIN)    Sig: Apply 1 application topically 2 (two) times daily.    Dispense:  30 g    Refill:  1    Order Specific Question:  Supervising Provider    Answer:  Janeann Forehand [[id number]]  . NYSTATIN, TOPICAL, POWD    Sig: Apply 1 application topically daily.    Refill:  0    Order Specific Question:  Supervising Provider    Answer:  Janeann Forehand [[id number]]      Follow up plan: Return in about 3 months (around 11/30/2014) for DM.

## 2014-08-30 NOTE — Assessment & Plan Note (Signed)
UA shows large leukocytes and blood. Will culture to ensure EBSL is resolved. Pt is aware of alarm symptoms regarding UTI. Discussed needs to change depends at least q2 hours if any moisture to prevent further UTI and skin breakdown.   Pt has appt scheduled with urology on August 5. Stressed the importance of not missing this visit.

## 2014-08-31 ENCOUNTER — Encounter: Payer: Self-pay | Admitting: *Deleted

## 2014-08-31 ENCOUNTER — Other Ambulatory Visit: Payer: Self-pay

## 2014-08-31 ENCOUNTER — Emergency Department: Payer: Medicare Other

## 2014-08-31 ENCOUNTER — Inpatient Hospital Stay
Admission: EM | Admit: 2014-08-31 | Discharge: 2014-09-05 | DRG: 291 | Disposition: A | Discharge status: Home Health | Payer: Medicare Other | Attending: Specialist | Admitting: Specialist

## 2014-08-31 DIAGNOSIS — I248 Other forms of acute ischemic heart disease: Secondary | ICD-10-CM | POA: Diagnosis present

## 2014-08-31 DIAGNOSIS — Z79899 Other long term (current) drug therapy: Secondary | ICD-10-CM

## 2014-08-31 DIAGNOSIS — Z794 Long term (current) use of insulin: Secondary | ICD-10-CM

## 2014-08-31 DIAGNOSIS — R0902 Hypoxemia: Secondary | ICD-10-CM

## 2014-08-31 DIAGNOSIS — Z8249 Family history of ischemic heart disease and other diseases of the circulatory system: Secondary | ICD-10-CM | POA: Diagnosis not present

## 2014-08-31 DIAGNOSIS — E1140 Type 2 diabetes mellitus with diabetic neuropathy, unspecified: Secondary | ICD-10-CM | POA: Diagnosis present

## 2014-08-31 DIAGNOSIS — Z87891 Personal history of nicotine dependence: Secondary | ICD-10-CM

## 2014-08-31 DIAGNOSIS — I5023 Acute on chronic systolic (congestive) heart failure: Principal | ICD-10-CM | POA: Diagnosis present

## 2014-08-31 DIAGNOSIS — N390 Urinary tract infection, site not specified: Secondary | ICD-10-CM | POA: Diagnosis present

## 2014-08-31 DIAGNOSIS — I2510 Atherosclerotic heart disease of native coronary artery without angina pectoris: Secondary | ICD-10-CM | POA: Diagnosis present

## 2014-08-31 DIAGNOSIS — M1610 Unilateral primary osteoarthritis, unspecified hip: Secondary | ICD-10-CM | POA: Diagnosis present

## 2014-08-31 DIAGNOSIS — Z951 Presence of aortocoronary bypass graft: Secondary | ICD-10-CM | POA: Diagnosis not present

## 2014-08-31 DIAGNOSIS — I482 Chronic atrial fibrillation: Secondary | ICD-10-CM | POA: Diagnosis present

## 2014-08-31 DIAGNOSIS — Z95828 Presence of other vascular implants and grafts: Secondary | ICD-10-CM

## 2014-08-31 DIAGNOSIS — E039 Hypothyroidism, unspecified: Secondary | ICD-10-CM | POA: Diagnosis present

## 2014-08-31 DIAGNOSIS — Y95 Nosocomial condition: Secondary | ICD-10-CM | POA: Diagnosis present

## 2014-08-31 DIAGNOSIS — E785 Hyperlipidemia, unspecified: Secondary | ICD-10-CM | POA: Diagnosis present

## 2014-08-31 DIAGNOSIS — Z7901 Long term (current) use of anticoagulants: Secondary | ICD-10-CM | POA: Diagnosis not present

## 2014-08-31 DIAGNOSIS — R7989 Other specified abnormal findings of blood chemistry: Secondary | ICD-10-CM | POA: Diagnosis present

## 2014-08-31 DIAGNOSIS — I481 Persistent atrial fibrillation: Secondary | ICD-10-CM | POA: Diagnosis not present

## 2014-08-31 DIAGNOSIS — I5032 Chronic diastolic (congestive) heart failure: Secondary | ICD-10-CM | POA: Diagnosis not present

## 2014-08-31 DIAGNOSIS — D649 Anemia, unspecified: Secondary | ICD-10-CM | POA: Diagnosis present

## 2014-08-31 DIAGNOSIS — I252 Old myocardial infarction: Secondary | ICD-10-CM

## 2014-08-31 DIAGNOSIS — J449 Chronic obstructive pulmonary disease, unspecified: Secondary | ICD-10-CM | POA: Diagnosis present

## 2014-08-31 DIAGNOSIS — J189 Pneumonia, unspecified organism: Secondary | ICD-10-CM | POA: Diagnosis present

## 2014-08-31 DIAGNOSIS — E1159 Type 2 diabetes mellitus with other circulatory complications: Secondary | ICD-10-CM

## 2014-08-31 DIAGNOSIS — Z880 Allergy status to penicillin: Secondary | ICD-10-CM | POA: Diagnosis not present

## 2014-08-31 DIAGNOSIS — I10 Essential (primary) hypertension: Secondary | ICD-10-CM | POA: Diagnosis present

## 2014-08-31 DIAGNOSIS — I739 Peripheral vascular disease, unspecified: Secondary | ICD-10-CM | POA: Diagnosis present

## 2014-08-31 DIAGNOSIS — B9620 Unspecified Escherichia coli [E. coli] as the cause of diseases classified elsewhere: Secondary | ICD-10-CM | POA: Diagnosis present

## 2014-08-31 DIAGNOSIS — I480 Paroxysmal atrial fibrillation: Secondary | ICD-10-CM | POA: Diagnosis present

## 2014-08-31 DIAGNOSIS — Z955 Presence of coronary angioplasty implant and graft: Secondary | ICD-10-CM

## 2014-08-31 DIAGNOSIS — I509 Heart failure, unspecified: Secondary | ICD-10-CM

## 2014-08-31 DIAGNOSIS — Z885 Allergy status to narcotic agent status: Secondary | ICD-10-CM

## 2014-08-31 DIAGNOSIS — Z91041 Radiographic dye allergy status: Secondary | ICD-10-CM

## 2014-08-31 DIAGNOSIS — Z888 Allergy status to other drugs, medicaments and biological substances status: Secondary | ICD-10-CM | POA: Diagnosis not present

## 2014-08-31 LAB — BASIC METABOLIC PANEL
Anion gap: 17 — ABNORMAL HIGH (ref 5–15)
BUN: 32 mg/dL — ABNORMAL HIGH (ref 6–20)
CHLORIDE: 101 mmol/L (ref 101–111)
CO2: 23 mmol/L (ref 22–32)
CREATININE: 1.82 mg/dL — AB (ref 0.44–1.00)
Calcium: 9 mg/dL (ref 8.9–10.3)
GFR calc Af Amer: 29 mL/min — ABNORMAL LOW (ref >60)
GFR calc non Af Amer: 25 mL/min — ABNORMAL LOW (ref >60)
Glucose, Bld: 244 mg/dL — ABNORMAL HIGH (ref 65–99)
POTASSIUM: 4.8 mmol/L (ref 3.5–5.1)
Sodium: 141 mmol/L (ref 135–145)

## 2014-08-31 LAB — CBC
HEMATOCRIT: 32 % — AB (ref 35.0–47.0)
HEMOGLOBIN: 9.8 g/dL — AB (ref 12.0–16.0)
MCH: 25.3 pg — ABNORMAL LOW (ref 26.0–34.0)
MCHC: 30.6 g/dL — AB (ref 32.0–36.0)
MCV: 82.7 fL (ref 80.0–100.0)
Platelets: 308 10*3/uL (ref 150–440)
RBC: 3.88 MIL/uL (ref 3.80–5.20)
RDW: 17.6 % — ABNORMAL HIGH (ref 11.5–14.5)
WBC: 7.1 10*3/uL (ref 3.6–11.0)

## 2014-08-31 LAB — BRAIN NATRIURETIC PEPTIDE: B Natriuretic Peptide: >4500 pg/mL — ABNORMAL HIGH (ref 0.0–100.0)

## 2014-08-31 LAB — TROPONIN I: Troponin I: 0.58 ng/mL — ABNORMAL HIGH (ref <0.031)

## 2014-08-31 MED ORDER — LEVOTHYROXINE SODIUM 100 MCG PO TABS
100.0000 ug | ORAL_TABLET | Freq: Every day | ORAL | Status: DC
  Administered 2014-09-01 – 2014-09-05 (×5): 100 ug via ORAL
  Filled 2014-08-31 (×5): qty 1

## 2014-08-31 MED ORDER — DEXTROSE 5 % IV SOLN
2.0000 g | Freq: Once | INTRAVENOUS | Status: AC
  Administered 2014-09-01: 2 g via INTRAVENOUS
  Filled 2014-08-31: qty 2

## 2014-08-31 MED ORDER — LEVOFLOXACIN IN D5W 750 MG/150ML IV SOLN
750.0000 mg | Freq: Once | INTRAVENOUS | Status: DC
  Filled 2014-08-31: qty 150

## 2014-08-31 MED ORDER — HYDROCODONE-ACETAMINOPHEN 5-325 MG PO TABS
2.0000 | ORAL_TABLET | Freq: Four times a day (QID) | ORAL | Status: DC | PRN
  Administered 2014-09-01 – 2014-09-04 (×3): 2 via ORAL
  Filled 2014-08-31 (×3): qty 2

## 2014-08-31 MED ORDER — NYSTATIN 100000 UNIT/GM EX CREA
1.0000 "application " | TOPICAL_CREAM | Freq: Two times a day (BID) | CUTANEOUS | Status: DC
  Administered 2014-09-01 – 2014-09-05 (×9): 1 via TOPICAL
  Filled 2014-08-31 (×2): qty 15

## 2014-08-31 MED ORDER — APIXABAN 2.5 MG PO TABS
2.5000 mg | ORAL_TABLET | Freq: Two times a day (BID) | ORAL | Status: DC
  Administered 2014-09-01 – 2014-09-05 (×10): 2.5 mg via ORAL
  Filled 2014-08-31 (×10): qty 1

## 2014-08-31 MED ORDER — BENAZEPRIL HCL 20 MG PO TABS
20.0000 mg | ORAL_TABLET | Freq: Every day | ORAL | Status: DC
  Filled 2014-08-31: qty 1

## 2014-08-31 MED ORDER — INSULIN GLARGINE 100 UNIT/ML ~~LOC~~ SOLN
30.0000 [IU] | Freq: Every day | SUBCUTANEOUS | Status: DC
  Administered 2014-09-01 – 2014-09-04 (×4): 30 [IU] via SUBCUTANEOUS
  Filled 2014-08-31 (×6): qty 0.3

## 2014-08-31 MED ORDER — ONDANSETRON HCL 4 MG PO TABS: 4.0000 mg | ORAL_TABLET | Freq: Four times a day (QID) | ORAL | Status: DC | PRN

## 2014-08-31 MED ORDER — IPRATROPIUM-ALBUTEROL 0.5-2.5 (3) MG/3ML IN SOLN: 3.0000 mL | RESPIRATORY_TRACT | Status: DC | PRN

## 2014-08-31 MED ORDER — NITROGLYCERIN 0.4 MG SL SUBL: 0.4000 mg | SUBLINGUAL_TABLET | SUBLINGUAL | Status: DC | PRN

## 2014-08-31 MED ORDER — ISOSORBIDE MONONITRATE ER 60 MG PO TB24: 1.5000 mg | ORAL_TABLET | Freq: Every day | ORAL | Status: DC

## 2014-08-31 MED ORDER — SODIUM CHLORIDE 0.9 % IJ SOLN
3.0000 mL | Freq: Two times a day (BID) | INTRAMUSCULAR | Status: DC
  Administered 2014-08-31: 3 mL via INTRAVENOUS

## 2014-08-31 MED ORDER — MORPHINE SULFATE 2 MG/ML IJ SOLN
2.0000 mg | INTRAMUSCULAR | Status: DC | PRN
  Administered 2014-09-03: 2 mg via INTRAVENOUS
  Filled 2014-08-31 (×2): qty 1

## 2014-08-31 MED ORDER — ONDANSETRON HCL 4 MG/2ML IJ SOLN
4.0000 mg | Freq: Once | INTRAMUSCULAR | Status: AC
  Administered 2014-08-31: 4 mg via INTRAVENOUS
  Filled 2014-08-31: qty 2

## 2014-08-31 MED ORDER — VANCOMYCIN HCL IN DEXTROSE 1-5 GM/200ML-% IV SOLN
1000.0000 mg | Freq: Once | INTRAVENOUS | Status: AC
  Administered 2014-08-31: 1000 mg via INTRAVENOUS
  Filled 2014-08-31: qty 200

## 2014-08-31 MED ORDER — ISOSORBIDE MONONITRATE ER 60 MG PO TB24
90.0000 mg | ORAL_TABLET | Freq: Every day | ORAL | Status: DC
  Administered 2014-09-01 – 2014-09-05 (×4): 90 mg via ORAL
  Filled 2014-08-31 (×5): qty 1

## 2014-08-31 MED ORDER — BENAZEPRIL HCL 20 MG PO TABS
20.0000 mg | ORAL_TABLET | Freq: Every day | ORAL | Status: DC
  Administered 2014-09-01 – 2014-09-05 (×4): 20 mg via ORAL
  Filled 2014-08-31 (×3): qty 1

## 2014-08-31 MED ORDER — ONDANSETRON HCL 4 MG/2ML IJ SOLN
4.0000 mg | Freq: Four times a day (QID) | INTRAMUSCULAR | Status: DC | PRN
  Administered 2014-09-01: 4 mg via INTRAVENOUS
  Filled 2014-08-31: qty 2

## 2014-08-31 MED ORDER — ACETAMINOPHEN 650 MG RE SUPP: 650.0000 mg | Freq: Four times a day (QID) | RECTAL | Status: DC | PRN

## 2014-08-31 MED ORDER — AMIODARONE HCL 200 MG PO TABS
200.0000 mg | ORAL_TABLET | Freq: Every day | ORAL | Status: DC
  Administered 2014-09-01 – 2014-09-05 (×4): 200 mg via ORAL
  Filled 2014-08-31 (×5): qty 1

## 2014-08-31 MED ORDER — TORSEMIDE 20 MG PO TABS
20.0000 mg | ORAL_TABLET | Freq: Two times a day (BID) | ORAL | Status: DC
  Administered 2014-09-01 (×2): 20 mg via ORAL
  Filled 2014-08-31 (×2): qty 1

## 2014-08-31 MED ORDER — FENTANYL CITRATE (PF) 100 MCG/2ML IJ SOLN
50.0000 ug | Freq: Once | INTRAMUSCULAR | Status: AC
  Administered 2014-08-31: 50 ug via INTRAVENOUS
  Filled 2014-08-31: qty 2

## 2014-08-31 MED ORDER — CARVEDILOL 3.125 MG PO TABS
3.1250 mg | ORAL_TABLET | Freq: Two times a day (BID) | ORAL | Status: DC
  Administered 2014-09-01 – 2014-09-04 (×9): 3.125 mg via ORAL
  Filled 2014-08-31 (×10): qty 1

## 2014-08-31 MED ORDER — ACETAMINOPHEN 325 MG PO TABS: 650.0000 mg | ORAL_TABLET | Freq: Four times a day (QID) | ORAL | Status: DC | PRN

## 2014-08-31 MED ORDER — INSULIN ASPART 100 UNIT/ML ~~LOC~~ SOLN
0.0000 [IU] | Freq: Three times a day (TID) | SUBCUTANEOUS | Status: DC
  Administered 2014-09-01 (×2): 3 [IU] via SUBCUTANEOUS
  Administered 2014-09-01: 2 [IU] via SUBCUTANEOUS
  Administered 2014-09-02 (×2): 3 [IU] via SUBCUTANEOUS
  Administered 2014-09-03 (×3): 1 [IU] via SUBCUTANEOUS
  Administered 2014-09-04 (×2): 2 [IU] via SUBCUTANEOUS
  Administered 2014-09-05: 1 [IU] via SUBCUTANEOUS
  Administered 2014-09-05: 2 [IU] via SUBCUTANEOUS
  Filled 2014-08-31: qty 1
  Filled 2014-08-31: qty 3
  Filled 2014-08-31 (×2): qty 2
  Filled 2014-08-31: qty 1
  Filled 2014-08-31 (×2): qty 3
  Filled 2014-08-31 (×2): qty 1
  Filled 2014-08-31: qty 2
  Filled 2014-08-31: qty 3
  Filled 2014-08-31: qty 2

## 2014-08-31 MED ORDER — INSULIN ASPART 100 UNIT/ML ~~LOC~~ SOLN
0.0000 [IU] | Freq: Every day | SUBCUTANEOUS | Status: DC
  Administered 2014-09-01: 3 [IU] via SUBCUTANEOUS
  Administered 2014-09-02: 2 [IU] via SUBCUTANEOUS
  Filled 2014-08-31: qty 2
  Filled 2014-08-31: qty 3

## 2014-08-31 MED ORDER — GABAPENTIN 400 MG PO CAPS
400.0000 mg | ORAL_CAPSULE | Freq: Three times a day (TID) | ORAL | Status: DC
  Administered 2014-09-01 – 2014-09-05 (×15): 400 mg via ORAL
  Filled 2014-08-31 (×15): qty 1

## 2014-08-31 MED ORDER — PRAVASTATIN SODIUM 20 MG PO TABS
40.0000 mg | ORAL_TABLET | Freq: Every day | ORAL | Status: DC
  Administered 2014-09-01 – 2014-09-05 (×5): 40 mg via ORAL
  Filled 2014-08-31 (×5): qty 2

## 2014-08-31 MED ORDER — PROMETHAZINE HCL 25 MG/ML IJ SOLN
12.5000 mg | Freq: Once | INTRAMUSCULAR | Status: AC
  Administered 2014-08-31 – 2014-09-01 (×2): 12.5 mg via INTRAVENOUS
  Filled 2014-08-31: qty 1

## 2014-08-31 NOTE — H&P (Signed)
[facility]   PATIENT NAME: Sara SohoShirley Gloss    MR#:  [id number]  DATE OF BIRTH:  1931/10/23   DATE OF ADMISSION:  08/31/2014  PRIMARY CARE PHYSICIAN: Fidel LevyJames Hawkins Jr, MD   REQUESTING/REFERRING PHYSICIAN: McLauren  CHIEF COMPLAINT:   Chief Complaint  Patient presents with  . Shortness of Breath    HISTORY OF PRESENT ILLNESS:  [name]  is a 81 y.o. female with a known history of coronary artery disease status post CABG, paroxysmal atrial fibrillation, non-oxygen dependent COPD presenting with shortness of breath. Describes one day duration of shortness of breath with associated cough, productive whitish sputum. With associated nausea and vomiting however denies any chest pain. Does have lower extremity edema which is actually less than baseline, denies any orthopnea. Denies fevers or chills. Emergency department course: Workup reveals pneumonia on chest x-ray  PAST MEDICAL HISTORY:   Past Medical History  Diagnosis Date  . DM2 (diabetes mellitus, type 2)     onset age 81 insulin dependent  . Hyperlipidemia   . COPD (chronic obstructive pulmonary disease)   . Peripheral vascular disease   . CAD (coronary artery disease)     a. MI 1988 s/p CABG in 1988, multiple PCI on SVGs; b. cath 2012: occluded native coronary arteries, patent LIMA to LAD (distal LAD dz), patent SVG-OM & occluded SVG-RCA which filled via left to right collats; c. cath 12/2013 patent LIMA-LAD & SVG-O. known occluded VG-RCA w/ L-R collats, no change since cath 2012  . Chronic systolic CHF (congestive heart failure)     a. 03/2012: EF 40-45%, mild lateral wall HK, mod inf HK, mod post wall HK, mild LVH, mild MR/TR   . Paroxysmal atrial fibrillation     a. CHADSVASc 7: yearly risk of CVA 9.6%;. b. on Eliquis 2.5 mg bid (age, SCr, borderline wt 62.9 Kg)   . HTN (hypertension)   . Yeast infection   . UTI (urinary tract infection)   . 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 Myocardial infarction     PAST SURGICAL HISTORY:   Past Surgical History  Procedure Laterality Date  . Abdominal hysterectomy    . Coronary artery bypass graft    . Appendectomy    . Cholecystectomy    . Ovary surgery    . Angioplasty / stenting femoral      Bilateral SFA stenting  . Femur surgery    . Cardiac catheterization      mc  . Cardiac catheterization  12/2013    SOCIAL HISTORY:   History  Substance Use Topics  . Smoking status: Former Smoker -- 15 years    Types: Cigarettes    Quit date: 02/18/1988  . Smokeless tobacco: Never Used  . Alcohol Use: No    FAMILY HISTORY:   Family History  Problem Relation Age of Onset  . Diabetes Mother   . Heart disease Mother   . Hypertension Mother   . Heart disease Father   . Heart disease Brother     DRUG ALLERGIES:   Allergies  Allergen Reactions  . Contrast Media [Iodinated Diagnostic Agents] Shortness Of Breath  . Tramadol Nausea Only  . Valium [Diazepam] Other (See Comments)    Reaction:  Elevated heart rate  . Iodine Strong [Iodine] Rash  . Penicillins Rash    REVIEW OF SYSTEMS:  REVIEW OF SYSTEMS:  CONSTITUTIONAL: Denies fevers, positive chills, fatigue, weakness.  EYES: Denies blurred  vision, double vision, or eye pain.  EARS, NOSE, THROAT: Denies tinnitus, ear pain, hearing loss.  RESPIRATORY: Positive cough, shortness of breath, denies wheezing  CARDIOVASCULAR: Denies chest pain, palpitations, positive edema.  GASTROINTESTINAL: Positive nausea, vomiting, denies diarrhea, abdominal pain.  GENITOURINARY: Denies dysuria, hematuria.  ENDOCRINE: Denies nocturia or thyroid problems. HEMATOLOGIC AND LYMPHATIC: Denies easy bruising or bleeding.  SKIN: Denies rash or lesions.  MUSCULOSKELETAL: Denies pain in neck, back, shoulder, knees, hips, or further arthritic symptoms.  NEUROLOGIC: Denies paralysis, paresthesias.  PSYCHIATRIC: Denies anxiety or depressive  symptoms. Otherwise full review of systems performed by me is negative.   MEDICATIONS AT HOME:   Prior to Admission medications   Medication Sig Start Date End Date Taking? Authorizing Provider  amiodarone (PACERONE) 200 MG tablet Take 1 tablet (200 mg total) by mouth daily. 02/15/14  Yes Iran Ouch, MD  apixaban (ELIQUIS) 2.5 MG TABS tablet Take 1 tablet (2.5 mg total) by mouth 2 (two) times daily. 05/08/14  Yes Antonieta Iba, MD  benazepril (LOTENSIN) 20 MG tablet Take 20 mg by mouth daily.   Yes Historical Provider, MD  carvedilol (COREG) 3.125 MG tablet Take 1 tablet (3.125 mg total) by mouth 2 (two) times daily. 02/15/14  Yes Iran Ouch, MD  gabapentin (NEURONTIN) 400 MG capsule Take 400 mg by mouth 3 (three) times daily.   Yes Historical Provider, MD  HYDROcodone-acetaminophen (NORCO/VICODIN) 5-325 MG per tablet Take 2 tablets by mouth every 6 (six) hours as needed for moderate pain.    Yes Historical Provider, MD  insulin glargine (LANTUS) 100 UNIT/ML injection Inject 0.1 mLs (10 Units total) into the skin at bedtime. Patient taking differently: Inject 30 Units into the skin at bedtime.  06/18/14  Yes Richard Renae Gloss, MD  insulin lispro (HUMALOG) 100 UNIT/ML injection Inject 0.04 mLs (4 Units total) into the skin 3 (three) times daily before meals. Patient taking differently: Inject 10 Units into the skin 3 (three) times daily before meals.  06/18/14  Yes Alford Highland, MD  isosorbide mononitrate (IMDUR) 60 MG 24 hr tablet Take 90 mg by mouth daily.    Yes Historical Provider, MD  levothyroxine (SYNTHROID, LEVOTHROID) 100 MCG tablet Take 100 mcg by mouth daily.    Yes Historical Provider, MD  nitroGLYCERIN (NITROSTAT) 0.4 MG SL tablet Place 1 tablet (0.4 mg total) under the tongue every 5 (five) minutes as needed for chest pain. 06/18/14  Yes Alford Highland, MD  nystatin cream (MYCOSTATIN) Apply 1 application topically 2 (two) times daily. 08/30/14  Yes [name] Rusty Aus, NP   NYSTATIN, TOPICAL, POWD Apply 1 application topically daily. 08/30/14  Yes [name] Rusty Aus, NP  potassium chloride (K-DUR) 10 MEQ tablet Take 10 mEq by mouth daily.    Yes Historical Provider, MD  pravastatin (PRAVACHOL) 40 MG tablet Take 40 mg by mouth daily.   Yes Historical Provider, MD  torsemide (DEMADEX) 20 MG tablet Take 1 tablet (20 mg total) by mouth 2 (two) times daily. 08/10/14  Yes Iran Ouch, MD      VITAL SIGNS:  Blood pressure 108/88, pulse 85, temperature 97.4 F (36.3 C), temperature source Oral, resp. rate 22, height 5\' 1"  (1.549 m), weight 142 lb (64.411 kg), SpO2 99 %.  PHYSICAL EXAMINATION:  VITAL SIGNS: Filed Vitals:   08/31/14 2051  BP: 108/88  Pulse: 85  Temp: 97.4 F (36.3 C)  Resp: 22   GENERAL:82 y.o.female currently in no acute distress.  HEAD: Normocephalic, atraumatic.  EYES: Pupils equal,  round, reactive to light. Extraocular muscles intact. No scleral icterus.  MOUTH: Moist mucosal membrane. Dentition intact. No abscess noted.  EAR, NOSE, THROAT: Clear without exudates. No external lesions.  NECK: Supple. No thyromegaly. No nodules. No JVD.  PULMONARY: Diminished breath sounds throughout all lung fields scant wheezing noted most prominent right upper, No use of accessory muscles, poor respiratory effort. Poor air entry bilaterally CHEST: Nontender to palpation.  CARDIOVASCULAR: S1 and S2. Regular rate and rhythm. No murmurs, rubs, or gallops. 1+ edema left leg greater than right. Pedal pulses 2+ bilaterally.  GASTROINTESTINAL: Soft, nontender, nondistended. No masses. Positive bowel sounds. No hepatosplenomegaly.  MUSCULOSKELETAL: No swelling, clubbing, or edema. Range of motion full in all extremities.  NEUROLOGIC: Cranial nerves II through XII are intact. No gross focal neurological deficits. Sensation intact. Reflexes intact.  SKIN: No ulceration, lesions, rashes, or cyanosis. Skin warm and dry. Turgor intact.  PSYCHIATRIC: Mood, affect  within normal limits. The patient is awake, alert and oriented x 3. Insight, judgment intact.    LABORATORY PANEL:   CBC  Recent Labs Lab 08/31/14 2058  WBC 7.1  HGB 9.8*  HCT 32.0*  PLT 308   ------------------------------------------------------------------------------------------------------------------  Chemistries  No results for input(s): NA, K, CL, CO2, GLUCOSE, BUN, CREATININE, CALCIUM, MG, AST, ALT, ALKPHOS, BILITOT in the last 168 hours.  Invalid input(s): GFRCGP ------------------------------------------------------------------------------------------------------------------  Cardiac Enzymes No results for input(s): TROPONINI in the last 168 hours. ------------------------------------------------------------------------------------------------------------------  RADIOLOGY:  Dg Chest 2 View  08/31/2014   CLINICAL DATA:  Shortness of Breath  EXAM: CHEST  2 VIEW  COMPARISON:  August 13, 2014  FINDINGS: There is focal left base infiltrate, obscuring the left hemidiaphragm posteriorly. Lungs elsewhere clear. Heart is mildly enlarged with pulmonary vascularity within normal limits. There is atherosclerotic change in aorta. Patient is status post coronary artery bypass grafting. There is evidence of an old healed fracture of the proximal humeral metaphysis. Bones are diffusely osteoporotic.  IMPRESSION: Focally of infiltrate left base. Lungs otherwise clear. Atherosclerotic calcification present. Heart mildly enlarged.  Followup PA and lateral chest radiographs recommended in 3-4 weeks following trial of antibiotic therapy to ensure resolution and exclude underlying malignancy.   Electronically Signed   By: Bretta Bang III M.D.   On: 08/31/2014 21:36    EKG:   Orders placed or performed during the hospital encounter of 08/31/14  . ED EKG within 10 minutes  . ED EKG within 10 minutes    IMPRESSION AND PLAN:   81 year old Caucasian female with significant cardiovascular  history including CABG and paroxysmal A. fib presenting with shortness of breath.  1. Healthcare associated pneumonia: She is recently admitted in the hospital less than a month ago for ESBL UTI she assess currently finished those antibiotics, but given the timeline this does qualify her for healthcare associated as opposed to community-acquired pneumonia. Provide submental oxygen, DuoNeb treatments, antibiotics coverage with aztreonam and vancomycin given her penicillin allergy, follow culture data and adjust antibiotics accordingly 2. Type 2 diabetes insulin requiring: Continue basal insulin at insulin sliding scale 3. Coronary artery disease status post CABG: No active symptoms at this time continue with Coreg, imdur, Pravachol 4. Hypothyroidism unspecified: Synthroid 5. Venous thromboembolism prophylactic: Eliquis    All the records are reviewed and case discussed with ED provider. Management plans discussed with the patient, family and they are in agreement.  CODE STATUS: Full  TOTAL TIME TAKING CARE OF THIS PATIENT: 35 minutes.    Hower,  Mardi Mainland.D on 08/31/2014  at 10:25 PM  Between 7am to 6pm - Pager - [phone]  After 6pm: House Pager: - [phone]  [facility]  Office  [phone]  CC: Primary care physician; Fidel Levy, MD

## 2014-08-31 NOTE — ED Provider Notes (Signed)
[facility] Emergency Department Provider Note ____________________________________________  Time seen: Approximately 9:47 PM  I have reviewed the triage vital signs and the nursing notes.   HISTORY  Chief Complaint Shortness of Breath    HPI [name] is a 81 y.o. female with history of CHF, CAD, PVD, COPD recently admitted June 24-26 for ESBL UTI who has been short of breath since yesterday but awoke with worsening symptoms at 2 AM. She reports central chest tightness and feels "I just can't get comfortable". She has had bilateral peripheral edema for the past week. Dr. Kirke Corin had switched her Lasix 40 mg to torsemide 3 weeks ago because of worsening kidney function. He was trying to get her prepared for vascular surgery with Dr. Illene Bolus and Dr. Wyn Quaker for a lower extremity bypass in preparation for a left hip replacement.  She states her symptoms feel similar to when she has had numerous CHF exacerbations. She was seen in her primary care office yesterday for candidal infection of her lower abdomen and the nurse practitioner noted crackles in her left base, but it was not worked up at the time.  She denies fever.  Past Medical History  Diagnosis Date  . DM2 (diabetes mellitus, type 2)     onset age 80 insulin dependent  . Hyperlipidemia   . COPD (chronic obstructive pulmonary disease)   . Peripheral vascular disease   . CAD (coronary artery disease)     a. MI 1988 s/p CABG in 1988, multiple PCI on SVGs; b. cath 2012: occluded native coronary arteries, patent LIMA to LAD (distal LAD dz), patent SVG-OM & occluded SVG-RCA which filled via left to right collats; c. cath 12/2013 patent LIMA-LAD & SVG-O. known occluded VG-RCA w/ L-R collats, no change since cath 2012  . Chronic systolic CHF (congestive heart failure)     a. 03/2012: EF 40-45%, mild lateral wall HK, mod inf HK, mod post wall HK, mild LVH, mild MR/TR   . Paroxysmal atrial fibrillation     a.  CHADSVASc 7: yearly risk of CVA 9.6%;. b. on Eliquis 2.5 mg bid (age, SCr, borderline wt 62.9 Kg)   . HTN (hypertension)   . Yeast infection   . UTI (urinary tract infection)   . Atherosclerosis of native artery of left lower extremity   . Myocardial infarction     Patient Active Problem List   Diagnosis Date Noted  . Healthcare-associated pneumonia 08/31/2014  . UTI (lower urinary tract infection) 08/11/2014  . Anemia 07/25/2014  . NSTEMI, initial episode of care 06/17/2014  . Degenerative arthritis of hip 04/11/2014  . Pre-operative cardiovascular examination 02/27/2014  . COPD (chronic obstructive pulmonary disease)   . DM2 (diabetes mellitus, type 2)   . Hyperlipidemia   . Chest pain 12/21/2013  . Femoral distal fracture 07/28/2013  . Chronic systolic heart failure 05/03/2013  . Congestive heart failure 04/08/2013  . CAD (coronary artery disease)   . Peripheral vascular disease   . Paroxysmal atrial fibrillation   . HTN (hypertension)   . Atherosclerosis of native arteries of the extremities with intermittent claudication 03/19/2011    Past Surgical History  Procedure Laterality Date  . Abdominal hysterectomy    . Coronary artery bypass graft    . Appendectomy    . Cholecystectomy    . Ovary surgery    . Angioplasty / stenting femoral      Bilateral SFA stenting  . Femur surgery    . Cardiac catheterization  mc  . Cardiac catheterization  12/2013    Current Outpatient Rx  Name  Route  Sig  Dispense  Refill  . amiodarone (PACERONE) 200 MG tablet   Oral   Take 1 tablet (200 mg total) by mouth daily.   90 tablet   3   . apixaban (ELIQUIS) 2.5 MG TABS tablet   Oral   Take 1 tablet (2.5 mg total) by mouth 2 (two) times daily.   60 tablet   6   . benazepril (LOTENSIN) 20 MG tablet      TAKE 1 TABLET (20 MG TOTAL) BY MOUTH DAILY.   30 tablet   5     No refills available   . carvedilol (COREG) 3.125 MG tablet   Oral   Take 1 tablet (3.125 mg total)  by mouth 2 (two) times daily.   180 tablet   3   . ertapenem (INVANZ) 1 G injection   Intramuscular   Inject 0.5 g into the muscle daily. For 7 days   7 vial   0   . gabapentin (NEURONTIN) 400 MG capsule   Oral   Take 400 mg by mouth 3 (three) times daily.         [name] HYDROcodone-acetaminophen (NORCO/VICODIN) 5-325 MG per tablet   Oral   Take 2 tablets by mouth every 6 (six) hours as needed for moderate pain.          [name] insulin glargine (LANTUS) 100 UNIT/ML injection   Subcutaneous   Inject 0.1 mLs (10 Units total) into the skin at bedtime.   10 mL   11   . insulin lispro (HUMALOG) 100 UNIT/ML injection   Subcutaneous   Inject 0.04 mLs (4 Units total) into the skin 3 (three) times daily before meals. Patient taking differently: Inject 10 Units into the skin 3 (three) times daily with meals.    10 mL   11   . isosorbide mononitrate (IMDUR) 60 MG 24 hr tablet   Oral   Take 1.5 mg by mouth daily.         [name] levothyroxine (SYNTHROID, LEVOTHROID) 100 MCG tablet   Oral   Take 100 mcg by mouth daily before breakfast.         . nitrofurantoin (MACRODANTIN) 100 MG capsule   Oral   Take 100 mg by mouth 4 (four) times daily.         . nitroGLYCERIN (NITROSTAT) 0.4 MG SL tablet   Sublingual   Place 1 tablet (0.4 mg total) under the tongue every 5 (five) minutes as needed for chest pain.   30 tablet   0   . nystatin cream (MYCOSTATIN)   Topical   Apply 1 application topically 2 (two) times daily.   30 g   1   . NYSTATIN, TOPICAL, POWD   Apply externally   Apply 1 application topically daily.      0   . ondansetron (ZOFRAN) 4 MG tablet   Oral   Take 1 tablet (4 mg total) by mouth every 8 (eight) hours as needed for nausea or vomiting.   15 tablet   0   . potassium chloride (K-DUR) 10 MEQ tablet   Oral   Take 10 mEq by mouth daily.          . pravastatin (PRAVACHOL) 40 MG tablet   Oral   Take 40 mg by mouth daily.         [name] torsemide (DEMADEX) 20  MG  tablet   Oral   Take 1 tablet (20 mg total) by mouth 2 (two) times daily.   180 tablet   3     Allergies Contrast media; Tramadol; Valium; Iodine strong; and Penicillins  Family History  Problem Relation Age of Onset  . Diabetes Mother   . Heart disease Mother   . Hypertension Mother   . Heart disease Father   . Heart disease Brother     Social History History  Substance Use Topics  . Smoking status: Former Smoker -- 15 years    Types: Cigarettes    Quit date: 02/18/1988  . Smokeless tobacco: Never Used  . Alcohol Use: No    Review of Systems Constitutional: No fever Cardiovascular: See history of present illness Respiratory: See history of present illness Gastrointestinal: No abdominal pain.   Psychiatric:Has had a difficult time since husband of 60 years died one year ago 10-point ROS otherwise negative.  ____________________________________________   PHYSICAL EXAM:  VITAL SIGNS: ED Triage Vitals  Enc Vitals Group     BP 08/31/14 2051 108/88 mmHg     Pulse Rate 08/31/14 2051 85     Resp 08/31/14 2051 22     Temp 08/31/14 2051 97.4 F (36.3 C)     Temp Source 08/31/14 2051 Oral     SpO2 08/31/14 2051 99 %     Weight 08/31/14 2051 142 lb (64.411 kg)     Height 08/31/14 2051 5\' 1"  (1.549 m)     Head Cir --      Peak Flow --      Pain Score 08/31/14 2050 7     Pain Loc --      Pain Edu? --      Excl. in GC? --    Constitutional: Alert and oriented. Appears to feel unwell; pale; mildly tachypneic Eyes: Conjunctivae are pale. PERRL. EOMI. Head: Atraumatic. Nose: No congestion/rhinnorhea. Mouth/Throat: Mucous membranes are dry.  Oropharynx non-erythematous. Neck: No stridor.   Lymphatic: No cervical lymphadenopathy. Cardiovascular: Normal rate, regular rhythm. Grossly normal heart sounds.  Peripheral pulses 1+ B DP Respiratory: Mild tachypnea. Crackles at the left base. Gastrointestinal: Soft and nontender. No distention. Normal bowel sounds.   Musculoskeletal: 1+ pitting edema bilateral lower extremities.  No calf TTP. Neurologic:  Normal speech and language. No gross focal neurologic deficits are appreciated. Speech is normal.  Skin:  Skin is warm, dry and intact. Erythematous area below left pannus, consistent with yeast Psychiatric: Mood and affect are normal. Speech and behavior are normal.  ____________________________________________   LABS (all labs ordered are listed, but only abnormal results are displayed)  Labs Reviewed  CBC - Abnormal; Notable for the following:    Hemoglobin 9.8 (*)    HCT 32.0 (*)    MCH 25.3 (*)    MCHC 30.6 (*)    RDW 17.6 (*)    All other components within normal limits  CULTURE, BLOOD (ROUTINE X 2)  CULTURE, BLOOD (ROUTINE X 2)  BASIC METABOLIC PANEL  TROPONIN I  BRAIN NATRIURETIC PEPTIDE  BASIC METABOLIC PANEL  CBC   ____________________________________________  EKG   Date: 08/31/2014  Rate: 86  Rhythm: normal sinus rhythm  QRS Axis: normal QRS: Right bundle blanch block, Q in 1  Intervals: normal  ST/T Wave abnormalities: T inversion in 3, V1 through V3  Conduction Disutrbances: none  Narrative Interpretation: unremarkable  ____________________________________________  RADIOLOGY  I personally viewed chest x-ray and reviewed Chest x-ray-Focally of infiltrate left base. Lungs otherwise  clear. Atherosclerotic calcification present. Heart mildly enlarged.  Followup PA and lateral chest radiographs recommended in 3-4 weeks following trial of antibiotic therapy to ensure resolution and exclude underlying malignancy. ____________________________________________   PROCEDURES  Procedure(s) performed: none  Critical Care performed: none ____________________________________________   INITIAL IMPRESSION / ASSESSMENT AND PLAN / ED COURSE  Pertinent labs & imaging results that were available during my care of the patient were reviewed by me and considered in my  medical decision making (see chart for details).  Patient with 3 day hospital admission less than 1 month ago. We will treat for hospital-acquired pneumonia and admit to [facility]. I discussed case with Dr. Clint Guy. ____________________________________________   FINAL CLINICAL IMPRESSION(S) / ED DIAGNOSES Hospital-acquired pneumonia, left lower lobe     Maurilio Lovely, MD 08/31/14 2334

## 2014-08-31 NOTE — ED Notes (Signed)
Pt reports she cannot get her breath, nausea,  and having chest pain since about 2am this morning

## 2014-09-01 ENCOUNTER — Ambulatory Visit: Payer: Self-pay | Admitting: Urology

## 2014-09-01 ENCOUNTER — Telehealth: Payer: Self-pay | Admitting: Family Medicine

## 2014-09-01 DIAGNOSIS — I481 Persistent atrial fibrillation: Secondary | ICD-10-CM

## 2014-09-01 DIAGNOSIS — I5023 Acute on chronic systolic (congestive) heart failure: Principal | ICD-10-CM

## 2014-09-01 DIAGNOSIS — R7989 Other specified abnormal findings of blood chemistry: Secondary | ICD-10-CM

## 2014-09-01 LAB — CBC
HCT: 27.8 % — ABNORMAL LOW (ref 35.0–47.0)
HEMOGLOBIN: 8.6 g/dL — AB (ref 12.0–16.0)
MCH: 25.6 pg — AB (ref 26.0–34.0)
MCHC: 31.1 g/dL — ABNORMAL LOW (ref 32.0–36.0)
MCV: 82.5 fL (ref 80.0–100.0)
PLATELETS: 232 10*3/uL (ref 150–440)
RBC: 3.37 MIL/uL — AB (ref 3.80–5.20)
RDW: 17.7 % — ABNORMAL HIGH (ref 11.5–14.5)
WBC: 6.4 10*3/uL (ref 3.6–11.0)

## 2014-09-01 LAB — BASIC METABOLIC PANEL
Anion gap: 11 (ref 5–15)
BUN: 34 mg/dL — ABNORMAL HIGH (ref 6–20)
CHLORIDE: 103 mmol/L (ref 101–111)
CO2: 25 mmol/L (ref 22–32)
CREATININE: 1.92 mg/dL — AB (ref 0.44–1.00)
Calcium: 8.5 mg/dL — ABNORMAL LOW (ref 8.9–10.3)
GFR calc Af Amer: 27 mL/min — ABNORMAL LOW (ref >60)
GFR, EST NON AFRICAN AMERICAN: 24 mL/min — AB (ref >60)
GLUCOSE: 224 mg/dL — AB (ref 65–99)
POTASSIUM: 4.5 mmol/L (ref 3.5–5.1)
Sodium: 139 mmol/L (ref 135–145)

## 2014-09-01 LAB — GLUCOSE, CAPILLARY
GLUCOSE-CAPILLARY: 210 mg/dL — AB (ref 65–99)
GLUCOSE-CAPILLARY: 182 mg/dL — AB (ref 65–99)
GLUCOSE-CAPILLARY: 177 mg/dL — AB (ref 65–99)
Glucose-Capillary: 218 mg/dL — ABNORMAL HIGH (ref 65–99)
Glucose-Capillary: 276 mg/dL — ABNORMAL HIGH (ref 65–99)

## 2014-09-01 LAB — TROPONIN I
Troponin I: 0.59 ng/mL — ABNORMAL HIGH (ref <0.031)
Troponin I: 0.77 ng/mL — ABNORMAL HIGH (ref <0.031)
Troponin I: 1.74 ng/mL — ABNORMAL HIGH (ref <0.031)

## 2014-09-01 LAB — URINE CULTURE

## 2014-09-01 MED ORDER — SODIUM CHLORIDE 0.9 % IJ SOLN
3.0000 mL | INTRAMUSCULAR | Status: DC | PRN
  Administered 2014-09-01 (×2): 3 mL via INTRAVENOUS
  Filled 2014-09-01 (×2): qty 10

## 2014-09-01 MED ORDER — PROMETHAZINE HCL 25 MG/ML IJ SOLN: 12.5000 mg | INTRAMUSCULAR | Status: DC | PRN

## 2014-09-01 MED ORDER — FUROSEMIDE 10 MG/ML IJ SOLN
60.0000 mg | Freq: Two times a day (BID) | INTRAMUSCULAR | Status: DC
  Administered 2014-09-01: 60 mg via INTRAVENOUS
  Filled 2014-09-01 (×2): qty 6

## 2014-09-01 MED ORDER — SODIUM CHLORIDE 0.9 % IJ SOLN
3.0000 mL | Freq: Two times a day (BID) | INTRAMUSCULAR | Status: DC
  Administered 2014-09-01 – 2014-09-05 (×6): 3 mL via INTRAVENOUS

## 2014-09-01 MED ORDER — PROMETHAZINE HCL 25 MG/ML IJ SOLN
INTRAMUSCULAR | Status: AC
  Administered 2014-09-01: 12.5 mg via INTRAVENOUS
  Filled 2014-09-01: qty 1

## 2014-09-01 MED ORDER — SODIUM CHLORIDE 0.9 % IV SOLN
500.0000 mg | Freq: Two times a day (BID) | INTRAVENOUS | Status: DC
  Administered 2014-09-01 – 2014-09-05 (×10): 500 mg via INTRAVENOUS
  Filled 2014-09-01 (×12): qty 0.5

## 2014-09-01 MED ORDER — VANCOMYCIN HCL 500 MG IV SOLR
500.0000 mg | INTRAVENOUS | Status: DC
  Administered 2014-09-01 – 2014-09-03 (×3): 500 mg via INTRAVENOUS
  Filled 2014-09-01 (×4): qty 500

## 2014-09-01 MED ORDER — DEXTROSE 5 % IV SOLN
1.0000 g | Freq: Three times a day (TID) | INTRAVENOUS | Status: DC
  Administered 2014-09-01 (×2): 1 g via INTRAVENOUS
  Filled 2014-09-01 (×5): qty 1

## 2014-09-01 NOTE — Consult Note (Signed)
Cardiology Consultation Note  Patient ID: Clent DemarkShirley A Culliver, MRN: [id number], DOB/AGE: 1931-09-18 81 y.o. Admit date: 08/31/2014   Date of Consult: 09/01/2014 Primary Physician: Fidel LevyJames Hawkins Jr, MD Primary Cardiologist: Dr. Kirke CorinArida, MD  Chief Complaint: SOB Reason for Consult: Elevated troponin   HPI: 81 y.o. female with h/o CAD s/p CABG in 1988, PAF on Eliquis, ischemic cardiomyopathy, COPD, PAD/PVD, DM2, HTN, and HLD who presented to [facility] with increased SOB and signs of volume overload and was found to have a left base pneumonia.  She has recently been hospitalized in June for ESBL UTI that was successfully treated. Follow up urine culture for clearing is still pending at this time. Over the past several weeks she has been experiencing increased SOB and at times has been using 2-3 pillows at nighttime to help her breathe. Her Lasix was changed to torsemide in early June in an effort to help diurese. Her lower extremity swelling waxes and wanes. She does watch her salt in take and does not drink more than 2L of fluids daily.     In April she was hospitalized for NSTEMI and underwent cardiac cath that showed occluded native vessels with patent LIMA to LAD and SVG to OM. The SVG to RCA is chronically occluded and fills via left-to-right collaterals. There was diffuse disease in the distal LAD beyond the anastomosis. Ejection fraction was 50 % by echo. There was no significant change from previous cardiac catheterization in 2012.  She has previously been scheduled for left hip surgery, but this has been canceled 2/2 her above SOB and ESBL UTI hospitalization. At this time it has not been rescheduled. She continues to have hip pain.    [facility] with increased SOB and cough that was productive of white sputum over the past couple of days. No associated chest pain, nausea, vomiting, palpitations, diaphoresis, presyncope, or syncope. She presented to her PCP on 7/13 for evaluation of  separate issue of possible candidal infection on her abdomen and was told she may have PNA and was advised to come to the ED. Her weight has remained stable.   Upon her arrival to the ED her CXR showed left base PNA. Troponin 0.58-->0.59-->0.77. EKG NSR, 86 bpm, RBBB, poor R wave progression, nonspecific inferolateral st/t changes anterior TWI. SCr 1.82-->1.92. hgb 8.6. She was started on vancomycin, Levaquin, and aztreonam for her above PNA. She has remained without chest pain. She reports her SOB is improved this morning compared to prior.      Past Medical History  Diagnosis Date  . DM2 (diabetes mellitus, type 2)     onset age 81 insulin dependent  . Hyperlipidemia   . COPD (chronic obstructive pulmonary disease)   . Peripheral vascular disease   . CAD (coronary artery disease)     a. MI 1988 s/p CABG in 1988, multiple PCI on SVGs; b. cath 2012: occluded native coronary arteries, patent LIMA to LAD (distal LAD dz), patent SVG-OM & occluded SVG-RCA which filled via left to right collats; c. cath 12/2013 patent LIMA-LAD & SVG-O. known occluded VG-RCA w/ L-R collats, no change since cath 2012  . Chronic systolic CHF (congestive heart failure)     a. 03/2012: EF 40-45%, mild lateral wall HK, mod inf HK, mod post wall HK, mild LVH, mild MR/TR   . Paroxysmal atrial fibrillation     a. CHADSVASc 7: yearly risk of CVA 9.6%;. b. on Eliquis 2.5 mg bid (age, SCr, borderline wt 62.9 Kg)   .  HTN (hypertension)   . Yeast infection   . UTI (urinary tract infection)   . Atherosclerosis of native artery of left lower extremity   . Myocardial infarction       Most Recent Cardiac Studies: As above   Surgical History:  Past Surgical History  Procedure Laterality Date  . Abdominal hysterectomy    . Coronary artery bypass graft    . Appendectomy    . Cholecystectomy    . Ovary surgery    . Angioplasty / stenting femoral      Bilateral SFA stenting  . Femur surgery    . Cardiac catheterization        [facility]  . Cardiac catheterization  12/2013     Home Meds: Prior to Admission medications   Medication Sig Start Date End Date Taking? Authorizing Provider  amiodarone (PACERONE) 200 MG tablet Take 1 tablet (200 mg total) by mouth daily. 02/15/14  Yes Iran Ouch, MD  apixaban (ELIQUIS) 2.5 MG TABS tablet Take 1 tablet (2.5 mg total) by mouth 2 (two) times daily. 05/08/14  Yes Antonieta Iba, MD  benazepril (LOTENSIN) 20 MG tablet Take 20 mg by mouth daily.   Yes Historical Provider, MD  carvedilol (COREG) 3.125 MG tablet Take 1 tablet (3.125 mg total) by mouth 2 (two) times daily. 02/15/14  Yes Iran Ouch, MD  gabapentin (NEURONTIN) 400 MG capsule Take 400 mg by mouth 3 (three) times daily.   Yes Historical Provider, MD  HYDROcodone-acetaminophen (NORCO/VICODIN) 5-325 MG per tablet Take 2 tablets by mouth every 6 (six) hours as needed for moderate pain.    Yes Historical Provider, MD  insulin glargine (LANTUS) 100 UNIT/ML injection Inject 0.1 mLs (10 Units total) into the skin at bedtime. Patient taking differently: Inject 30 Units into the skin at bedtime.  06/18/14  Yes Richard Renae Gloss, MD  insulin lispro (HUMALOG) 100 UNIT/ML injection Inject 0.04 mLs (4 Units total) into the skin 3 (three) times daily before meals. Patient taking differently: Inject 10 Units into the skin 3 (three) times daily before meals.  06/18/14  Yes Alford Highland, MD  isosorbide mononitrate (IMDUR) 60 MG 24 hr tablet Take 90 mg by mouth daily.    Yes Historical Provider, MD  levothyroxine (SYNTHROID, LEVOTHROID) 100 MCG tablet Take 100 mcg by mouth daily.    Yes Historical Provider, MD  nitroGLYCERIN (NITROSTAT) 0.4 MG SL tablet Place 1 tablet (0.4 mg total) under the tongue every 5 (five) minutes as needed for chest pain. 06/18/14  Yes Alford Highland, MD  nystatin cream (MYCOSTATIN) Apply 1 application topically 2 (two) times daily. 08/30/14  Yes [name] Rusty Aus, NP  NYSTATIN, TOPICAL, POWD Apply 1  application topically daily. 08/30/14  Yes [name] Rusty Aus, NP  potassium chloride (K-DUR) 10 MEQ tablet Take 10 mEq by mouth daily.    Yes Historical Provider, MD  pravastatin (PRAVACHOL) 40 MG tablet Take 40 mg by mouth daily.   Yes Historical Provider, MD  torsemide (DEMADEX) 20 MG tablet Take 1 tablet (20 mg total) by mouth 2 (two) times daily. 08/10/14  Yes Iran Ouch, MD    Inpatient Medications:  . amiodarone  200 mg Oral Daily  . apixaban  2.5 mg Oral BID  . aztreonam  1 g Intravenous 3 times per day  . benazepril  20 mg Oral Daily  . benazepril  20 mg Oral Daily  . carvedilol  3.125 mg Oral BID  . gabapentin  400 mg Oral TID  .  insulin aspart  0-5 Units Subcutaneous QHS  . insulin aspart  0-9 Units Subcutaneous TID WC  . insulin glargine  30 Units Subcutaneous QHS  . isosorbide mononitrate  90 mg Oral Daily  . levothyroxine  100 mcg Oral QAC breakfast  . nystatin cream  1 application Topical BID  . pravastatin  40 mg Oral Daily  . sodium chloride  3 mL Intravenous Q12H  . torsemide  20 mg Oral BID  . vancomycin  500 mg Intravenous Q24H      Allergies:  Allergies  Allergen Reactions  . Contrast Media [Iodinated Diagnostic Agents] Shortness Of Breath  . Tramadol Nausea Only  . Valium [Diazepam] Other (See Comments)    Reaction:  Elevated heart rate  . Iodine Strong [Iodine] Rash  . Penicillins Rash    History   Social History  . Marital Status: Widowed    Spouse Name: N/A  . Number of Children: N/A  . Years of Education: N/A   Occupational History  . Not on file.   Social History Main Topics  . Smoking status: Former Smoker -- 15 years    Types: Cigarettes    Quit date: 02/18/1988  . Smokeless tobacco: Never Used  . Alcohol Use: No  . Drug Use: No  . Sexual Activity: Not on file   Other Topics Concern  . Not on file   Social History Narrative     Family History  Problem Relation Age of Onset  . Diabetes Mother   . Heart disease Mother    . Hypertension Mother   . Heart disease Father   . Heart disease Brother      Review of Systems: Review of Systems  Constitutional: Positive for malaise/fatigue. Negative for fever, chills, weight loss and diaphoresis.  HENT: Negative for congestion.   Eyes: Negative for blurred vision, pain, discharge and redness.  Respiratory: Positive for cough, sputum production, shortness of breath and wheezing. Negative for hemoptysis.        White sputum   Cardiovascular: Positive for orthopnea and leg swelling. Negative for chest pain, palpitations, claudication and PND.  Gastrointestinal: Negative for heartburn, nausea, vomiting, abdominal pain, blood in stool and melena.  Genitourinary: Positive for dysuria, urgency and frequency. Negative for hematuria and flank pain.  Musculoskeletal: Negative for myalgias, back pain, joint pain, falls and neck pain.  Skin: Negative for itching and rash.  Neurological: Positive for weakness. Negative for dizziness, tingling, tremors, sensory change, speech change, focal weakness, loss of consciousness and headaches.  Endo/Heme/Allergies: Does not bruise/bleed easily.  Psychiatric/Behavioral: Negative for depression and substance abuse. The patient is not nervous/anxious.   All other systems reviewed and are negative.   Labs:  Recent Labs  08/31/14 2058 09/01/14 0011 09/01/14 0509  TROPONINI 0.58* 0.59* 0.77*   Lab Results  Component Value Date   WBC 6.4 09/01/2014   HGB 8.6* 09/01/2014   HCT 27.8* 09/01/2014   MCV 82.5 09/01/2014   PLT 232 09/01/2014    Recent Labs Lab 09/01/14 0509  NA 139  K 4.5  CL 103  CO2 25  BUN 34*  CREATININE 1.92*  CALCIUM 8.5*  GLUCOSE 224*   Lab Results  Component Value Date   CHOL 144 11/16/2010   HDL 45 11/16/2010   LDLCALC 78 11/16/2010   TRIG 105 11/16/2010   Lab Results  Component Value Date   DDIMER 0.93* 11/15/2010    Radiology/Studies:  Dg Chest 2 View  08/31/2014   CLINICAL DATA:  Shortness of Breath  EXAM: CHEST  2 VIEW  COMPARISON:  August 13, 2014  FINDINGS: There is focal left base infiltrate, obscuring the left hemidiaphragm posteriorly. Lungs elsewhere clear. Heart is mildly enlarged with pulmonary vascularity within normal limits. There is atherosclerotic change in aorta. Patient is status post coronary artery bypass grafting. There is evidence of an old healed fracture of the proximal humeral metaphysis. Bones are diffusely osteoporotic.  IMPRESSION: Focally of infiltrate left base. Lungs otherwise clear. Atherosclerotic calcification present. Heart mildly enlarged.  Followup PA and lateral chest radiographs recommended in 3-4 weeks following trial of antibiotic therapy to ensure resolution and exclude underlying malignancy.   Electronically Signed   By: Bretta Bang III M.D.   On: 08/31/2014 21:36   Dg Chest Port 1 View  08/13/2014   CLINICAL DATA:  Acute onset of shortness of breath. Initial encounter.  EXAM: PORTABLE CHEST - 1 VIEW  COMPARISON:  Chest radiograph performed 06/16/2014  FINDINGS: The lungs are well-aerated. Mild vascular congestion is noted. Mild bibasilar opacities likely reflect atelectasis, though minimal interstitial edema might have a similar appearance. There is no evidence of pleural effusion or pneumothorax.  The cardiomediastinal silhouette is borderline enlarged. The patient is status post median sternotomy, with evidence of prior CABG. No acute osseous abnormalities are seen.  IMPRESSION: Mild vascular congestion and borderline cardiomegaly. Mild bibasilar airspace opacities likely reflect atelectasis, though minimal interstitial edema might have a similar appearance.   Electronically Signed   By: Roanna Raider M.D.   On: 08/13/2014 05:33    EKG: NSR, 86 bpm, RBBB, poor R wave progression, nonspecific inferolateral st/t changes anterior TWI    Weights: Filed Weights   08/31/14 2051 09/01/14 0047  Weight: 142 lb (64.411 kg) 149 lb 4.8 oz  (67.722 kg)     Physical Exam: Blood pressure 132/64, pulse 70, temperature 97.5 F (36.4 C), temperature source Oral, resp. rate 18, height 5\' 1"  (1.549 m), weight 149 lb 4.8 oz (67.722 kg), SpO2 90 %. Body mass index is 28.22 kg/(m^2). General: Well developed, well nourished, in no acute distress. Head: Normocephalic, atraumatic, sclera non-icteric, no xanthomas, nares are without discharge.  Neck: Negative for carotid bruits. JVD not elevated. Lungs: Rhonchi left base, decrease breath sounds throughout. Breathing is unlabored. Heart: RRR with S1 S2. II/VI systolic murmur RUSB. No rubs or gallops appreciated. Abdomen: Soft, non-tender, non-distended with normoactive bowel sounds. No hepatomegaly. No rebound/guarding. No obvious abdominal masses. Msk:  Strength and tone appear normal for age. Extremities: No clubbing or cyanosis. Trace pitting edema to mid calves.  Distal pedal pulses are 2+ and equal bilaterally. Neuro: Alert and oriented X 3. No facial asymmetry. No focal deficit. Moves all extremities spontaneously. Psych:  Responds to questions appropriately with a normal affect.    Assessment and Plan:  81 y.o. female with h/o CAD s/p CABG in 1988, PAF on Eliquis, ischemic cardiomyopathy, COPD, PAD/PVD, DM2, HTN, and HLD who presented to [facility] with increased SOB and signs of volume overload and was found to have a left base pneumonia and mildly elevated troponin.  1. Elevated troponin/CAD s/p CABG: -Mildly elevated and generally flat trending as above -Patient with recent cardiac cath in April 2016 that was unchanged from prior cardiac cath 2012  -Elevated troponin likely supply demand ischemia in the setting of her left base pneumonia, anemia (chronic issue), and acute on chronic systolic CHF -No further ischemic evaluation at this time given the above recent cardiac catheterization  -No angina  -Continue  Coreg 3.125 mg bid, Imdur 90 mg daily, pravastatin 40 mg   2. Acute on  chronic CHF: -She has been experiencing increased SOB with orthopnea over the past several weeks -Lasix was changed to torsemide in early June in an effort to aid in diuresis  -SCr stable -Continue torsemide 20 mg bid, Coreg 3.125 mg bid, benazepril 20 mg daily  3. PAF: -Currently in sinus -On Eliquis 2.5 mg bid -Continue amiodarone 200 mg daily -CHADSVASc at least 7 giving her an estimated annual stroke risk of 9.6%   4. HCAP: -Continue ABX and nebs per IM   5. History of ESBL UTI: -Urine culture from 7/13 done at PCP office growing E coli   6. IDDM: -Per IM  7. Hypothyroidism     Signed, Eula Listen, PA-C Pager: [phone] 09/01/2014, 9:15 AM

## 2014-09-01 NOTE — Progress Notes (Signed)
Notified physician for anti nausea medication, zofran was ineffective. Order given for phenergan.

## 2014-09-01 NOTE — Care Management (Signed)
Found a note from urology that patient urine culture from 7/13 is positive for E coli

## 2014-09-01 NOTE — Progress Notes (Signed)
ANTIBIOTIC CONSULT NOTE - INITIAL  Pharmacy Consult for Vancomycin/Aztreonam Indication: HCAP  Allergies  Allergen Reactions  . Contrast Media [Iodinated Diagnostic Agents] Shortness Of Breath  . Tramadol Nausea Only  . Valium [Diazepam] Other (See Comments)    Reaction:  Elevated heart rate  . Iodine Strong [Iodine] Rash  . Penicillins Rash    Patient Measurements: Height:  (154.9 cm) Weight: 149 lb 4.8 oz (67.722 kg) IBW/kg (Calculated) : 47.8 Adjusted Body Weight: 56 kg  Vital Signs: Temp: 97.7 F (36.5 C) (07/15 0047) Temp Source: Oral (07/14 2345) BP: 160/76 mmHg (07/15 0047) Pulse Rate: 85 (07/15 0047) Intake/Output from previous day: 07/14 0701 - 07/15 0700 In: 3 [I.V.:3] Out: -  Intake/Output from this shift: Total I/O In: 3 [I.V.:3] Out: -   Labs:  Recent Labs  08/31/14 2058  WBC 7.1  HGB 9.8*  PLT 308  CREATININE 1.82*   Estimated Creatinine Clearance: 21.7 mL/min (by C-G formula based on Cr of 1.82). No results for input(s): VANCOTROUGH, VANCOPEAK, VANCORANDOM, GENTTROUGH, GENTPEAK, GENTRANDOM, TOBRATROUGH, TOBRAPEAK, TOBRARND, AMIKACINPEAK, AMIKACINTROU, AMIKACIN in the last 72 hours.   Microbiology: Recent Results (from the past 720 hour(s))  Urine culture     Status: None   Collection Time: 08/08/14  8:03 PM  Result Value Ref Range Status   Specimen Description URINE, CLEAN CATCH  Final   Special Requests NONE  Final   Culture   Final    >=100,000 COLONIES/mL ESCHERICHIA COLI ESBL-EXTENDED SPECTRUM BETA LACTAMASE-THE ORGANISM IS RESISTANT TO PENICILLINS, CEPHALOSPORINS AND AZTREONAM ACCORDING TO CLSI M100-S15 VOL.25 N01 JAN 2005. CRITICAL RESULT CALLED TO, READ BACK BY AND VERIFIED WITH: GREG MOYER ON 08/11/14 AT 0830 BY JEF    Report Status 08/11/2014 FINAL  Final   Organism ID, Bacteria ESCHERICHIA COLI  Final      Susceptibility   Escherichia coli - MIC*    AMPICILLIN >=32 RESISTANT Resistant     CEFAZOLIN >=64 RESISTANT Resistant      CEFTRIAXONE >=64 RESISTANT Resistant     CIPROFLOXACIN >=4 RESISTANT Resistant     GENTAMICIN <=1 SENSITIVE Sensitive     IMIPENEM <=0.25 SENSITIVE Sensitive     NITROFURANTOIN 128 RESISTANT Resistant     TRIMETH/SULFA >=320 RESISTANT Resistant     CEFOXITIN <=4 SENSITIVE Sensitive     Extended ESBL POSITIVE Resistant     * >=100,000 COLONIES/mL ESCHERICHIA COLI  Urine culture     Status: None   Collection Time: 08/11/14  4:17 PM  Result Value Ref Range Status   Specimen Description URINE, RANDOM  Final   Special Requests NONE  Final   Culture   Final    >=100,000 COLONIES/mL ESCHERICHIA COLI ESBL-EXTENDED SPECTRUM BETA LACTAMASE-THE ORGANISM IS RESISTANT TO PENICILLINS, CEPHALOSPORINS AND AZTREONAM ACCORDING TO CLSI M100-S15 VOL.25 N01 JAN 2005. CRITICAL RESULT CALLED TO, READ BACK BY AND VERIFIED WITH: TRACEY WATSON ON 08/13/14 AT 0805 BY JEF    Report Status 08/13/2014 FINAL  Final   Organism ID, Bacteria ESCHERICHIA COLI  Final      Susceptibility   Escherichia coli - MIC*    AMPICILLIN >=32 RESISTANT Resistant     CEFTAZIDIME 16 RESISTANT Resistant     CEFAZOLIN >=64 RESISTANT Resistant     CEFTRIAXONE >=64 RESISTANT Resistant     CIPROFLOXACIN >=4 RESISTANT Resistant     GENTAMICIN <=1 SENSITIVE Sensitive     IMIPENEM <=0.25 SENSITIVE Sensitive     TRIMETH/SULFA >=320 RESISTANT Resistant     CEFOXITIN <=4 SENSITIVE Sensitive  Extended ESBL POSITIVE Resistant     * >=100,000 COLONIES/mL ESCHERICHIA COLI    Medical History: Past Medical History  Diagnosis Date  . DM2 (diabetes mellitus, type 2)     onset age 81 insulin dependent  . Hyperlipidemia   . COPD (chronic obstructive pulmonary disease)   . Peripheral vascular disease   . CAD (coronary artery disease)     a. MI 1988 s/p CABG in 1988, multiple PCI on SVGs; b. cath 2012: occluded native coronary arteries, patent LIMA to LAD (distal LAD dz), patent SVG-OM & occluded SVG-RCA which filled via left to right  collats; c. cath 12/2013 patent LIMA-LAD & SVG-O. known occluded VG-RCA w/ L-R collats, no change since cath 2012  . Chronic systolic CHF (congestive heart failure)     a. 03/2012: EF 40-45%, mild lateral wall HK, mod inf HK, mod post wall HK, mild LVH, mild MR/TR   . Paroxysmal atrial fibrillation     a. CHADSVASc 7: yearly risk of CVA 9.6%;. b. on Eliquis 2.5 mg bid (age, SCr, borderline wt 62.9 Kg)   . HTN (hypertension)   . Yeast infection   . UTI (urinary tract infection)   . Atherosclerosis of native artery of left lower extremity   . Myocardial infarction     Medications:  Scheduled:  . amiodarone  200 mg Oral Daily  . apixaban  2.5 mg Oral BID  . aztreonam  1 g Intravenous 3 times per day  . aztreonam  2 g Intravenous Once  . benazepril  20 mg Oral Daily  . benazepril  20 mg Oral Daily  . carvedilol  3.125 mg Oral BID  . gabapentin  400 mg Oral TID  . insulin aspart  0-5 Units Subcutaneous QHS  . insulin aspart  0-9 Units Subcutaneous TID WC  . insulin glargine  30 Units Subcutaneous QHS  . isosorbide mononitrate  90 mg Oral Daily  . levothyroxine  100 mcg Oral QAC breakfast  . nystatin cream  1 application Topical BID  . pravastatin  40 mg Oral Daily  . sodium chloride  3 mL Intravenous Q12H  . torsemide  20 mg Oral BID  . vancomycin  500 mg Intravenous Q24H   Assessment: Patient is an 81 yo female admitted for treatment of HCAP.  Patient empirically started on IV Vancomycin and Aztreonam.   Goal of Therapy:  Vancomycin trough level 15-20 mcg/ml  Plan:  Patient received Vancomycin 1 gm IV once in ED at 2232.  Will start patient on Vancomycin 500 mg IV q24h with no stacked dose based on calculations.  Will order trough prior to 4th dose on 7/17 at 2200 (not at steady state).  Will start patient on Aztreonam 1 gm IV q8h (renally adjusted from 2 gm IV q8h) based on indication.  Measure antibiotic drug levels at steady state Follow up culture results  Clarisa Schoolsrystal  Merly Hinkson, PharmD Clinical Pharmacist 09/01/2014

## 2014-09-01 NOTE — Progress Notes (Signed)
[facility] - [facility]   PATIENT NAME: [name]    MR#:  [id number]  DATE OF BIRTH:  Nov 06, 1931  SUBJECTIVE:  CHIEF COMPLAINT:   Chief Complaint  Patient presents with  . Shortness of Breath   Sleepy this morning. Denies chest pain shortness of breath dysuria abdominal pain.  REVIEW OF SYSTEMS:   Review of Systems  Constitutional: Negative for fever.  Respiratory: Negative for shortness of breath.   Cardiovascular: Negative for chest pain and palpitations.  Gastrointestinal: Negative for nausea, vomiting and abdominal pain.  Genitourinary: Negative for dysuria.   DRUG ALLERGIES:   Allergies  Allergen Reactions  . Contrast Media [Iodinated Diagnostic Agents] Shortness Of Breath  . Tramadol Nausea Only  . Valium [Diazepam] Other (See Comments)    Reaction:  Elevated heart rate  . Iodine Strong [Iodine] Rash  . Penicillins Rash    VITALS:  Blood pressure 132/64, pulse 70, temperature 97.5 F (36.4 C), temperature source Oral, resp. rate 18, height  (1.549 m), weight 67.722 kg (149 lb 4.8 oz), SpO2 90 %.  PHYSICAL EXAMINATION:  GENERAL:  81 y.o.-year-old patient lying in the bed with no acute distress. Sleepy EYES: Pupils equal, round, reactive to light and accommodation. No scleral icterus. Extraocular muscles intact.  HEENT: Head atraumatic, normocephalic. Oropharynx and nasopharynx clear. His membranes are dry NECK:  Supple, no jugular venous distention. No thyroid enlargement, no tenderness.  LUNGS: Normal breath sounds bilaterally, no wheezing, rales,rhonchi or crepitation. No use of accessory muscles of respiration.  CARDIOVASCULAR: S1, S2 normal. 3/6 systolic ejection murmurs, no rubs, or gallops.  ABDOMEN: Soft, nontender, nondistended. Bowel sounds present. No organomegaly or mass.  EXTREMITIES: +2 pedal edema, cyanosis, or clubbing.  NEUROLOGIC: Cranial nerves II through XII are intact. Muscle strength 5/5 in  all extremities. Sensation intact. Gait not checked.  PSYCHIATRIC: The patient is very sleepy today  SKIN: No obvious rash, lesion, or ulcer.   LABORATORY PANEL:   CBC  Recent Labs Lab 09/01/14 0509  WBC 6.4  HGB 8.6*  HCT 27.8*  PLT 232   ------------------------------------------------------------------------------------------------------------------  Chemistries   Recent Labs Lab 09/01/14 0509  NA 139  K 4.5  CL 103  CO2 25  GLUCOSE 224*  BUN 34*  CREATININE 1.92*  CALCIUM 8.5*   ------------------------------------------------------------------------------------------------------------------  Cardiac Enzymes  Recent Labs Lab 09/01/14 1221  TROPONINI 1.74*   ------------------------------------------------------------------------------------------------------------------  RADIOLOGY:  Dg Chest 2 View  08/31/2014   CLINICAL DATA:  Shortness of Breath  EXAM: CHEST  2 VIEW  COMPARISON:  August 13, 2014  FINDINGS: There is focal left base infiltrate, obscuring the left hemidiaphragm posteriorly. Lungs elsewhere clear. Heart is mildly enlarged with pulmonary vascularity within normal limits. There is atherosclerotic change in aorta. Patient is status post coronary artery bypass grafting. There is evidence of an old healed fracture of the proximal humeral metaphysis. Bones are diffusely osteoporotic.  IMPRESSION: Focally of infiltrate left base. Lungs otherwise clear. Atherosclerotic calcification present. Heart mildly enlarged.  Followup PA and lateral chest radiographs recommended in 3-4 weeks following trial of antibiotic therapy to ensure resolution and exclude underlying malignancy.   Electronically Signed   By: Bretta Bang III M.D.   On: 08/31/2014 21:36    EKG:   Orders placed or performed during the hospital encounter of 08/31/14  . ED EKG within 10 minutes  . ED EKG within 10 minutes    ASSESSMENT AND PLAN:   Problem #1 ESBL UTI: Lab results  and  culture reports from primary care office available today indicate that she is once again growing an ESBL Escherichia coli. Will start meropenem.  Problem #2 acute on chronic systolic heart failure: Ejection fraction 50%. Agree with cardiology that her presentation is consistent with volume overload. Agree with addition of Lasix. Appreciate cardiology consultation. Given question of possible pneumonia would repeat chest x-ray after diuresis to ensure clearance of opacities.  Problem #3 elevated troponin, coronary artery disease status post CABG: Appreciate cardiology consultation. Continue medical management including Coreg, Imdur, Pravachol. Her troponin level has been stable and is likely due to CHF exacerbation and strain.  Problem #4 paroxysmal atrial fibrillation: Currently in normal sinus rhythm. Continue Eliquis and amiodarone.  Problem #5 diabetes mellitus: Hemoglobin A1c 7.1. Blood sugars and patient have been elevated. Changed to carbohydrate modified diet, continue Lantus and sliding scale.  All the records are reviewed and case discussed with Care Management/Social Workerr. Management plans discussed with the patient, family and they are in agreement.  CODE STATUS: Full  TOTAL TIME TAKING CARE OF THIS PATIENT: 35 minutes.  Greater than 50% of time spent in care coordination and counseling. POSSIBLE D/C IN 2-3  DAYS, DEPENDING ON CLINICAL CONDITION.   Elby ShowersWALSH, Tim Wilhide M.D on 09/01/2014 at 1:54 PM  Between 7am to 6pm - Pager - [phone]  After 6pm go to [email or web address] - password [email or web address] [facility]  [facility]  Office  [phone]  CC: Primary care physician; Fidel LevyJames Hawkins Jr, MD

## 2014-09-01 NOTE — Progress Notes (Signed)
Garnette CzechBrittany Schaffer assisted with skin verification on amission

## 2014-09-01 NOTE — Progress Notes (Signed)
Received call from PCP that urine culture taken Wednesday came back positive for ESBL. Per PCP, susceptibility results are in EPIC. Dr. Clent RidgesWalsh paged and notified.

## 2014-09-01 NOTE — Progress Notes (Signed)
Inpatient Diabetes Program Recommendations  AACE/ADA: New Consensus Statement on Inpatient Glycemic Control (2013)  Target Ranges:  Prepandial:   less than 140 mg/dL      Peak postprandial:   less than 180 mg/dL (1-2 hours)      Critically ill patients:  140 - 180 mg/dL   Results for [name], [name] (MRN [id number]) as of 09/01/2014 09:55  Ref. Range 08/31/2014 23:59 09/01/2014 07:40  Glucose-Capillary Latest Ref Range: 65-99 mg/dL 147276 (H) 829210 (H)   Results for [name], [name] (MRN [id number]) as of 09/01/2014 09:55  Ref. Range 08/30/2014 13:21  Hemoglobin A1C Unknown 7.1    Reason for assessment: elevated CBG  Diabetes history: Type 2 diabetes Outpatient Diabetes medications: Lantus 30 units qhs, Novolog 10 units tid with meals Current orders for Inpatient glycemic control:  Lantus 30 units qhs, Novolog 0-9 units tid with meals, 0-5 units qhs  Please consider adding mealtime Novolog insulin 6 units tid with meals (in addition to the Novolog correction insulin already ordered)   Susette RacerJulie Gabor Lusk, RN, BA, MHA, CDE Diabetes Coordinator Inpatient Diabetes Program  [phone] (Team Pager) [phone] [facility] Office) 09/01/2014 9:57 AM

## 2014-09-01 NOTE — Care Management (Signed)
Patient is readmission from home shortness of breath and found to pneumonia.  [facility]  She has recently completed a one week course of home intramuscular injections of Invanz for complicated UTI.  Notified agency of admission

## 2014-09-01 NOTE — Telephone Encounter (Signed)
[facility]. Called to speak to hospitalist on call regarding patient urine culture results. Was transferred to RN, Maddie on 2a who page the hospitalist for me. Verbal report with read back was given regarding patient E. Coli positive urine culture that is only susceptible to ertapenem, imipenem, or gentamicin. Results are in [facility]EPIC labs and available for viewing by staff and MD.  Per Maddie, she will page the covering physician to ensure he is aware of the results.

## 2014-09-01 NOTE — Progress Notes (Signed)
ANTIBIOTIC CONSULT NOTE - INITIAL  Pharmacy Consult for meropenem Indication: ESBL  Allergies  Allergen Reactions  . Contrast Media [Iodinated Diagnostic Agents] Shortness Of Breath  . Tramadol Nausea Only  . Valium [Diazepam] Other (See Comments)    Reaction:  Elevated heart rate  . Iodine Strong [Iodine] Rash  . Penicillins Rash    Patient Measurements: Height:  (154.9 cm) Weight: 149 lb 4.8 oz (67.722 kg) IBW/kg (Calculated) : 47.8 Adjusted Body Weight:   Vital Signs: Temp: 97.5 F (36.4 C) (07/15 0451) Temp Source: Oral (07/15 0451) BP: 132/64 mmHg (07/15 0451) Pulse Rate: 70 (07/15 0451) Intake/Output from previous day: 07/14 0701 - 07/15 0700 In: 53 [I.V.:3; IV Piggyback:50] Out: 0  Intake/Output from this shift:    Labs:  Recent Labs  08/31/14 2058 09/01/14 0509  WBC 7.1 6.4  HGB 9.8* 8.6*  PLT 308 232  CREATININE 1.82* 1.92*   Estimated Creatinine Clearance: 20.6 mL/min (by C-G formula based on Cr of 1.92). No results for input(s): VANCOTROUGH, VANCOPEAK, VANCORANDOM, GENTTROUGH, GENTPEAK, GENTRANDOM, TOBRATROUGH, TOBRAPEAK, TOBRARND, AMIKACINPEAK, AMIKACINTROU, AMIKACIN in the last 72 hours.   Microbiology: Recent Results (from the past 720 hour(s))  Urine culture     Status: None   Collection Time: 08/08/14  8:03 PM  Result Value Ref Range Status   Specimen Description URINE, CLEAN CATCH  Final   Special Requests NONE  Final   Culture   Final    >=100,000 COLONIES/mL ESCHERICHIA COLI ESBL-EXTENDED SPECTRUM BETA LACTAMASE-THE ORGANISM IS RESISTANT TO PENICILLINS, CEPHALOSPORINS AND AZTREONAM ACCORDING TO CLSI M100-S15 VOL.25 N01 JAN 2005. CRITICAL RESULT CALLED TO, READ BACK BY AND VERIFIED WITH: GREG MOYER ON 08/11/14 AT 0830 BY JEF    Report Status 08/11/2014 FINAL  Final   Organism ID, Bacteria ESCHERICHIA COLI  Final      Susceptibility   Escherichia coli - MIC*    AMPICILLIN >=32 RESISTANT Resistant     CEFAZOLIN >=64 RESISTANT  Resistant     CEFTRIAXONE >=64 RESISTANT Resistant     CIPROFLOXACIN >=4 RESISTANT Resistant     GENTAMICIN <=1 SENSITIVE Sensitive     IMIPENEM <=0.25 SENSITIVE Sensitive     NITROFURANTOIN 128 RESISTANT Resistant     TRIMETH/SULFA >=320 RESISTANT Resistant     CEFOXITIN <=4 SENSITIVE Sensitive     Extended ESBL POSITIVE Resistant     * >=100,000 COLONIES/mL ESCHERICHIA COLI  Urine culture     Status: None   Collection Time: 08/11/14  4:17 PM  Result Value Ref Range Status   Specimen Description URINE, RANDOM  Final   Special Requests NONE  Final   Culture   Final    >=100,000 COLONIES/mL ESCHERICHIA COLI ESBL-EXTENDED SPECTRUM BETA LACTAMASE-THE ORGANISM IS RESISTANT TO PENICILLINS, CEPHALOSPORINS AND AZTREONAM ACCORDING TO CLSI M100-S15 VOL.25 N01 JAN 2005. CRITICAL RESULT CALLED TO, READ BACK BY AND VERIFIED WITH: TRACEY WATSON ON 08/13/14 AT 0805 BY JEF    Report Status 08/13/2014 FINAL  Final   Organism ID, Bacteria ESCHERICHIA COLI  Final      Susceptibility   Escherichia coli - MIC*    AMPICILLIN >=32 RESISTANT Resistant     CEFTAZIDIME 16 RESISTANT Resistant     CEFAZOLIN >=64 RESISTANT Resistant     CEFTRIAXONE >=64 RESISTANT Resistant     CIPROFLOXACIN >=4 RESISTANT Resistant     GENTAMICIN <=1 SENSITIVE Sensitive     IMIPENEM <=0.25 SENSITIVE Sensitive     TRIMETH/SULFA >=320 RESISTANT Resistant     CEFOXITIN <=4 SENSITIVE  Sensitive     Extended ESBL POSITIVE Resistant     * >=100,000 COLONIES/mL ESCHERICHIA COLI  Urine Culture     Status: Abnormal   Collection Time: 08/30/14 12:00 AM  Result Value Ref Range Status   Urine Culture, Routine Final report (A)  Final   Result 1 Escherichia coli (A)  Final    Comment: Greater than 100,000 colony forming units per mL   ANTIMICROBIAL SUSCEPTIBILITY Comment  Final    Comment:       ** S = Susceptible; I = Intermediate; R = Resistant **                    P = Positive; N = Negative             MICS are expressed in  micrograms per mL    Antibiotic                 RSLT#1    RSLT#2    RSLT#3    RSLT#4 Amoxicillin/Clavulanic Acid    R Ampicillin                     R Cefazolin                      R Cefepime                       R Ceftriaxone                    R Cefuroxime                     R Cephalothin                    R Ciprofloxacin                  R Ertapenem                      S Gentamicin                     S Imipenem                       S Levofloxacin                   R Nitrofurantoin                 R Piperacillin                   R Tetracycline                   R Tobramycin                     R Trimethoprim/Sulfa             R   Culture, blood (routine x 2)     Status: None (Preliminary result)   Collection Time: 08/31/14 10:35 PM  Result Value Ref Range Status   Specimen Description BLOOD LEFT ASSIST CONTROL  Final   Special Requests BOTTLES DRAWN AEROBIC AND ANAEROBIC 5ML  Final   Culture NO GROWTH < 12 HOURS  Final   Report Status PENDING  Incomplete    Medical History: Past Medical History  Diagnosis Date  . DM2 (diabetes  mellitus, type 2)     onset age 62 insulin dependent  . Hyperlipidemia   . COPD (chronic obstructive pulmonary disease)   . Peripheral vascular disease   . CAD (coronary artery disease)     a. MI 1988 s/p CABG in 1988, multiple PCI on SVGs; b. cath 2012: occluded native coronary arteries, patent LIMA to LAD (distal LAD dz), patent SVG-OM & occluded SVG-RCA which filled via left to right collats; c. cath 12/2013 patent LIMA-LAD & SVG-O. known occluded VG-RCA w/ L-R collats, no change since cath 2012  . Chronic systolic CHF (congestive heart failure)     a. 03/2012: EF 40-45%, mild lateral wall HK, mod inf HK, mod post wall HK, mild LVH, mild MR/TR   . Paroxysmal atrial fibrillation     a. CHADSVASc 7: yearly risk of CVA 9.6%;. b. on Eliquis 2.5 mg bid (age, SCr, borderline wt 62.9 Kg)   . HTN (hypertension)   . Yeast infection   . UTI  (urinary tract infection)   . Atherosclerosis of native artery of left lower extremity   . Myocardial infarction     Medications:  Scheduled:  . amiodarone  200 mg Oral Daily  . apixaban  2.5 mg Oral BID  . benazepril  20 mg Oral Daily  . benazepril  20 mg Oral Daily  . carvedilol  3.125 mg Oral BID  . furosemide  60 mg Intravenous BID  . gabapentin  400 mg Oral TID  . insulin aspart  0-5 Units Subcutaneous QHS  . insulin aspart  0-9 Units Subcutaneous TID WC  . insulin glargine  30 Units Subcutaneous QHS  . isosorbide mononitrate  90 mg Oral Daily  . levothyroxine  100 mcg Oral QAC breakfast  . nystatin cream  1 application Topical BID  . pravastatin  40 mg Oral Daily  . sodium chloride  3 mL Intravenous Q12H  . vancomycin  500 mg Intravenous Q24H   Assessment: CrCl 20.54ml/min   Goal of Therapy:  resolution of infection  Plan:  Meropenem 500mg  q 12h Follow up culture results  Olene Floss, Pharm.D Clinical Pharmacist  09/01/2014,2:08 PM

## 2014-09-02 ENCOUNTER — Inpatient Hospital Stay: Payer: Medicare Other

## 2014-09-02 LAB — GLUCOSE, CAPILLARY
GLUCOSE-CAPILLARY: 208 mg/dL — AB (ref 65–99)
Glucose-Capillary: 213 mg/dL — ABNORMAL HIGH (ref 65–99)
Glucose-Capillary: 217 mg/dL — ABNORMAL HIGH (ref 65–99)
Glucose-Capillary: 150 mg/dL — ABNORMAL HIGH (ref 65–99)

## 2014-09-02 MED ORDER — FUROSEMIDE 10 MG/ML IJ SOLN
60.0000 mg | Freq: Every day | INTRAMUSCULAR | Status: DC
  Administered 2014-09-02: 60 mg via INTRAVENOUS

## 2014-09-02 NOTE — Progress Notes (Addendum)
VSS. Iso for ESBL. 2 L of oxygen. Huston FoleyBrady. Pt has not reported any pain. FS are stable. Takes meds ok. Foam on butt. Incontinent. Clear speech.Pt has no further concerns at this time.

## 2014-09-02 NOTE — Progress Notes (Signed)
Subjective:  Breathing better No chest pain Concerned about having transfusion   Objective:  Filed Vitals:   09/01/14 1952 09/01/14 2132 09/02/14 0345 09/02/14 0733  BP: 113/51  103/47 109/42  Pulse: 55 62 58 63  Temp: 97.3 F (36.3 C)  98.2 F (36.8 C)   TempSrc: Oral  Oral   Resp: 18  16 18   Height:      Weight:      SpO2: 100%  100% 99%    Intake/Output from previous day:  Intake/Output Summary (Last 24 hours) at 09/02/14 0934 Last data filed at 09/02/14 0657  Gross per 24 hour  Intake    443 ml  Output      0 ml  Net    443 ml    Physical Exam: Affect appropriate Chronically ill elderly female  HEENT: normal Neck supple with no adenopathy JVP normal no bruits no thyromegaly Lungs Rhonchi at base  Heart:  S1/S2 SEM murmur, no rub, gallop or click PMI normal Abdomen: benighn, BS positve, no tenderness, no AAA no bruit.  No HSM or HJR Distal pulses intact with no bruits No edema Neuro non-focal Skin warm and dry No muscular weakness   Lab Results: Basic Metabolic Panel:  Recent Labs  16/11/9605/14/16 2058 09/01/14 0509  NA 141 139  K 4.8 4.5  CL 101 103  CO2 23 25  GLUCOSE 244* 224*  BUN 32* 34*  CREATININE 1.82* 1.92*  CALCIUM 9.0 8.5*   Liver Function Tests: No results for input(s): AST, ALT, ALKPHOS, BILITOT, PROT, ALBUMIN in the last 72 hours. No results for input(s): LIPASE, AMYLASE in the last 72 hours. CBC:  Recent Labs  08/31/14 2058 09/01/14 0509  WBC 7.1 6.4  HGB 9.8* 8.6*  HCT 32.0* 27.8*  MCV 82.7 82.5  PLT 308 232   Cardiac Enzymes:  Recent Labs  09/01/14 0011 09/01/14 0509 09/01/14 1221  TROPONINI 0.59* 0.77* 1.74*   BNP: Invalid input(s): POCBNP D-Dimer: No results for input(s): DDIMER in the last 72 hours. Hemoglobin A1C:  Recent Labs  08/30/14 1321  HGBA1C 7.1    Imaging: Dg Chest 2 View  08/31/2014   CLINICAL DATA:  Shortness of Breath  EXAM: CHEST  2 VIEW  COMPARISON:  August 13, 2014  FINDINGS: There  is focal left base infiltrate, obscuring the left hemidiaphragm posteriorly. Lungs elsewhere clear. Heart is mildly enlarged with pulmonary vascularity within normal limits. There is atherosclerotic change in aorta. Patient is status post coronary artery bypass grafting. There is evidence of an old healed fracture of the proximal humeral metaphysis. Bones are diffusely osteoporotic.  IMPRESSION: Focally of infiltrate left base. Lungs otherwise clear. Atherosclerotic calcification present. Heart mildly enlarged.  Followup PA and lateral chest radiographs recommended in 3-4 weeks following trial of antibiotic therapy to ensure resolution and exclude underlying malignancy.   Electronically Signed   By: Bretta BangWilliam  Woodruff III M.D.   On: 08/31/2014 21:36   Dg Chest Port 1 View  09/02/2014   CLINICAL DATA:  Hypoxia.  Followup exam.  EXAM: PORTABLE CHEST - 1 VIEW  COMPARISON:  08/31/2014.  FINDINGS: Left lung base opacity is similar to the prior study. There is hazy type opacity in the right mid and lower lung which may be from a combination of pleural fluid and the overlying soft tissues. There is decreased interstitial thickening compared to the previous exam. No pneumothorax.  Changes from CABG surgery are stable. Cardiopericardial silhouette is mildly enlarged. No mediastinal or hilar  masses  IMPRESSION: 1. Mild improvement prior study with decreased interstitial thickening suggesting improved congestive heart failure. 2. Persistent left lung base opacity which may reflect pneumonia. Atelectasis is possible.   Electronically Signed   By: Amie Portland M.D.   On: 09/02/2014 08:01    Cardiac Studies:  ECG:  Orders placed or performed during the hospital encounter of 08/31/14  . ED EKG within 10 minutes  . ED EKG within 10 minutes     Telemetry:  NSR 09/02/2014    Echo:   Medications:   . amiodarone  200 mg Oral Daily  . apixaban  2.5 mg Oral BID  . benazepril  20 mg Oral Daily  . benazepril  20 mg  Oral Daily  . carvedilol  3.125 mg Oral BID  . furosemide  60 mg Intravenous BID  . gabapentin  400 mg Oral TID  . insulin aspart  0-5 Units Subcutaneous QHS  . insulin aspart  0-9 Units Subcutaneous TID WC  . insulin glargine  30 Units Subcutaneous QHS  . isosorbide mononitrate  90 mg Oral Daily  . levothyroxine  100 mcg Oral QAC breakfast  . meropenem (MERREM) IV  500 mg Intravenous Q12H  . nystatin cream  1 application Topical BID  . pravastatin  40 mg Oral Daily  . sodium chloride  3 mL Intravenous Q12H  . vancomycin  500 mg Intravenous Q24H       Assessment/Plan:  PAF:  maint NSR contineu low dose amiodarone and apixaban CHF: Diastolic CXR with minor change given concomitant pneumonia will decrease lasix to once daily in am Pneumonia:  Antibiotics per primary service left lung opacity persisting on CXR CAD:  No angina distant CABG 89  Slight bump in troponin not significant Anemia:  Transfusion per primary service guaiac stools   Charlton Haws 09/02/2014, 9:34 AM

## 2014-09-02 NOTE — Progress Notes (Signed)
MD Hipolito BayleySaiani was made aware of low BP, order to hold some BP meds

## 2014-09-02 NOTE — Progress Notes (Signed)
[facility] [place] [facility]   PATIENT NAME: [name]    MR#:  [id number]  DATE OF BIRTH:  1931/05/13  SUBJECTIVE:  CHIEF COMPLAINT:   Chief Complaint  Patient presents with  . Shortness of Breath   still feels somewhat short of breath, weak. Still has a cough which is nonproductive. Denies any fevers or chills, nausea vomiting.  REVIEW OF SYSTEMS:    Review of Systems  Constitutional: Negative for fever and chills.  HENT: Negative for congestion and tinnitus.   Eyes: Negative for blurred vision and double vision.  Respiratory: Positive for cough and shortness of breath. Negative for sputum production and wheezing.   Cardiovascular: Negative for chest pain, orthopnea and PND.  Gastrointestinal: Negative for nausea, vomiting, abdominal pain and diarrhea.  Genitourinary: Negative for dysuria and hematuria.  Neurological: Negative for dizziness, sensory change and focal weakness.  All other systems reviewed and are negative.   Nutrition: Heart healthy Tolerating Diet: Yes   DRUG ALLERGIES:   Allergies  Allergen Reactions  . Contrast Media [Iodinated Diagnostic Agents] Shortness Of Breath  . Tramadol Nausea Only  . Valium [Diazepam] Other (See Comments)    Reaction:  Elevated heart rate  . Iodine Strong [Iodine] Rash  . Penicillins Rash    VITALS:  Blood pressure 109/42, pulse 63, temperature 98.2 F (36.8 C), temperature source Oral, resp. rate 18, height 5\' 1"  (1.549 m), weight 67.722 kg (149 lb 4.8 oz), SpO2 99 %.  PHYSICAL EXAMINATION:   Physical Exam  GENERAL:  81 y.o.-year-old patient lying in the bed with no acute distress.  EYES: Pupils equal, round, reactive to light and accommodation. No scleral icterus. Extraocular muscles intact.  HEENT: Head atraumatic, normocephalic. Oropharynx and nasopharynx clear.  NECK:  Supple, no jugular venous distention. No thyroid enlargement, no tenderness.  LUNGS: Normal breath sounds  bilaterally, no wheezing, rales, rhonchi. No use of accessory muscles of respiration.  CARDIOVASCULAR: S1, S2 RRR. No murmurs, rubs, or gallops.  ABDOMEN: Soft, nontender, nondistended. Bowel sounds present. No organomegaly or mass.  EXTREMITIES: No cyanosis, clubbing or edema b/l.    NEUROLOGIC: Cranial nerves II through XII are intact. No focal Motor or sensory deficits b/l.  Globally weak & bedbound PSYCHIATRIC: The patient is alert and oriented x 3.  SKIN: No obvious rash, lesion.  Pressure ulcer on right foot (heel).    LABORATORY PANEL:   CBC  Recent Labs Lab 09/01/14 0509  WBC 6.4  HGB 8.6*  HCT 27.8*  PLT 232   ------------------------------------------------------------------------------------------------------------------  Chemistries   Recent Labs Lab 09/01/14 0509  NA 139  K 4.5  CL 103  CO2 25  GLUCOSE 224*  BUN 34*  CREATININE 1.92*  CALCIUM 8.5*   ------------------------------------------------------------------------------------------------------------------  Cardiac Enzymes  Recent Labs Lab 09/01/14 1221  TROPONINI 1.74*   ------------------------------------------------------------------------------------------------------------------  RADIOLOGY:  Dg Chest 2 View  08/31/2014   CLINICAL DATA:  Shortness of Breath  EXAM: CHEST  2 VIEW  COMPARISON:  August 13, 2014  FINDINGS: There is focal left base infiltrate, obscuring the left hemidiaphragm posteriorly. Lungs elsewhere clear. Heart is mildly enlarged with pulmonary vascularity within normal limits. There is atherosclerotic change in aorta. Patient is status post coronary artery bypass grafting. There is evidence of an old healed fracture of the proximal humeral metaphysis. Bones are diffusely osteoporotic.  IMPRESSION: Focally of infiltrate left base. Lungs otherwise clear. Atherosclerotic calcification present. Heart mildly enlarged.  Followup PA and lateral chest radiographs recommended in 3-4  weeks following trial of antibiotic therapy to ensure resolution and exclude underlying malignancy.   Electronically Signed   By: Bretta Bang III M.D.   On: 08/31/2014 21:36   Dg Chest Port 1 View  09/02/2014   CLINICAL DATA:  Hypoxia.  Followup exam.  EXAM: PORTABLE CHEST - 1 VIEW  COMPARISON:  08/31/2014.  FINDINGS: Left lung base opacity is similar to the prior study. There is hazy type opacity in the right mid and lower lung which may be from a combination of pleural fluid and the overlying soft tissues. There is decreased interstitial thickening compared to the previous exam. No pneumothorax.  Changes from CABG surgery are stable. Cardiopericardial silhouette is mildly enlarged. No mediastinal or hilar masses  IMPRESSION: 1. Mild improvement prior study with decreased interstitial thickening suggesting improved congestive heart failure. 2. Persistent left lung base opacity which may reflect pneumonia. Atelectasis is possible.   Electronically Signed   By: Amie Portland M.D.   On: 09/02/2014 08:01     ASSESSMENT AND PLAN:   81 year old female with past medical history of diabetes, hyperlipidemia, COPD, peripheral vascular disease, chronic systolic CHF, chronic atrial fibrillation, history of recent ESBL UTI, who presents to the hospital due to shortness of breath and noted to be in mild CHF.   #1 acute on chronic systolic heart failure: Ejection fraction 40-45%.  -Continue diuresis with IV Lasix and patient is clinically improving. Seen by cardiology and they're recommending tapering Lasix today. -Continue Coreg, Lotensin. -Appreciate cardiology input.  #2 pneumonia-this was a suspected diagnosis given patient's chest x-ray findings on admission. -Clinically patient is afebrile with a normal white cell count and hemodynamically stable. -Continue empiric vancomycin for now. Blood cultures so far negative.  #3 elevated troponin, coronary artery disease status post CABG: Appreciate  cardiology consultation. Continue medical management including Coreg, Imdur, Pravachol. Her troponin level has been stable and is likely due to CHF exacerbation and strain.  #4 paroxysmal atrial fibrillation: Currently in normal sinus rhythm and rate controlled.  -Continue Coreg, amiodarone  -Continue Eliquis    #5 diabetes mellitus: Blood sugar stable. -Continue Lantus, sliding scale insulin.  #6 hypothyroidism-continue Synthroid.  #7 ESBL UTI-as per patient's primary care physician's office her urine is still positive for UTI and cultures positive for ESBL. Continue meropenem.  #8 hyperlipidemia-continue Pravachol.  #9 diabetic neuropathy-continue Neurontin.   All the records are reviewed and case discussed with Care Management/Social Workerr. Management plans discussed with the patient, family and they are in agreement.  CODE STATUS: Full  DVT Prophylaxis: Apixiban  TOTAL TIME TAKING CARE OF THIS PATIENT: 25 minutes.   POSSIBLE D/C IN 1-2 DAYS, DEPENDING ON CLINICAL CONDITION.   Houston Siren M.D on 09/02/2014 at 11:40 AM  Between 7am to 6pm - Pager - [phone]  After 6pm go to [email or web address] - password [email or web address] [facility]  [facility]  Office  [phone]  CC: Primary care physician; Fidel Levy, MD

## 2014-09-03 ENCOUNTER — Inpatient Hospital Stay: Payer: Medicare Other

## 2014-09-03 LAB — GLUCOSE, CAPILLARY
GLUCOSE-CAPILLARY: 131 mg/dL — AB (ref 65–99)
Glucose-Capillary: 142 mg/dL — ABNORMAL HIGH (ref 65–99)
Glucose-Capillary: 144 mg/dL — ABNORMAL HIGH (ref 65–99)
Glucose-Capillary: 128 mg/dL — ABNORMAL HIGH (ref 65–99)

## 2014-09-03 LAB — BASIC METABOLIC PANEL
Anion gap: 8 (ref 5–15)
BUN: 44 mg/dL — ABNORMAL HIGH (ref 6–20)
CALCIUM: 8.3 mg/dL — AB (ref 8.9–10.3)
CO2: 27 mmol/L (ref 22–32)
Chloride: 105 mmol/L (ref 101–111)
Creatinine, Ser: 2.08 mg/dL — ABNORMAL HIGH (ref 0.44–1.00)
GFR, EST AFRICAN AMERICAN: 25 mL/min — AB (ref >60)
GFR, EST NON AFRICAN AMERICAN: 21 mL/min — AB (ref >60)
GLUCOSE: 148 mg/dL — AB (ref 65–99)
POTASSIUM: 4.3 mmol/L (ref 3.5–5.1)
SODIUM: 140 mmol/L (ref 135–145)

## 2014-09-03 LAB — TYPE AND SCREEN
ABO/RH(D): O POS
ANTIBODY SCREEN: POSITIVE
Unit division: 0
Unit division: 0

## 2014-09-03 LAB — VANCOMYCIN, TROUGH: Vancomycin Tr: 15 ug/mL (ref 10–20)

## 2014-09-03 NOTE — Progress Notes (Signed)
Dressing from insertion of PICC bloody, dressing change completed with myself and Leslie Dalesoll, Charity fundraiserN.  Cleaned and new stat lock applied.

## 2014-09-03 NOTE — Progress Notes (Signed)
ANTIBIOTIC CONSULT NOTE - INITIAL  Pharmacy Consult for Vancomycin/Aztreonam Indication: HCAP  Allergies  Allergen Reactions  . Contrast Media [Iodinated Diagnostic Agents] Shortness Of Breath  . Tramadol Nausea Only  . Valium [Diazepam] Other (See Comments)    Reaction:  Elevated heart rate  . Iodine Strong [Iodine] Rash  . Penicillins Rash    Patient Measurements: Height: 5\' 1"  (154.9 cm) Weight: 149 lb 4.8 oz (67.722 kg) IBW/kg (Calculated) : 47.8 Adjusted Body Weight: 56 kg  Vital Signs: Temp: 97.8 F (36.6 C) (07/17 1930) Temp Source: Oral (07/17 1930) BP: 127/56 mmHg (07/17 1930) Pulse Rate: 60 (07/17 1930) Intake/Output from previous day: 07/16 0701 - 07/17 0700 In: 150 [IV Piggyback:150] Out: 750 [Urine:750] Intake/Output from this shift: Total I/O In: 50 [IV Piggyback:50] Out: 100 [Urine:100]  Labs:  Recent Labs  09/01/14 0509 09/03/14 0615  WBC 6.4  --   HGB 8.6*  --   PLT 232  --   CREATININE 1.92* 2.08*   Estimated Creatinine Clearance: 19 mL/min (by C-G formula based on Cr of 2.08).  Recent Labs  09/03/14 2219  [facility] 15     Microbiology: Recent Results (from the past 720 hour(s))  Urine culture     Status: None   Collection Time: 08/08/14  8:03 PM  Result Value Ref Range Status   Specimen Description URINE, CLEAN CATCH  Final   Special Requests NONE  Final   Culture   Final    >=100,000 COLONIES/mL ESCHERICHIA COLI ESBL-EXTENDED SPECTRUM BETA LACTAMASE-THE ORGANISM IS RESISTANT TO PENICILLINS, CEPHALOSPORINS AND AZTREONAM ACCORDING TO CLSI M100-S15 VOL.25 N01 JAN 2005. CRITICAL RESULT CALLED TO, READ BACK BY AND VERIFIED WITH: GREG MOYER ON 08/11/14 AT 0830 BY JEF    Report Status 08/11/2014 FINAL  Final   Organism ID, Bacteria ESCHERICHIA COLI  Final      Susceptibility   Escherichia coli - MIC*    AMPICILLIN >=32 RESISTANT Resistant     CEFAZOLIN >=64 RESISTANT Resistant     CEFTRIAXONE >=64 RESISTANT Resistant    CIPROFLOXACIN >=4 RESISTANT Resistant     GENTAMICIN <=1 SENSITIVE Sensitive     IMIPENEM <=0.25 SENSITIVE Sensitive     NITROFURANTOIN 128 RESISTANT Resistant     TRIMETH/SULFA >=320 RESISTANT Resistant     CEFOXITIN <=4 SENSITIVE Sensitive     Extended ESBL POSITIVE Resistant     * >=100,000 COLONIES/mL ESCHERICHIA COLI  Urine culture     Status: None   Collection Time: 08/11/14  4:17 PM  Result Value Ref Range Status   Specimen Description URINE, RANDOM  Final   Special Requests NONE  Final   Culture   Final    >=100,000 COLONIES/mL ESCHERICHIA COLI ESBL-EXTENDED SPECTRUM BETA LACTAMASE-THE ORGANISM IS RESISTANT TO PENICILLINS, CEPHALOSPORINS AND AZTREONAM ACCORDING TO CLSI M100-S15 VOL.25 N01 JAN 2005. CRITICAL RESULT CALLED TO, READ BACK BY AND VERIFIED WITH: TRACEY WATSON ON 08/13/14 AT 0805 BY JEF    Report Status 08/13/2014 FINAL  Final   Organism ID, Bacteria ESCHERICHIA COLI  Final      Susceptibility   Escherichia coli - MIC*    AMPICILLIN >=32 RESISTANT Resistant     CEFTAZIDIME 16 RESISTANT Resistant     CEFAZOLIN >=64 RESISTANT Resistant     CEFTRIAXONE >=64 RESISTANT Resistant     CIPROFLOXACIN >=4 RESISTANT Resistant     GENTAMICIN <=1 SENSITIVE Sensitive     IMIPENEM <=0.25 SENSITIVE Sensitive     TRIMETH/SULFA >=320 RESISTANT Resistant     CEFOXITIN <=4  SENSITIVE Sensitive     Extended ESBL POSITIVE Resistant     * >=100,000 COLONIES/mL ESCHERICHIA COLI  Urine Culture     Status: Abnormal   Collection Time: 08/30/14 12:00 AM  Result Value Ref Range Status   Urine Culture, Routine Final report (A)  Final   Result 1 Escherichia coli (A)  Final    Comment: Greater than 100,000 colony forming units per mL   ANTIMICROBIAL SUSCEPTIBILITY Comment  Final    Comment:       ** S = Susceptible; I = Intermediate; R = Resistant **                    P = Positive; N = Negative             MICS are expressed in micrograms per mL    Antibiotic                 RSLT#1     RSLT#2    RSLT#3    RSLT#4 Amoxicillin/Clavulanic Acid    R Ampicillin                     R Cefazolin                      R Cefepime                       R Ceftriaxone                    R Cefuroxime                     R Cephalothin                    R Ciprofloxacin                  R Ertapenem                      S Gentamicin                     S Imipenem                       S Levofloxacin                   R Nitrofurantoin                 R Piperacillin                   R Tetracycline                   R Tobramycin                     R Trimethoprim/Sulfa             R   Culture, blood (routine x 2)     Status: None (Preliminary result)   Collection Time: 08/31/14 10:35 PM  Result Value Ref Range Status   Specimen Description BLOOD LEFT ASSIST CONTROL  Final   Special Requests BOTTLES DRAWN AEROBIC AND ANAEROBIC 5ML  Final   Culture NO GROWTH 3 DAYS  Final   Report Status PENDING  Incomplete  Culture, blood (routine x 2)     Status: None (Preliminary result)  Collection Time: 08/31/14 10:38 PM  Result Value Ref Range Status   Specimen Description BLOOD RIGHT ARM  Final   Special Requests BOTTLES DRAWN AEROBIC AND ANAEROBIC  Final   Culture NO GROWTH 2 DAYS  Final   Report Status PENDING  Incomplete    Medical History: Past Medical History  Diagnosis Date  . DM2 (diabetes mellitus, type 2)     onset age 25 insulin dependent  . Hyperlipidemia   . COPD (chronic obstructive pulmonary disease)   . Peripheral vascular disease   . CAD (coronary artery disease)     a. MI 1988 s/p CABG in 1988, multiple PCI on SVGs; b. cath 2012: occluded native coronary arteries, patent LIMA to LAD (distal LAD dz), patent SVG-OM & occluded SVG-RCA which filled via left to right collats; c. cath 12/2013 patent LIMA-LAD & SVG-O. known occluded VG-RCA w/ L-R collats, no change since cath 2012  . Chronic systolic CHF (congestive heart failure)     a. 03/2012: EF 40-45%, mild lateral  wall HK, mod inf HK, mod post wall HK, mild LVH, mild MR/TR   . Paroxysmal atrial fibrillation     a. CHADSVASc 7: yearly risk of CVA 9.6%;. b. on Eliquis 2.5 mg bid (age, SCr, borderline wt 62.9 Kg)   . HTN (hypertension)   . Yeast infection   . UTI (urinary tract infection)   . Atherosclerosis of native artery of left lower extremity   . Myocardial infarction     Medications:  Scheduled:  . amiodarone  200 mg Oral Daily  . apixaban  2.5 mg Oral BID  . benazepril  20 mg Oral Daily  . carvedilol  3.125 mg Oral BID  . gabapentin  400 mg Oral TID  . insulin aspart  0-5 Units Subcutaneous QHS  . insulin aspart  0-9 Units Subcutaneous TID WC  . insulin glargine  30 Units Subcutaneous QHS  . isosorbide mononitrate  90 mg Oral Daily  . levothyroxine  100 mcg Oral QAC breakfast  . meropenem (MERREM) IV  500 mg Intravenous Q12H  . nystatin cream  1 application Topical BID  . pravastatin  40 mg Oral Daily  . sodium chloride  3 mL Intravenous Q12H  . vancomycin  500 mg Intravenous Q24H   Assessment: Patient is an 81 yo female admitted for treatment of HCAP.  Patient ordered Vancomycin 500 mg IV q24h and Meropenem 500 mg IV q24h.  Per MD note, continue Vancomycin for pneumonia.  Meropenem ordered for ESBL UTI.   SCr: 2.08, est CrCl~19 mL/min  Trough on 7/17 at 2219: 15  Goal of Therapy:  Vancomycin trough level 15-20 mcg/ml  Plan:  Trough level is within desired range of 15-20 mcg/ml.  Will continue current dosing of Vancomycin 500 mg IV q24h.  Will recheck a trough in 3 days prior to dose due on 7/19 to assess for any dose changes.  SCr remains elevated.  Will need to follow closely.  BMP ordered in AM.  Measure antibiotic drug levels at steady state Follow up culture results   Pharmacy will continue to follow.  Clarisa Schools, PharmD Clinical Pharmacist 09/03/2014

## 2014-09-03 NOTE — Progress Notes (Signed)
[facility]   PATIENT NAME: [name]    MR#:  [id number]  DATE OF BIRTH:  1931/03/23  SUBJECTIVE:  CHIEF COMPLAINT:   Chief Complaint  Patient presents with  . Shortness of Breath   Shortness of breath improved since admission. Still has a cough that is non-productive.  Didn't sleep well last night.    REVIEW OF SYSTEMS:    Review of Systems  Constitutional: Negative for fever and chills.  HENT: Negative for congestion and tinnitus.   Eyes: Negative for blurred vision and double vision.  Respiratory: Positive for cough and shortness of breath. Negative for sputum production and wheezing.   Cardiovascular: Negative for chest pain, orthopnea and PND.  Gastrointestinal: Negative for nausea, vomiting, abdominal pain and diarrhea.  Genitourinary: Negative for dysuria and hematuria.  Neurological: Negative for dizziness, sensory change and focal weakness.  All other systems reviewed and are negative.   Nutrition: Heart healthy Tolerating Diet: Yes   DRUG ALLERGIES:   Allergies  Allergen Reactions  . Contrast Media [Iodinated Diagnostic Agents] Shortness Of Breath  . Tramadol Nausea Only  . Valium [Diazepam] Other (See Comments)    Reaction:  Elevated heart rate  . Iodine Strong [Iodine] Rash  . Penicillins Rash    VITALS:  Blood pressure 114/49, pulse 57, temperature 98 F (36.7 C), temperature source Oral, resp. rate 18, height 5\' 1"  (1.549 m), weight 67.722 kg (149 lb 4.8 oz), SpO2 95 %.  PHYSICAL EXAMINATION:   Physical Exam  GENERAL:  81 y.o.-year-old patient lying in the bed with no acute distress.  EYES: Pupils equal, round, reactive to light and accommodation. No scleral icterus. Extraocular muscles intact.  HEENT: Head atraumatic, normocephalic. Oropharynx and nasopharynx clear.  NECK:  Supple, no jugular venous distention. No thyroid enlargement, no tenderness.  LUNGS: Normal breath sounds  bilaterally, no wheezing, rales, rhonchi. No use of accessory muscles of respiration.  CARDIOVASCULAR: S1, S2 RRR. II/VI Diastolic murmur at base, No rubs, gallops.  ABDOMEN: Soft, nontender, nondistended. Bowel sounds present. No organomegaly or mass.  EXTREMITIES: No cyanosis, clubbing or edema b/l. Pressure ulcer on right foot (heel) NEUROLOGIC: Cranial nerves II through XII are intact. No focal Motor or sensory deficits b/l.  Globally weak & bedbound PSYCHIATRIC: The patient is alert and oriented x 3. Good Affect.  SKIN: No obvious rash, lesion.  Pressure ulcer on right foot (heel).    LABORATORY PANEL:   CBC  Recent Labs Lab 09/01/14 0509  WBC 6.4  HGB 8.6*  HCT 27.8*  PLT 232   ------------------------------------------------------------------------------------------------------------------  Chemistries   Recent Labs Lab 09/03/14 0615  NA 140  K 4.3  CL 105  CO2 27  GLUCOSE 148*  BUN 44*  CREATININE 2.08*  CALCIUM 8.3*   ------------------------------------------------------------------------------------------------------------------  Cardiac Enzymes  Recent Labs Lab 09/01/14 1221  TROPONINI 1.74*   ------------------------------------------------------------------------------------------------------------------  RADIOLOGY:  Dg Chest Port 1 View  09/02/2014   CLINICAL DATA:  Hypoxia.  Followup exam.  EXAM: PORTABLE CHEST - 1 VIEW  COMPARISON:  08/31/2014.  FINDINGS: Left lung base opacity is similar to the prior study. There is hazy type opacity in the right mid and lower lung which may be from a combination of pleural fluid and the overlying soft tissues. There is decreased interstitial thickening compared to the previous exam. No pneumothorax.  Changes from CABG surgery are stable. Cardiopericardial silhouette is mildly enlarged. No mediastinal or hilar masses  IMPRESSION: 1. Mild improvement prior study  with decreased interstitial thickening suggesting  improved congestive heart failure. 2. Persistent left lung base opacity which may reflect pneumonia. Atelectasis is possible.   Electronically Signed   By: Amie Portland M.D.   On: 09/02/2014 08:01     ASSESSMENT AND PLAN:   81 year old female with past medical history of diabetes, hyperlipidemia, COPD, peripheral vascular disease, chronic systolic CHF, chronic atrial fibrillation, history of recent ESBL UTI, who presents to the hospital due to shortness of breath and noted to be in mild CHF.   #1 acute on chronic systolic heart failure: Ejection fraction 40-45%.  -Creatinine is rising now. We'll DC Lasix. We'll get repeat chest x-ray today. -Continue Coreg, Lotensin. -Appreciate cardiology input.  #2 pneumonia-this was a suspected diagnosis given patient's chest x-ray findings on admission. -Clinically patient is afebrile with a normal white cell count and hemodynamically stable. -Continue empiric vancomycin for now. Blood cultures so far negative. -We'll get repeat chest x-ray today.  #3 elevated troponin, coronary artery disease status post CABG: Appreciate cardiology consultation. Continue medical management including Coreg, Imdur, Pravachol. Her troponin level has been stable and is likely due to CHF exacerbation and strain.  #4 paroxysmal atrial fibrillation: Currently in normal sinus rhythm and rate controlled.  -Continue Coreg, amiodarone  -Continue Eliquis    #5 diabetes mellitus: Blood sugar stable. -Continue Lantus, sliding scale insulin.  #6 hypothyroidism-continue Synthroid.  #7 ESBL UTI-as per patient's primary care physician's office her urine is still positive for UTI and cultures positive for ESBL.  -We'll get PICC lines placed for treatment for UTI with IV meropenem  #8 hyperlipidemia-continue Pravachol.  #9 diabetic neuropathy-continue Neurontin.  Likely will need home health services prior to discharge.   All the records are reviewed and case discussed with  Care Management/Social Workerr. Management plans discussed with the patient, family and they are in agreement.  CODE STATUS: Full  DVT Prophylaxis: Apixiban  TOTAL TIME TAKING CARE OF THIS PATIENT: 25 minutes.   POSSIBLE D/C IN 1-2 DAYS, DEPENDING ON CLINICAL CONDITION.   Houston Siren M.D on 09/03/2014 at 10:50 AM  Between 7am to 6pm - Pager - [phone]  After 6pm go to [email or web address] - password [email or web address] [facility]  [place] [facility]  Office  [phone]  CC: Primary care physician; Fidel Levy, MD

## 2014-09-03 NOTE — Progress Notes (Signed)
Subjective:  Denies SSCP, palpitations or Dyspnea   Objective:  Filed Vitals:   09/02/14 2235 09/03/14 0502 09/03/14 0820 09/03/14 1130  BP: 134/60 105/51 114/49 112/46  Pulse: 68 60 57 75  Temp:  98.2 F (36.8 C) 98 F (36.7 C) 97.7 F (36.5 C)  TempSrc:  Oral Oral Oral  Resp: 19 16 18 17   Height:      Weight:      SpO2: 94% 97% 95% 94%    Intake/Output from previous day:  Intake/Output Summary (Last 24 hours) at 09/03/14 1132 Last data filed at 09/03/14 0931  Gross per 24 hour  Intake    156 ml  Output    350 ml  Net   -194 ml    Physical Exam: Affect appropriate Frail elderly female  HEENT: normal Neck supple with no adenopathy JVP normal no bruits no thyromegaly Lungs clear with no wheezing and good diaphragmatic motion Heart:  S1/S2 SEM  murmur, no rub, gallop or click PMI normal Abdomen: benighn, BS positve, no tenderness, no AAA no bruit.  No HSM or HJR Distal pulses intact with no bruits No edema Neuro non-focal Skin warm and dry No muscular weakness   Lab Results: Basic Metabolic Panel:  Recent Labs  29/56/2107/15/16 0509 09/03/14 0615  NA 139 140  K 4.5 4.3  CL 103 105  CO2 25 27  GLUCOSE 224* 148*  BUN 34* 44*  CREATININE 1.92* 2.08*  CALCIUM 8.5* 8.3*   CBC:  Recent Labs  08/31/14 2058 09/01/14 0509  WBC 7.1 6.4  HGB 9.8* 8.6*  HCT 32.0* 27.8*  MCV 82.7 82.5  PLT 308 232   Cardiac Enzymes:  Recent Labs  09/01/14 0011 09/01/14 0509 09/01/14 1221  TROPONINI 0.59* 0.77* 1.74*   Thyroid Function Tests:  Imaging: Dg Chest 1 View  09/03/2014   CLINICAL DATA:  81 year old female with shortness of breath. Urinary tract infection. Congestive heart failure. Initial encounter.  EXAM: CHEST  1 VIEW  COMPARISON:  09/02/2014 and earlier.  FINDINGS: Portable AP upright view at 1059 hours. Regressed pulmonary vascular congestion since 08/31/2014. Pulmonary vascularity now appears near baseline. Stable cardiomegaly and mediastinal  contours. Sequelae of CABG. Increased retrocardiac opacity, chronic to a degree but with increased obscuration of the left hemidiaphragm from baseline. No pneumothorax. No other confluent pulmonary opacity.  IMPRESSION: 1. Regressed pulmonary vascular congestion since 08/31/2014. 2. Chronic cardiomegaly. Increased retrocardiac opacity could reflect atelectasis or pneumonia. PA and lateral radiographs may evaluate further when possible.   Electronically Signed   By: Odessa FlemingH  Hall M.D.   On: 09/03/2014 11:16   Dg Chest Port 1 View  09/02/2014   CLINICAL DATA:  Hypoxia.  Followup exam.  EXAM: PORTABLE CHEST - 1 VIEW  COMPARISON:  08/31/2014.  FINDINGS: Left lung base opacity is similar to the prior study. There is hazy type opacity in the right mid and lower lung which may be from a combination of pleural fluid and the overlying soft tissues. There is decreased interstitial thickening compared to the previous exam. No pneumothorax.  Changes from CABG surgery are stable. Cardiopericardial silhouette is mildly enlarged. No mediastinal or hilar masses  IMPRESSION: 1. Mild improvement prior study with decreased interstitial thickening suggesting improved congestive heart failure. 2. Persistent left lung base opacity which may reflect pneumonia. Atelectasis is possible.   Electronically Signed   By: Amie Portlandavid  Ormond M.D.   On: 09/02/2014 08:01    Cardiac Studies:  ECG:  SR RBBB old anterior  MI   Telemetry:  NSR 09/03/2014   Echo: 2014  EF 45-50%   Medications:   . amiodarone  200 mg Oral Daily  . apixaban  2.5 mg Oral BID  . benazepril  20 mg Oral Daily  . carvedilol  3.125 mg Oral BID  . gabapentin  400 mg Oral TID  . insulin aspart  0-5 Units Subcutaneous QHS  . insulin aspart  0-9 Units Subcutaneous TID WC  . insulin glargine  30 Units Subcutaneous QHS  . isosorbide mononitrate  90 mg Oral Daily  . levothyroxine  100 mcg Oral QAC breakfast  . meropenem (MERREM) IV  500 mg Intravenous Q12H  . nystatin  cream  1 application Topical BID  . pravastatin  40 mg Oral Daily  . sodium chloride  3 mL Intravenous Q12H  . vancomycin  500 mg Intravenous Q24H       Assessment/Plan:  PAF: maint NSR contineu low dose amiodarone and apixaban CHF: Diastolic CXR with minor change given concomitant pneumonia lasix stopped due to azotemia Pneumonia: Antibiotics per primary service left lung opacity persisting on CXR CAD: No angina distant CABG 89 Slight bump in troponin not significant Anemia: Transfusion per primary service guaiac stools   Charlton Haws 09/03/2014, 11:32 AM

## 2014-09-03 NOTE — Progress Notes (Signed)
Particia LatherElizabeth Jenkins, RN at bedside inserting PICC line

## 2014-09-04 DIAGNOSIS — J189 Pneumonia, unspecified organism: Secondary | ICD-10-CM

## 2014-09-04 LAB — BASIC METABOLIC PANEL
Anion gap: 6 (ref 5–15)
BUN: 50 mg/dL — AB (ref 6–20)
CALCIUM: 8.3 mg/dL — AB (ref 8.9–10.3)
CO2: 29 mmol/L (ref 22–32)
CREATININE: 2 mg/dL — AB (ref 0.44–1.00)
Chloride: 106 mmol/L (ref 101–111)
GFR calc Af Amer: 26 mL/min — ABNORMAL LOW (ref >60)
GFR, EST NON AFRICAN AMERICAN: 22 mL/min — AB (ref >60)
Glucose, Bld: 148 mg/dL — ABNORMAL HIGH (ref 65–99)
Potassium: 4.5 mmol/L (ref 3.5–5.1)
SODIUM: 141 mmol/L (ref 135–145)

## 2014-09-04 LAB — GLUCOSE, CAPILLARY
Glucose-Capillary: 109 mg/dL — ABNORMAL HIGH (ref 65–99)
Glucose-Capillary: 199 mg/dL — ABNORMAL HIGH (ref 65–99)
Glucose-Capillary: 163 mg/dL — ABNORMAL HIGH (ref 65–99)
Glucose-Capillary: 166 mg/dL — ABNORMAL HIGH (ref 65–99)

## 2014-09-04 LAB — HEMOGLOBIN: HEMOGLOBIN: 7.9 g/dL — AB (ref 12.0–16.0)

## 2014-09-04 MED ORDER — SODIUM CHLORIDE 0.9 % IJ SOLN
10.0000 mL | Freq: Two times a day (BID) | INTRAMUSCULAR | Status: DC
  Administered 2014-09-04: 20 mL
  Administered 2014-09-04: 10 mL
  Administered 2014-09-05: 20 mL

## 2014-09-04 MED ORDER — LEVOFLOXACIN 250 MG PO TABS: 250.0000 mg | ORAL_TABLET | Freq: Every day | ORAL | Status: DC

## 2014-09-04 MED ORDER — HEPARIN SODIUM (PORCINE) 1000 UNIT/ML IJ SOLN
INTRAMUSCULAR | Status: AC
  Filled 2014-09-04: qty 1

## 2014-09-04 MED ORDER — LEVOFLOXACIN 500 MG PO TABS
500.0000 mg | ORAL_TABLET | Freq: Every day | ORAL | Status: DC
  Administered 2014-09-04: 500 mg via ORAL
  Filled 2014-09-04: qty 1

## 2014-09-04 MED ORDER — FENTANYL CITRATE (PF) 100 MCG/2ML IJ SOLN
INTRAMUSCULAR | Status: AC
  Filled 2014-09-04: qty 2

## 2014-09-04 MED ORDER — CEFAZOLIN SODIUM 1-5 GM-% IV SOLN
INTRAVENOUS | Status: AC
  Filled 2014-09-04: qty 50

## 2014-09-04 MED ORDER — MIDAZOLAM HCL 5 MG/5ML IJ SOLN
INTRAMUSCULAR | Status: AC
  Filled 2014-09-04: qty 5

## 2014-09-04 MED ORDER — SODIUM CHLORIDE 0.9 % IJ SOLN
10.0000 mL | INTRAMUSCULAR | Status: DC | PRN
  Administered 2014-09-04: 20 mL

## 2014-09-04 NOTE — Care Management (Signed)
Patient may also benefit from home physical therapy assessment.  Chronic pain in her hip affects ambulatory status.

## 2014-09-04 NOTE — Progress Notes (Signed)
ANTIBIOTIC CONSULT NOTE  Pharmacy Consult for Meropenem Indication: HCAP/ESBL UTI  Allergies  Allergen Reactions  . Contrast Media [Iodinated Diagnostic Agents] Shortness Of Breath  . Tramadol Nausea Only  . Valium [Diazepam] Other (See Comments)    Reaction:  Elevated heart rate  . Iodine Strong [Iodine] Rash  . Penicillins Rash    Patient Measurements: Height: 5\' 1"  (154.9 cm) Weight: 149 lb 4.8 oz (67.722 kg) IBW/kg (Calculated) : 47.8 Adjusted Body Weight: 56 kg  Vital Signs: Temp: 97.7 F (36.5 C) (07/18 1232) Temp Source: Oral (07/18 1232) BP: 120/64 mmHg (07/18 1232) Pulse Rate: 61 (07/18 1232) Intake/Output from previous day: 07/17 0701 - 07/18 0700 In: 176 [I.V.:26; IV Piggyback:150] Out: 800 [Urine:800] Intake/Output from this shift: Total I/O In: -  Out: 350 [Urine:350]  Labs:  Recent Labs  09/03/14 0615 09/04/14 0428  HGB  --  7.9*  CREATININE 2.08* 2.00*   Estimated Creatinine Clearance: 19.8 mL/min (by C-G formula based on Cr of 2).  Recent Labs  09/03/14 2219  [facility] 15     Microbiology: Recent Results (from the past 720 hour(s))  Urine culture     Status: None   Collection Time: 08/08/14  8:03 PM  Result Value Ref Range Status   Specimen Description URINE, CLEAN CATCH  Final   Special Requests NONE  Final   Culture   Final    >=100,000 COLONIES/mL ESCHERICHIA COLI ESBL-EXTENDED SPECTRUM BETA LACTAMASE-THE ORGANISM IS RESISTANT TO PENICILLINS, CEPHALOSPORINS AND AZTREONAM ACCORDING TO CLSI M100-S15 VOL.25 N01 JAN 2005. CRITICAL RESULT CALLED TO, READ BACK BY AND VERIFIED WITH: GREG MOYER ON 08/11/14 AT 0830 BY JEF    Report Status 08/11/2014 FINAL  Final   Organism ID, Bacteria ESCHERICHIA COLI  Final      Susceptibility   Escherichia coli - MIC*    AMPICILLIN >=32 RESISTANT Resistant     CEFAZOLIN >=64 RESISTANT Resistant     CEFTRIAXONE >=64 RESISTANT Resistant     CIPROFLOXACIN >=4 RESISTANT Resistant     GENTAMICIN <=1  SENSITIVE Sensitive     IMIPENEM <=0.25 SENSITIVE Sensitive     NITROFURANTOIN 128 RESISTANT Resistant     TRIMETH/SULFA >=320 RESISTANT Resistant     CEFOXITIN <=4 SENSITIVE Sensitive     Extended ESBL POSITIVE Resistant     * >=100,000 COLONIES/mL ESCHERICHIA COLI  Urine culture     Status: None   Collection Time: 08/11/14  4:17 PM  Result Value Ref Range Status   Specimen Description URINE, RANDOM  Final   Special Requests NONE  Final   Culture   Final    >=100,000 COLONIES/mL ESCHERICHIA COLI ESBL-EXTENDED SPECTRUM BETA LACTAMASE-THE ORGANISM IS RESISTANT TO PENICILLINS, CEPHALOSPORINS AND AZTREONAM ACCORDING TO CLSI M100-S15 VOL.25 N01 JAN 2005. CRITICAL RESULT CALLED TO, READ BACK BY AND VERIFIED WITH: TRACEY WATSON ON 08/13/14 AT 0805 BY JEF    Report Status 08/13/2014 FINAL  Final   Organism ID, Bacteria ESCHERICHIA COLI  Final      Susceptibility   Escherichia coli - MIC*    AMPICILLIN >=32 RESISTANT Resistant     CEFTAZIDIME 16 RESISTANT Resistant     CEFAZOLIN >=64 RESISTANT Resistant     CEFTRIAXONE >=64 RESISTANT Resistant     CIPROFLOXACIN >=4 RESISTANT Resistant     GENTAMICIN <=1 SENSITIVE Sensitive     IMIPENEM <=0.25 SENSITIVE Sensitive     TRIMETH/SULFA >=320 RESISTANT Resistant     CEFOXITIN <=4 SENSITIVE Sensitive     Extended ESBL POSITIVE Resistant     * >=  100,000 COLONIES/mL ESCHERICHIA COLI  Urine Culture     Status: Abnormal   Collection Time: 08/30/14 12:00 AM  Result Value Ref Range Status   Urine Culture, Routine Final report (A)  Final   Result 1 Escherichia coli (A)  Final    Comment: Greater than 100,000 colony forming units per mL   ANTIMICROBIAL SUSCEPTIBILITY Comment  Final    Comment:       ** S = Susceptible; I = Intermediate; R = Resistant **                    P = Positive; N = Negative             MICS are expressed in micrograms per mL    Antibiotic                 RSLT#1    RSLT#2    RSLT#3    RSLT#4 Amoxicillin/Clavulanic Acid     R Ampicillin                     R Cefazolin                      R Cefepime                       R Ceftriaxone                    R Cefuroxime                     R Cephalothin                    R Ciprofloxacin                  R Ertapenem                      S Gentamicin                     S Imipenem                       S Levofloxacin                   R Nitrofurantoin                 R Piperacillin                   R Tetracycline                   R Tobramycin                     R Trimethoprim/Sulfa             R   Culture, blood (routine x 2)     Status: None (Preliminary result)   Collection Time: 08/31/14 10:35 PM  Result Value Ref Range Status   Specimen Description BLOOD LEFT ASSIST CONTROL  Final   Special Requests BOTTLES DRAWN AEROBIC AND ANAEROBIC  Final   Culture NO GROWTH 4 DAYS  Final   Report Status PENDING  Incomplete  Culture, blood (routine x 2)     Status: None (Preliminary result)   Collection Time: 08/31/14 10:38 PM  Result Value Ref Range Status   Specimen  Description BLOOD RIGHT ARM  Final   Special Requests BOTTLES DRAWN AEROBIC AND ANAEROBIC 7ML  Final   Culture NO GROWTH 3 DAYS  Final   Report Status PENDING  Incomplete    Medical History: Past Medical History  Diagnosis Date  . DM2 (diabetes mellitus, type 2)     onset age 81 insulin dependent  . Hyperlipidemia   . COPD (chronic obstructive pulmonary disease)   . Peripheral vascular disease   . CAD (coronary artery disease)     a. MI 1988 s/p CABG in 1988, multiple PCI on SVGs; b. cath 2012: occluded native coronary arteries, patent LIMA to LAD (distal LAD dz), patent SVG-OM & occluded SVG-RCA which filled via left to right collats; c. cath 12/2013 patent LIMA-LAD & SVG-O. known occluded VG-RCA w/ L-R collats, no change since cath 2012  . Chronic systolic CHF (congestive heart failure)     a. 03/2012: EF 40-45%, mild lateral wall HK, mod inf HK, mod post wall HK, mild LVH, mild MR/TR    . Paroxysmal atrial fibrillation     a. CHADSVASc 7: yearly risk of CVA 9.6%;. b. on Eliquis 2.5 mg bid (age, SCr, borderline wt 62.9 Kg)   . HTN (hypertension)   . Yeast infection   . UTI (urinary tract infection)   . Atherosclerosis of native artery of left lower extremity   . Myocardial infarction     Medications:  Scheduled:  . amiodarone  200 mg Oral Daily  . apixaban  2.5 mg Oral BID  . benazepril  20 mg Oral Daily  . carvedilol  3.125 mg Oral BID  . gabapentin  400 mg Oral TID  . insulin aspart  0-5 Units Subcutaneous QHS  . insulin aspart  0-9 Units Subcutaneous TID WC  . insulin glargine  30 Units Subcutaneous QHS  . isosorbide mononitrate  90 mg Oral Daily  . levothyroxine  100 mcg Oral QAC breakfast  . meropenem (MERREM) IV  500 mg Intravenous Q12H  . nystatin cream  1 application Topical BID  . pravastatin  40 mg Oral Daily  . sodium chloride  10-40 mL Intracatheter Q12H  . sodium chloride  3 mL Intravenous Q12H   Assessment: Patient is an [age] yo female admitted for treatment of HCAP and UTI, patient with history of ESBL UTI  Plan:  Will continue meropenem 500mg  IV Q12H to cover both HCAP and UTI. Dose appropriate for renal function, will continue to monitor renal function and adjust as indicated.  Pharmacy will continue to follow.  Garlon HatchetJody Denissa Cozart, PharmD Clinical Pharmacist 09/04/2014

## 2014-09-04 NOTE — Progress Notes (Signed)
Back to bed with assist.  Bed alarm on.

## 2014-09-04 NOTE — Care Management Important Message (Signed)
Important Message  Patient Details  Name: [name] MRN: [id number] Date of Birth: 1931-03-21   Medicare Important Message Given:  Yes-second notification given    [name], Sara [place] R, RN 09/04/2014, 12:28 PM

## 2014-09-04 NOTE — Progress Notes (Signed)
Assessment completed.  No distress on ra.  Cardiac monitor in place, pt denies chest pain.  Lungs diminished lower lobes bil.  Dressing to sacral area/ lt ankle dry and intact.  Rt upper arm double lumen picc inplace, flushes well.  Denies need at this time.  CB in reach, SR up x2, bed alarm on.

## 2014-09-04 NOTE — Care Management (Addendum)
[facility] will be able to see patient daily for 10 days for the administration of patient morning doses of IV antibiotic.  Discussed with patient the need to keep pull ups clean and changed with every void.  Referral to [facility] for the DME and IV antibiotic    MEDICAL NECESSITY FOR HOSPITAL BED  Patient has COPD, CHF and chronic respiratory failure.  This requires the head of the bed to be positioned in ways not feasible with a normal bed. Head must be elevated at least 30 degrees or patient to prevent aspiration.   A wedge will not elevate the head of the bed enough to alleviate or prevent sx.   Patient will also require a pad for her wheelchair

## 2014-09-04 NOTE — Care Management (Signed)
Patient had PICC line inserted due to need for  intravenous antibiotics for 10 more days for her ESBL  in the urine.  Will require bid dosing.  Patient would not be able to participate in the administration.  Spoke with patient's granddaughter- [name] who is a Designer, jewelleryregistered nurse.  [name] can administer the evening doses if [name] can administer the morning dose.  [facility].  Addressed concern of excoriation of bottom.  [name] says that patient is having increasing problems with pivot transfers.  She should be changing her brief but chooses not to because does not want to have to transfer to due so sue to her chronic pain in her left hip.  Patient needs joint replacement but [name] will not proceed until the wound on her left foot heals.  Patient was to have had a vascular procedure 7/13 but had to be canceled due to not having a repeat urine culture.   Discussed physical therapy/ occupational therapy consults at home.  [name] is in agreement because patient has lost her upper body strength.  If [facility] not [facility] to assist with administration of IV antibiotic for 10 consecutive days, then patient will require skilled nursing

## 2014-09-04 NOTE — Progress Notes (Signed)
OOB to chair with assist, chair alarm in place.

## 2014-09-04 NOTE — Progress Notes (Signed)
Patient: Sara DemarkShirley A Granville / Admit Date: 08/31/2014 / Date of Encounter: 09/04/2014, 9:09 AM   Subjective: No chest pain, SOB, or palpitations. Pulse ox 96% on room air this morning. SCr slightly improved to 2.00 this morning from 2.08 on 7/17. Lasix has been held for past 24 hours. Minus 728 for the admission. Repeat CXR on 7/17 showed regressed pulmonary vascular congestion since 7/14. Increased retrocardiac opacity could reflect atelectasis vs PNA. Obscured left lung base. Small left pleural effusion could not be excluded.   Review of Systems: Review of Systems  Constitutional: Positive for weight loss and malaise/fatigue. Negative for fever, chills and diaphoresis.  HENT: Negative for congestion.   Eyes: Negative for discharge and redness.  Respiratory: Positive for shortness of breath. Negative for cough, sputum production and wheezing.        SOB improved  Cardiovascular: Negative for chest pain, palpitations, orthopnea, claudication, leg swelling and PND.  Gastrointestinal: Negative for nausea, vomiting and abdominal pain.  Musculoskeletal: Negative for falls.       Bed bound  Skin: Negative for rash.  Neurological: Positive for weakness. Negative for sensory change, speech change and loss of consciousness.  Endo/Heme/Allergies: Does not bruise/bleed easily.  Psychiatric/Behavioral: The patient is not nervous/anxious.     Objective: Telemetry: NSR, 70's Physical Exam: Blood pressure 123/65, pulse 57, temperature 98 F (36.7 C), temperature source Oral, resp. rate 17, height 5\' 1"  (1.549 m), weight 149 lb 4.8 oz (67.722 kg), SpO2 98 %. Body mass index is 28.22 kg/(m^2). General: Well developed, well nourished, in no acute distress. Head: Normocephalic, atraumatic, sclera non-icteric, no xanthomas, nares are without discharge. Neck: Negative for carotid bruits. JVP not elevated. Lungs: Decreased breath sounds left base with crackles. No wheezes or rhonchi. Breathing is  unlabored. Heart: RRR S1 S2. II/VI systolic murmurs. No rubs or gallops.  Abdomen: Soft, non-tender, non-distended with normoactive bowel sounds. No rebound/guarding. Extremities: No clubbing or cyanosis. No edema. Distal pedal pulses are 2+ and equal bilaterally. Neuro: Alert and oriented X 3. Moves all extremities spontaneously. Psych:  Responds to questions appropriately with a normal affect.   Intake/Output Summary (Last 24 hours) at 09/04/14 0909 Last data filed at 09/04/14 0559  Gross per 24 hour  Intake    176 ml  Output    800 ml  Net   -624 ml    Inpatient Medications:  . amiodarone  200 mg Oral Daily  . apixaban  2.5 mg Oral BID  . benazepril  20 mg Oral Daily  . carvedilol  3.125 mg Oral BID  . gabapentin  400 mg Oral TID  . insulin aspart  0-5 Units Subcutaneous QHS  . insulin aspart  0-9 Units Subcutaneous TID WC  . insulin glargine  30 Units Subcutaneous QHS  . isosorbide mononitrate  90 mg Oral Daily  . levothyroxine  100 mcg Oral QAC breakfast  . meropenem (MERREM) IV  500 mg Intravenous Q12H  . nystatin cream  1 application Topical BID  . pravastatin  40 mg Oral Daily  . sodium chloride  10-40 mL Intracatheter Q12H  . sodium chloride  3 mL Intravenous Q12H  . vancomycin  500 mg Intravenous Q24H   Infusions:    Labs:  Recent Labs  09/03/14 0615 09/04/14 0428  NA 140 141  K 4.3 4.5  CL 105 106  CO2 27 29  GLUCOSE 148* 148*  BUN 44* 50*  CREATININE 2.08* 2.00*  CALCIUM 8.3* 8.3*   No results for  input(s): AST, ALT, ALKPHOS, BILITOT, PROT, ALBUMIN in the last 72 hours.  Recent Labs  09/04/14 0428  HGB 7.9*    Recent Labs  09/01/14 1221  TROPONINI 1.74*   Invalid input(s): POCBNP No results for input(s): HGBA1C in the last 72 hours.   Weights: Filed Weights   08/31/14 2051 09/01/14 0047  Weight: 142 lb (64.411 kg) 149 lb 4.8 oz (67.722 kg)     Radiology/Studies:  Dg Chest 1 View  09/03/2014   CLINICAL DATA:  PICC line placement   EXAM: CHEST  1 VIEW  COMPARISON:  09/03/2014 at 1057 hours  FINDINGS: Right arm PICC terminates in the lower SVC.  Cardiomegaly with pulmonary vascular congestion and possible mild interstitial edema.  Left lung base is obscured. Small left pleural effusion is not excluded. No pneumothorax.  Postsurgical changes related to prior CABG.  Suspected chronic left humeral neck fracture deformity.  IMPRESSION: Right arm PICC terminates in the lower SVC.  Otherwise unchanged.   Electronically Signed   By: Charline Bills M.D.   On: 09/03/2014 14:56   Dg Chest 1 View  09/03/2014   CLINICAL DATA:  81 year old female with shortness of breath. Urinary tract infection. Congestive heart failure. Initial encounter.  EXAM: CHEST  1 VIEW  COMPARISON:  09/02/2014 and earlier.  FINDINGS: Portable AP upright view at 1059 hours. Regressed pulmonary vascular congestion since 08/31/2014. Pulmonary vascularity now appears near baseline. Stable cardiomegaly and mediastinal contours. Sequelae of CABG. Increased retrocardiac opacity, chronic to a degree but with increased obscuration of the left hemidiaphragm from baseline. No pneumothorax. No other confluent pulmonary opacity.  IMPRESSION: 1. Regressed pulmonary vascular congestion since 08/31/2014. 2. Chronic cardiomegaly. Increased retrocardiac opacity could reflect atelectasis or pneumonia. PA and lateral radiographs may evaluate further when possible.   Electronically Signed   By: Odessa Fleming M.D.   On: 09/03/2014 11:16   Dg Chest 2 View  08/31/2014   CLINICAL DATA:  Shortness of Breath  EXAM: CHEST  2 VIEW  COMPARISON:  August 13, 2014  FINDINGS: There is focal left base infiltrate, obscuring the left hemidiaphragm posteriorly. Lungs elsewhere clear. Heart is mildly enlarged with pulmonary vascularity within normal limits. There is atherosclerotic change in aorta. Patient is status post coronary artery bypass grafting. There is evidence of an old healed fracture of the proximal  humeral metaphysis. Bones are diffusely osteoporotic.  IMPRESSION: Focally of infiltrate left base. Lungs otherwise clear. Atherosclerotic calcification present. Heart mildly enlarged.  Followup PA and lateral chest radiographs recommended in 3-4 weeks following trial of antibiotic therapy to ensure resolution and exclude underlying malignancy.   Electronically Signed   By: Bretta Bang III M.D.   On: 08/31/2014 21:36   Dg Chest Port 1 View  09/02/2014   CLINICAL DATA:  Hypoxia.  Followup exam.  EXAM: PORTABLE CHEST - 1 VIEW  COMPARISON:  08/31/2014.  FINDINGS: Left lung base opacity is similar to the prior study. There is hazy type opacity in the right mid and lower lung which may be from a combination of pleural fluid and the overlying soft tissues. There is decreased interstitial thickening compared to the previous exam. No pneumothorax.  Changes from CABG surgery are stable. Cardiopericardial silhouette is mildly enlarged. No mediastinal or hilar masses  IMPRESSION: 1. Mild improvement prior study with decreased interstitial thickening suggesting improved congestive heart failure. 2. Persistent left lung base opacity which may reflect pneumonia. Atelectasis is possible.   Electronically Signed   By: Renard Hamper.D.  On: 09/02/2014 08:01   Dg Chest Port 1 View  08/13/2014   CLINICAL DATA:  Acute onset of shortness of breath. Initial encounter.  EXAM: PORTABLE CHEST - 1 VIEW  COMPARISON:  Chest radiograph performed 06/16/2014  FINDINGS: The lungs are well-aerated. Mild vascular congestion is noted. Mild bibasilar opacities likely reflect atelectasis, though minimal interstitial edema might have a similar appearance. There is no evidence of pleural effusion or pneumothorax.  The cardiomediastinal silhouette is borderline enlarged. The patient is status post median sternotomy, with evidence of prior CABG. No acute osseous abnormalities are seen.  IMPRESSION: Mild vascular congestion and borderline  cardiomegaly. Mild bibasilar airspace opacities likely reflect atelectasis, though minimal interstitial edema might have a similar appearance.   Electronically Signed   By: Roanna Raider M.D.   On: 08/13/2014 05:33     Assessment and Plan  1. Acute on chronic systolic CHF, EF 96-04%:  -With concomitant PNA Improving CXR on 7/17 -Lasix stopped on 7/17 secondary to azotemia, restart when able (baeline SCr around 1.8 to 1.9, baseline BUN around low 30's, currently at 50)   -Continue Coreg and Lotensin  -Recommend leg exercises to PT, she reports she is not able to ambulate 2/2 hip pain and will likely fall  2. Elevated troponin/CAD s/p CABG in 1988: -Mildly elevated and generally flat trending as above -Patient with recent cardiac cath in April 2016 that was unchanged from prior cardiac cath 2012  -Elevated troponin likely supply demand ischemia in the setting of her left base pneumonia, anemia (chronic issue), and acute on chronic systolic CHF -No further ischemic evaluation at this time given the above recent cardiac catheterization  -No angina  -Continue Coreg 3.125 mg bid, Imdur 90 mg daily, pravastatin 40 mg   3. PAF: -Maintaining NSR -Continue amiodarone 200 mg daily and Eliquis 2.5 mg bid -CHADSVASc at least 7 giving her an estimated annual stroke risk of 9.6%  4. Anemia: -Maintain hgb >8 to 8.5  5. ESBL UTI: -PICC line placed for ABX -Recommend follow up urine culture to check for clearing   6. PNA: -On empiric vancomycin per IM -Possible retrocardiac opacity on repeat CXR on 7/17  7. HLD: -Continue statin  8. Hypothyroidism: -Synthroid   9. DM: -SSI per IM  10. Chronic hip pain: -Surgery has been postponed  -Unable to ambulate 2/2 increased fall risk -Recommend leg exercises with PT   Signed, Eula Listen, PA-C Pager: [phone] 09/04/2014, 9:09 AM

## 2014-09-04 NOTE — Progress Notes (Signed)
[facility] [place] [facility]   PATIENT NAME: [name]    MR#:  [id number]  DATE OF BIRTH:  [birth year]  SUBJECTIVE:  CHIEF COMPLAINT:   Chief Complaint  Patient presents with  . Shortness of Breath   S/p PICC line yesterday.  Shortness of breath improved.  + Cough.      REVIEW OF SYSTEMS:    Review of Systems  Constitutional: Negative for fever and chills.  HENT: Negative for congestion and tinnitus.   Eyes: Negative for blurred vision and double vision.  Respiratory: Positive for cough and shortness of breath. Negative for sputum production and wheezing.   Cardiovascular: Negative for chest pain, orthopnea and PND.  Gastrointestinal: Negative for nausea, vomiting, abdominal pain and diarrhea.  Genitourinary: Negative for dysuria and hematuria.  Neurological: Negative for dizziness, sensory change and focal weakness.  All other systems reviewed and are negative.   Nutrition: Heart healthy Tolerating Diet: Yes   DRUG ALLERGIES:   Allergies  Allergen Reactions  . Contrast Media [Iodinated Diagnostic Agents] Shortness Of Breath  . Tramadol Nausea Only  . Valium [Diazepam] Other (See Comments)    Reaction:  Elevated heart rate  . Iodine Strong [Iodine] Rash  . Penicillins Rash    VITALS:  Blood pressure 123/65, pulse 57, temperature 98 F (36.7 C), temperature source Oral, resp. rate 17, height 5\' 1"  (1.549 m), weight 67.722 kg (149 lb 4.8 oz), SpO2 98 %.  PHYSICAL EXAMINATION:   Physical Exam  GENERAL:  81 y.o.-year-old patient lying in the bed with no acute distress.  EYES: Pupils equal, round, reactive to light and accommodation. No scleral icterus. Extraocular muscles intact.  HEENT: Head atraumatic, normocephalic. Oropharynx and nasopharynx clear.  NECK:  Supple, no jugular venous distention. No thyroid enlargement, no tenderness.  LUNGS: Normal breath sounds bilaterally, no wheezing, rales, rhonchi. No use of accessory  muscles of respiration.  CARDIOVASCULAR: S1, S2 RRR. II/VI Diastolic murmur at base, No rubs, gallops.  ABDOMEN: Soft, nontender, nondistended. Bowel sounds present. No organomegaly or mass.  EXTREMITIES: No cyanosis, clubbing or edema b/l. Pressure ulcer on right foot (heel) NEUROLOGIC: Cranial nerves II through XII are intact. No focal Motor or sensory deficits b/l.  Globally weak & bedbound PSYCHIATRIC: The patient is alert and oriented x 3. Good Affect.  SKIN: No obvious rash, lesion.  Pressure ulcer on right foot (heel).    LABORATORY PANEL:   CBC  Recent Labs Lab 09/01/14 0509 09/04/14 0428  WBC 6.4  --   HGB 8.6* 7.9*  HCT 27.8*  --   PLT 232  --    ------------------------------------------------------------------------------------------------------------------  Chemistries   Recent Labs Lab 09/04/14 0428  NA 141  K 4.5  CL 106  CO2 29  GLUCOSE 148*  BUN 50*  CREATININE 2.00*  CALCIUM 8.3*   ------------------------------------------------------------------------------------------------------------------  Cardiac Enzymes  Recent Labs Lab 09/01/14 1221  TROPONINI 1.74*   ------------------------------------------------------------------------------------------------------------------  RADIOLOGY:  Dg Chest 1 View  09/03/2014   CLINICAL DATA:  PICC line placement  EXAM: CHEST  1 VIEW  COMPARISON:  09/03/2014 at 1057 hours  FINDINGS: Right arm PICC terminates in the lower SVC.  Cardiomegaly with pulmonary vascular congestion and possible mild interstitial edema.  Left lung base is obscured. Small left pleural effusion is not excluded. No pneumothorax.  Postsurgical changes related to prior CABG.  Suspected chronic left humeral neck fracture deformity.  IMPRESSION: Right arm PICC terminates in the lower SVC.  Otherwise unchanged.   Electronically  Signed   By: Charline Bills M.D.   On: 09/03/2014 14:56   Dg Chest 1 View  09/03/2014   CLINICAL DATA:   81 year old female with shortness of breath. Urinary tract infection. Congestive heart failure. Initial encounter.  EXAM: CHEST  1 VIEW  COMPARISON:  09/02/2014 and earlier.  FINDINGS: Portable AP upright view at 1059 hours. Regressed pulmonary vascular congestion since 08/31/2014. Pulmonary vascularity now appears near baseline. Stable cardiomegaly and mediastinal contours. Sequelae of CABG. Increased retrocardiac opacity, chronic to a degree but with increased obscuration of the left hemidiaphragm from baseline. No pneumothorax. No other confluent pulmonary opacity.  IMPRESSION: 1. Regressed pulmonary vascular congestion since 08/31/2014. 2. Chronic cardiomegaly. Increased retrocardiac opacity could reflect atelectasis or pneumonia. PA and lateral radiographs may evaluate further when possible.   Electronically Signed   By: Odessa Fleming M.D.   On: 09/03/2014 11:16     ASSESSMENT AND PLAN:   81 year old female with past medical history of diabetes, hyperlipidemia, COPD, peripheral vascular disease, chronic systolic CHF, chronic atrial fibrillation, history of recent ESBL UTI, who presents to the hospital due to shortness of breath and noted to be in mild CHF.   #1 acute on chronic systolic heart failure: Ejection fraction 40-45%.  -Creatinine up to 2 today.  Off lasix now and will cont. To hold.  Repeat CXR 7/17 showing improving vascular congestion.  -Continue Coreg, Lotensin. -Appreciate cardiology input and cont. Current care.  #2 pneumonia-this was a suspected diagnosis given patient's chest x-ray findings on admission. -Clinically patient is afebrile with a normal white cell count and hemodynamically stable. - presently on Vancomycin but will d/c as BC (-) so far.  Start Oral Levaquin.  - follow clinically.   #3 elevated troponin, coronary artery disease status post CABG: Appreciate cardiology consultation. Continue medical management including Coreg, Imdur, Pravachol. Her troponin level has been  stable and is likely due to CHF exacerbation and strain.  #4 paroxysmal atrial fibrillation: Currently in normal sinus rhythm and rate controlled.  -Continue Coreg, amiodarone  -Continue Eliquis    #5 diabetes mellitus: Blood sugar stable. -Continue Lantus, sliding scale insulin.  #6 hypothyroidism-continue Synthroid.  #7 ESBL UTI-as per patient's primary care physician's office her urine is still positive for UTI and cultures positive for ESBL.  -s/p PICC line yesterday and cont. IV Meropenem for now.  Likely will need home health services upon discharge  #8 hyperlipidemia-continue Pravachol.  #9 diabetic neuropathy-continue Neurontin.   All the records are reviewed and case discussed with Care Management/Social Workerr. Management plans discussed with the patient, family and they are in agreement.  CODE STATUS: Full  DVT Prophylaxis: Apixiban  TOTAL TIME TAKING CARE OF THIS PATIENT: 25 minutes.   POSSIBLE D/C tomorrow a.m., DEPENDING ON CLINICAL CONDITION.   Houston Siren M.D on 09/04/2014 at 10:41 AM  Between 7am to 6pm - Pager - [phone]  After 6pm go to [email or web address] - password [email or web address] [facility]  [facility]  Office  [phone]  CC: Primary care physician; Fidel Levy, MD

## 2014-09-05 DIAGNOSIS — I5032 Chronic diastolic (congestive) heart failure: Secondary | ICD-10-CM

## 2014-09-05 LAB — GLUCOSE, CAPILLARY
Glucose-Capillary: 117 mg/dL — ABNORMAL HIGH (ref 65–99)
Glucose-Capillary: 187 mg/dL — ABNORMAL HIGH (ref 65–99)
Glucose-Capillary: 132 mg/dL — ABNORMAL HIGH (ref 65–99)

## 2014-09-05 LAB — BASIC METABOLIC PANEL
ANION GAP: 5 (ref 5–15)
BUN: 49 mg/dL — AB (ref 6–20)
CALCIUM: 8.3 mg/dL — AB (ref 8.9–10.3)
CO2: 29 mmol/L (ref 22–32)
Chloride: 105 mmol/L (ref 101–111)
Creatinine, Ser: 1.78 mg/dL — ABNORMAL HIGH (ref 0.44–1.00)
GFR calc Af Amer: 30 mL/min — ABNORMAL LOW (ref >60)
GFR calc non Af Amer: 26 mL/min — ABNORMAL LOW (ref >60)
GLUCOSE: 122 mg/dL — AB (ref 65–99)
Potassium: 4.9 mmol/L (ref 3.5–5.1)
Sodium: 139 mmol/L (ref 135–145)

## 2014-09-05 LAB — CULTURE, BLOOD (ROUTINE X 2): Culture: NO GROWTH

## 2014-09-05 MED ORDER — SODIUM CHLORIDE 0.9 % IV SOLN: 500.0000 mg | Freq: Two times a day (BID) | INTRAVENOUS | Status: AC

## 2014-09-05 NOTE — Discharge Instructions (Addendum)
°  DIET:  Cardiac diet and Diabetic diet  DISCHARGE CONDITION:  Stable  ACTIVITY:  Activity as tolerated  OXYGEN:  Home Oxygen: No.   Oxygen Delivery: None  DISCHARGE LOCATION:  Home with Home health nursing/PT/OT/Aid.    If you experience worsening of your admission symptoms, develop shortness of breath, life threatening emergency, suicidal or homicidal thoughts you must seek medical attention immediately by calling 911 or calling your MD immediately  if symptoms less severe.  You Must read complete instructions/literature along with all the possible adverse reactions/side effects for all the Medicines you take and that have been prescribed to you. Take any new Medicines after you have completely understood and accpet all the possible adverse reactions/side effects.   Please note  You were cared for by a hospitalist during your hospital stay. If you have any questions about your discharge medications or the care you received while you were in the hospital after you are discharged, you can call the unit and asked to speak with the hospitalist on call if the hospitalist that took care of you is not available. Once you are discharged, your primary care physician will handle any further medical issues. Please note that NO REFILLS for any discharge medications will be authorized once you are discharged, as it is imperative that you return to your primary care physician (or establish a relationship with a primary care physician if you do not have one) for your aftercare needs so that they can reassess your need for medications and monitor your lab values.  [facility] AND [facility]  [facility] WILL PROVIDE HOSPITAL BED AND W/C PAD

## 2014-09-05 NOTE — Care Management (Signed)
Patient is for discharge home today with resumption of home care services through [facility] ([place]).  Referral has been made to [facility] for hospital bed and wheelchair pad.  If patient qualifies for home 02, referral will be sent to [facility].  [facility] morning doses of IV antibiotic.  Patient's granddaughter Victorino DikeJennifer will administer evening dose.  Medication will be delivered to the home.  Victorino DikeJennifer will transport patient home after her shift today which is around 5"30p.  Will coordinate that patient will receive both of her doses of antibiotic today prior to discharge and [facility] will start 7/20.  Called and faxed additional referral information to [facility]

## 2014-09-05 NOTE — Discharge Summary (Signed)
[facility] [place] [facility]   PATIENT NAME: [name]    MR#:  [id number]  DATE OF BIRTH:  Aug 12, 1931  DATE OF ADMISSION:  08/31/2014 ADMITTING PHYSICIAN: Wyatt Hasteavid K Hower, MD  DATE OF DISCHARGE: 09/05/2014  PRIMARY CARE PHYSICIAN: Fidel LevyJames Hawkins Jr, MD    ADMISSION DIAGNOSIS:  Healthcare-associated pneumonia [J18.9]  DISCHARGE DIAGNOSIS:  Principal Problem:   Healthcare-associated pneumonia  ESBL UTI  SECONDARY DIAGNOSIS:   Past Medical History  Diagnosis Date  . DM2 (diabetes mellitus, type 2)     onset age 81 insulin dependent  . Hyperlipidemia   . COPD (chronic obstructive pulmonary disease)   . Peripheral vascular disease   . CAD (coronary artery disease)     a. MI 1988 s/p CABG in 1988, multiple PCI on SVGs; b. cath 2012: occluded native coronary arteries, patent LIMA to LAD (distal LAD dz), patent SVG-OM & occluded SVG-RCA which filled via left to right collats; c. cath 12/2013 patent LIMA-LAD & SVG-O. known occluded VG-RCA w/ L-R collats, no change since cath 2012  . Chronic systolic CHF (congestive heart failure)     a. 03/2012: EF 40-45%, mild lateral wall HK, mod inf HK, mod post wall HK, mild LVH, mild MR/TR   . Paroxysmal atrial fibrillation     a. CHADSVASc 7: yearly risk of CVA 9.6%;. b. on Eliquis 2.5 mg bid (age, SCr, borderline wt 62.9 Kg)   . HTN (hypertension)   . Yeast infection   . UTI (urinary tract infection)   . Atherosclerosis of native artery of left lower extremity   . Myocardial infarction     HOSPITAL COURSE:   81 year old female with past medical history of diabetes, hyperlipidemia, COPD, peripheral vascular disease, chronic systolic CHF, chronic atrial fibrillation, history of recent ESBL UTI, who presents to the hospital due to shortness of breath and noted to be in mild CHF.  #1 acute on chronic systolic heart failure: Patient was admitted to the hospital started on aggressive diuresis with IV Lasix.  She has clinically improved with diuresis. -Her creatinine started to rise therefore her Lasix was discontinued. At present patient is being discharged on her home dose of torsemide. She will also continue her Coreg and Lotensin. -Patient was seen by cardiology by Dr. Kirke CorinArida who agreed with this management. Patient has had a recent follow-up chest x-rays since admission which shows improvement of vascular congestion.   #2 pneumonia-this was a suspected diagnosis given patient's chest x-ray findings on admission. -Clinically patient has been afebrile, hemodynamically stable and blood cultures remained negative. -Patient was treated with IV vancomycin but since her cultures are negative she is being discharged and just meropenem which should cover for her UTI along with an underlying pneumonia  #3 elevated troponin, coronary artery disease status post CABG: She was seen by cardiology who thought that the elevated troponin was likely secondary to demand ischemia without any evidence of acute coronary syndrome.  -Patient will continue Coreg, Imdur, Pravachol.   #4 paroxysmal atrial fibrillation: Rates have been controlled while in the hospital..  -Continue Coreg, amiodarone  -Continue Eliquis   #5 diabetes mellitus: Blood sugar stable. -Continue Lantus, sliding scale insulin.  #6 hypothyroidism-continue Synthroid.  #7 ESBL UTI-patient is status post PICC line and now being discharged on 10 days of IV meropenem. -patient should have repeat urine cultures and urinalysis done to make sure the UTI has been adequately treated and her cultures are negative.  #8 hyperlipidemia-continue Pravachol.  #9 diabetic neuropathy-continue  Neurontin.  Patient is being discharged home with home health nursing, PT/OT, aid.  DISCHARGE CONDITIONS:   Stable  CONSULTS OBTAINED:  Treatment Team:  Wyatt Haste, MD Iran Ouch, MD  DRUG ALLERGIES:   Allergies  Allergen Reactions  . Contrast Media  [Iodinated Diagnostic Agents] Shortness Of Breath  . Tramadol Nausea Only  . Valium [Diazepam] Other (See Comments)    Reaction:  Elevated heart rate  . Iodine Strong [Iodine] Rash  . Penicillins Rash    DISCHARGE MEDICATIONS:   Current Discharge Medication List    START taking these medications   Details  meropenem 500 mg in sodium chloride 0.9 % 50 mL Inject 500 mg into the vein every 12 (twelve) hours.      CONTINUE these medications which have NOT CHANGED   Details  amiodarone (PACERONE) 200 MG tablet Take 1 tablet (200 mg total) by mouth daily. Qty: 90 tablet, Refills: 3    apixaban (ELIQUIS) 2.5 MG TABS tablet Take 1 tablet (2.5 mg total) by mouth 2 (two) times daily. Qty: 60 tablet, Refills: 6    benazepril (LOTENSIN) 20 MG tablet Take 20 mg by mouth daily.    carvedilol (COREG) 3.125 MG tablet Take 1 tablet (3.125 mg total) by mouth 2 (two) times daily. Qty: 180 tablet, Refills: 3    gabapentin (NEURONTIN) 400 MG capsule Take 400 mg by mouth 3 (three) times daily.    HYDROcodone-acetaminophen (NORCO/VICODIN) 5-325 MG per tablet Take 2 tablets by mouth every 6 (six) hours as needed for moderate pain.     insulin glargine (LANTUS) 100 UNIT/ML injection Inject 0.1 mLs (10 Units total) into the skin at bedtime. Qty: 10 mL, Refills: 11    insulin lispro (HUMALOG) 100 UNIT/ML injection Inject 0.04 mLs (4 Units total) into the skin 3 (three) times daily before meals. Qty: 10 mL, Refills: 11    isosorbide mononitrate (IMDUR) 60 MG 24 hr tablet Take 90 mg by mouth daily.     levothyroxine (SYNTHROID, LEVOTHROID) 100 MCG tablet Take 100 mcg by mouth daily.     nitroGLYCERIN (NITROSTAT) 0.4 MG SL tablet Place 1 tablet (0.4 mg total) under the tongue every 5 (five) minutes as needed for chest pain. Qty: 30 tablet, Refills: 0    nystatin cream (MYCOSTATIN) Apply 1 application topically 2 (two) times daily. Qty: 30 g, Refills: 1   Associated Diagnoses: Candidal skin  infection    potassium chloride (K-DUR) 10 MEQ tablet Take 10 mEq by mouth daily.     pravastatin (PRAVACHOL) 40 MG tablet Take 40 mg by mouth daily.    torsemide (DEMADEX) 20 MG tablet Take 1 tablet (20 mg total) by mouth 2 (two) times daily. Qty: 180 tablet, Refills: 3      STOP taking these medications     NYSTATIN, TOPICAL, POWD          DISCHARGE INSTRUCTIONS:   DIET:  Cardiac diet and Diabetic diet  DISCHARGE CONDITION:  Stable  ACTIVITY:  Activity as tolerated  OXYGEN:  Home Oxygen: No.   Oxygen Delivery: room air  DISCHARGE LOCATION:  Home with home health nursing, PT/OT, aid   If you experience worsening of your admission symptoms, develop shortness of breath, life threatening emergency, suicidal or homicidal thoughts you must seek medical attention immediately by calling 911 or calling your MD immediately  if symptoms less severe.  You Must read complete instructions/literature along with all the possible adverse reactions/side effects for all the Medicines you take and  that have been prescribed to you. Take any new Medicines after you have completely understood and accpet all the possible adverse reactions/side effects.   Please note  You were cared for by a hospitalist during your hospital stay. If you have any questions about your discharge medications or the care you received while you were in the hospital after you are discharged, you can call the unit and asked to speak with the hospitalist on call if the hospitalist that took care of you is not available. Once you are discharged, your primary care physician will handle any further medical issues. Please note that NO REFILLS for any discharge medications will be authorized once you are discharged, as it is imperative that you return to your primary care physician (or establish a relationship with a primary care physician if you do not have one) for your aftercare needs so that they can reassess your need for  medications and monitor your lab values.     Today   Still has a cough but is nonproductive. Overall shortness of breath is improved. Afebrile, hemodynamically stable Creatinine is improving.  VITAL SIGNS:  Blood pressure 117/54, pulse 57, temperature 98 F (36.7 C), temperature source Oral, resp. rate 18, height 5\' 1"  (1.549 m), weight 67.722 kg (149 lb 4.8 oz), SpO2 96 %.  I/O:   Intake/Output Summary (Last 24 hours) at 09/05/14 1423 Last data filed at 09/05/14 1300  Gross per 24 hour  Intake      3 ml  Output    800 ml  Net   -797 ml    PHYSICAL EXAMINATION:  GENERAL:  81 y.o.-year-old patient lying in the bed with no acute distress.  EYES: Pupils equal, round, reactive to light and accommodation. No scleral icterus. Extraocular muscles intact.  HEENT: Head atraumatic, normocephalic. Oropharynx and nasopharynx clear.  NECK:  Supple, no jugular venous distention. No thyroid enlargement, no tenderness.  LUNGS: Normal breath sounds bilaterally, no wheezing, rales,rhonchi. No use of accessory muscles of respiration.  CARDIOVASCULAR: S1, S2 normal. No murmurs, rubs, or gallops.  ABDOMEN: Soft, non-tender, non-distended. Bowel sounds present. No organomegaly or mass.  EXTREMITIES: No pedal edema, cyanosis, or clubbing.  NEUROLOGIC: Cranial nerves II through XII are intact. No focal motor or sensory defecits b/l.  PSYCHIATRIC: The patient is alert and oriented x 3. Good affect.  SKIN: No obvious rash, lesion, or ulcer.   DATA REVIEW:   CBC  Recent Labs Lab 09/01/14 0509 09/04/14 0428  WBC 6.4  --   HGB 8.6* 7.9*  HCT 27.8*  --   PLT 232  --     Chemistries   Recent Labs Lab 09/05/14 0600  NA 139  K 4.9  CL 105  CO2 29  GLUCOSE 122*  BUN 49*  CREATININE 1.78*  CALCIUM 8.3*    Cardiac Enzymes  Recent Labs Lab 09/01/14 1221  TROPONINI 1.74*    Microbiology Results  Results for orders placed or performed during the hospital encounter of 08/31/14   Culture, blood (routine x 2)     Status: None   Collection Time: 08/31/14 10:35 PM  Result Value Ref Range Status   Specimen Description BLOOD LEFT ASSIST CONTROL  Final   Special Requests BOTTLES DRAWN AEROBIC AND ANAEROBIC  Final   Culture NO GROWTH 5 DAYS  Final   Report Status 09/05/2014 FINAL  Final  Culture, blood (routine x 2)     Status: None (Preliminary result)   Collection Time: 08/31/14 10:38 PM  Result Value Ref Range Status   Specimen Description BLOOD RIGHT ARM  Final   Special Requests BOTTLES DRAWN AEROBIC AND ANAEROBIC  Final   Culture NO GROWTH 4 DAYS  Final   Report Status PENDING  Incomplete    RADIOLOGY:  Dg Chest 1 View  09/03/2014   CLINICAL DATA:  PICC line placement  EXAM: CHEST  1 VIEW  COMPARISON:  09/03/2014 at 1057 hours  FINDINGS: Right arm PICC terminates in the lower SVC.  Cardiomegaly with pulmonary vascular congestion and possible mild interstitial edema.  Left lung base is obscured. Small left pleural effusion is not excluded. No pneumothorax.  Postsurgical changes related to prior CABG.  Suspected chronic left humeral neck fracture deformity.  IMPRESSION: Right arm PICC terminates in the lower SVC.  Otherwise unchanged.   Electronically Signed   By: Charline Bills M.D.   On: 09/03/2014 14:56      Management plans discussed with the patient, family and they are in agreement.  CODE STATUS:     Code Status Orders        Start     Ordered   08/31/14 2156  Full code   Continuous     08/31/14 2155    Advance Directive Documentation        Most Recent Value   Type of Advance Directive  Living will   Pre-existing out of facility DNR order (yellow form or pink MOST form)     "MOST" Form in Place?        TOTAL TIME TAKING CARE OF THIS PATIENT: 40 minutes.    Houston Siren M.D on 09/05/2014 at 2:23 PM  Between 7am to 6pm - Pager - [phone]  After 6pm go to [email or web address] - password [email or web address] [facility]  [facility]   Office  [phone]  CC: Primary care physician; Fidel Levy, MD

## 2014-09-05 NOTE — Progress Notes (Signed)
Patient: [name] / Admit Date: 08/31/2014 / Date of Encounter: 09/05/2014, 9:09 AM   Subjective: Feels weak this morning. Some chest pressure. Oxygen decreased to 2L via  this AM. Not on home O2. Overall deconditioned.   Review of Systems: Review of Systems  Constitutional: Positive for malaise/fatigue. Negative for fever, chills, weight loss and diaphoresis.  HENT: Negative for congestion.   Eyes: Negative for discharge and redness.  Respiratory: Positive for shortness of breath. Negative for cough, sputum production and wheezing.   Cardiovascular: Negative for palpitations, orthopnea, claudication, leg swelling and PND.       Chest pressure  Musculoskeletal: Negative for falls.  Skin: Negative for rash.  Neurological: Positive for weakness. Negative for sensory change, speech change and headaches.  Endo/Heme/Allergies: Does not bruise/bleed easily.  Psychiatric/Behavioral: The patient is not nervous/anxious.   All other systems reviewed and are negative.  All other systems reviewed and negative.   Objective: Telemetry: sinus brady, 50's Physical Exam: Blood pressure 118/45, pulse 58, temperature 97.9 F (36.6 C), temperature source Oral, resp. rate 18, height 5\' 1"  (1.549 m), weight 149 lb 4.8 oz (67.722 kg), SpO2 98 %. Body mass index is 28.22 kg/(m^2). General: Well developed, well nourished, in no acute distress. Head: Normocephalic, atraumatic, sclera non-icteric, no xanthomas, nares are without discharge. Neck: Negative for carotid bruits. JVP not elevated. Lungs: Decreased breath sounds bilaterally without wheezes, rales, or rhonchi. Breathing is unlabored. Heart: RRR S1 S2 without murmurs, rubs, or gallops.  Abdomen: Soft, non-tender, non-distended with normoactive bowel sounds. No rebound/guarding. Extremities: No clubbing or cyanosis. No edema. Distal pedal pulses are 2+ and equal bilaterally. Neuro: Alert and oriented X 3. Moves all extremities  spontaneously. Psych:  Responds to questions appropriately with a normal affect.   Intake/Output Summary (Last 24 hours) at 09/05/14 0909 Last data filed at 09/05/14 0415  Gross per 24 hour  Intake      0 ml  Output    600 ml  Net   -600 ml    Inpatient Medications:  . amiodarone  200 mg Oral Daily  . apixaban  2.5 mg Oral BID  . benazepril  20 mg Oral Daily  . carvedilol  3.125 mg Oral BID  . gabapentin  400 mg Oral TID  . insulin aspart  0-5 Units Subcutaneous QHS  . insulin aspart  0-9 Units Subcutaneous TID WC  . insulin glargine  30 Units Subcutaneous QHS  . isosorbide mononitrate  90 mg Oral Daily  . levothyroxine  100 mcg Oral QAC breakfast  . meropenem (MERREM) IV  500 mg Intravenous Q12H  . nystatin cream  1 application Topical BID  . pravastatin  40 mg Oral Daily  . sodium chloride  10-40 mL Intracatheter Q12H  . sodium chloride  3 mL Intravenous Q12H   Infusions:    Labs:  Recent Labs  09/04/14 0428 09/05/14 0600  NA 141 139  K 4.5 4.9  CL 106 105  CO2 29 29  GLUCOSE 148* 122*  BUN 50* 49*  CREATININE 2.00* 1.78*  CALCIUM 8.3* 8.3*   No results for input(s): AST, ALT, ALKPHOS, BILITOT, PROT, ALBUMIN in the last 72 hours.  Recent Labs  09/04/14 0428  HGB 7.9*   No results for input(s): CKTOTAL, CKMB, TROPONINI in the last 72 hours. Invalid input(s): POCBNP No results for input(s): HGBA1C in the last 72 hours.   Weights: Filed Weights   08/31/14 2051 09/01/14 0047  Weight: 142 lb (64.411 kg)  149 lb 4.8 oz (67.722 kg)     Radiology/Studies:  Dg Chest 1 View  09/03/2014   CLINICAL DATA:  PICC line placement  EXAM: CHEST  1 VIEW  COMPARISON:  09/03/2014 at 1057 hours  FINDINGS: Right arm PICC terminates in the lower SVC.  Cardiomegaly with pulmonary vascular congestion and possible mild interstitial edema.  Left lung base is obscured. Small left pleural effusion is not excluded. No pneumothorax.  Postsurgical changes related to prior CABG.   Suspected chronic left humeral neck fracture deformity.  IMPRESSION: Right arm PICC terminates in the lower SVC.  Otherwise unchanged.   Electronically Signed   By: Charline BillsSriyesh  Krishnan M.D.   On: 09/03/2014 14:56   Dg Chest 1 View  09/03/2014   CLINICAL DATA:  81 year old female with shortness of breath. Urinary tract infection. Congestive heart failure. Initial encounter.  EXAM: CHEST  1 VIEW  COMPARISON:  09/02/2014 and earlier.  FINDINGS: Portable AP upright view at 1059 hours. Regressed pulmonary vascular congestion since 08/31/2014. Pulmonary vascularity now appears near baseline. Stable cardiomegaly and mediastinal contours. Sequelae of CABG. Increased retrocardiac opacity, chronic to a degree but with increased obscuration of the left hemidiaphragm from baseline. No pneumothorax. No other confluent pulmonary opacity.  IMPRESSION: 1. Regressed pulmonary vascular congestion since 08/31/2014. 2. Chronic cardiomegaly. Increased retrocardiac opacity could reflect atelectasis or pneumonia. PA and lateral radiographs may evaluate further when possible.   Electronically Signed   By: Odessa FlemingH  Hall M.D.   On: 09/03/2014 11:16   Dg Chest 2 View  08/31/2014   CLINICAL DATA:  Shortness of Breath  EXAM: CHEST  2 VIEW  COMPARISON:  August 13, 2014  FINDINGS: There is focal left base infiltrate, obscuring the left hemidiaphragm posteriorly. Lungs elsewhere clear. Heart is mildly enlarged with pulmonary vascularity within normal limits. There is atherosclerotic change in aorta. Patient is status post coronary artery bypass grafting. There is evidence of an old healed fracture of the proximal humeral metaphysis. Bones are diffusely osteoporotic.  IMPRESSION: Focally of infiltrate left base. Lungs otherwise clear. Atherosclerotic calcification present. Heart mildly enlarged.  Followup PA and lateral chest radiographs recommended in 3-4 weeks following trial of antibiotic therapy to ensure resolution and exclude underlying  malignancy.   Electronically Signed   By: Bretta BangWilliam  Woodruff III M.D.   On: 08/31/2014 21:36   Dg Chest Port 1 View  09/02/2014   CLINICAL DATA:  Hypoxia.  Followup exam.  EXAM: PORTABLE CHEST - 1 VIEW  COMPARISON:  08/31/2014.  FINDINGS: Left lung base opacity is similar to the prior study. There is hazy type opacity in the right mid and lower lung which may be from a combination of pleural fluid and the overlying soft tissues. There is decreased interstitial thickening compared to the previous exam. No pneumothorax.  Changes from CABG surgery are stable. Cardiopericardial silhouette is mildly enlarged. No mediastinal or hilar masses  IMPRESSION: 1. Mild improvement prior study with decreased interstitial thickening suggesting improved congestive heart failure. 2. Persistent left lung base opacity which may reflect pneumonia. Atelectasis is possible.   Electronically Signed   By: Amie Portlandavid  Ormond M.D.   On: 09/02/2014 08:01   Dg Chest Port 1 View  08/13/2014   CLINICAL DATA:  Acute onset of shortness of breath. Initial encounter.  EXAM: PORTABLE CHEST - 1 VIEW  COMPARISON:  Chest radiograph performed 06/16/2014  FINDINGS: The lungs are well-aerated. Mild vascular congestion is noted. Mild bibasilar opacities likely reflect atelectasis, though minimal interstitial edema might have a  similar appearance. There is no evidence of pleural effusion or pneumothorax.  The cardiomediastinal silhouette is borderline enlarged. The patient is status post median sternotomy, with evidence of prior CABG. No acute osseous abnormalities are seen.  IMPRESSION: Mild vascular congestion and borderline cardiomegaly. Mild bibasilar airspace opacities likely reflect atelectasis, though minimal interstitial edema might have a similar appearance.   Electronically Signed   By: Roanna Raider M.D.   On: 08/13/2014 05:33     Assessment and Plan  1. Acute on chronic systolic CHF, EF 16-10%:  -With concomitant PNA -Improving CXR on  7/17 -Lasix stopped on 7/17 secondary to azotemia -Plan to discharge on torsemide 20 mg bid  -Continue Coreg and Lotensin  -Recommend leg exercises by PT, she reports she is not able to ambulate 2/2 hip pain and will likely fall  2. Elevated troponin/CAD s/p CABG in 1988: -Mildly elevated and generally flat trending as above -Patient with recent cardiac cath in April 2016 that was unchanged from prior cardiac cath 2012  -Elevated troponin likely supply demand ischemia in the setting of her left base pneumonia, anemia (chronic issue), and acute on chronic systolic CHF -No further ischemic evaluation at this time given the above recent cardiac catheterization  -No angina  -Continue Coreg 3.125 mg bid, Imdur 90 mg daily, pravastatin 40 mg   3. PAF: -Maintaining NSR -Continue amiodarone 200 mg daily and Eliquis 2.5 mg bid -CHADSVASc at least 7 giving her an estimated annual stroke risk of 9.6%  4. Anemia: -Maintain hgb >8 to 8.5  5. ESBL UTI: -PICC line placed for ABX -Recommend follow up urine culture to check for clearing   6. PNA: -On empiric vancomycin per IM -Possible retrocardiac opacity on repeat CXR on 7/17  7. HLD: -Continue statin  8. Hypothyroidism: -Synthroid   9. DM: -SSI per IM  10. Chronic hip pain: -Surgery has been postponed  -Unable to ambulate 2/2 increased fall risk -Recommend leg exercises with PT -Long term would need surgery for favorable prognosis    Signed, Eula Listen, PA-C Pager: [phone] 09/05/2014, 9:09 AM  Attending Note:   The patient was seen and examined.  Agree with assessment and plan as noted above.  Changes made to the above note as needed.  Pt seems to be be doing much better.  Agree with plans for DC today    Alvia Grove., MD, [facility] 09/05/2014, 1:52 PM [address]  [address] Office [phone] Pager [phone]

## 2014-09-05 NOTE — Progress Notes (Signed)
Patient d/c'd home. Education provided, no questions at this time. Patient to be picked up by daughter. Telemetry removed. Abram Sax R [place]  

## 2014-09-05 NOTE — Progress Notes (Signed)
SATURATION QUALIFICATIONS: (This note is used to comply with regulatory documentation for home oxygen)  Patient Saturations on Room Air at Rest = 95%  Patient Saturations on Room Air while Ambulating = 93% Patient unable to ambulate, wheelchair bound at home. 93% sat on RA with patient turing in bed.  Patient Saturations on N/A Liters of oxygen while Ambulating = N/A  Please briefly explain why patient needs home oxygen: Patient complained of SOB this am, 2L of O2 placed on patient. O2 sats 95% and greater on RA  Principal FinancialBrandi R Mansfield

## 2014-09-05 NOTE — Care Management (Signed)
Informed Sara BiddingJennifer Phillips (granddaughter) of copay for the medication and the plans covered in previous note.  Patient does not require home 02

## 2014-09-06 ENCOUNTER — Telehealth: Payer: Self-pay | Admitting: Family Medicine

## 2014-09-06 NOTE — Telephone Encounter (Signed)
Advised on VM to [name] Verbal for Skilled PT and Nursing. [facility]

## 2014-09-06 NOTE — Telephone Encounter (Signed)
[facility] for pt to have skilled nursing and PT.  Please call [phone]

## 2014-09-08 ENCOUNTER — Telehealth: Payer: Self-pay

## 2014-09-08 ENCOUNTER — Telehealth: Payer: Self-pay | Admitting: Cardiovascular Disease

## 2014-09-08 ENCOUNTER — Telehealth: Payer: Self-pay | Admitting: Family Medicine

## 2014-09-08 LAB — CULTURE, BLOOD (ROUTINE X 2): CULTURE: NO GROWTH

## 2014-09-08 NOTE — Telephone Encounter (Signed)
This is a Educational psychologist pt.  I will forward message to that office.

## 2014-09-08 NOTE — Telephone Encounter (Signed)
Please advise her to call Dr. Kirke Corin office as this is heart failure related and he may need adjust her medications.

## 2014-09-08 NOTE — Telephone Encounter (Signed)
Patient having 2-3 in pitting edema x 24 hours at least. Home Health saw her yesterday and it was not as bad. Taking Lasix 40 a;ready. Please advise on what next.

## 2014-09-08 NOTE — Telephone Encounter (Signed)
Please review this. Also why are we gettign all the notes on her? I thought she was your patient. [facility]

## 2014-09-08 NOTE — Telephone Encounter (Signed)
[name] from [facility] wanted to let you know pt was also taking 10 meqs of potassium daily.[name]

## 2014-09-08 NOTE — Telephone Encounter (Signed)
New Prob    Nurse states he noted pt had 2 plus pitting edema to lower extremities bilaterally. Pt takes 40 mg of Lasix and Potassium 10 MEQ daily. Requesting a call back.

## 2014-09-08 NOTE — Telephone Encounter (Signed)
I have called this patient and advised her to call Dr. Kirke Corin and ask his nurse for advice. She stated she will call [facility] this afternoon.  Apparently it was the [facility] RN who called. She requested that inform him of my advice.

## 2014-09-11 MED ORDER — TORSEMIDE 20 MG PO TABS: 20.0000 mg | ORAL_TABLET | Freq: Two times a day (BID) | ORAL | Status: DC

## 2014-09-11 MED ORDER — TORSEMIDE 20 MG PO TABS: ORAL_TABLET | ORAL | Status: DC

## 2014-09-11 NOTE — Telephone Encounter (Signed)
Spoke w/ pt's home health nurse, Trey Paula.  He reports that pt was recently hospitalized for exacerbation of CHF and is continuing to have swelling. Pt reports that she has poor appetite, not has not eaten much.  She was constipated, felt better after BM and Gas-X, but does report abdominal swelling. She does not have scales and is unable to stand due to her left hip.  On discussion, Trey Paula reports that pt has been continuing to take Lasix. Advised him that this was d/c'd on 07/25/14 and pt was changed to torsemide 20 mg BID.  He asks that I resend this rx to the pharmacy, as pt has not been taking.  He will call back of pt's sx do not improve.

## 2014-09-11 NOTE — Telephone Encounter (Signed)
[name] called back and left message on my vm while I was at lunch. He reports that he apparently had pt's old med list and that pt HAS been taking torsemide 20 mg BID since release from hospital, but is continuing to swell. He would like to know Dr. Jari Sportsman recommendation and if he should increase torsemide. Please advise.  Thank you.

## 2014-09-11 NOTE — Addendum Note (Signed)
Addended by: Rhea Belton R on: 09/11/2014 03:01 PM   Modules accepted: Orders

## 2014-09-11 NOTE — Telephone Encounter (Signed)
Left message on Jeff's vm w/ Dr. Jari Sportsman recommendation.  Asked him to call back w/ any questions or if pt would like to have BMET drawn here.

## 2014-09-11 NOTE — Telephone Encounter (Signed)
Please see below.

## 2014-09-11 NOTE — Telephone Encounter (Signed)
I reviewed. [name] routes everything to [employer]. I have told them several times that I am the primary and they will get a faster response if it is routed directly to me. However, they require a physician signature so I think it will always be routed to [facility].

## 2014-09-11 NOTE — Telephone Encounter (Signed)
Increase torsemide to 40 mg in the morning and 20 mg the afternoon. check basic metabolic profile in one week.

## 2014-09-12 ENCOUNTER — Telehealth: Payer: Self-pay | Admitting: Family Medicine

## 2014-09-12 ENCOUNTER — Ambulatory Visit (INDEPENDENT_AMBULATORY_CARE_PROVIDER_SITE_OTHER): Payer: Medicare Other | Admitting: Family Medicine

## 2014-09-12 ENCOUNTER — Encounter: Payer: Self-pay | Admitting: Family Medicine

## 2014-09-12 VITALS — BP 114/71 | HR 58 | Temp 97.8°F | Resp 16 | Ht 61.0 in

## 2014-09-12 DIAGNOSIS — G8929 Other chronic pain: Secondary | ICD-10-CM | POA: Diagnosis not present

## 2014-09-12 DIAGNOSIS — J449 Chronic obstructive pulmonary disease, unspecified: Secondary | ICD-10-CM | POA: Diagnosis not present

## 2014-09-12 DIAGNOSIS — I5022 Chronic systolic (congestive) heart failure: Secondary | ICD-10-CM | POA: Diagnosis not present

## 2014-09-12 DIAGNOSIS — N390 Urinary tract infection, site not specified: Secondary | ICD-10-CM

## 2014-09-12 DIAGNOSIS — B354 Tinea corporis: Secondary | ICD-10-CM

## 2014-09-12 DIAGNOSIS — M25552 Pain in left hip: Secondary | ICD-10-CM | POA: Diagnosis not present

## 2014-09-12 MED ORDER — ALBUTEROL SULFATE (2.5 MG/3ML) 0.083% IN NEBU: 2.5000 mg | INHALATION_SOLUTION | Freq: Four times a day (QID) | RESPIRATORY_TRACT | Status: AC | PRN

## 2014-09-12 MED ORDER — HYDROCODONE-ACETAMINOPHEN 5-325 MG PO TABS: 1.0000 | ORAL_TABLET | Freq: Two times a day (BID) | ORAL | Status: DC | PRN

## 2014-09-12 NOTE — Assessment & Plan Note (Signed)
Nebulizer machine and albuterol nebs PRN for shortness of breath. Pt will need updated spirometry at next visit.  Alarm symptoms reviewed.

## 2014-09-12 NOTE — Assessment & Plan Note (Signed)
Continue home abx for EBSL. Pt will follow with urology on 8/5. Repeat cultures will need to be done after completion of abx.

## 2014-09-12 NOTE — Telephone Encounter (Signed)
Called HH to determine how to get nebulizer machine through [facility] for patient.

## 2014-09-12 NOTE — Assessment & Plan Note (Signed)
Continue current diuretic pill per the instructions of Dr. Kirke Corin yesterday. Pt advised to give the fluid pills a few days to work. If swelling increases dramatically in 2-3 days she should consult cardiology, she may need to be seen sooner.

## 2014-09-12 NOTE — Progress Notes (Signed)
Subjective:    Patient ID: [name], female    DOB: Feb 07, 1932, 81 y.o.   MRN: [id number]  HPI: [name] is a 81 y.o. female presenting on 09/12/2014 for Follow-up   HPI Pt presents for hospital follow-up today. She was recently hospitalized for CHF and pneumonia. She is currently taking Toresmide  in the AM and  in the PM per cardiology. She will have a BMET drawn next week by home health. She will see Dr. Kirke Corin (cardiology) on August 11.  She was also found to have an EBSL UTI in the hospital- PICC line was placed for 10 days for IV abx at home. She will finish on Saturday. She will follow-up with urology Aug 5 regarding her UTIs.   She is reporting swelling in the abdomen and BLE.  She is also reporting shortness of breath at night. Cardiology suggested she have a nebulizer machine at home.  Pt is concerned her diuretic is not working like it should.  Pt is requesting refill of her pain medication for L hip pain.   Past Medical History  Diagnosis Date  . DM2 (diabetes mellitus, type 2)     onset age 42 insulin dependent  . Hyperlipidemia   . COPD (chronic obstructive pulmonary disease)   . Peripheral vascular disease   . CAD (coronary artery disease)     a. MI 1988 s/p CABG in 1988, multiple PCI on SVGs; b. cath 2012: occluded native coronary arteries, patent LIMA to LAD (distal LAD dz), patent SVG-OM & occluded SVG-RCA which filled via left to right collats; c. cath 12/2013 patent LIMA-LAD & SVG-O. known occluded VG-RCA w/ L-R collats, no change since cath 2012  . Chronic systolic CHF (congestive heart failure)     a. 03/2012: EF 40-45%, mild lateral wall HK, mod inf HK, mod post wall HK, mild LVH, mild MR/TR   . Paroxysmal atrial fibrillation     a. CHADSVASc 7: yearly risk of CVA 9.6%;. b. on Eliquis 2.5 mg bid (age, SCr, borderline wt 62.9 Kg)   . HTN (hypertension)   . Yeast infection   . UTI (urinary tract infection)   . Atherosclerosis of native  artery of left lower extremity   . Myocardial infarction     Current Outpatient Prescriptions on File Prior to Visit  Medication Sig  . amiodarone (PACERONE) 200 MG tablet Take 1 tablet (200 mg total) by mouth daily.  [name] apixaban (ELIQUIS) 2.5 MG TABS tablet Take 1 tablet (2.5 mg total) by mouth 2 (two) times daily.  . benazepril (LOTENSIN) 20 MG tablet Take 20 mg by mouth daily.  . carvedilol (COREG) 3.125 MG tablet Take 1 tablet (3.125 mg total) by mouth 2 (two) times daily.  [name] gabapentin (NEURONTIN) 400 MG capsule Take 400 mg by mouth 3 (three) times daily.  . insulin glargine (LANTUS) 100 UNIT/ML injection Inject 0.1 mLs (10 Units total) into the skin at bedtime. (Patient taking differently: Inject 30 Units into the skin at bedtime. )  . insulin lispro (HUMALOG) 100 UNIT/ML injection Inject 0.04 mLs (4 Units total) into the skin 3 (three) times daily before meals. (Patient taking differently: Inject 10 Units into the skin 3 (three) times daily before meals. )  . isosorbide mononitrate (IMDUR) 60 MG 24 hr tablet Take 90 mg by mouth daily.   [name] levothyroxine (SYNTHROID, LEVOTHROID) 100 MCG tablet Take 100 mcg by mouth daily.   . meropenem 500 mg in sodium chloride 0.9 % 50 mL  Inject 500 mg into the vein every 12 (twelve) hours.  . nitroGLYCERIN (NITROSTAT) 0.4 MG SL tablet Place 1 tablet (0.4 mg total) under the tongue every 5 (five) minutes as needed for chest pain.  [name] nystatin cream (MYCOSTATIN) Apply 1 application topically 2 (two) times daily.  . potassium chloride (K-DUR) 10 MEQ tablet Take 10 mEq by mouth daily.   . pravastatin (PRAVACHOL) 40 MG tablet Take 40 mg by mouth daily.  [name] torsemide (DEMADEX) 20 MG tablet Take 40 mg in the morning and 20 mg in the afternoon.   No current facility-administered medications on file prior to visit.    Review of Systems  Constitutional: Negative for fever and chills.  HENT: Negative.   Eyes: Negative.   Respiratory: Positive for shortness of  breath (at night.). Negative for cough, chest tightness and wheezing.   Cardiovascular: Positive for leg swelling. Negative for chest pain and palpitations.  Genitourinary: Positive for difficulty urinating (pt reports she is only voiding once per day. ). Negative for dysuria and hematuria.  Musculoskeletal: Positive for arthralgias (chronic R hip pain).  Neurological: Negative for dizziness, weakness and light-headedness.  Psychiatric/Behavioral: Negative.    Per HPI unless specifically indicated above     Objective:    BP 114/71 mmHg  Pulse 58  Temp(Src) 97.8 F (36.6 C) (Oral)  Resp 16  Ht 5\' 1"  (1.549 m)  Wt   SpO2 93%  Wt Readings from Last 3 Encounters:  09/01/14 149 lb 4.8 oz (67.722 kg)  08/22/14 142 lb (64.411 kg)  08/13/14 152 lb 3.2 oz (69.037 kg)    Physical Exam  Constitutional: She is oriented to person, place, and time. She appears well-developed and well-nourished. No distress.  HENT:  Head: Normocephalic and atraumatic.  Neck: Normal range of motion. Neck supple.  Cardiovascular: Normal rate, regular rhythm, S1 normal and S2 normal.   Murmur heard.  Systolic murmur is present with a grade of 1/6  Pulses:      Dorsalis pedis pulses are 1+ on the right side, and 1+ on the left side.  Pulmonary/Chest: No respiratory distress. She has no decreased breath sounds. She has no wheezes. She has no rhonchi. She has rales (fine crackles bilateral bases, improved from last exam. ) in the right lower field and the left lower field. Chest wall is not dull to percussion. She exhibits no tenderness.  Abdominal: Soft. Normal appearance and bowel sounds are normal. She exhibits no shifting dullness. There is no tenderness.  Musculoskeletal: She exhibits edema (+2 pitting edema BLE to the knee. ).  Lymphadenopathy:    She has no cervical adenopathy.  Neurological: She is alert and oriented to person, place, and time.  Skin: She is not diaphoretic.       Assessment & Plan:    Problem List Items Addressed This Visit      Cardiovascular and Mediastinum   Chronic systolic heart failure - Primary    Continue current diuretic pill per the instructions of Dr. Kirke Corin yesterday. Pt advised to give the fluid pills a few days to work. If swelling increases dramatically in 2-3 days she should consult cardiology, she may need to be seen sooner.         Respiratory   COPD (chronic obstructive pulmonary disease)    Nebulizer machine and albuterol nebs PRN for shortness of breath. Pt will need updated spirometry at next visit.  Alarm symptoms reviewed.        Relevant Medications   albuterol (  PROVENTIL) (2.5 MG/3ML) 0.083% nebulizer solution   Other Relevant Orders   DME Nebulizer machine     Genitourinary   UTI (lower urinary tract infection)    Continue home abx for EBSL. Pt will follow with urology on 8/5. Repeat cultures will need to be done after completion of abx.       Relevant Medications   INVANZ 1 G injection     Other   Chronic left hip pain   Relevant Medications   HYDROcodone-acetaminophen (NORCO/VICODIN) 5-325 MG per tablet    Other Visit Diagnoses    Tinea corporis        Continue to apply nystatin cream twice daily to affected areas.     Relevant Medications    INVANZ 1 G injection       Meds ordered this encounter  Medications  . INVANZ 1 G injection    Sig:   . DISCONTD: meropenem (MERREM) 500 MG injection    Sig:   . potassium chloride (K-DUR,KLOR-CON) 10 MEQ tablet    Sig:   . sodium chloride 0.9 % infusion    Sig:   . DISCONTD: furosemide (LASIX) 20 MG tablet    Sig:   . HYDROcodone-acetaminophen (NORCO/VICODIN) 5-325 MG per tablet    Sig: Take 1-2 tablets by mouth 2 (two) times daily as needed for moderate pain.    Dispense:  30 tablet    Refill:  0    Order Specific Question:  Supervising Provider    Answer:  Janeann Forehand [phone]  . albuterol (PROVENTIL) (2.5 MG/3ML) 0.083% nebulizer solution    Sig: Take 3 mLs  (2.5 mg total) by nebulization every 6 (six) hours as needed for wheezing or shortness of breath.    Dispense:  150 mL    Refill:  1    Order Specific Question:  Supervising Provider    Answer:  Janeann Forehand [[id number]]      Follow up plan: Return in about 4 weeks (around 10/10/2014) for breathing, COPD.

## 2014-09-12 NOTE — Patient Instructions (Signed)
COPD/ Shortness of breath: Lets start you on nebulizer machine as needed for shortness of breath. CHF: Please take the toresmide as directed by Dr. Kirke Corin. He will get follow-up labs.  UTI: Please finish your antibiotics. You will need a urine culture to prove the bug is gone- ideally this can be done by urology on your August 5 visit.   Rash: Please apply the nystatin cream twice daily to irritated areas. If you notice white, cheesy, foul smelling discharge from the vagina, please let me know.   Please seek immediate medical attention at ER or Urgent Care if you develop: Chest pain, pressure or tightness. Shortness of breath accompanied by nausea or diaphoresis Visual changes Numbness or tingling on one side of the body Facial droop Altered mental status Or any concerning symptoms.

## 2014-09-13 NOTE — Telephone Encounter (Signed)
Spoke to nurse and told that pt has Rx.

## 2014-09-19 LAB — BASIC METABOLIC PANEL
BUN: 20 mg/dL (ref 4–21)
CREATININE: 1.8 mg/dL — AB (ref <=1.1)
GLUCOSE: 135 mg/dL
POTASSIUM: 4.6 mmol/L (ref 3.4–5.3)
Sodium: 143 mmol/L (ref 137–147)

## 2014-09-22 ENCOUNTER — Ambulatory Visit (INDEPENDENT_AMBULATORY_CARE_PROVIDER_SITE_OTHER): Payer: Medicare Other | Admitting: Urology

## 2014-09-22 ENCOUNTER — Encounter: Payer: Self-pay | Admitting: Urology

## 2014-09-22 VITALS — BP 121/71 | HR 71 | Ht 61.0 in | Wt 141.0 lb

## 2014-09-22 DIAGNOSIS — N390 Urinary tract infection, site not specified: Secondary | ICD-10-CM

## 2014-09-22 DIAGNOSIS — Z87442 Personal history of urinary calculi: Secondary | ICD-10-CM

## 2014-09-22 DIAGNOSIS — R32 Unspecified urinary incontinence: Secondary | ICD-10-CM

## 2014-09-22 LAB — MICROSCOPIC EXAMINATION: Renal Epithel, UA: NONE SEEN /hpf

## 2014-09-22 LAB — BLADDER SCAN AMB NON-IMAGING: Scan Result: 94

## 2014-09-22 LAB — URINALYSIS, COMPLETE
BILIRUBIN UA: NEGATIVE
GLUCOSE, UA: NEGATIVE
KETONES UA: NEGATIVE
NITRITE UA: NEGATIVE
PH UA: 7 (ref 5.0–7.5)
Protein, UA: NEGATIVE
Specific Gravity, UA: 1.02 (ref 1.005–1.030)
UUROB: 1 mg/dL (ref 0.2–1.0)

## 2014-09-22 NOTE — Progress Notes (Signed)
09/22/2014 9:39 AM   [name] October 18, 1931 [id number]  Referring provider: Janeann Forehand., MD [address] [place], [place] [address]  Chief Complaint  Patient presents with  . Recurrent UTI    pt referred for super resisant UTI    HPI: 81 year old female who is recently admitted to the hospital with multiple medical problems including ESBL Escherichia coli UTI, CHF and pneumonia. She was ultimately discharged home with a PICC line and IV antibiotics to complete a 10 day course which she completed last week. Her PICC line remains in place today.  She does report that she was fairly symptomatic from her UTI. She complained of burning with and following urination. She presents at this resolve with treatment of her infection but has since recurred over the past week or so.  She reports that she's had many issues with urinary tract infections over the past few months and has had difficulty clearing her infections.  She wears incontinence pads with little control over bladder for at least the past year.  She gets up 1-2 nightly to void.     Remote history of kidney stones many years ago x 1 but did not require surgery.   She is diabetic, last A1c 7.1.   PMH: Past Medical History  Diagnosis Date  . DM2 (diabetes mellitus, type 2)     onset age 40 insulin dependent  . Hyperlipidemia   . COPD (chronic obstructive pulmonary disease)   . Peripheral vascular disease   . CAD (coronary artery disease)     a. MI 1988 s/p CABG in 1988, multiple PCI on SVGs; b. cath 2012: occluded native coronary arteries, patent LIMA to LAD (distal LAD dz), patent SVG-OM & occluded SVG-RCA which filled via left to right collats; c. cath 12/2013 patent LIMA-LAD & SVG-O. known occluded VG-RCA w/ L-R collats, no change since cath 2012  . Chronic systolic CHF (congestive heart failure)     a. 03/2012: EF 40-45%, mild lateral wall HK, mod inf HK, mod post wall HK, mild LVH, mild MR/TR   . Paroxysmal  atrial fibrillation     a. CHADSVASc 7: yearly risk of CVA 9.6%;. b. on Eliquis 2.5 mg bid (age, SCr, borderline wt 62.9 Kg)   . HTN (hypertension)   . Yeast infection   . UTI (urinary tract infection)   . Atherosclerosis of native artery of left lower extremity   . Myocardial infarction     Surgical History: Past Surgical History  Procedure Laterality Date  . Abdominal hysterectomy    . Coronary artery bypass graft    . Appendectomy    . Cholecystectomy    . Ovary surgery    . Angioplasty / stenting femoral      Bilateral SFA stenting  . Femur surgery    . Cardiac catheterization      mc  . Cardiac catheterization  12/2013    Home Medications:    Medication List       This list is accurate as of: 09/22/14 11:59 PM.  Always use your most recent med list.               albuterol (2.5 MG/3ML) 0.083% nebulizer solution  Commonly known as:  PROVENTIL  Take 3 mLs (2.5 mg total) by nebulization every 6 (six) hours as needed for wheezing or shortness of breath.     amiodarone 200 MG tablet  Commonly known as:  PACERONE  Take 1 tablet (200 mg total) by  mouth daily.     apixaban 2.5 MG Tabs tablet  Commonly known as:  ELIQUIS  Take 1 tablet (2.5 mg total) by mouth 2 (two) times daily.     benazepril 20 MG tablet  Commonly known as:  LOTENSIN  Take 20 mg by mouth daily.     carvedilol 3.125 MG tablet  Commonly known as:  COREG  Take 1 tablet (3.125 mg total) by mouth 2 (two) times daily.     gabapentin 400 MG capsule  Commonly known as:  NEURONTIN  Take 400 mg by mouth 3 (three) times daily.     HYDROcodone-acetaminophen 5-325 MG per tablet  Commonly known as:  NORCO/VICODIN  Take 1-2 tablets by mouth 2 (two) times daily as needed for moderate pain.     insulin lispro 100 UNIT/ML injection  Commonly known as:  HUMALOG  Inject 0.04 mLs (4 Units total) into the skin 3 (three) times daily before meals.     INVANZ 1 G injection  Generic drug:  ertapenem      isosorbide mononitrate 60 MG 24 hr tablet  Commonly known as:  IMDUR  Take 90 mg by mouth daily.     LANTUS SOLOSTAR 100 UNIT/ML Solostar Pen  Generic drug:  Insulin Glargine     levothyroxine 100 MCG tablet  Commonly known as:  SYNTHROID, LEVOTHROID  Take 100 mcg by mouth daily.     nitroGLYCERIN 0.4 MG SL tablet  Commonly known as:  NITROSTAT  Place 1 tablet (0.4 mg total) under the tongue every 5 (five) minutes as needed for chest pain.     nystatin cream  Commonly known as:  MYCOSTATIN  Apply 1 application topically 2 (two) times daily.     potassium chloride 10 MEQ tablet  Commonly known as:  K-DUR  Take 10 mEq by mouth daily.     pravastatin 40 MG tablet  Commonly known as:  PRAVACHOL  Take 40 mg by mouth daily.     torsemide 20 MG tablet  Commonly known as:  DEMADEX  Take 40 mg in the morning and 20 mg in the afternoon.        Allergies:  Allergies  Allergen Reactions  . Contrast Media [Iodinated Diagnostic Agents] Shortness Of Breath  . Tramadol Nausea Only  . Valium [Diazepam] Other (See Comments)    Reaction:  Elevated heart rate  . Iodine Strong [Iodine] Rash  . Penicillins Rash    Family History: Family History  Problem Relation Age of Onset  . Diabetes Mother   . Heart disease Mother   . Hypertension Mother   . Heart disease Father   . Heart disease Brother     Social History:  reports that she quit smoking about 26 years ago. Her smoking use included Cigarettes. She quit after 15 years of use. She has never used smokeless tobacco. She reports that she does not drink alcohol or use illicit drugs.  ROS: UROLOGY Frequent Urination?: No Hard to postpone urination?: Yes Burning/pain with urination?: Yes Get up at night to urinate?: Yes Leakage of urine?: Yes Urine stream starts and stops?: No Trouble starting stream?: No Do you have to strain to urinate?: No Blood in urine?: Yes Urinary tract infection?: Yes Sexually transmitted disease?:  No Injury to kidneys or bladder?: No Painful intercourse?: No Weak stream?: No Currently pregnant?: No Vaginal bleeding?: No Last menstrual period?: No  Gastrointestinal Nausea?: Yes Vomiting?: Yes Indigestion/heartburn?: Yes Diarrhea?: No Constipation?: No  Constitutional Fever: No Night sweats?:  Yes Weight loss?: Yes Fatigue?: Yes  Skin Skin rash/lesions?: No Itching?: No  Eyes Blurred vision?: No Double vision?: No  Ears/Nose/Throat Sore throat?: No Sinus problems?: No  Hematologic/Lymphatic Swollen glands?: No Easy bruising?: Yes  Cardiovascular Leg swelling?: Yes Chest pain?: Yes  Respiratory Cough?: Yes Shortness of breath?: Yes  Endocrine Excessive thirst?: Yes  Musculoskeletal Back pain?: No Joint pain?: No  Neurological Headaches?: No Dizziness?: No  Psychologic Depression?: No Anxiety?: No  Physical Exam: BP 121/71 mmHg  Pulse 71  Ht  (1.549 m)  Wt 141 lb (63.957 kg)  BMI 26.66 kg/m2  Constitutional:  Alert and oriented, No acute distress.  Elderly, in wheel chair. HEENT: [place] AT, moist mucus membranes.  Trachea midline, no masses. Cardiovascular: No clubbing, cyanosis.   Respiratory: Normal respiratory effort, no increased work of breathing. GI: Abdomen is soft, nontender, nondistended, no abdominal masses GU: No CVA tenderness.  Skin: No rashes, bruises or suspicious lesions. Lymph: No cervical or inguinal adenopathy. Neurologic: Grossly intact, no focal deficits, moving all 4 extremities. Psychiatric: Normal mood and affect.  Laboratory Data: Lab Results  Component Value Date   WBC 6.4 09/01/2014   HGB 7.9* 09/04/2014   HCT 27.8* 09/01/2014   MCV 82.5 09/01/2014   PLT 232 09/01/2014    Lab Results  Component Value Date   CREATININE 1.78* 09/05/2014    Lab Results  Component Value Date   HGBA1C 7.1 08/30/2014    Urinalysis Results for orders placed or performed in visit on 09/22/14  CULTURE, URINE  COMPREHENSIVE  Result Value Ref Range   Result 1 Escherichia coli (A)   Microscopic Examination  Result Value Ref Range   WBC, UA >30 (A) 0 -  5 /hpf   RBC, UA 0-2 0 -  2 /hpf   Epithelial Cells (non renal) 0-10 0 - 10 /hpf   Renal Epithel, UA None seen None seen /hpf   Casts Present (A) None seen /lpf   Cast Type Hyaline casts N/A   Bacteria, UA Many (A) None seen/Few  Urinalysis, Complete  Result Value Ref Range   Specific Gravity, UA 1.020 1.005 - 1.030   pH, UA 7.0 5.0 - 7.5   Color, UA Yellow Yellow   Appearance Ur Cloudy (A) Clear   Leukocytes, UA 3+ (A) Negative   Protein, UA Negative Negative/Trace   Glucose, UA Negative Negative   Ketones, UA Negative Negative   RBC, UA 2+ (A) Negative   Bilirubin, UA Negative Negative   Urobilinogen, Ur 1.0 0.2 - 1.0 mg/dL   Nitrite, UA Negative Negative   Microscopic Examination See below:   Bladder Scan (Post Void Residual) in office  Result Value Ref Range   Scan Result 94     Pertinent Imaging: CLINICAL DATA: Acute left-sided pelvic pain after femoral artery catheterization.  EXAM: CT ABDOMEN AND PELVIS WITHOUT CONTRAST  TECHNIQUE: Multidetector CT imaging of the abdomen and pelvis was performed following the standard protocol without IV contrast.  COMPARISON: CT scan of November 26, 2010.  FINDINGS: Severe multilevel degenerative disc disease is noted in the lumbar spine. Minimal bilateral posterior basilar subsegmental atelectasis is noted.  Status post cholecystectomy. The pancreas and spleen appear normal. Stable left adrenal myelolipoma. Right adrenal gland appears normal. No hydronephrosis or renal obstruction is noted. Urinary bladder is decompressed secondary to Foley catheter. Atherosclerosis of abdominal aorta is noted without aneurysm formation. There is no evidence of bowel obstruction. No abnormal fluid collection is noted. Status post right total  hip arthroplasty. Severe degenerative joint  disease of left hip is noted. Inflammatory changes are noted around the sigmoid colon consistent with focal diverticulitis.  IMPRESSION: Findings consistent with focal sigmoid diverticulitis. No abscess is noted.  Stable left adrenal myelolipoma.  No hydronephrosis or renal obstruction is noted.   Electronically Signed  By: Lupita Raider, M.D.  On: 06/14/2014 14:42  Assessment & Plan:  81 year old female with recent ESBL Escherichia coli UTI recently completed IV antibiotics who presents today for further workup of recurrent highly resistant infections. Patient was straight catheterized for a clean specimen today to assess whether or not she is cleared the infection. She may need to resume IV antibiotics pending these results as UA is suspicious for recurrence and she is symptomatic.  1. UTI (lower urinary tract infection) Follow up urine culture results today, we'll treat as needed. - Urinalysis, Complete - Bladder Scan (Post Void Residual) in office - CULTURE, URINE COMPREHENSIVE  2. Recurrent UTI Past ruling out nidus of infection including stones and other anatomic abnormalities. She did have a CT scan from April 2016 which showed no obvious stones although there was some delayed contrast in the collecting system and therefore stones could not be completely ruled out. I have her to start periurethral ashen cream 3 nights a week and we discussed how to apply a pea-sized amount per urethra this is been proven to reduce the incidence of infections in postmenopausal women.. Samples were given today.  It also like her to start cranberry tabs twice a day. - Estrogen cream W-W-F -cranberry tabs bid -f/u in 3 months, consider repeat KUB to r/o stone, pelvic, cysto, etc. Pending symptoms   3. Urinary incontinence, unspecified incontinence type Stable, chronic.  PVR today reasonable therefore no concern for overflow.  Do no recommend anticholergics given patient's age,  comborbidities.  4. History of kidney stones Consider KUB in future, no obvious stones on CT abd/ pelvis 05/2014.  Return in about 3 months (around 12/23/2014) for consider cysto, pelvic exam, symptom recheck.  Vanna Scotland, MD  [facility] [address] [place], [place] [address] [phone]

## 2014-09-22 NOTE — Progress Notes (Signed)
In and Out Catheterization  Patient is present today for a I & O catheterization due to recurrent UTI. Patient was cleaned and prepped in a sterile fashion with betadine and Lidocaine 2% jelly was instilled into the urethra.  A 8 FR cath was inserted no complications were noted , 90 ml of urine return was noted, urine was cloudy yellow in color. A clean urine sample was collected for urinalysis. Bladder was drained  And catheter was removed with out difficulty.    Preformed by: Burt Knack

## 2014-09-24 ENCOUNTER — Encounter: Payer: Self-pay | Admitting: Urology

## 2014-09-24 LAB — CULTURE, URINE COMPREHENSIVE

## 2014-09-25 ENCOUNTER — Telehealth: Payer: Self-pay | Admitting: Family Medicine

## 2014-09-25 ENCOUNTER — Telehealth: Payer: Self-pay

## 2014-09-25 NOTE — Telephone Encounter (Signed)
Cindy from [place], pt home health agency, called stating pt currently has a PICC line. If IV abt are needed home health can administer medication. Orders just need to be faxed. Please advise.

## 2014-09-25 NOTE — Telephone Encounter (Signed)
-----   Message from Ashley Brandon, MD sent at 09/25/2014 11:14 AM EDT ----- This lady still has ESBL E. Coli.  She was seen by Dr. Fitzgerald as an inpatient and still has a PICC line.  Please call his office to inform him and have then advise/ arrange for another round of IV abx.    Ashley Brandon, MD  

## 2014-09-25 NOTE — Telephone Encounter (Signed)
FYI

## 2014-09-25 NOTE — Telephone Encounter (Signed)
-----   Message from Vanna Scotland, MD sent at 09/25/2014 11:14 AM EDT ----- This lady still has ESBL E. Coli.  She was seen by Dr. Sampson Goon as an inpatient and still has a PICC line.  Please call his office to inform him and have then advise/ arrange for another round of IV abx.    Vanna Scotland, MD

## 2014-09-25 NOTE — Telephone Encounter (Signed)
Trey Paula call back states that he did not  Remove the pick line will wait on results Trey Paula call back # is  [phone]

## 2014-09-25 NOTE — Telephone Encounter (Signed)
Called Dr. Sampson Goon office and [facility] for nurse to return call

## 2014-09-25 NOTE — Telephone Encounter (Signed)
Spoke with Merry Proud, Dr. Myrtis Hopping nurse, who stated pt has never been seen in their office. Therefore she needs to get approval from Dr. Sampson Goon before taking on pt. Gearldine Bienenstock will call back tomorrow.

## 2014-09-25 NOTE — Telephone Encounter (Signed)
Spoke with [name]. Preliminary UC results sent to me look like the same bug. I recommended calling [facility] to discuss if they want home health abx done again. Contact information given. Asked him to let me know if he needed PICC line maintenance orders from this office.

## 2014-09-25 NOTE — Telephone Encounter (Signed)
See phone call from 1034 am. Trey Paula did not her PICC line. Thanks! AK

## 2014-09-25 NOTE — Telephone Encounter (Signed)
Trey Paula called from [facility], states that  He was removing her  Pick line today Trey Paula call back # is  [phone]. Thanks

## 2014-09-26 NOTE — Telephone Encounter (Signed)
Spoke with Gearldine Bienenstock who stated pt has an appt for 09/27/14 at 8:30am with Dr. Sampson Goon. Brandy also requested fasomycin be added to urine cx sensitivities. Nurse called micro at [facility] and added sensitivities to urine cx.  Spoke with pt and made aware of appt with Dr. Sampson Goon. Nurse reinforced the importance of keeping the appt. Pt voiced understanding.

## 2014-09-27 ENCOUNTER — Telehealth: Payer: Self-pay | Admitting: Family Medicine

## 2014-09-27 NOTE — Telephone Encounter (Signed)
Larita Fife from [facility] needs the most recent office note for pt faxed to [phone].

## 2014-09-27 NOTE — Telephone Encounter (Signed)
Faxed

## 2014-09-28 ENCOUNTER — Telehealth: Payer: Self-pay | Admitting: Family Medicine

## 2014-09-28 ENCOUNTER — Encounter: Payer: Medicare Other | Admitting: Cardiovascular Disease

## 2014-09-28 ENCOUNTER — Encounter: Payer: Self-pay | Admitting: *Deleted

## 2014-09-28 NOTE — Telephone Encounter (Signed)
Cindy from [facility] wanted you to know pt started IV antibiotics again.  She is faxing over the information.  Her call back number is [phone]

## 2014-09-29 ENCOUNTER — Emergency Department: Payer: Medicare Other

## 2014-09-29 ENCOUNTER — Encounter: Payer: Self-pay | Admitting: Emergency Medicine

## 2014-09-29 ENCOUNTER — Observation Stay
Admission: EM | Admit: 2014-09-29 | Discharge: 2014-09-30 | Disposition: A | Discharge status: Home / Self Care | Payer: Medicare Other | Attending: Internal Medicine | Admitting: Internal Medicine

## 2014-09-29 DIAGNOSIS — G8929 Other chronic pain: Secondary | ICD-10-CM | POA: Diagnosis not present

## 2014-09-29 DIAGNOSIS — J189 Pneumonia, unspecified organism: Secondary | ICD-10-CM | POA: Insufficient documentation

## 2014-09-29 DIAGNOSIS — R142 Eructation: Secondary | ICD-10-CM

## 2014-09-29 DIAGNOSIS — N183 Chronic kidney disease, stage 3 (moderate): Secondary | ICD-10-CM | POA: Insufficient documentation

## 2014-09-29 DIAGNOSIS — M1610 Unilateral primary osteoarthritis, unspecified hip: Secondary | ICD-10-CM | POA: Insufficient documentation

## 2014-09-29 DIAGNOSIS — Z87891 Personal history of nicotine dependence: Secondary | ICD-10-CM | POA: Diagnosis not present

## 2014-09-29 DIAGNOSIS — I130 Hypertensive heart and chronic kidney disease with heart failure and stage 1 through stage 4 chronic kidney disease, or unspecified chronic kidney disease: Secondary | ICD-10-CM | POA: Insufficient documentation

## 2014-09-29 DIAGNOSIS — I480 Paroxysmal atrial fibrillation: Secondary | ICD-10-CM | POA: Insufficient documentation

## 2014-09-29 DIAGNOSIS — I5022 Chronic systolic (congestive) heart failure: Secondary | ICD-10-CM | POA: Insufficient documentation

## 2014-09-29 DIAGNOSIS — Z885 Allergy status to narcotic agent status: Secondary | ICD-10-CM | POA: Diagnosis not present

## 2014-09-29 DIAGNOSIS — R079 Chest pain, unspecified: Secondary | ICD-10-CM | POA: Diagnosis present

## 2014-09-29 DIAGNOSIS — K567 Ileus, unspecified: Secondary | ICD-10-CM | POA: Diagnosis present

## 2014-09-29 DIAGNOSIS — E1122 Type 2 diabetes mellitus with diabetic chronic kidney disease: Secondary | ICD-10-CM | POA: Diagnosis not present

## 2014-09-29 DIAGNOSIS — E785 Hyperlipidemia, unspecified: Secondary | ICD-10-CM | POA: Insufficient documentation

## 2014-09-29 DIAGNOSIS — Z91041 Radiographic dye allergy status: Secondary | ICD-10-CM | POA: Diagnosis not present

## 2014-09-29 DIAGNOSIS — Z880 Allergy status to penicillin: Secondary | ICD-10-CM | POA: Diagnosis not present

## 2014-09-29 DIAGNOSIS — I739 Peripheral vascular disease, unspecified: Secondary | ICD-10-CM | POA: Diagnosis not present

## 2014-09-29 DIAGNOSIS — Z90710 Acquired absence of both cervix and uterus: Secondary | ICD-10-CM | POA: Diagnosis not present

## 2014-09-29 DIAGNOSIS — Z1612 Extended spectrum beta lactamase (ESBL) resistance: Secondary | ICD-10-CM | POA: Diagnosis not present

## 2014-09-29 DIAGNOSIS — Z833 Family history of diabetes mellitus: Secondary | ICD-10-CM | POA: Insufficient documentation

## 2014-09-29 DIAGNOSIS — D649 Anemia, unspecified: Secondary | ICD-10-CM | POA: Insufficient documentation

## 2014-09-29 DIAGNOSIS — R748 Abnormal levels of other serum enzymes: Secondary | ICD-10-CM | POA: Insufficient documentation

## 2014-09-29 DIAGNOSIS — R110 Nausea: Secondary | ICD-10-CM | POA: Diagnosis not present

## 2014-09-29 DIAGNOSIS — Z9049 Acquired absence of other specified parts of digestive tract: Secondary | ICD-10-CM | POA: Insufficient documentation

## 2014-09-29 DIAGNOSIS — J449 Chronic obstructive pulmonary disease, unspecified: Secondary | ICD-10-CM | POA: Diagnosis not present

## 2014-09-29 DIAGNOSIS — Z96641 Presence of right artificial hip joint: Secondary | ICD-10-CM | POA: Diagnosis not present

## 2014-09-29 DIAGNOSIS — N390 Urinary tract infection, site not specified: Secondary | ICD-10-CM | POA: Diagnosis not present

## 2014-09-29 DIAGNOSIS — L8990 Pressure ulcer of unspecified site, unspecified stage: Secondary | ICD-10-CM | POA: Diagnosis present

## 2014-09-29 DIAGNOSIS — M1612 Unilateral primary osteoarthritis, left hip: Secondary | ICD-10-CM | POA: Insufficient documentation

## 2014-09-29 DIAGNOSIS — Z794 Long term (current) use of insulin: Secondary | ICD-10-CM | POA: Insufficient documentation

## 2014-09-29 DIAGNOSIS — R072 Precordial pain: Secondary | ICD-10-CM | POA: Diagnosis not present

## 2014-09-29 DIAGNOSIS — I252 Old myocardial infarction: Secondary | ICD-10-CM | POA: Diagnosis not present

## 2014-09-29 DIAGNOSIS — I2510 Atherosclerotic heart disease of native coronary artery without angina pectoris: Secondary | ICD-10-CM | POA: Diagnosis not present

## 2014-09-29 DIAGNOSIS — K5900 Constipation, unspecified: Secondary | ICD-10-CM | POA: Insufficient documentation

## 2014-09-29 DIAGNOSIS — Z951 Presence of aortocoronary bypass graft: Secondary | ICD-10-CM | POA: Insufficient documentation

## 2014-09-29 DIAGNOSIS — B9620 Unspecified Escherichia coli [E. coli] as the cause of diseases classified elsewhere: Secondary | ICD-10-CM | POA: Insufficient documentation

## 2014-09-29 DIAGNOSIS — Y95 Nosocomial condition: Secondary | ICD-10-CM | POA: Diagnosis not present

## 2014-09-29 DIAGNOSIS — Z79899 Other long term (current) drug therapy: Secondary | ICD-10-CM | POA: Insufficient documentation

## 2014-09-29 DIAGNOSIS — R0789 Other chest pain: Secondary | ICD-10-CM | POA: Diagnosis not present

## 2014-09-29 DIAGNOSIS — R918 Other nonspecific abnormal finding of lung field: Secondary | ICD-10-CM | POA: Diagnosis not present

## 2014-09-29 DIAGNOSIS — Z8249 Family history of ischemic heart disease and other diseases of the circulatory system: Secondary | ICD-10-CM | POA: Insufficient documentation

## 2014-09-29 LAB — LIPASE, BLOOD: Lipase: 17 U/L — ABNORMAL LOW (ref 22–51)

## 2014-09-29 LAB — COMPREHENSIVE METABOLIC PANEL
ALK PHOS: 71 U/L (ref 38–126)
ALT: 7 U/L — ABNORMAL LOW (ref 14–54)
AST: 19 U/L (ref 15–41)
Albumin: 3.3 g/dL — ABNORMAL LOW (ref 3.5–5.0)
Anion gap: 11 (ref 5–15)
BILIRUBIN TOTAL: 0.4 mg/dL (ref 0.3–1.2)
BUN: 29 mg/dL — ABNORMAL HIGH (ref 6–20)
CHLORIDE: 102 mmol/L (ref 101–111)
CO2: 26 mmol/L (ref 22–32)
CREATININE: 1.81 mg/dL — AB (ref 0.44–1.00)
Calcium: 8.9 mg/dL (ref 8.9–10.3)
GFR, EST AFRICAN AMERICAN: 29 mL/min — AB (ref >60)
GFR, EST NON AFRICAN AMERICAN: 25 mL/min — AB (ref >60)
GLUCOSE: 246 mg/dL — AB (ref 65–99)
POTASSIUM: 4 mmol/L (ref 3.5–5.1)
Sodium: 139 mmol/L (ref 135–145)
Total Protein: 6.3 g/dL — ABNORMAL LOW (ref 6.5–8.1)

## 2014-09-29 LAB — CBC
HEMATOCRIT: 28.5 % — AB (ref 35.0–47.0)
Hemoglobin: 8.7 g/dL — ABNORMAL LOW (ref 12.0–16.0)
MCH: 24.1 pg — ABNORMAL LOW (ref 26.0–34.0)
MCHC: 30.5 g/dL — AB (ref 32.0–36.0)
MCV: 78.9 fL — AB (ref 80.0–100.0)
Platelets: 196 10*3/uL (ref 150–440)
RBC: 3.62 MIL/uL — ABNORMAL LOW (ref 3.80–5.20)
RDW: 19 % — AB (ref 11.5–14.5)
WBC: 5.1 10*3/uL (ref 3.6–11.0)

## 2014-09-29 LAB — GLUCOSE, CAPILLARY
GLUCOSE-CAPILLARY: 183 mg/dL — AB (ref 65–99)
GLUCOSE-CAPILLARY: 173 mg/dL — AB (ref 65–99)
GLUCOSE-CAPILLARY: 149 mg/dL — AB (ref 65–99)
Glucose-Capillary: 145 mg/dL — ABNORMAL HIGH (ref 65–99)

## 2014-09-29 LAB — TROPONIN I
TROPONIN I: 0.09 ng/mL — AB (ref <0.031)
TROPONIN I: 0.18 ng/mL — AB (ref <0.031)
Troponin I: 0.1 ng/mL — ABNORMAL HIGH (ref <0.031)

## 2014-09-29 MED ORDER — ONDANSETRON HCL 4 MG/2ML IJ SOLN: 4.0000 mg | Freq: Four times a day (QID) | INTRAMUSCULAR | Status: DC | PRN

## 2014-09-29 MED ORDER — HYDROCODONE-ACETAMINOPHEN 5-325 MG PO TABS
1.0000 | ORAL_TABLET | Freq: Two times a day (BID) | ORAL | Status: DC | PRN
  Administered 2014-09-30: 1 via ORAL
  Filled 2014-09-29: qty 1

## 2014-09-29 MED ORDER — ASPIRIN 81 MG PO CHEW
162.0000 mg | CHEWABLE_TABLET | Freq: Once | ORAL | Status: AC
  Administered 2014-09-29: 162 mg via ORAL
  Filled 2014-09-29: qty 2

## 2014-09-29 MED ORDER — INSULIN GLARGINE 100 UNIT/ML ~~LOC~~ SOLN
15.0000 [IU] | Freq: Every day | SUBCUTANEOUS | Status: DC
  Administered 2014-09-29: 7 [IU] via SUBCUTANEOUS
  Filled 2014-09-29 (×2): qty 0.15

## 2014-09-29 MED ORDER — ERTAPENEM SODIUM 1 G IJ SOLR: 1.0000 g | INTRAMUSCULAR | Status: DC

## 2014-09-29 MED ORDER — SODIUM CHLORIDE 0.9 % IV SOLN
INTRAVENOUS | Status: DC
  Administered 2014-09-29: 13:00:00 via INTRAVENOUS

## 2014-09-29 MED ORDER — INSULIN ASPART 100 UNIT/ML ~~LOC~~ SOLN
0.0000 [IU] | SUBCUTANEOUS | Status: DC
  Administered 2014-09-29 (×2): 2 [IU] via SUBCUTANEOUS
  Administered 2014-09-29 – 2014-09-30 (×4): 1 [IU] via SUBCUTANEOUS
  Filled 2014-09-29: qty 2
  Filled 2014-09-29 (×3): qty 1
  Filled 2014-09-29 (×2): qty 2
  Filled 2014-09-29: qty 1

## 2014-09-29 MED ORDER — PANTOPRAZOLE SODIUM 40 MG PO TBEC
40.0000 mg | DELAYED_RELEASE_TABLET | Freq: Every day | ORAL | Status: DC
  Administered 2014-09-29 – 2014-09-30 (×2): 40 mg via ORAL
  Filled 2014-09-29 (×2): qty 1

## 2014-09-29 MED ORDER — APIXABAN 2.5 MG PO TABS
2.5000 mg | ORAL_TABLET | Freq: Two times a day (BID) | ORAL | Status: DC
  Administered 2014-09-29 – 2014-09-30 (×3): 2.5 mg via ORAL
  Filled 2014-09-29 (×3): qty 1

## 2014-09-29 MED ORDER — POTASSIUM CHLORIDE ER 10 MEQ PO TBCR
10.0000 meq | EXTENDED_RELEASE_TABLET | Freq: Every day | ORAL | Status: DC
  Administered 2014-09-29 – 2014-09-30 (×2): 10 meq via ORAL
  Filled 2014-09-29 (×4): qty 1

## 2014-09-29 MED ORDER — CARVEDILOL 3.125 MG PO TABS
3.1250 mg | ORAL_TABLET | Freq: Two times a day (BID) | ORAL | Status: DC
  Administered 2014-09-29 – 2014-09-30 (×3): 3.125 mg via ORAL
  Filled 2014-09-29 (×3): qty 1

## 2014-09-29 MED ORDER — NYSTATIN 100000 UNIT/GM EX CREA
1.0000 "application " | TOPICAL_CREAM | Freq: Two times a day (BID) | CUTANEOUS | Status: DC
  Administered 2014-09-29 – 2014-09-30 (×2): 1 via TOPICAL
  Filled 2014-09-29: qty 15

## 2014-09-29 MED ORDER — ENOXAPARIN SODIUM 30 MG/0.3ML ~~LOC~~ SOLN
30.0000 mg | SUBCUTANEOUS | Status: DC
  Administered 2014-09-29: 30 mg via SUBCUTANEOUS
  Filled 2014-09-29: qty 0.3

## 2014-09-29 MED ORDER — TORSEMIDE 20 MG PO TABS
20.0000 mg | ORAL_TABLET | Freq: Every day | ORAL | Status: DC
  Administered 2014-09-29 – 2014-09-30 (×2): 20 mg via ORAL
  Filled 2014-09-29 (×2): qty 1

## 2014-09-29 MED ORDER — DOCUSATE SODIUM 100 MG PO CAPS
100.0000 mg | ORAL_CAPSULE | Freq: Two times a day (BID) | ORAL | Status: DC
  Administered 2014-09-29 – 2014-09-30 (×3): 100 mg via ORAL
  Filled 2014-09-29 (×3): qty 1

## 2014-09-29 MED ORDER — ASPIRIN EC 81 MG PO TBEC
81.0000 mg | DELAYED_RELEASE_TABLET | Freq: Every day | ORAL | Status: DC
  Administered 2014-09-30: 81 mg via ORAL
  Filled 2014-09-29: qty 1

## 2014-09-29 MED ORDER — ALBUTEROL SULFATE (2.5 MG/3ML) 0.083% IN NEBU: 2.5000 mg | INHALATION_SOLUTION | RESPIRATORY_TRACT | Status: DC | PRN

## 2014-09-29 MED ORDER — SODIUM CHLORIDE 0.9 % IV BOLUS (SEPSIS)
500.0000 mL | Freq: Once | INTRAVENOUS | Status: AC
  Administered 2014-09-29: 500 mL via INTRAVENOUS

## 2014-09-29 MED ORDER — AMIODARONE HCL 200 MG PO TABS
200.0000 mg | ORAL_TABLET | Freq: Every day | ORAL | Status: DC
  Administered 2014-09-29 – 2014-09-30 (×2): 200 mg via ORAL
  Filled 2014-09-29 (×2): qty 1

## 2014-09-29 MED ORDER — PRAVASTATIN SODIUM 20 MG PO TABS
40.0000 mg | ORAL_TABLET | Freq: Every day | ORAL | Status: DC
  Administered 2014-09-29 – 2014-09-30 (×2): 40 mg via ORAL
  Filled 2014-09-29 (×2): qty 2

## 2014-09-29 MED ORDER — ISOSORBIDE MONONITRATE ER 30 MG PO TB24
90.0000 mg | ORAL_TABLET | Freq: Every day | ORAL | Status: DC
  Administered 2014-09-29 – 2014-09-30 (×2): 90 mg via ORAL
  Filled 2014-09-29 (×2): qty 1

## 2014-09-29 MED ORDER — BISACODYL 10 MG RE SUPP: 10.0000 mg | Freq: Every day | RECTAL | Status: DC | PRN

## 2014-09-29 MED ORDER — MORPHINE SULFATE 2 MG/ML IJ SOLN
2.0000 mg | Freq: Once | INTRAMUSCULAR | Status: AC
  Administered 2014-09-29: 2 mg via INTRAVENOUS
  Filled 2014-09-29: qty 1

## 2014-09-29 MED ORDER — FLEET ENEMA 7-19 GM/118ML RE ENEM: 1.0000 | ENEMA | Freq: Once | RECTAL | Status: DC | PRN

## 2014-09-29 MED ORDER — BISACODYL 10 MG RE SUPP
10.0000 mg | Freq: Once | RECTAL | Status: AC
  Administered 2014-09-29: 10 mg via RECTAL
  Filled 2014-09-29: qty 1

## 2014-09-29 MED ORDER — SODIUM CHLORIDE 0.9 % IJ SOLN
3.0000 mL | Freq: Two times a day (BID) | INTRAMUSCULAR | Status: DC
  Administered 2014-09-29 – 2014-09-30 (×3): 3 mL via INTRAVENOUS

## 2014-09-29 MED ORDER — GABAPENTIN 400 MG PO CAPS
400.0000 mg | ORAL_CAPSULE | Freq: Three times a day (TID) | ORAL | Status: DC
  Administered 2014-09-29 – 2014-09-30 (×4): 400 mg via ORAL
  Filled 2014-09-29 (×4): qty 1

## 2014-09-29 MED ORDER — LEVOTHYROXINE SODIUM 100 MCG PO TABS
100.0000 ug | ORAL_TABLET | Freq: Every day | ORAL | Status: DC
  Administered 2014-09-29 – 2014-09-30 (×2): 100 ug via ORAL
  Filled 2014-09-29 (×2): qty 1

## 2014-09-29 MED ORDER — NITROGLYCERIN 0.4 MG SL SUBL: 0.4000 mg | SUBLINGUAL_TABLET | SUBLINGUAL | Status: DC | PRN

## 2014-09-29 MED ORDER — ONDANSETRON HCL 4 MG PO TABS: 4.0000 mg | ORAL_TABLET | Freq: Four times a day (QID) | ORAL | Status: DC | PRN

## 2014-09-29 MED ORDER — PROMETHAZINE HCL 25 MG/ML IJ SOLN
6.2500 mg | Freq: Once | INTRAMUSCULAR | Status: AC
  Administered 2014-09-29: 6.25 mg via INTRAVENOUS
  Filled 2014-09-29: qty 1

## 2014-09-29 MED ORDER — SODIUM CHLORIDE 0.9 % IV SOLN
1.0000 g | INTRAVENOUS | Status: DC
  Administered 2014-09-29 – 2014-09-30 (×2): 1 g via INTRAVENOUS
  Filled 2014-09-29 (×3): qty 1

## 2014-09-29 NOTE — ED Notes (Signed)
Pt comes into the ED via EMS c/o chest tightness and nausea.  Patient denies chest pain.  4 mg Zofran given in the field.  Patient has PICC line and is being treated with IV antibiotics for UTI.  Patient takes Eliquis at home.  138/78, 247 CBG, NSR on EKG.  H/o HTN, CHF, diabetes, and hyperlipidemia.

## 2014-09-29 NOTE — ED Provider Notes (Signed)
[facility] Emergency Department Provider Note  ____________________________________________  Time seen: Approximately 9:00 AM  I have reviewed the triage vital signs and the nursing notes.   HISTORY  Chief Complaint Chest Pain and Nausea    HPI [name] is a 81 y.o. female is experiencing a feeling of nausea, frequent burping, had tightness across her upper abdomen and lower chest for the past 24 hours. Patient reports she is not vomiting, but feels very tight and like she cannot stop burping. She reports belching frequently. She does feel a little tight across her upper abdomen and also in her upper chest. Not radiating.  Patient states she is currently on antibiotic's for UTI treatment. Notably, the patient is on Eliquis. She doesn't along history of coronary disease. She history of congestive heart failure.  Describes a mild to moderate discomfort in the upper abdomen and chest. Nonradiating. Associated nausea without vomiting. No diaphoresis. No fevers or chills. States she just feels "so miserable".   Past Medical History  Diagnosis Date  . DM2 (diabetes mellitus, type 2)     onset age 47 insulin dependent  . Hyperlipidemia   . COPD (chronic obstructive pulmonary disease)   . Peripheral vascular disease   . CAD (coronary artery disease)     a. MI 1988 s/p CABG in 1988, multiple PCI on SVGs; b. cath 2012: occluded native coronary arteries, patent LIMA to LAD (distal LAD dz), patent SVG-OM & occluded SVG-RCA which filled via left to right collats; c. cath 12/2013 patent LIMA-LAD & SVG-O. known occluded VG-RCA w/ L-R collats, no change since cath 2012  . Chronic systolic CHF (congestive heart failure)     a. 03/2012: EF 40-45%, mild lateral wall HK, mod inf HK, mod post wall HK, mild LVH, mild MR/TR   . Paroxysmal atrial fibrillation     a. CHADSVASc 7: yearly risk of CVA 9.6%;. b. on Eliquis 2.5 mg bid (age, SCr, borderline wt 62.9 Kg)   . HTN  (hypertension)   . Yeast infection   . UTI (urinary tract infection)   . Atherosclerosis of native artery of left lower extremity   . Myocardial infarction     Patient Active Problem List   Diagnosis Date Noted  . Chronic left hip pain 09/12/2014  . Healthcare-associated pneumonia 08/31/2014  . UTI (lower urinary tract infection) 08/11/2014  . Anemia 07/25/2014  . NSTEMI, initial episode of care 06/17/2014  . Degenerative arthritis of hip 04/11/2014  . Pre-operative cardiovascular examination 02/27/2014  . COPD (chronic obstructive pulmonary disease)   . DM2 (diabetes mellitus, type 2)   . Hyperlipidemia   . Chest pain 12/21/2013  . Femoral distal fracture 07/28/2013  . Chronic systolic heart failure 05/03/2013  . Congestive heart failure 04/08/2013  . CAD (coronary artery disease)   . Peripheral vascular disease   . Paroxysmal atrial fibrillation   . HTN (hypertension)   . Atherosclerosis of native arteries of the extremities with intermittent claudication 03/19/2011    Past Surgical History  Procedure Laterality Date  . Abdominal hysterectomy    . Coronary artery bypass graft    . Appendectomy    . Cholecystectomy    . Ovary surgery    . Angioplasty / stenting femoral      Bilateral SFA stenting  . Femur surgery    . Cardiac catheterization      [facility]  . Cardiac catheterization  12/2013    Current Outpatient Rx  Name  Route  Sig  Dispense  Refill  . albuterol (PROVENTIL) (2.5 MG/3ML) 0.083% nebulizer solution   Nebulization   Take 3 mLs (2.5 mg total) by nebulization every 6 (six) hours as needed for wheezing or shortness of breath.   150 mL   1   . amiodarone (PACERONE) 200 MG tablet   Oral   Take 1 tablet (200 mg total) by mouth daily.   90 tablet   3   . apixaban (ELIQUIS) 2.5 MG TABS tablet   Oral   Take 1 tablet (2.5 mg total) by mouth 2 (two) times daily.   60 tablet   6   . benazepril (LOTENSIN) 20 MG tablet   Oral   Take 20 mg by mouth  daily.         . carvedilol (COREG) 3.125 MG tablet   Oral   Take 1 tablet (3.125 mg total) by mouth 2 (two) times daily.   180 tablet   3   . gabapentin (NEURONTIN) 400 MG capsule   Oral   Take 400 mg by mouth 3 (three) times daily.         [name] HYDROcodone-acetaminophen (NORCO/VICODIN) 5-325 MG per tablet   Oral   Take 1-2 tablets by mouth 2 (two) times daily as needed for moderate pain.   30 tablet   0   . insulin lispro (HUMALOG) 100 UNIT/ML injection   Subcutaneous   Inject 0.04 mLs (4 Units total) into the skin 3 (three) times daily before meals. Patient taking differently: Inject 10 Units into the skin 3 (three) times daily before meals.    10 mL   11   . INVANZ 1 G injection                 Dispense as written.   . isosorbide mononitrate (IMDUR) 60 MG 24 hr tablet   Oral   Take 90 mg by mouth daily.          [name] LANTUS SOLOSTAR 100 UNIT/ML Solostar Pen                 Dispense as written.   [name] levothyroxine (SYNTHROID, LEVOTHROID) 100 MCG tablet   Oral   Take 100 mcg by mouth daily.          . nitroGLYCERIN (NITROSTAT) 0.4 MG SL tablet   Sublingual   Place 1 tablet (0.4 mg total) under the tongue every 5 (five) minutes as needed for chest pain.   30 tablet   0   . nystatin cream (MYCOSTATIN)   Topical   Apply 1 application topically 2 (two) times daily.   30 g   1   . potassium chloride (K-DUR) 10 MEQ tablet   Oral   Take 10 mEq by mouth daily.          . pravastatin (PRAVACHOL) 40 MG tablet   Oral   Take 40 mg by mouth daily.         [name] torsemide (DEMADEX) 20 MG tablet      Take 40 mg in the morning and 20 mg in the afternoon.   180 tablet   3     Allergies Contrast media; Tramadol; Valium; Iodine strong; and Penicillins  Family History  Problem Relation Age of Onset  . Diabetes Mother   . Heart disease Mother   . Hypertension Mother   . Heart disease Father   . Heart disease Brother     Social History Social  History  Substance Use Topics  .  Smoking status: Former Smoker -- 15 years    Types: Cigarettes    Quit date: 02/18/1988  . Smokeless tobacco: Never Used  . Alcohol Use: No    Review of Systems Constitutional: No fever/chills Eyes: No visual changes. ENT: No sore throat. Cardiovascular: See history of present illness Respiratory: Denies shortness of breath. Gastrointestinal: See history of present illness no vomiting.  No diarrhea.  Some constipation. Genitourinary: Negative for dysuria. Musculoskeletal: Negative for back pain. Skin: Negative for rash. Neurological: Negative for headaches, focal weakness or numbness.  10-point ROS otherwise negative.  ____________________________________________   PHYSICAL EXAM:  VITAL SIGNS: ED Triage Vitals  Enc Vitals Group     BP 09/29/14 0813 134/77 mmHg     Pulse Rate 09/29/14 0813 85     Resp 09/29/14 0813 20     Temp 09/29/14 0813 98.5 F (36.9 C)     Temp Source 09/29/14 0813 Oral     SpO2 09/29/14 0813 100 %     Weight 09/29/14 0813 140 lb (63.504 kg)     Height 09/29/14 0813 5' 1.5" (1.562 m)     Head Cir --      Peak Flow --      Pain Score 09/29/14 0813 10     Pain Loc --      Pain Edu? --      Excl. in GC? --     Constitutional: Alert and oriented. Well appearing and didn't somewhat fatigued, frequently belching. Eyes: Conjunctivae are normal. PERRL. EOMI. Head: Atraumatic. Nose: No congestion/rhinnorhea. Mouth/Throat: Mucous membranes are lightly dry.  Oropharynx non-erythematous. Neck: No stridor.   Cardiovascular: Normal rate, regular rhythm. Grossly normal heart sounds.  Good peripheral circulation in the hands, both feet and legs are slightly cool and patient reports she has terrible circulation at baseline. Respiratory: Normal respiratory effort.  No retractions. Lungs CTAB sept for diminished lung sounds in the lower lobes bilaterally. Gastrointestinal: Soft and nontender. No distention. No abdominal  bruits. No CVA tenderness. No reproducible abdominal tenderness. Musculoskeletal: Right lower leg surgically scarred 1+ extremity edema. Left lower leg with proximally with 1+ lower extremity edema. No joint effusions. No venous cords. No thigh tenderness. Neurologic:  Normal speech and language. No gross focal neurologic deficits are appreciated. Skin:  Skin is warm, dry and intact. No rash noted. Psychiatric: Mood and affect are normal. Speech and behavior are normal.  ____________________________________________   LABS (all labs ordered are listed, but only abnormal results are displayed)  Labs Reviewed  CBC - Abnormal; Notable for the following:    RBC 3.62 (*)    Hemoglobin 8.7 (*)    HCT 28.5 (*)    MCV 78.9 (*)    MCH 24.1 (*)    MCHC 30.5 (*)    RDW 19.0 (*)    All other components within normal limits  COMPREHENSIVE METABOLIC PANEL - Abnormal; Notable for the following:    Glucose, Bld 246 (*)    BUN 29 (*)    Creatinine, Ser 1.81 (*)    Total Protein 6.3 (*)    Albumin 3.3 (*)    ALT 7 (*)    GFR calc non Af Amer 25 (*)    GFR calc Af Amer 29 (*)    All other components within normal limits  LIPASE, BLOOD - Abnormal; Notable for the following:    Lipase 17 (*)    All other components within normal limits  TROPONIN I - Abnormal; Notable for the following:  Troponin I 0.09 (*)    All other components within normal limits  URINALYSIS COMPLETEWITH MICROSCOPIC ([facility] ONLY)   ____________________________________________  EKG  Reviewed and interpreted by me Ventricular rate 83 Normal sinus rhythm, right bundle branch block, lateral T-wave abnormality. Compared with previous EKGs, no significant changes found. QRS 166 QTC 560 ____________________________________________  RADIOLOGY  EXAM: CHEST - 2 VIEW; ABDOMEN - 2 VIEW  COMPARISON: Chest x-ray 09/03/2014  FINDINGS: Chest x-ray:  The right PICC line is stable. Stable cardiac enlargement and tortuous  ectatic and calcified thoracic aorta. There are chronic lung changes with possible superimposed interstitial edema or bronchitis. There is a persistent left lower lobe process which is likely a combination of effusion and atelectasis or infiltrate.  Two-view abdomen:  The abdominal bowel gas pattern suggests a mild diffuse ileus or possible gastroenteritis. There are air-filled small bowel loops without distention and some scattered air and stool throughout the colon. No free air. The soft tissue shadows are grossly maintained. Extensive vascular calcifications are noted. The bony structures are grossly intact. There is a right hip prosthesis and severe degenerative changes involving the left hip.  IMPRESSION: 1. Persistent left lower lobe process likely a combination of effusion, atelectasis and/or infiltrate. 2. Underline chronic lung changes. 3. Stable right PICC line. 4. Abdominal bowel gas pattern suggests a mild ileus or gastroenteritis. No findings for small bowel obstruction or free air.  ____________________________________________   PROCEDURES  Procedure(s) performed: None  Critical Care performed: No  ____________________________________________   INITIAL IMPRESSION / ASSESSMENT AND PLAN / ED COURSE  Pertinent labs & imaging results that were available during my care of the patient were reviewed by me and considered in my medical decision making (see chart for details).  This elderly patient with a history of coronary disease congestive heart failure, and previous bowel obstruction who presents today with somewhat unspecified belching and abdominal and chest tightness. Her examination is reassuring, but she is frequently belching. Differential diagnosis includes possibility of cardiac disease, including angina, coronary ischemia, but also considered would be bowel obstruction, or other etiology of belching including medication related.  We will evaluate with  chest x-ray, and abdominal films. Her abdomen is really nontender and not distended, which lowers my suspicion for acute bowel obstruction but I feel that abdominal x-rays are reasonable screening tool at this point. She does not have any tenderness in the right upper quadrant and is status post cholecystectomy and appendectomy per her report. Her EKG does not demonstrate acute change, but she is having some chest tightness. We will continue to await labs and x-rays imaging is evaluate her. We'll give her a small dose of Phenergan and morphine for discomfort and also to help alleviate some of her belching.  ----------------------------------------- 9:39 AM on 09/29/2014 -----------------------------------------  Patient presently reports some improvement in symptoms. In no distress. Review of labs indicate slightly elevated troponin, the patient does have a history of long-standing coronary disease and previous demand ischemia. Also her x-ray demonstrates a possible ileus. Based on her symptomatology, frequent belching, and feeling of constipation with no recent bowel movements I suspect she likely does have an ileus. No findings of bowel obstruction.  Because of elevated troponin, with history of chest tightness as well as a possible ileus I will admit the patient to the hospital service for ongoing treatment and monitoring. I will give the patient low dose of aspirin due to elevated troponin and possible demand ischemia. She is already on Eliquis.  Discussed with Dr.  Sherryll Burger of the hospital service. ____________________________________________   FINAL CLINICAL IMPRESSION(S) / ED DIAGNOSES  Final diagnoses:  Chest pain with moderate risk for cardiac etiology  Ileus  Belching      Sharyn Creamer, MD 09/29/14 [phone]

## 2014-09-29 NOTE — ED Notes (Signed)
MD at bedside. 

## 2014-09-29 NOTE — H&P (Signed)
[facility] - [place] at [facility]   PATIENT NAME: [name]    MR#:  [id number]  DATE OF BIRTH:  Feb 25, 1931  DATE OF ADMISSION:  09/29/2014  PRIMARY CARE PHYSICIAN: Fidel Levy, MD   REQUESTING/REFERRING PHYSICIAN: Dr. Fanny Bien  CHIEF COMPLAINT:   Chief Complaint  Patient presents with  . Chest Pain  . Nausea    HISTORY OF PRESENT ILLNESS:  [name]  is a 81 y.o. female with a known history of CAD, hypertension, diabetes, anemia presents to the emergency room complaining of belching , nausea, diffuse abdominal pain. No chest pain or shortness of breath. Patient has been constipated for 3 days now. Abdominal x-ray showed ileus. No obstruction. Troponin was found to be elevated at 0.09. EKG showed no acute changes. Patient was last seen in the hospital a month prior for urinary tract infection and had troponin of 1.74. Thought to be demand ischemia in setting of CKD by cardiology.  PAST MEDICAL HISTORY:   Past Medical History  Diagnosis Date  . DM2 (diabetes mellitus, type 2)     onset age 54 insulin dependent  . Hyperlipidemia   . COPD (chronic obstructive pulmonary disease)   . Peripheral vascular disease   . CAD (coronary artery disease)     a. MI 1988 s/p CABG in 1988, multiple PCI on SVGs; b. cath 2012: occluded native coronary arteries, patent LIMA to LAD (distal LAD dz), patent SVG-OM & occluded SVG-RCA which filled via left to right collats; c. cath 12/2013 patent LIMA-LAD & SVG-O. known occluded VG-RCA w/ L-R collats, no change since cath 2012  . Chronic systolic CHF (congestive heart failure)     a. 03/2012: EF 40-45%, mild lateral wall HK, mod inf HK, mod post wall HK, mild LVH, mild MR/TR   . Paroxysmal atrial fibrillation     a. CHADSVASc 7: yearly risk of CVA 9.6%;. b. on Eliquis 2.5 mg bid (age, SCr, borderline wt 62.9 Kg)   . HTN (hypertension)   . Yeast infection   . UTI (urinary tract infection)   . Atherosclerosis  of native artery of left lower extremity   . Myocardial infarction     PAST SURGICAL HISTORY:   Past Surgical History  Procedure Laterality Date  . Abdominal hysterectomy    . Coronary artery bypass graft    . Appendectomy    . Cholecystectomy    . Ovary surgery    . Angioplasty / stenting femoral      Bilateral SFA stenting  . Femur surgery    . Cardiac catheterization      mc  . Cardiac catheterization  12/2013    SOCIAL HISTORY:   Social History  Substance Use Topics  . Smoking status: Former Smoker -- 15 years    Types: Cigarettes    Quit date: 02/18/1988  . Smokeless tobacco: Never Used  . Alcohol Use: No    FAMILY HISTORY:   Family History  Problem Relation Age of Onset  . Diabetes Mother   . Heart disease Mother   . Hypertension Mother   . Heart disease Father   . Heart disease Brother     DRUG ALLERGIES:   Allergies  Allergen Reactions  . Contrast Media [Iodinated Diagnostic Agents] Shortness Of Breath  . Tramadol Nausea Only  . Valium [Diazepam] Other (See Comments)    Reaction:  Elevated heart rate  . Iodine Strong [Iodine] Rash  . Penicillins Rash    REVIEW OF SYSTEMS:  Review of Systems  Constitutional: Positive for malaise/fatigue. Negative for fever, chills and weight loss.  HENT: Negative for hearing loss and nosebleeds.   Eyes: Negative for blurred vision, double vision and pain.  Respiratory: Negative for cough, hemoptysis, sputum production, shortness of breath and wheezing.   Cardiovascular: Negative for chest pain, palpitations, orthopnea and leg swelling.  Gastrointestinal: Positive for nausea, vomiting, abdominal pain and constipation. Negative for diarrhea.  Genitourinary: Negative for dysuria and hematuria.  Musculoskeletal: Positive for back pain. Negative for myalgias and falls.  Skin: Negative for rash.  Neurological: Positive for weakness. Negative for dizziness, tremors, sensory change, speech change, focal weakness,  seizures and headaches.  Endo/Heme/Allergies: Does not bruise/bleed easily.  Psychiatric/Behavioral: Negative for depression and memory loss. The patient is not nervous/anxious.     MEDICATIONS AT HOME:   Prior to Admission medications   Medication Sig Start Date End Date Taking? Authorizing Provider  albuterol (PROVENTIL) (2.5 MG/3ML) 0.083% nebulizer solution Take 3 mLs (2.5 mg total) by nebulization every 6 (six) hours as needed for wheezing or shortness of breath. 09/12/14  Yes [name] Rusty Aus, NP  amiodarone (PACERONE) 200 MG tablet Take 1 tablet (200 mg total) by mouth daily. 02/15/14  Yes Iran Ouch, MD  apixaban (ELIQUIS) 2.5 MG TABS tablet Take 1 tablet (2.5 mg total) by mouth 2 (two) times daily. 05/08/14  Yes Antonieta Iba, MD  benazepril (LOTENSIN) 20 MG tablet Take 20 mg by mouth daily.   Yes Historical Provider, MD  carvedilol (COREG) 3.125 MG tablet Take 1 tablet (3.125 mg total) by mouth 2 (two) times daily. 02/15/14  Yes Iran Ouch, MD  gabapentin (NEURONTIN) 400 MG capsule Take 400 mg by mouth 3 (three) times daily.   Yes Historical Provider, MD  HYDROcodone-acetaminophen (NORCO/VICODIN) 5-325 MG per tablet Take 1-2 tablets by mouth 2 (two) times daily as needed for moderate pain. 09/12/14  Yes [name] Rusty Aus, NP  insulin lispro (HUMALOG) 100 UNIT/ML injection Inject 0.04 mLs (4 Units total) into the skin 3 (three) times daily before meals. 06/18/14  Yes Alford Highland, MD  [facility] 1 G injection Inject 1 g into the muscle daily.  08/14/14  Yes Historical Provider, MD  isosorbide mononitrate (IMDUR) 60 MG 24 hr tablet Take 90 mg by mouth daily.    Yes Historical Provider, MD  LANTUS SOLOSTAR 100 UNIT/ML Solostar Pen Inject 30 Units into the skin at bedtime.  09/15/14  Yes Historical Provider, MD  levothyroxine (SYNTHROID, LEVOTHROID) 100 MCG tablet Take 100 mcg by mouth daily.    Yes Historical Provider, MD  nitroGLYCERIN (NITROSTAT) 0.4 MG SL tablet Place 1 tablet  (0.4 mg total) under the tongue every 5 (five) minutes as needed for chest pain. 06/18/14  Yes Alford Highland, MD  nystatin cream (MYCOSTATIN) Apply 1 application topically 2 (two) times daily. 08/30/14  Yes [name] Rusty Aus, NP  pantoprazole (PROTONIX) 40 MG tablet Take 40 mg by mouth daily.   Yes Historical Provider, MD  potassium chloride (K-DUR) 10 MEQ tablet Take 10 mEq by mouth daily.    Yes Historical Provider, MD  pravastatin (PRAVACHOL) 40 MG tablet Take 40 mg by mouth daily.   Yes Historical Provider, MD  torsemide (DEMADEX) 20 MG tablet Take 40 mg in the morning and 20 mg in the afternoon. Patient taking differently: Take 20 mg by mouth 2 (two) times daily.  09/11/14  Yes Iran Ouch, MD      VITAL SIGNS:  Blood pressure 153/77, pulse  85, temperature 98.1 F (36.7 C), temperature source Oral, resp. rate 20, height 5' 1.5" (1.562 m), weight 63.504 kg (140 lb), SpO2 94 %.  PHYSICAL EXAMINATION:  Physical Exam  GENERAL:  81 y.o.-year-old patient lying in the bed with no acute distress.  EYES: Pupils equal, round, reactive to light and accommodation. No scleral icterus. Extraocular muscles intact.  HEENT: Head atraumatic, normocephalic. Oropharynx and nasopharynx clear. No oropharyngeal erythema, moist oral mucosa  NECK:  Supple, no jugular venous distention. No thyroid enlargement, no tenderness.  LUNGS: Normal breath sounds bilaterally, no wheezing, rales, rhonchi. No use of accessory muscles of respiration.  CARDIOVASCULAR: S1, S2 normal. No murmurs, rubs, or gallops.  ABDOMEN: Soft, , nondistended. Bowel sounds present. No organomegaly or mass. Diffuse tenderness. EXTREMITIES: No pedal edema, cyanosis, or clubbing. + 2 pedal & radial pulses b/l.   NEUROLOGIC: Cranial nerves II through XII are intact. No focal Motor or sensory deficits appreciated b/l PSYCHIATRIC: The patient is alert and oriented x 3. Good affect.  SKIN: No obvious rash, lesion, or ulcer.   LABORATORY PANEL:    CBC  Recent Labs Lab 09/29/14 0834  WBC 5.1  HGB 8.7*  HCT 28.5*  PLT 196   ------------------------------------------------------------------------------------------------------------------  Chemistries   Recent Labs Lab 09/29/14 0834  NA 139  K 4.0  CL 102  CO2 26  GLUCOSE 246*  BUN 29*  CREATININE 1.81*  CALCIUM 8.9  AST 19  ALT 7*  ALKPHOS 71  BILITOT 0.4   ------------------------------------------------------------------------------------------------------------------  Cardiac Enzymes  Recent Labs Lab 09/29/14 0834  TROPONINI 0.09*   ------------------------------------------------------------------------------------------------------------------  RADIOLOGY:  Dg Chest 2 View  09/29/2014   CLINICAL DATA:  Substernal chest pain and abdominal pain since yesterday.  EXAM: CHEST - 2 VIEW; ABDOMEN - 2 VIEW  COMPARISON:  Chest x-ray 09/03/2014  FINDINGS: Chest x-ray:  The right PICC line is stable. Stable cardiac enlargement and tortuous ectatic and calcified thoracic aorta. There are chronic lung changes with possible superimposed interstitial edema or bronchitis. There is a persistent left lower lobe process which is likely a combination of effusion and atelectasis or infiltrate.  Two-view abdomen:  The abdominal bowel gas pattern suggests a mild diffuse ileus or possible gastroenteritis. There are air-filled small bowel loops without distention and some scattered air and stool throughout the colon. No free air. The soft tissue shadows are grossly maintained. Extensive vascular calcifications are noted. The bony structures are grossly intact. There is a right hip prosthesis and severe degenerative changes involving the left hip.  IMPRESSION: 1. Persistent left lower lobe process likely a combination of effusion, atelectasis and/or infiltrate. 2. Underline chronic lung changes. 3. Stable right PICC line. 4. Abdominal bowel gas pattern suggests a mild ileus or  gastroenteritis. No findings for small bowel obstruction or free air.   Electronically Signed   By: Rudie Meyer M.D.   On: 09/29/2014 09:23   Dg Abd 2 Views  09/29/2014   CLINICAL DATA:  Substernal chest pain and abdominal pain since yesterday.  EXAM: CHEST - 2 VIEW; ABDOMEN - 2 VIEW  COMPARISON:  Chest x-ray 09/03/2014  FINDINGS: Chest x-ray:  The right PICC line is stable. Stable cardiac enlargement and tortuous ectatic and calcified thoracic aorta. There are chronic lung changes with possible superimposed interstitial edema or bronchitis. There is a persistent left lower lobe process which is likely a combination of effusion and atelectasis or infiltrate.  Two-view abdomen:  The abdominal bowel gas pattern suggests a mild diffuse ileus or possible  gastroenteritis. There are air-filled small bowel loops without distention and some scattered air and stool throughout the colon. No free air. The soft tissue shadows are grossly maintained. Extensive vascular calcifications are noted. The bony structures are grossly intact. There is a right hip prosthesis and severe degenerative changes involving the left hip.  IMPRESSION: 1. Persistent left lower lobe process likely a combination of effusion, atelectasis and/or infiltrate. 2. Underline chronic lung changes. 3. Stable right PICC line. 4. Abdominal bowel gas pattern suggests a mild ileus or gastroenteritis. No findings for small bowel obstruction or free air.   Electronically Signed   By: Rudie Meyer M.D.   On: 09/29/2014 09:23     IMPRESSION AND PLAN:   * Ileus Likely from constipation. Dulcolax suppository. Lactulose. Nothing by mouth. No signs of infection.  * Elevated troponin minimal and 0.09 She does seem to have chronic elevation of troponin. Chest pain. Check 2 more sets of troponin  * Hypertension and diabetes Continue home medications along with sliding scale insulin.  * ESBL Escherichia coli UTI Patient is on Invanz through a PICC  line as outpatient and will be continued while in the hospital.  * Anemia of chronic disease stable  * CKD 3 stable  * DVT prophylaxis with Lovenox   All the records are reviewed and case discussed with ED provider. Management plans discussed with the patient, family and they are in agreement.  CODE STATUS: FULL  TOTAL TIME TAKING CARE OF THIS PATIENT: 40 minutes.    Milagros Loll R M.D on 09/29/2014 at 1:14 PM  Between 7am to 6pm - Pager - [phone]  After 6pm go to [email or web address] - password [email or web address] [facility]  [place] [facility]  Office  [phone]  CC: Primary care physician; Fidel Levy, MD

## 2014-09-29 NOTE — Progress Notes (Signed)
Pt is NPO for procedure tomorrow. CBG is 149, Dr. Clint Guy contacted.  Per Dr. Clint Guy, Hold sliding scale insulin coverage, administer only 7 units of lantus, and give PO medications.

## 2014-09-30 ENCOUNTER — Observation Stay: Payer: Medicare Other

## 2014-09-30 DIAGNOSIS — L8990 Pressure ulcer of unspecified site, unspecified stage: Secondary | ICD-10-CM | POA: Diagnosis present

## 2014-09-30 LAB — GLUCOSE, CAPILLARY
GLUCOSE-CAPILLARY: 166 mg/dL — AB (ref 65–99)
Glucose-Capillary: 142 mg/dL — ABNORMAL HIGH (ref 65–99)
Glucose-Capillary: 129 mg/dL — ABNORMAL HIGH (ref 65–99)

## 2014-09-30 MED ORDER — DOCUSATE SODIUM 100 MG PO CAPS: 100.0000 mg | ORAL_CAPSULE | Freq: Two times a day (BID) | ORAL | Status: DC

## 2014-09-30 MED ORDER — POLYETHYLENE GLYCOL 3350 17 G PO PACK
17.0000 g | PACK | Freq: Every day | ORAL | Status: DC
  Administered 2014-09-30: 17 g via ORAL
  Filled 2014-09-30: qty 1

## 2014-09-30 MED ORDER — POLYETHYLENE GLYCOL 3350 17 G PO PACK: 17.0000 g | PACK | Freq: Every day | ORAL | Status: AC | PRN

## 2014-09-30 MED ORDER — SODIUM CHLORIDE 0.9 % IJ SOLN: 10.0000 mL | INTRAMUSCULAR | Status: DC | PRN

## 2014-09-30 MED ORDER — SODIUM CHLORIDE 0.9 % IJ SOLN
10.0000 mL | Freq: Two times a day (BID) | INTRAMUSCULAR | Status: DC
Start: 2014-09-30 — End: 2014-09-30
  Administered 2014-09-30: 10 mL

## 2014-09-30 NOTE — Progress Notes (Signed)
Discontinue order for home Invanz 1 gram IM. Resume previous IV Invanz orders and also PT and RN services provided by [place] [facility]. V.O. Dr Graciella Freer, RN, BSN, CM on 09/30/14 @ 1pm.

## 2014-09-30 NOTE — Care Management Note (Signed)
Case Management Note  Patient Details  Name: [name] MRN: [id number] Date of Birth: 1931-06-11  Subjective/Objective:      Discussed discharge orders with Dr Elpidio Anis. [name] will resume previous home health orders and IV Invanz with [place] after discharge home today. A referral was faxed and called to [facility] at [place].               Action/Plan:   Expected Discharge Date:                  Expected Discharge Plan:     In-House Referral:     Discharge planning Services     Post Acute Care Choice:    Choice offered to:     DME Arranged:    DME Agency:     HH Arranged:    HH Agency:     Status of Service:     Medicare Important Message Given:    Date Medicare IM Given:    Medicare IM give by:    Date Additional Medicare IM Given:    Additional Medicare Important Message give by:     If discussed at Long Length of Stay Meetings, dates discussed:    Additional Comments:  Keyosha Tiedt A, RN 09/30/2014, 1:10 PM

## 2014-09-30 NOTE — Progress Notes (Signed)
Pt to be discharged to home today. She remains a/o. sats 97% on roomair. Appetite only fair. However she does not have her dentures here. bm yesterday. abd soft and non tender. Incontinent of urine. Afebrile. Iv and tele removed. disch instructions given to granddaughter. Pt discharged via w.c to home accompanied by family.

## 2014-09-30 NOTE — Discharge Instructions (Signed)
°  DIET:  Diabetic diet  DISCHARGE CONDITION:  Stable  ACTIVITY:  Activity as tolerated  OXYGEN:  Home Oxygen: No.   Oxygen Delivery: room air  DISCHARGE LOCATION:  home   If you experience worsening of your admission symptoms, develop shortness of breath, life threatening emergency, suicidal or homicidal thoughts you must seek medical attention immediately by calling 911 or calling your MD immediately  if symptoms less severe.  You Must read complete instructions/literature along with all the possible adverse reactions/side effects for all the Medicines you take and that have been prescribed to you. Take any new Medicines after you have completely understood and accpet all the possible adverse reactions/side effects.   Please note  You were cared for by a hospitalist during your hospital stay. If you have any questions about your discharge medications or the care you received while you were in the hospital after you are discharged, you can call the unit and asked to speak with the hospitalist on call if the hospitalist that took care of you is not available. Once you are discharged, your primary care physician will handle any further medical issues. Please note that NO REFILLS for any discharge medications will be authorized once you are discharged, as it is imperative that you return to your primary care physician (or establish a relationship with a primary care physician if you do not have one) for your aftercare needs so that they can reassess your need for medications and monitor your lab values.   Daily fluid < 2 liters a day.  Double your torsemide dose for 2 days after discharge and then return to previous regimen.

## 2014-10-01 LAB — MIN INHIBITORY CONC (1 DRUG)

## 2014-10-01 LAB — SPECIMEN STATUS REPORT

## 2014-10-02 NOTE — Discharge Summary (Signed)
[facility] [place] at [facility]   PATIENT NAME: [name]    MR#:  [id number]  DATE OF BIRTH:  03-12-1931  DATE OF ADMISSION:  09/29/2014 ADMITTING PHYSICIAN: Milagros Loll, MD  DATE OF DISCHARGE: 09/30/2014  4:55 PM  PRIMARY CARE PHYSICIAN: Fidel Levy, MD    ADMISSION DIAGNOSIS:  Belching [R14.2] Ileus [K56.7] Chest pain with moderate risk for cardiac etiology [R07.9]  DISCHARGE DIAGNOSIS:  Active Problems:   Ileus   Pressure ulcer   SECONDARY DIAGNOSIS:   Past Medical History  Diagnosis Date  . DM2 (diabetes mellitus, type 2)     onset age 5 insulin dependent  . Hyperlipidemia   . COPD (chronic obstructive pulmonary disease)   . Peripheral vascular disease   . CAD (coronary artery disease)     a. MI 1988 s/p CABG in 1988, multiple PCI on SVGs; b. cath 2012: occluded native coronary arteries, patent LIMA to LAD (distal LAD dz), patent SVG-OM & occluded SVG-RCA which filled via left to right collats; c. cath 12/2013 patent LIMA-LAD & SVG-O. known occluded VG-RCA w/ L-R collats, no change since cath 2012  . Chronic systolic CHF (congestive heart failure)     a. 03/2012: EF 40-45%, mild lateral wall HK, mod inf HK, mod post wall HK, mild LVH, mild MR/TR   . Paroxysmal atrial fibrillation     a. CHADSVASc 7: yearly risk of CVA 9.6%;. b. on Eliquis 2.5 mg bid (age, SCr, borderline wt 62.9 Kg)   . HTN (hypertension)   . Yeast infection   . UTI (urinary tract infection)   . Atherosclerosis of native artery of left lower extremity   . Myocardial infarction      ADMITTING HISTORY  [name] is a 81 y.o. female with a known history of CAD, hypertension, diabetes, anemia presents to the emergency room complaining of belching , nausea, diffuse abdominal pain. No chest pain or shortness of breath. Patient has been constipated for 3 days now. Abdominal x-ray showed ileus. No obstruction. Troponin was found to be elevated at 0.09.  EKG showed no acute changes. Patient was last seen in the hospital a month prior for urinary tract infection and had troponin of 1.74. Thought to be demand ischemia in setting of CKD by cardiology.   HOSPITAL COURSE:   * Ileus Likely from constipation. Dulcolax suppository. Lactulose. Nothing by mouth. No signs of infection. Patient's repeat abdominal x-ray after suppositories and having bowel movement showed no ileus. She tolerated her diet well. Is being discharged home on scheduled laxatives  * Elevated troponin minimal and 0.09 She does seem to have chronic elevation of troponin. Chest pain. Repeat troponin showed no significant elevation.  * Hypertension and diabetes Continue home medications along with sliding scale insulin.  * ESBL Escherichia coli UTI Patient is on Invanz through a PICC line as outpatient and will be continued while in the hospital. Continue same Invanz schedule as before after discharge.  * Anemia of chronic disease stable  * CKD 3 stable  * DVT prophylaxis with Lovenox  Stable for discharge. Patient ambulated well in the hallway. Tolerated food and had a normal bowel movement. Her nausea vomiting and abdominal pain have resolved.  CONSULTS OBTAINED:     DRUG ALLERGIES:   Allergies  Allergen Reactions  . Contrast Media [Iodinated Diagnostic Agents] Shortness Of Breath  . Tramadol Nausea Only  . Valium [Diazepam] Other (See Comments)    Reaction:  Elevated heart rate  . Iodine  Strong [Iodine] Rash  . Penicillins Rash    DISCHARGE MEDICATIONS:   Discharge Medication List as of 09/30/2014  2:34 PM    START taking these medications   Details  docusate sodium (COLACE) 100 MG capsule Take 1 capsule (100 mg total) by mouth 2 (two) times daily., Starting 09/30/2014, Until Discontinued, Print    polyethylene glycol (MIRALAX / GLYCOLAX) packet Take 17 g by mouth daily as needed for mild constipation or moderate constipation., Starting 09/30/2014,  Until Discontinued, Print      CONTINUE these medications which have NOT CHANGED   Details  albuterol (PROVENTIL) (2.5 MG/3ML) 0.083% nebulizer solution Take 3 mLs (2.5 mg total) by nebulization every 6 (six) hours as needed for wheezing or shortness of breath., Starting 09/12/2014, Until Discontinued, Normal    amiodarone (PACERONE) 200 MG tablet Take 1 tablet (200 mg total) by mouth daily., Starting 02/15/2014, Until Discontinued, Normal    apixaban (ELIQUIS) 2.5 MG TABS tablet Take 1 tablet (2.5 mg total) by mouth 2 (two) times daily., Starting 05/08/2014, Until Discontinued, Normal    benazepril (LOTENSIN) 20 MG tablet Take 20 mg by mouth daily., Until Discontinued, Historical Med    carvedilol (COREG) 3.125 MG tablet Take 1 tablet (3.125 mg total) by mouth 2 (two) times daily., Starting 02/15/2014, Until Discontinued, Normal    gabapentin (NEURONTIN) 400 MG capsule Take 400 mg by mouth 3 (three) times daily., Until Discontinued, Historical Med    HYDROcodone-acetaminophen (NORCO/VICODIN) 5-325 MG per tablet Take 1-2 tablets by mouth 2 (two) times daily as needed for moderate pain., Starting 09/12/2014, Until Discontinued, Print    insulin lispro (HUMALOG) 100 UNIT/ML injection Inject 0.04 mLs (4 Units total) into the skin 3 (three) times daily before meals., Starting 06/18/2014, Until Discontinued, Normal    INVANZ 1 G injection Inject 1 g into the muscle daily. , Starting 08/14/2014, Until Discontinued, Historical Med    isosorbide mononitrate (IMDUR) 60 MG 24 hr tablet Take 90 mg by mouth daily. , Until Discontinued, Historical Med    LANTUS SOLOSTAR 100 UNIT/ML Solostar Pen Inject 30 Units into the skin at bedtime. , Starting 09/15/2014, Until Discontinued, Historical Med    levothyroxine (SYNTHROID, LEVOTHROID) 100 MCG tablet Take 100 mcg by mouth daily. , Until Discontinued, Historical Med    nitroGLYCERIN (NITROSTAT) 0.4 MG SL tablet Place 1 tablet (0.4 mg total) under the tongue  every 5 (five) minutes as needed for chest pain., Starting 06/18/2014, Until Discontinued, Print    nystatin cream (MYCOSTATIN) Apply 1 application topically 2 (two) times daily., Starting 08/30/2014, Until Discontinued, Normal    pantoprazole (PROTONIX) 40 MG tablet Take 40 mg by mouth daily., Until Discontinued, Historical Med    potassium chloride (K-DUR) 10 MEQ tablet Take 10 mEq by mouth daily. , Until Discontinued, Historical Med    pravastatin (PRAVACHOL) 40 MG tablet Take 40 mg by mouth daily., Until Discontinued, Historical Med    torsemide (DEMADEX) 20 MG tablet Take 40 mg in the morning and 20 mg in the afternoon., No Print         Today    VITAL SIGNS:  Blood pressure 114/56, pulse 66, temperature 98.3 F (36.8 C), temperature source Oral, resp. rate 18, height 5' 1.5" (1.562 m), weight 67.949 kg (149 lb 12.8 oz), SpO2 95 %.  I/O:  No intake or output data in the 24 hours ending 10/02/14 1432  PHYSICAL EXAMINATION:  Physical Exam  GENERAL:  81 y.o.-year-old patient lying in the bed with no acute  distress.  LUNGS: Normal breath sounds bilaterally, no wheezing, rales,rhonchi or crepitation. No use of accessory muscles of respiration.  CARDIOVASCULAR: S1, S2 normal. No murmurs, rubs, or gallops.  ABDOMEN: Soft, non-tender, non-distended. Bowel sounds present. No organomegaly or mass.  NEUROLOGIC: Moves all 4 extremities. PSYCHIATRIC: The patient is alert and oriented x 3.  SKIN: No obvious rash, lesion, or ulcer.   DATA REVIEW:   CBC  Recent Labs Lab 09/29/14 0834  WBC 5.1  HGB 8.7*  HCT 28.5*  PLT 196    Chemistries   Recent Labs Lab 09/29/14 0834  NA 139  K 4.0  CL 102  CO2 26  GLUCOSE 246*  BUN 29*  CREATININE 1.81*  CALCIUM 8.9  AST 19  ALT 7*  ALKPHOS 71  BILITOT 0.4    Cardiac Enzymes  Recent Labs Lab 09/29/14 2048  TROPONINI 0.18*    Microbiology Results  Results for orders placed or performed in visit on 09/22/14   Microscopic Examination     Status: Abnormal   Collection Time: 09/22/14  2:14 PM  Result Value Ref Range Status   WBC, UA >30 (A) 0 -  5 /hpf Final   RBC, UA 0-2 0 -  2 /hpf Final   Epithelial Cells (non renal) 0-10 0 - 10 /hpf Final   Renal Epithel, UA None seen None seen /hpf Final   Casts Present (A) None seen /lpf Final   Cast Type Hyaline casts N/A Final   Bacteria, UA Many (A) None seen/Few Final  CULTURE, URINE COMPREHENSIVE     Status: Abnormal   Collection Time: 09/22/14  3:18 PM  Result Value Ref Range Status   Urine Culture, Comprehensive Final report (A)  Final   Result 1 Escherichia coli (A)  Final    Comment: Greater than 100,000 colony forming units per mL   ANTIMICROBIAL SUSCEPTIBILITY Comment  Final    Comment:       ** S = Susceptible; I = Intermediate; R = Resistant **                    P = Positive; N = Negative             MICS are expressed in micrograms per mL    Antibiotic                 RSLT#1    RSLT#2    RSLT#3    RSLT#4 Amoxicillin/Clavulanic Acid    R Ampicillin                     R Cefazolin                      R Cefepime                       R Ceftriaxone                    R Cefuroxime                     R Cephalothin                    R Ciprofloxacin                  R Ertapenem  S Gentamicin                     S Imipenem                       S Levofloxacin                   R Nitrofurantoin                 R Piperacillin                   R Tetracycline                   R Tobramycin                     R Trimethoprim/Sulfa             R   Min Inhibitory Conc (1 Drug)     Status: None   Collection Time: 09/22/14  3:18 PM  Result Value Ref Range Status   Min Inhibitory Conc (1 Drug) Final report  Final   Result 1 Escherichia coli  Final    Comment: FOSFOMYCIN    =     .75 UG/ML    = SUSCEPTIBLE    RADIOLOGY:  No results found.    Follow up with PCP in 1 week.  Management plans discussed with the  patient, family and they are in agreement.  CODE STATUS:  Advance Directive Documentation        Most Recent Value   Type of Advance Directive  Healthcare Power of Attorney   Pre-existing out of facility DNR order (yellow form or pink MOST form)     "MOST" Form in Place?        TOTAL TIME TAKING CARE OF THIS PATIENT ON DAY OF DISCHARGE: more than 30 minutes.    Milagros Loll R M.D on 10/02/2014 at 2:32 PM  Between 7am to 6pm - Pager - [phone]  After 6pm go to [email or web address] - password [email or web address] [facility]  [facility]  Office  [phone]  CC: Primary care physician; Fidel Levy, MD

## 2014-10-03 ENCOUNTER — Other Ambulatory Visit: Payer: Self-pay | Admitting: Family Medicine

## 2014-10-03 MED ORDER — PRAVASTATIN SODIUM 40 MG PO TABS: 40.0000 mg | ORAL_TABLET | Freq: Every day | ORAL | Status: AC

## 2014-10-10 ENCOUNTER — Other Ambulatory Visit: Payer: Self-pay | Admitting: Urology

## 2014-10-10 ENCOUNTER — Telehealth: Payer: Self-pay

## 2014-10-10 NOTE — Telephone Encounter (Signed)
Fosamycin is susceptible to pt current infection.

## 2014-10-13 ENCOUNTER — Telehealth: Payer: Self-pay | Admitting: Family Medicine

## 2014-10-13 NOTE — Telephone Encounter (Signed)
Called the office of [name] regarding urine culture results. Results for this patient were faxed to the office. He was also managing her IV- home health RN wanted to know if they should pull the PICC line. Have asked the office to let us know if they need a pull PICC order from [facility]. They will speak with Dr. Sampson Goon and let us know if they need [facility] to send an order.

## 2014-10-16 ENCOUNTER — Ambulatory Visit: Payer: Medicare Other | Admitting: Family Medicine

## 2014-10-17 ENCOUNTER — Inpatient Hospital Stay: Payer: Medicare Other

## 2014-10-17 ENCOUNTER — Emergency Department: Payer: Medicare Other

## 2014-10-17 ENCOUNTER — Encounter: Payer: Self-pay | Admitting: Emergency Medicine

## 2014-10-17 ENCOUNTER — Inpatient Hospital Stay
Admission: EM | Admit: 2014-10-17 | Discharge: 2014-10-24 | DRG: 871 | Disposition: A | Discharge status: Home Health | Payer: Medicare Other | Attending: Internal Medicine | Admitting: Internal Medicine

## 2014-10-17 DIAGNOSIS — R112 Nausea with vomiting, unspecified: Secondary | ICD-10-CM

## 2014-10-17 DIAGNOSIS — Z951 Presence of aortocoronary bypass graft: Secondary | ICD-10-CM | POA: Diagnosis not present

## 2014-10-17 DIAGNOSIS — Z880 Allergy status to penicillin: Secondary | ICD-10-CM

## 2014-10-17 DIAGNOSIS — I252 Old myocardial infarction: Secondary | ICD-10-CM

## 2014-10-17 DIAGNOSIS — Z888 Allergy status to other drugs, medicaments and biological substances status: Secondary | ICD-10-CM | POA: Diagnosis not present

## 2014-10-17 DIAGNOSIS — I2510 Atherosclerotic heart disease of native coronary artery without angina pectoris: Secondary | ICD-10-CM | POA: Diagnosis present

## 2014-10-17 DIAGNOSIS — A419 Sepsis, unspecified organism: Principal | ICD-10-CM | POA: Diagnosis present

## 2014-10-17 DIAGNOSIS — R51 Headache: Secondary | ICD-10-CM

## 2014-10-17 DIAGNOSIS — J9601 Acute respiratory failure with hypoxia: Secondary | ICD-10-CM

## 2014-10-17 DIAGNOSIS — Z87891 Personal history of nicotine dependence: Secondary | ICD-10-CM | POA: Diagnosis not present

## 2014-10-17 DIAGNOSIS — R188 Other ascites: Secondary | ICD-10-CM | POA: Diagnosis present

## 2014-10-17 DIAGNOSIS — R519 Headache, unspecified: Secondary | ICD-10-CM

## 2014-10-17 DIAGNOSIS — Y95 Nosocomial condition: Secondary | ICD-10-CM | POA: Diagnosis present

## 2014-10-17 DIAGNOSIS — E785 Hyperlipidemia, unspecified: Secondary | ICD-10-CM | POA: Diagnosis present

## 2014-10-17 DIAGNOSIS — I248 Other forms of acute ischemic heart disease: Secondary | ICD-10-CM | POA: Diagnosis present

## 2014-10-17 DIAGNOSIS — R110 Nausea: Secondary | ICD-10-CM

## 2014-10-17 DIAGNOSIS — I129 Hypertensive chronic kidney disease with stage 1 through stage 4 chronic kidney disease, or unspecified chronic kidney disease: Secondary | ICD-10-CM | POA: Diagnosis present

## 2014-10-17 DIAGNOSIS — Z96641 Presence of right artificial hip joint: Secondary | ICD-10-CM | POA: Diagnosis present

## 2014-10-17 DIAGNOSIS — I214 Non-ST elevation (NSTEMI) myocardial infarction: Secondary | ICD-10-CM

## 2014-10-17 DIAGNOSIS — Z794 Long term (current) use of insulin: Secondary | ICD-10-CM

## 2014-10-17 DIAGNOSIS — I5023 Acute on chronic systolic (congestive) heart failure: Secondary | ICD-10-CM | POA: Diagnosis present

## 2014-10-17 DIAGNOSIS — J189 Pneumonia, unspecified organism: Secondary | ICD-10-CM | POA: Diagnosis present

## 2014-10-17 DIAGNOSIS — R778 Other specified abnormalities of plasma proteins: Secondary | ICD-10-CM

## 2014-10-17 DIAGNOSIS — R7989 Other specified abnormal findings of blood chemistry: Secondary | ICD-10-CM | POA: Diagnosis not present

## 2014-10-17 DIAGNOSIS — N183 Chronic kidney disease, stage 3 (moderate): Secondary | ICD-10-CM | POA: Diagnosis present

## 2014-10-17 DIAGNOSIS — Z9981 Dependence on supplemental oxygen: Secondary | ICD-10-CM | POA: Diagnosis not present

## 2014-10-17 DIAGNOSIS — N170 Acute kidney failure with tubular necrosis: Secondary | ICD-10-CM

## 2014-10-17 DIAGNOSIS — D649 Anemia, unspecified: Secondary | ICD-10-CM | POA: Diagnosis present

## 2014-10-17 DIAGNOSIS — Z79899 Other long term (current) drug therapy: Secondary | ICD-10-CM | POA: Diagnosis not present

## 2014-10-17 DIAGNOSIS — Z91041 Radiographic dye allergy status: Secondary | ICD-10-CM

## 2014-10-17 DIAGNOSIS — Z993 Dependence on wheelchair: Secondary | ICD-10-CM | POA: Diagnosis not present

## 2014-10-17 DIAGNOSIS — E119 Type 2 diabetes mellitus without complications: Secondary | ICD-10-CM | POA: Diagnosis present

## 2014-10-17 DIAGNOSIS — M25559 Pain in unspecified hip: Secondary | ICD-10-CM

## 2014-10-17 DIAGNOSIS — R748 Abnormal levels of other serum enzymes: Secondary | ICD-10-CM | POA: Diagnosis present

## 2014-10-17 DIAGNOSIS — J449 Chronic obstructive pulmonary disease, unspecified: Secondary | ICD-10-CM | POA: Diagnosis present

## 2014-10-17 DIAGNOSIS — R410 Disorientation, unspecified: Secondary | ICD-10-CM | POA: Diagnosis present

## 2014-10-17 DIAGNOSIS — I480 Paroxysmal atrial fibrillation: Secondary | ICD-10-CM | POA: Diagnosis present

## 2014-10-17 DIAGNOSIS — N390 Urinary tract infection, site not specified: Secondary | ICD-10-CM | POA: Diagnosis not present

## 2014-10-17 DIAGNOSIS — B9620 Unspecified Escherichia coli [E. coli] as the cause of diseases classified elsewhere: Secondary | ICD-10-CM | POA: Diagnosis not present

## 2014-10-17 HISTORY — DX: Chronic kidney disease, stage 3 (moderate): N18.3

## 2014-10-17 HISTORY — DX: Chronic kidney disease, stage 3 unspecified: N18.30

## 2014-10-17 HISTORY — DX: Hypoxemia: R09.02

## 2014-10-17 LAB — GLUCOSE, CAPILLARY
GLUCOSE-CAPILLARY: 172 mg/dL — AB (ref 65–99)
GLUCOSE-CAPILLARY: 162 mg/dL — AB (ref 65–99)
Glucose-Capillary: 135 mg/dL — ABNORMAL HIGH (ref 65–99)

## 2014-10-17 LAB — BLOOD GAS, ARTERIAL
ACID-BASE EXCESS: 1.5 mmol/L (ref 0.0–3.0)
Allens test (pass/fail): POSITIVE — AB
BICARBONATE: 25.9 meq/L (ref 21.0–28.0)
FIO2: 1
O2 Saturation: 100 %
PCO2 ART: 39 mmHg (ref 32.0–48.0)
PH ART: 7.43 (ref 7.350–7.450)
Patient temperature: 37
pO2, Arterial: 398 mmHg — ABNORMAL HIGH (ref 83.0–108.0)

## 2014-10-17 LAB — CBC WITH DIFFERENTIAL/PLATELET
Basophils Absolute: 0.1 10*3/uL (ref 0–0.1)
EOS ABS: 0.1 10*3/uL (ref 0–0.7)
HCT: 32.9 % — ABNORMAL LOW (ref 35.0–47.0)
HEMOGLOBIN: 9.8 g/dL — AB (ref 12.0–16.0)
Lymphocytes Relative: 27 %
Lymphs Abs: 1.9 10*3/uL (ref 1.0–3.6)
MCH: 23.5 pg — AB (ref 26.0–34.0)
MCHC: 29.8 g/dL — AB (ref 32.0–36.0)
MCV: 78.8 fL — ABNORMAL LOW (ref 80.0–100.0)
Monocytes Absolute: 0.8 10*3/uL (ref 0.2–0.9)
Neutro Abs: 4.4 10*3/uL (ref 1.4–6.5)
PLATELETS: 300 10*3/uL (ref 150–440)
RBC: 4.17 MIL/uL (ref 3.80–5.20)
RDW: 20.2 % — ABNORMAL HIGH (ref 11.5–14.5)
WBC: 7.2 10*3/uL (ref 3.6–11.0)

## 2014-10-17 LAB — URINALYSIS COMPLETE WITH MICROSCOPIC (ARMC ONLY)
BILIRUBIN URINE: NEGATIVE
GLUCOSE, UA: NEGATIVE mg/dL
KETONES UR: NEGATIVE mg/dL
NITRITE: NEGATIVE
PH: 5 (ref 5.0–8.0)
Protein, ur: 100 mg/dL — AB
Specific Gravity, Urine: 1.016 (ref 1.005–1.030)

## 2014-10-17 LAB — BASIC METABOLIC PANEL
Anion gap: 10 (ref 5–15)
BUN: 34 mg/dL — ABNORMAL HIGH (ref 6–20)
CALCIUM: 8 mg/dL — AB (ref 8.9–10.3)
CO2: 28 mmol/L (ref 22–32)
CREATININE: 2.07 mg/dL — AB (ref 0.44–1.00)
Chloride: 102 mmol/L (ref 101–111)
GFR calc non Af Amer: 21 mL/min — ABNORMAL LOW (ref >60)
GFR, EST AFRICAN AMERICAN: 25 mL/min — AB (ref >60)
Glucose, Bld: 154 mg/dL — ABNORMAL HIGH (ref 65–99)
Potassium: 4.8 mmol/L (ref 3.5–5.1)
Sodium: 140 mmol/L (ref 135–145)

## 2014-10-17 LAB — MRSA PCR SCREENING: MRSA by PCR: NEGATIVE

## 2014-10-17 LAB — CBC
HEMATOCRIT: 26.1 % — AB (ref 35.0–47.0)
Hemoglobin: 7.7 g/dL — ABNORMAL LOW (ref 12.0–16.0)
MCH: 23.1 pg — ABNORMAL LOW (ref 26.0–34.0)
MCHC: 29.6 g/dL — AB (ref 32.0–36.0)
MCV: 78 fL — ABNORMAL LOW (ref 80.0–100.0)
PLATELETS: 212 10*3/uL (ref 150–440)
RBC: 3.35 MIL/uL — ABNORMAL LOW (ref 3.80–5.20)
RDW: 20.2 % — AB (ref 11.5–14.5)
WBC: 4.2 10*3/uL (ref 3.6–11.0)

## 2014-10-17 LAB — PROTIME-INR
INR: 2.1
PROTHROMBIN TIME: 23.7 s — AB (ref 11.4–15.0)

## 2014-10-17 LAB — CKMB (ARMC ONLY)
CK, MB: 2.1 ng/mL (ref 0.5–5.0)
CK, MB: 2.1 ng/mL (ref 0.5–5.0)
CK, MB: 2.6 ng/mL (ref 0.5–5.0)

## 2014-10-17 LAB — COMPREHENSIVE METABOLIC PANEL
ALK PHOS: 101 U/L (ref 38–126)
ALT: 26 U/L (ref 14–54)
AST: 91 U/L — AB (ref 15–41)
Albumin: 3.8 g/dL (ref 3.5–5.0)
Anion gap: 14 (ref 5–15)
BUN: 34 mg/dL — AB (ref 6–20)
CALCIUM: 9 mg/dL (ref 8.9–10.3)
CO2: 27 mmol/L (ref 22–32)
CREATININE: 1.95 mg/dL — AB (ref 0.44–1.00)
Chloride: 100 mmol/L — ABNORMAL LOW (ref 101–111)
GFR, EST AFRICAN AMERICAN: 27 mL/min — AB (ref >60)
GFR, EST NON AFRICAN AMERICAN: 23 mL/min — AB (ref >60)
Glucose, Bld: 181 mg/dL — ABNORMAL HIGH (ref 65–99)
Potassium: 4.5 mmol/L (ref 3.5–5.1)
Sodium: 141 mmol/L (ref 135–145)
Total Bilirubin: 1.2 mg/dL (ref 0.3–1.2)
Total Protein: 7.1 g/dL (ref 6.5–8.1)

## 2014-10-17 LAB — FIBRIN DERIVATIVES D-DIMER (ARMC ONLY): FIBRIN DERIVATIVES D-DIMER (ARMC): 1990 — AB (ref 0–499)

## 2014-10-17 LAB — LACTIC ACID, PLASMA
LACTIC ACID, VENOUS: 3 mmol/L — AB (ref 0.5–2.0)
LACTIC ACID, VENOUS: 2.2 mmol/L — AB (ref 0.5–2.0)

## 2014-10-17 LAB — TROPONIN I
TROPONIN I: 0.18 ng/mL — AB (ref <0.031)
Troponin I: 0.14 ng/mL — ABNORMAL HIGH (ref <0.031)
Troponin I: 0.18 ng/mL — ABNORMAL HIGH (ref <0.031)
Troponin I: 0.27 ng/mL — ABNORMAL HIGH (ref <0.031)

## 2014-10-17 LAB — PREPARE RBC (CROSSMATCH)

## 2014-10-17 MED ORDER — SENNA 8.6 MG PO TABS: 1.0000 | ORAL_TABLET | Freq: Every day | ORAL | Status: DC | PRN

## 2014-10-17 MED ORDER — AZITHROMYCIN 250 MG PO TABS
500.0000 mg | ORAL_TABLET | Freq: Every day | ORAL | Status: DC
  Administered 2014-10-17 – 2014-10-19 (×3): 500 mg via ORAL
  Filled 2014-10-17 (×3): qty 2

## 2014-10-17 MED ORDER — AMIODARONE HCL 200 MG PO TABS
200.0000 mg | ORAL_TABLET | Freq: Every day | ORAL | Status: DC
Start: 2014-10-17 — End: 2014-10-18
  Administered 2014-10-17: 200 mg via ORAL
  Filled 2014-10-17: qty 1

## 2014-10-17 MED ORDER — PANTOPRAZOLE SODIUM 40 MG PO TBEC
40.0000 mg | DELAYED_RELEASE_TABLET | Freq: Every day | ORAL | Status: DC
  Administered 2014-10-17 – 2014-10-18 (×2): 40 mg via ORAL
  Filled 2014-10-17 (×2): qty 1

## 2014-10-17 MED ORDER — DEXTROSE 5 % IV SOLN
1.0000 g | INTRAVENOUS | Status: DC
  Administered 2014-10-17: 1 g via INTRAVENOUS
  Filled 2014-10-17: qty 10

## 2014-10-17 MED ORDER — BENAZEPRIL HCL 20 MG PO TABS
20.0000 mg | ORAL_TABLET | Freq: Every day | ORAL | Status: DC
  Administered 2014-10-17: 20 mg via ORAL
  Filled 2014-10-17: qty 1

## 2014-10-17 MED ORDER — INSULIN ASPART 100 UNIT/ML ~~LOC~~ SOLN: 0.0000 [IU] | Freq: Every day | SUBCUTANEOUS | Status: DC

## 2014-10-17 MED ORDER — ONDANSETRON HCL 4 MG/2ML IJ SOLN
INTRAMUSCULAR | Status: AC
  Administered 2014-10-17: 4 mg via INTRAVENOUS
  Filled 2014-10-17: qty 2

## 2014-10-17 MED ORDER — NITROGLYCERIN 0.4 MG SL SUBL: 0.4000 mg | SUBLINGUAL_TABLET | SUBLINGUAL | Status: DC | PRN

## 2014-10-17 MED ORDER — DOCUSATE SODIUM 100 MG PO CAPS: 100.0000 mg | ORAL_CAPSULE | Freq: Two times a day (BID) | ORAL | Status: DC | PRN

## 2014-10-17 MED ORDER — SODIUM CHLORIDE 0.9 % IJ SOLN
3.0000 mL | Freq: Two times a day (BID) | INTRAMUSCULAR | Status: DC
  Administered 2014-10-17 – 2014-10-24 (×15): 3 mL via INTRAVENOUS

## 2014-10-17 MED ORDER — APIXABAN 5 MG PO TABS
2.5000 mg | ORAL_TABLET | Freq: Two times a day (BID) | ORAL | Status: DC
  Administered 2014-10-17: 2.5 mg via ORAL
  Filled 2014-10-17: qty 1

## 2014-10-17 MED ORDER — SODIUM CHLORIDE 0.9 % IV SOLN
Freq: Once | INTRAVENOUS | Status: AC
  Administered 2014-10-17: 23:00:00 via INTRAVENOUS

## 2014-10-17 MED ORDER — CARVEDILOL 3.125 MG PO TABS
3.1250 mg | ORAL_TABLET | Freq: Two times a day (BID) | ORAL | Status: DC
  Administered 2014-10-17: 3.125 mg via ORAL
  Filled 2014-10-17 (×2): qty 1

## 2014-10-17 MED ORDER — INSULIN ASPART 100 UNIT/ML ~~LOC~~ SOLN
0.0000 [IU] | Freq: Three times a day (TID) | SUBCUTANEOUS | Status: DC
  Administered 2014-10-17: 1 [IU] via SUBCUTANEOUS
  Administered 2014-10-19: 3 [IU] via SUBCUTANEOUS
  Administered 2014-10-19: 2 [IU] via SUBCUTANEOUS
  Administered 2014-10-20: 3 [IU] via SUBCUTANEOUS
  Administered 2014-10-20: 2 [IU] via SUBCUTANEOUS
  Administered 2014-10-20: 3 [IU] via SUBCUTANEOUS
  Administered 2014-10-21 (×2): 2 [IU] via SUBCUTANEOUS
  Administered 2014-10-22: 1 [IU] via SUBCUTANEOUS
  Administered 2014-10-23 (×2): 2 [IU] via SUBCUTANEOUS
  Administered 2014-10-23 – 2014-10-24 (×2): 1 [IU] via SUBCUTANEOUS
  Administered 2014-10-24: 2 [IU] via SUBCUTANEOUS
  Administered 2014-10-24: 3 [IU] via SUBCUTANEOUS
  Filled 2014-10-17: qty 3
  Filled 2014-10-17: qty 2
  Filled 2014-10-17 (×3): qty 1
  Filled 2014-10-17 (×5): qty 2
  Filled 2014-10-17 (×2): qty 3
  Filled 2014-10-17 (×2): qty 2
  Filled 2014-10-17: qty 1

## 2014-10-17 MED ORDER — ONDANSETRON HCL 4 MG/2ML IJ SOLN: 4.0000 mg | Freq: Four times a day (QID) | INTRAMUSCULAR | Status: DC | PRN

## 2014-10-17 MED ORDER — ACETAMINOPHEN 650 MG RE SUPP: 650.0000 mg | Freq: Four times a day (QID) | RECTAL | Status: DC | PRN

## 2014-10-17 MED ORDER — INSULIN ASPART 100 UNIT/ML ~~LOC~~ SOLN: 4.0000 [IU] | Freq: Three times a day (TID) | SUBCUTANEOUS | Status: DC

## 2014-10-17 MED ORDER — NYSTATIN 100000 UNIT/GM EX CREA
1.0000 "application " | TOPICAL_CREAM | Freq: Two times a day (BID) | CUTANEOUS | Status: DC
  Administered 2014-10-17 – 2014-10-24 (×15): 1 via TOPICAL
  Filled 2014-10-17 (×3): qty 15

## 2014-10-17 MED ORDER — LEVOTHYROXINE SODIUM 100 MCG PO TABS
100.0000 ug | ORAL_TABLET | Freq: Every day | ORAL | Status: DC
  Administered 2014-10-18 – 2014-10-24 (×7): 100 ug via ORAL
  Filled 2014-10-17 (×7): qty 1

## 2014-10-17 MED ORDER — IPRATROPIUM-ALBUTEROL 0.5-2.5 (3) MG/3ML IN SOLN
3.0000 mL | Freq: Once | RESPIRATORY_TRACT | Status: AC
  Administered 2014-10-17: 3 mL via RESPIRATORY_TRACT

## 2014-10-17 MED ORDER — SODIUM CHLORIDE 0.9 % IV SOLN
500.0000 mg | Freq: Two times a day (BID) | INTRAVENOUS | Status: DC
  Administered 2014-10-17 – 2014-10-23 (×13): 500 mg via INTRAVENOUS
  Filled 2014-10-17 (×17): qty 0.5

## 2014-10-17 MED ORDER — ALBUTEROL SULFATE (2.5 MG/3ML) 0.083% IN NEBU: 2.5000 mg | INHALATION_SOLUTION | Freq: Four times a day (QID) | RESPIRATORY_TRACT | Status: DC | PRN

## 2014-10-17 MED ORDER — HYDROCODONE-ACETAMINOPHEN 5-325 MG PO TABS
1.0000 | ORAL_TABLET | Freq: Two times a day (BID) | ORAL | Status: DC | PRN
  Administered 2014-10-17 – 2014-10-24 (×4): 1 via ORAL
  Filled 2014-10-17 (×4): qty 1
  Filled 2014-10-17: qty 2

## 2014-10-17 MED ORDER — GABAPENTIN 400 MG PO CAPS
400.0000 mg | ORAL_CAPSULE | Freq: Three times a day (TID) | ORAL | Status: DC
  Administered 2014-10-17 – 2014-10-24 (×22): 400 mg via ORAL
  Filled 2014-10-17 (×22): qty 1

## 2014-10-17 MED ORDER — POLYETHYLENE GLYCOL 3350 17 G PO PACK
17.0000 g | PACK | Freq: Every day | ORAL | Status: DC | PRN
  Administered 2014-10-24: 17 g via ORAL
  Filled 2014-10-17 (×2): qty 1

## 2014-10-17 MED ORDER — INSULIN GLARGINE 100 UNIT/ML ~~LOC~~ SOLN
32.0000 [IU] | Freq: Every day | SUBCUTANEOUS | Status: DC
  Filled 2014-10-17: qty 0.32

## 2014-10-17 MED ORDER — IPRATROPIUM-ALBUTEROL 0.5-2.5 (3) MG/3ML IN SOLN
RESPIRATORY_TRACT | Status: AC
  Administered 2014-10-17: 3 mL via RESPIRATORY_TRACT
  Filled 2014-10-17: qty 3

## 2014-10-17 MED ORDER — ONDANSETRON HCL 4 MG/2ML IJ SOLN
4.0000 mg | Freq: Once | INTRAMUSCULAR | Status: AC
  Administered 2014-10-17: 4 mg via INTRAVENOUS

## 2014-10-17 MED ORDER — LEVOFLOXACIN IN D5W 500 MG/100ML IV SOLN
500.0000 mg | Freq: Once | INTRAVENOUS | Status: DC
  Filled 2014-10-17: qty 100

## 2014-10-17 MED ORDER — PRAVASTATIN SODIUM 20 MG PO TABS
40.0000 mg | ORAL_TABLET | Freq: Every day | ORAL | Status: DC
  Administered 2014-10-17 – 2014-10-24 (×8): 40 mg via ORAL
  Filled 2014-10-17 (×4): qty 2
  Filled 2014-10-17: qty 1
  Filled 2014-10-17 (×3): qty 2

## 2014-10-17 MED ORDER — ACETAMINOPHEN 325 MG PO TABS
650.0000 mg | ORAL_TABLET | Freq: Four times a day (QID) | ORAL | Status: DC | PRN
  Administered 2014-10-18 – 2014-10-19 (×2): 650 mg via ORAL
  Filled 2014-10-17 (×2): qty 2

## 2014-10-17 MED ORDER — SODIUM CHLORIDE 0.9 % IV BOLUS (SEPSIS)
500.0000 mL | Freq: Once | INTRAVENOUS | Status: AC
  Administered 2014-10-17: 500 mL via INTRAVENOUS

## 2014-10-17 MED ORDER — IPRATROPIUM-ALBUTEROL 0.5-2.5 (3) MG/3ML IN SOLN: 3.0000 mL | Freq: Once | RESPIRATORY_TRACT | Status: DC

## 2014-10-17 MED ORDER — ISOSORBIDE MONONITRATE ER 60 MG PO TB24
90.0000 mg | ORAL_TABLET | Freq: Every day | ORAL | Status: DC
  Administered 2014-10-17: 90 mg via ORAL
  Filled 2014-10-17: qty 1

## 2014-10-17 NOTE — Plan of Care (Signed)
Problem: Problem: Skin/Wound Progression Goal: APPROPRIATE NUTRITIONAL STATUS Outcome: Not Met (add Reason) Patient is NPO

## 2014-10-17 NOTE — Progress Notes (Signed)
Notified by RN, the patient is hypotensive. Complaining of blurred vision and dizziness and lightheadedness. Systolic blood pressure was in the 70s. Went to reassess patient. She was started on a 1 L bolus. Blood pressure still remaining about the same with map in the 50s. Her symptoms are improved at this time. She is talking home. Hands are cold to touch, peripheral saturations show hypoxia. Started on 100% nonrebreather mask. Lungs sound clear to auscultation with decreased bibasilar breath sounds. No crackles heard. Patient appears pale than when she was seen in the urgency room. Her blood pressure and vitals were stable up until now.  Patient will be moved to ICU. Continue the fluid bolus. And then stopped due to her history of CHF. If needed will start on levophed. Labs ordered included stat hemoglobin, basic metabolic panel, coagulation studies and lactic acid. ABG and chest x-ray will be ordered as well. -Patient already has a PICC line. Due to her history of ESBL UTI several times, we'll change her Rocephin to meropenem at this time until cultures are available. Patient is on eliquis, there is no active bleeding noted at this time.  Her granddaughter will be updated.  Additional critical care time spent on this patient is 40 minutes.

## 2014-10-17 NOTE — Plan of Care (Signed)
Problem: Phase I Progression Outcomes Goal: Dyspnea controlled at rest Outcome: Progressing Patient progressed from 100% non-rebreather to 4 L nasal cannula

## 2014-10-17 NOTE — Progress Notes (Signed)
Called into patients room by a visitor stating patient was moaning.  Upon entering patients room, patient appeared very pale and stated she had very blurry vision.   Took complete set of vitals as well as FSBG.  Patient hypotensive, hypoxic, pinpoint pupils.  Put on 4LNC and oxygenation in high 80s/low 90s.  Dr. Nemiah Commander called and ordered 500cc bolus of fluids.  BP continues to drop as does oxygenation.  Rapid response is called.    Patient put on non-rebreather.  CBC, MetB, lactic acid labs, ABG ordered.    Jason Fila ordered that patient be put on Contact Precautions for history of ESBL in urine until this can be rulled out.    Patient moved to CCU 5, Brittney, Charity fundraiser.

## 2014-10-17 NOTE — Care Management (Addendum)
Patient last discharge fro [facility] 7/18 with home health nursing, aide,  physicial therapy, occupational therapy and IV antibiotics through [place].  She did not qualify for home 02 during that admission.  She also received a hospital bed and wheelchair pad.  She presents back with sx concerning for pneumonia.  A rapid response was called for hypotension and placed on high flow rebreather.  Patient's granddaughter is a Engineer, civil (consulting) and works at [employer].  Patient was transferred to ICU.  Notified agency of admission

## 2014-10-17 NOTE — H&P (Signed)
[facility] - [place] at [facility]   PATIENT NAME: [name]    MR#:  [id number]  DATE OF BIRTH:  03/01/1931  DATE OF ADMISSION:  10/17/2014  PRIMARY CARE PHYSICIAN: Fidel Levy, MD   REQUESTING/REFERRING PHYSICIAN: Dr. Bayard Males  CHIEF COMPLAINT:   Chief Complaint  Patient presents with  . Shortness of Breath    HISTORY OF PRESENT ILLNESS:  [name]  is a 81 y.o. female with a known history of hypertension, insulin-dependent diabetes mellitus, coronary artery disease status post bypass graft surgery, paroxysmal atrial fibrillation on eliquis, history of recurrent ESBL Escherichia coli UTI, was recently treated with Invanz up until 3 days ago using a PICC line at home, presents to the hospital secondary to worsening cough and difficulty breathing and also chest pain. Patient has been following with Dr. Sampson Goon for her recurrent ESBL UTIs.  Also had chills and chest pain on coughing for 2 days now. Associated with nausea, vomiting and some diarrhea episodes. CXR here showing LLL infiltrate. So being admitted for pneumonia.  PAST MEDICAL HISTORY:   Past Medical History  Diagnosis Date  . DM2 (diabetes mellitus, type 2)     onset age 40 insulin dependent  . Hyperlipidemia   . COPD (chronic obstructive pulmonary disease)   . Peripheral vascular disease   . CAD (coronary artery disease)     a. MI 1988 s/p CABG in 1988, multiple PCI on SVGs; b. cath 2012: occluded native coronary arteries, patent LIMA to LAD (distal LAD dz), patent SVG-OM & occluded SVG-RCA which filled via left to right collats; c. cath 12/2013 patent LIMA-LAD & SVG-O. known occluded VG-RCA w/ L-R collats, no change since cath 2012  . Chronic systolic CHF (congestive heart failure)     a. 03/2012: EF 40-45%, mild lateral wall HK, mod inf HK, mod post wall HK, mild LVH, mild MR/TR   . Paroxysmal atrial fibrillation     a. CHADSVASc 7: yearly risk of CVA 9.6%;. b.  on Eliquis 2.5 mg bid (age, SCr, borderline wt 62.9 Kg)   . HTN (hypertension)   . Yeast infection   . UTI (urinary tract infection)     Recurrent ESBL E.coli UTI  . Atherosclerosis of native artery of left lower extremity   . Myocardial infarction   . CKD (chronic kidney disease) stage 3, GFR 30-59 ml/min   . Hypoxia     on nocturnal o2    PAST SURGICAL HISTORY:   Past Surgical History  Procedure Laterality Date  . Abdominal hysterectomy    . Coronary artery bypass graft    . Appendectomy    . Cholecystectomy    . Ovary surgery    . Angioplasty / stenting femoral      Bilateral SFA stenting  . Femur surgery    . Cardiac catheterization      [facility]  . Cardiac catheterization  12/2013  . Right hip replacement    . Total knee arthroplasty      right knee    SOCIAL HISTORY:   Social History  Substance Use Topics  . Smoking status: Former Smoker -- 15 years    Types: Cigarettes    Quit date: 02/18/1988  . Smokeless tobacco: Never Used  . Alcohol Use: No    FAMILY HISTORY:   Family History  Problem Relation Age of Onset  . Diabetes Mother   . Heart disease Mother   . Hypertension Mother   . Heart disease Father   .  Heart disease Brother   . CVA Father     DRUG ALLERGIES:   Allergies  Allergen Reactions  . Contrast Media [Iodinated Diagnostic Agents] Shortness Of Breath  . Tramadol Nausea Only  . Valium [Diazepam] Other (See Comments)    Reaction:  Elevated heart rate  . Iodine Strong [Iodine] Rash  . Penicillins Rash    REVIEW OF SYSTEMS:   Review of Systems  Constitutional: Positive for chills. Negative for fever, weight loss and malaise/fatigue.  HENT: Negative for ear discharge, ear pain, hearing loss, nosebleeds and tinnitus.   Eyes: Negative for blurred vision, double vision and photophobia.  Respiratory: Positive for cough and shortness of breath. Negative for hemoptysis and wheezing.   Cardiovascular: Positive for chest pain. Negative for  palpitations, orthopnea and leg swelling.  Gastrointestinal: Positive for nausea, vomiting and abdominal pain. Negative for heartburn, diarrhea, constipation and melena.  Genitourinary: Positive for dysuria. Negative for urgency, frequency and hematuria.  Musculoskeletal: Positive for myalgias. Negative for back pain and neck pain.  Skin: Negative for rash.  Neurological: Negative for dizziness, tingling, tremors, sensory change, speech change, focal weakness and headaches.  Endo/Heme/Allergies: Does not bruise/bleed easily.  Psychiatric/Behavioral: Negative for depression.    MEDICATIONS AT HOME:   Prior to Admission medications   Medication Sig Start Date End Date Taking? Authorizing Provider  amiodarone (PACERONE) 200 MG tablet Take 1 tablet (200 mg total) by mouth daily. 02/15/14  Yes Iran Ouch, MD  apixaban (ELIQUIS) 2.5 MG TABS tablet Take 1 tablet (2.5 mg total) by mouth 2 (two) times daily. 05/08/14  Yes Antonieta Iba, MD  benazepril (LOTENSIN) 20 MG tablet Take 20 mg by mouth daily.   Yes Historical Provider, MD  carvedilol (COREG) 3.125 MG tablet Take 1 tablet (3.125 mg total) by mouth 2 (two) times daily. 02/15/14  Yes Iran Ouch, MD  gabapentin (NEURONTIN) 400 MG capsule Take 400 mg by mouth 3 (three) times daily.   Yes Historical Provider, MD  HYDROcodone-acetaminophen (NORCO/VICODIN) 5-325 MG per tablet Take 1-2 tablets by mouth 2 (two) times daily as needed for moderate pain. 09/12/14  Yes [name] Rusty Aus, NP  insulin lispro (HUMALOG) 100 UNIT/ML injection Inject 0.04 mLs (4 Units total) into the skin 3 (three) times daily before meals. Patient taking differently: Inject 10 Units into the skin 3 (three) times daily before meals.  06/18/14  Yes Alford Highland, MD  [facility] 1 G injection Inject 1 g into the muscle daily.  08/14/14  Yes Historical Provider, MD  isosorbide mononitrate (IMDUR) 60 MG 24 hr tablet Take 90 mg by mouth daily.    Yes Historical Provider, MD   LANTUS SOLOSTAR 100 UNIT/ML Solostar Pen Inject 32 Units into the skin at bedtime.  09/15/14  Yes Historical Provider, MD  levothyroxine (SYNTHROID, LEVOTHROID) 100 MCG tablet Take 100 mcg by mouth daily.    Yes Historical Provider, MD  nitroGLYCERIN (NITROSTAT) 0.4 MG SL tablet Place 1 tablet (0.4 mg total) under the tongue every 5 (five) minutes as needed for chest pain. 06/18/14  Yes Alford Highland, MD  nystatin cream (MYCOSTATIN) Apply 1 application topically 2 (two) times daily. 08/30/14  Yes [name] Rusty Aus, NP  pantoprazole (PROTONIX) 40 MG tablet Take 40 mg by mouth daily.   Yes Historical Provider, MD  potassium chloride (K-DUR) 10 MEQ tablet Take 10 mEq by mouth daily.    Yes Historical Provider, MD  pravastatin (PRAVACHOL) 40 MG tablet Take 1 tablet (40 mg total) by mouth daily.  10/03/14  Yes [name] Rusty Aus, NP  torsemide (DEMADEX) 20 MG tablet Take 40 mg in the morning and 20 mg in the afternoon. Patient taking differently: Take 20 mg by mouth 2 (two) times daily.  09/11/14  Yes Iran Ouch, MD  albuterol (PROVENTIL) (2.5 MG/3ML) 0.083% nebulizer solution Take 3 mLs (2.5 mg total) by nebulization every 6 (six) hours as needed for wheezing or shortness of breath. 09/12/14   [name] Rusty Aus, NP  docusate sodium (COLACE) 100 MG capsule Take 1 capsule (100 mg total) by mouth 2 (two) times daily. Patient not taking: Reported on 10/17/2014 09/30/14   Milagros Loll, MD  polyethylene glycol (MIRALAX / GLYCOLAX) packet Take 17 g by mouth daily as needed for mild constipation or moderate constipation. Patient not taking: Reported on 10/17/2014 09/30/14   Milagros Loll, MD      VITAL SIGNS:  Blood pressure 152/87, pulse 90, temperature 97.9 F (36.6 C), temperature source Oral, resp. rate 22, height 5\' 1"  (1.549 m), weight 65.772 kg (145 lb), SpO2 100 %.  PHYSICAL EXAMINATION:   Physical Exam  GENERAL:  81 y.o.-year-old patient lying in the bed with no acute distress.  EYES: Pupils equal,  round, reactive to light and accommodation. Post surgical pupils. No scleral icterus. Extraocular muscles intact.  HEENT: Head atraumatic, normocephalic. Oropharynx and nasopharynx clear.  NECK:  Supple, no jugular venous distention. No thyroid enlargement, no tenderness.  LUNGS: Normal breath sounds bilaterally, no wheezing, rales,rhonchi or crepitation. No use of accessory muscles of respiration. Decreased left basilar breath sounds CARDIOVASCULAR: S1, S2 normal. No rubs, or gallops. 3/6 systolic murmur heard ABDOMEN: Soft, nontender, nondistended. Bowel sounds present. No organomegaly or mass.  EXTREMITIES: No cyanosis, or clubbing. 2+ pedal edema of right foot and left leg is more swollen in general- which is chronic since her veins were removed for CABG NEUROLOGIC: Cranial nerves II through XII are intact. Muscle strength 5/5 in all extremities. Sensation intact. Gait not checked. Generalized weakness noted. PSYCHIATRIC: The patient is alert and oriented x 3.  SKIN: No obvious rash, lesion, or ulcer.   LABORATORY PANEL:   CBC  Recent Labs Lab 10/17/14 0632  WBC 7.2  HGB 9.8*  HCT 32.9*  PLT 300   ------------------------------------------------------------------------------------------------------------------  Chemistries   Recent Labs Lab 10/17/14 0632  NA 141  K 4.5  CL 100*  CO2 27  GLUCOSE 181*  BUN 34*  CREATININE 1.95*  CALCIUM 9.0  AST 91*  ALT 26  ALKPHOS 101  BILITOT 1.2   ------------------------------------------------------------------------------------------------------------------  Cardiac Enzymes  Recent Labs Lab 10/17/14 0632  TROPONINI 0.18*   ------------------------------------------------------------------------------------------------------------------  RADIOLOGY:  Dg Chest Portable 1 View  10/17/2014   CLINICAL DATA:  Dyspnea and chest tightness.  EXAM: PORTABLE CHEST - 1 VIEW  COMPARISON:  09/29/2014  FINDINGS: There is a right upper  extremity PICC line with tip in the SVC. There is moderate unchanged cardiomegaly. There is left base consolidation and effusion, unchanged. The right lung is clear.  IMPRESSION: Left base consolidation and effusion.  Cardiomegaly.   Electronically Signed   By: Ellery Plunk M.D.   On: 10/17/2014 06:20    EKG:   Orders placed or performed during the hospital encounter of 10/17/14  . ED EKG  . ED EKG  . EKG 12-Lead  . EKG 12-Lead    IMPRESSION AND PLAN:   [name]  is a 81 y.o. female with a known history of hypertension, insulin-dependent diabetes mellitus, coronary artery disease status  post bypass graft surgery, paroxysmal atrial fibrillation on eliquis, history of recurrent ESBL Escherichia coli UTI, was recently treated with Invanz up until 3 days ago using a PICC line at home, presents to the hospital secondary to worsening cough and difficulty breathing and also chest pain.  #1 Community acquired Pneumonia- blood cultures ordered - start on rocephin and azithromycin - neb treatments, o2 support  #2 ARF on CKD stage3- baseline cr around 1.7, now at 1.9 - hold torsemide for now - no IV fluids due to systolic CHF - avoid nephrotoxins  #3 Recurrent ESBL E.coli- has a picc line, it can be used - Just finished Invanz on 10/11/14 - Repeat UA and urine cultures ordered - if positive for ESBL again, will need urology f/u for cystoscopy- follows with Dr. Sampson Goon and also [facility]  #4 CAD with elevated troponin- chest now, appears more like muscular pain from coughing - Elevated troponin in light of CAD and demand ischemia, same value as last admission - no EKG changes acutely, monitor - cont cardiac meds - Monitor on tele  #5 Paroxysmal Afib- rate controlled, cont coreg - also on amiodarone, on eliquis for anticoagulation  #6 DM- cont home meds  #7 DVT Prophylaxis- on eliquis  Wheelchair bound at baseline. Physical Therapy consulted  All  the records are reviewed and case discussed with ED provider. Management plans discussed with the patient, family and they are in agreement.  CODE STATUS: FULL CODE  TOTAL TIME TAKING CARE OF THIS PATIENT: 50 minutes.    [name] M.D on 10/17/2014 at 8:15 AM  Between 7am to 6pm - Pager - [phone]  After 6pm go to [email or web address] - password [email or web address] [facility]  [place]  Office  [phone]  CC: Primary care physician; Fidel Levy, MD

## 2014-10-17 NOTE — ED Notes (Signed)
Pt arrived to the ED for complaints of shortness of breath with chest tightness. Pt states that she has also been experiencing nausea and vomiting. Pt is AOx4 in moderate distress. Dr. Manson Passey at bedside upon arrival.

## 2014-10-17 NOTE — ED Provider Notes (Signed)
[facility] Emergency Department Provider Note  ____________________________________________  Time seen: 6:15 AM  I have reviewed the triage vital signs and the nursing notes.   HISTORY  Chief Complaint Shortness of Breath     HPI ARRIANNA [name] is a 81 y.o. female presents with restaurant distress on awakening this morning. Per EMS initial O2 saturation was 80% for first responders/ firefighters nonrebreather was applied. Patient admits to subjective fevers at home chest tightness and cough. Patient was seen on 09/29/2014 and apparently admitted to the hospital for chest discomfort and upper abdominal pain. No fevers noted at that time    Past Medical History  Diagnosis Date  . DM2 (diabetes mellitus, type 2)     onset age 72 insulin dependent  . Hyperlipidemia   . COPD (chronic obstructive pulmonary disease)   . Peripheral vascular disease   . CAD (coronary artery disease)     a. MI 1988 s/p CABG in 1988, multiple PCI on SVGs; b. cath 2012: occluded native coronary arteries, patent LIMA to LAD (distal LAD dz), patent SVG-OM & occluded SVG-RCA which filled via left to right collats; c. cath 12/2013 patent LIMA-LAD & SVG-O. known occluded VG-RCA w/ L-R collats, no change since cath 2012  . Chronic systolic CHF (congestive heart failure)     a. 03/2012: EF 40-45%, mild lateral wall HK, mod inf HK, mod post wall HK, mild LVH, mild MR/TR   . Paroxysmal atrial fibrillation     a. CHADSVASc 7: yearly risk of CVA 9.6%;. b. on Eliquis 2.5 mg bid (age, SCr, borderline wt 62.9 Kg)   . HTN (hypertension)   . Yeast infection   . UTI (urinary tract infection)   . Atherosclerosis of native artery of left lower extremity   . Myocardial infarction     Patient Active Problem List   Diagnosis Date Noted  . Pressure ulcer 09/30/2014  . Ileus 09/29/2014  . Chronic left hip pain 09/12/2014  . Healthcare-associated pneumonia 08/31/2014  . UTI (lower urinary tract  infection) 08/11/2014  . Anemia 07/25/2014  . NSTEMI, initial episode of care 06/17/2014  . Degenerative arthritis of hip 04/11/2014  . Pre-operative cardiovascular examination 02/27/2014  . COPD (chronic obstructive pulmonary disease)   . DM2 (diabetes mellitus, type 2)   . Hyperlipidemia   . Chest pain 12/21/2013  . Femoral distal fracture 07/28/2013  . Chronic systolic heart failure 05/03/2013  . Congestive heart failure 04/08/2013  . CAD (coronary artery disease)   . Peripheral vascular disease   . Paroxysmal atrial fibrillation   . HTN (hypertension)   . Atherosclerosis of native arteries of the extremities with intermittent claudication 03/19/2011    Past Surgical History  Procedure Laterality Date  . Abdominal hysterectomy    . Coronary artery bypass graft    . Appendectomy    . Cholecystectomy    . Ovary surgery    . Angioplasty / stenting femoral      Bilateral SFA stenting  . Femur surgery    . Cardiac catheterization      mc  . Cardiac catheterization  12/2013    Current Outpatient Rx  Name  Route  Sig  Dispense  Refill  . albuterol (PROVENTIL) (2.5 MG/3ML) 0.083% nebulizer solution   Nebulization   Take 3 mLs (2.5 mg total) by nebulization every 6 (six) hours as needed for wheezing or shortness of breath.   150 mL   1   . amiodarone (PACERONE) 200 MG tablet  Oral   Take 1 tablet (200 mg total) by mouth daily.   90 tablet   3   . apixaban (ELIQUIS) 2.5 MG TABS tablet   Oral   Take 1 tablet (2.5 mg total) by mouth 2 (two) times daily.   60 tablet   6   . benazepril (LOTENSIN) 20 MG tablet   Oral   Take 20 mg by mouth daily.         . carvedilol (COREG) 3.125 MG tablet   Oral   Take 1 tablet (3.125 mg total) by mouth 2 (two) times daily.   180 tablet   3   . docusate sodium (COLACE) 100 MG capsule   Oral   Take 1 capsule (100 mg total) by mouth 2 (two) times daily.   60 capsule   0   . gabapentin (NEURONTIN) 400 MG capsule   Oral    Take 400 mg by mouth 3 (three) times daily.         [name] HYDROcodone-acetaminophen (NORCO/VICODIN) 5-325 MG per tablet   Oral   Take 1-2 tablets by mouth 2 (two) times daily as needed for moderate pain.   30 tablet   0   . insulin lispro (HUMALOG) 100 UNIT/ML injection   Subcutaneous   Inject 0.04 mLs (4 Units total) into the skin 3 (three) times daily before meals.   10 mL   11   . INVANZ 1 G injection   Intramuscular   Inject 1 g into the muscle daily.            Dispense as written.   . isosorbide mononitrate (IMDUR) 60 MG 24 hr tablet   Oral   Take 90 mg by mouth daily.          [name] LANTUS SOLOSTAR 100 UNIT/ML Solostar Pen   Subcutaneous   Inject 30 Units into the skin at bedtime.            Dispense as written.   [name] levothyroxine (SYNTHROID, LEVOTHROID) 100 MCG tablet   Oral   Take 100 mcg by mouth daily.          . nitroGLYCERIN (NITROSTAT) 0.4 MG SL tablet   Sublingual   Place 1 tablet (0.4 mg total) under the tongue every 5 (five) minutes as needed for chest pain.   30 tablet   0   . nystatin cream (MYCOSTATIN)   Topical   Apply 1 application topically 2 (two) times daily.   30 g   1   . pantoprazole (PROTONIX) 40 MG tablet   Oral   Take 40 mg by mouth daily.         . polyethylene glycol (MIRALAX / GLYCOLAX) packet   Oral   Take 17 g by mouth daily as needed for mild constipation or moderate constipation.   14 each   0   . potassium chloride (K-DUR) 10 MEQ tablet   Oral   Take 10 mEq by mouth daily.          . pravastatin (PRAVACHOL) 40 MG tablet   Oral   Take 1 tablet (40 mg total) by mouth daily.   90 tablet   3   . torsemide (DEMADEX) 20 MG tablet      Take 40 mg in the morning and 20 mg in the afternoon. Patient taking differently: Take 20 mg by mouth 2 (two) times daily.    180 tablet   3     Allergies Contrast media;  Tramadol; Valium; Iodine strong; and Penicillins  Family History  Problem Relation Age of Onset  .  Diabetes Mother   . Heart disease Mother   . Hypertension Mother   . Heart disease Father   . Heart disease Brother     Social History Social History  Substance Use Topics  . Smoking status: Former Smoker -- 15 years    Types: Cigarettes    Quit date: 02/18/1988  . Smokeless tobacco: Never Used  . Alcohol Use: No    Review of Systems  Constitutional: Negative for fever. Eyes: Negative for visual changes. ENT: Negative for sore throat. Cardiovascular: Positive for chest pain. Respiratory: Positive for shortness of breath. Gastrointestinal: Negative for abdominal pain, vomiting and diarrhea. Genitourinary: Negative for dysuria. Musculoskeletal: Negative for back pain. Skin: Negative for rash. Neurological: Negative for headaches, focal weakness or numbness.   10-point ROS otherwise negative.  ____________________________________________   PHYSICAL EXAM:  VITAL SIGNS: ED Triage Vitals  Enc Vitals Group     BP 10/17/14 0600 152/87 mmHg     Pulse Rate 10/17/14 0600 90     Resp 10/17/14 0600 22     Temp 10/17/14 0600 97.9 F (36.6 C)     Temp Source 10/17/14 0600 Oral     SpO2 10/17/14 0550 100 %     Weight 10/17/14 0600 145 lb (65.772 kg)     Height 10/17/14 0600 5\' 1"  (1.549 m)     Head Cir --      Peak Flow --      Pain Score 10/17/14 0603 7     Pain Loc --      Pain Edu? --      Excl. in GC? --     Constitutional: Alert and oriented. Well appearing and in no distress. Eyes: Conjunctivae are normal. PERRL. Normal extraocular movements. ENT   Head: Normocephalic and atraumatic.   Nose: No congestion/rhinnorhea.   Mouth/Throat: Mucous membranes are moist.   Neck: No stridor. Hematological/Lymphatic/Immunilogical: No cervical lymphadenopathy. Cardiovascular: Normal rate, regular rhythm. Normal and symmetric distal pulses are present in all extremities. No murmurs, rubs, or gallops. Respiratory: Tachypnea, accessory muscle use, left lower lobe  rhonchi  Gastrointestinal: Soft and nontender. No distention. There is no CVA tenderness. Genitourinary: deferred Musculoskeletal: Nontender with normal range of motion in all extremities. No joint effusions.  No lower extremity tenderness nor edema. Neurologic:  Normal speech and language. No gross focal neurologic deficits are appreciated. Speech is normal.  Skin:  Skin is warm, dry and intact. No rash noted. Psychiatric: Mood and affect are normal. Speech and behavior are normal. Patient exhibits appropriate insight and judgment.  ____________________________________________    LABS (pertinent positives/negatives)  Labs Reviewed  COMPREHENSIVE METABOLIC PANEL - Abnormal; Notable for the following:    Chloride 100 (*)    Glucose, Bld 181 (*)    BUN 34 (*)    Creatinine, Ser 1.95 (*)    AST 91 (*)    GFR calc non Af Amer 23 (*)    GFR calc Af Amer 27 (*)    All other components within normal limits  CBC WITH DIFFERENTIAL/PLATELET - Abnormal; Notable for the following:    Hemoglobin 9.8 (*)    HCT 32.9 (*)    MCV 78.8 (*)    MCH 23.5 (*)    MCHC 29.8 (*)    RDW 20.2 (*)    All other components within normal limits  FIBRIN DERIVATIVES D-DIMER ([facility] ONLY) - Abnormal; Notable for  the following:    Fibrin derivatives D-dimer ([facility]) 1990 (*)    All other components within normal limits  TROPONIN I - Abnormal; Notable for the following:    Troponin I 0.18 (*)    All other components within normal limits  CULTURE, BLOOD (ROUTINE X 2)  CULTURE, BLOOD (ROUTINE X 2)  BLOOD GAS, ARTERIAL     ____________________________________________   EKG  ED ECG REPORT I, BROWN, Ignacio N, the attending physician, personally viewed and interpreted this ECG.   Date: 10/17/2014  EKG Time: 6:02 AM  Rate: 89  Rhythm: Normal sinus rhythm right bundle branch block  Axis: None  Intervals: Normal  ST&T Change: Anterior T-wave inversions V1 through  V3   ____________________________________________    RADIOLOGY DG Chest Portable 1 View (Final result) Result time: 10/17/14 06:20:16   Final result by Rad Results In Interface (10/17/14 06:20:16)   Narrative:   CLINICAL DATA: Dyspnea and chest tightness.  EXAM: PORTABLE CHEST - 1 VIEW  COMPARISON: 09/29/2014  FINDINGS: There is a right upper extremity PICC line with tip in the SVC. There is moderate unchanged cardiomegaly. There is left base consolidation and effusion, unchanged. The right lung is clear.  IMPRESSION: Left base consolidation and effusion. Cardiomegaly.   Electronically Signed By: Ellery Plunk M.D. On: 10/17/2014 06:20           Critical Care performed: 60 minutes CRITICAL CARE Performed by: Bayard Males N   Total critical care time: 60 Minutes  Critical care time was exclusive of separately billable procedures and treating other patients.  Critical care was necessary to treat or prevent imminent or life-threatening deterioration.  Critical care was time spent personally by me on the following activities: development of treatment plan with patient and/or surrogate as well as nursing, discussions with consultants, evaluation of patient's response to treatment, examination of patient, obtaining history from patient or surrogate, ordering and performing treatments and interventions, ordering and review of laboratory studies, ordering and review of radiographic studies, pulse oximetry and re-evaluation of patient's condition. ____________________________________________   INITIAL IMPRESSION / ASSESSMENT AND PLAN / ED COURSE  Pertinent labs & imaging results that were available during my care of the patient were reviewed by me and considered in my medical decision making (see chart for details).  DuoNeb's 2 given  Reviewed the patient's x-ray performed on 09/29/2014 revealed persistent left lower lobe consolidative process.  Today's x-ray revealed left base consolidation and effusion with cardiomegaly. We'll perform a CT scan of the chest for further evaluation. Patient discussed with Dr. Tildon Husky for hospital admission  ____________________________________________   FINAL CLINICAL IMPRESSION(S) / ED DIAGNOSES  Final diagnoses:  NSTEMI (non-ST elevated myocardial infarction)  Healthcare-associated pneumonia  Acute respiratory failure with hypoxia      Darci Current, MD 10/20/14 [phone]

## 2014-10-17 NOTE — Progress Notes (Signed)
ANTIBIOTIC CONSULT NOTE - INITIAL  Pharmacy Consult for meropenem Indication: pneumonia, recurrent ESBL UTI  Allergies  Allergen Reactions  . Contrast Media [Iodinated Diagnostic Agents] Shortness Of Breath  . Tramadol Nausea Only  . Valium [Diazepam] Other (See Comments)    Reaction:  Elevated heart rate  . Iodine Strong [Iodine] Rash  . Penicillins Rash    Patient Measurements: Height:  (154.9 cm) Weight: 145 lb (65.772 kg) IBW/kg (Calculated) : 47.8  Vital Signs: Temp: 97.8 F (36.6 C) (08/30 1206) Temp Source: Oral (08/30 1206) BP: 84/51 mmHg (08/30 1415) Pulse Rate: 55 (08/30 1415)   Labs:  Recent Labs  10/17/14 0632  WBC 7.2  HGB 9.8*  PLT 300  CREATININE 1.95*   Estimated Creatinine Clearance: 19.6 mL/min (by C-G formula based on Cr of 1.95). No results for input(s): VANCOTROUGH, VANCOPEAK, VANCORANDOM, GENTTROUGH, GENTPEAK, GENTRANDOM, TOBRATROUGH, TOBRAPEAK, TOBRARND, AMIKACINPEAK, AMIKACINTROU, AMIKACIN in the last 72 hours.   Microbiology: Recent Results (from the past 720 hour(s))  Microscopic Examination     Status: Abnormal   Collection Time: 09/22/14  2:14 PM  Result Value Ref Range Status   WBC, UA >30 (A) 0 -  5 /hpf Final   RBC, UA 0-2 0 -  2 /hpf Final   Epithelial Cells (non renal) 0-10 0 - 10 /hpf Final   Renal Epithel, UA None seen None seen /hpf Final   Casts Present (A) None seen /lpf Final   Cast Type Hyaline casts N/A Final   Bacteria, UA Many (A) None seen/Few Final  CULTURE, URINE COMPREHENSIVE     Status: Abnormal   Collection Time: 09/22/14  3:18 PM  Result Value Ref Range Status   Urine Culture, Comprehensive Final report (A)  Final   Result 1 Escherichia coli (A)  Final    Comment: Greater than 100,000 colony forming units per mL   ANTIMICROBIAL SUSCEPTIBILITY Comment  Final    Comment:       ** S = Susceptible; I = Intermediate; R = Resistant **                    P = Positive; N = Negative             MICS are  expressed in micrograms per mL    Antibiotic                 RSLT#1    RSLT#2    RSLT#3    RSLT#4 Amoxicillin/Clavulanic Acid    R Ampicillin                     R Cefazolin                      R Cefepime                       R Ceftriaxone                    R Cefuroxime                     R Cephalothin                    R Ciprofloxacin                  R Ertapenem  S Gentamicin                     S Imipenem                       S Levofloxacin                   R Nitrofurantoin                 R Piperacillin                   R Tetracycline                   R Tobramycin                     R Trimethoprim/Sulfa             R   Min Inhibitory Conc (1 Drug)     Status: None   Collection Time: 09/22/14  3:18 PM  Result Value Ref Range Status   Min Inhibitory Conc (1 Drug) Final report  Final   Result 1 Escherichia coli  Final    Comment: FOSFOMYCIN    =     .75 UG/ML    = SUSCEPTIBLE     Medications:  Anti-infectives    Start     Dose/Rate Route Frequency Ordered Stop   10/17/14 1530  meropenem (MERREM) 500 mg in sodium chloride 0.9 % 50 mL IVPB     500 mg 100 mL/hr over 30 Minutes Intravenous Every 12 hours 10/17/14 1433     10/17/14 1000  azithromycin (ZITHROMAX) tablet 500 mg     500 mg Oral Daily 10/17/14 0910 10/22/14 0959   10/17/14 0915  cefTRIAXone (ROCEPHIN) 1 g in dextrose 5 % 50 mL IVPB  Status:  Discontinued     1 g 100 mL/hr over 30 Minutes Intravenous Every 24 hours 10/17/14 0910 10/17/14 1412   10/17/14 0700  levofloxacin (LEVAQUIN) IVPB 500 mg  Status:  Discontinued     500 mg 100 mL/hr over 60 Minutes Intravenous  Once 10/17/14 0646 10/17/14 0910     Assessment: Pharmacy consulted to dose meropenem for pneumonia and history of recurrent ESBL UTI. Blood and urine cultures pending.    Plan:   Dosed meropenem at 500 mg IV q12h based on patients renal function (estimated CrCl of ~20 mL/min). Pharmacy will continue to monitor renal  function and make adjustments to dose as necessary.   Jodelle Red Xariah Silvernail 10/17/2014,2:35 PM

## 2014-10-17 NOTE — Significant Event (Signed)
Paged for Rapid Response. On arrival RRT, patient's nurse, charge nurse present in room. ICU RN present. Patient pale, per staff, paler than she had been earlier. Hypostensive, BP by manual 78/42. HR 55. Increased work of breathing. MD arrived to room. Patient prepared for transport to ICU. Taken to ICU at 1350. Cardiac monitor, O2 high flow non rebreather.

## 2014-10-17 NOTE — Progress Notes (Signed)
PT Cancellation Note  Patient Details Name: [name] MRN: [id number] DOB: 1931-06-28   Cancelled Treatment:    Reason Eval/Treat Not Completed: Medical issues which prohibited therapy (Consult received and chart reviewed. Per primary RN, patient not feeling well at this time; recommends hold until next date.  Will re-attempt as medically appropriate.)   Latravious Levitt H. Manson Passey, PT, DPT, NCS 10/17/2014, 1:22 PM [phone]

## 2014-10-17 NOTE — ED Notes (Signed)
Report called to [name], RN on telemetry. Nurse tech at bedside to collect blood cultures.

## 2014-10-17 NOTE — Progress Notes (Signed)
PT Cancellation Note  Patient Details Name: [name] MRN: [id number] DOB: February 10, 1932   Cancelled Treatment:    Reason Eval/Treat Not Completed: Medical issues which prohibited therapy (Patient noted with transfer to CCU due to decline in medical status.  Will require new PT orders to initiate evaluation.  Initial order completed; please re-consult as medically appropriate.)   Rocio Roam H. Manson Passey, PT, DPT, NCS 10/17/2014, 2:45 PM [phone]

## 2014-10-18 ENCOUNTER — Inpatient Hospital Stay: Payer: Medicare Other

## 2014-10-18 DIAGNOSIS — I248 Other forms of acute ischemic heart disease: Secondary | ICD-10-CM

## 2014-10-18 DIAGNOSIS — I480 Paroxysmal atrial fibrillation: Secondary | ICD-10-CM

## 2014-10-18 DIAGNOSIS — D649 Anemia, unspecified: Secondary | ICD-10-CM

## 2014-10-18 DIAGNOSIS — I2510 Atherosclerotic heart disease of native coronary artery without angina pectoris: Secondary | ICD-10-CM

## 2014-10-18 DIAGNOSIS — J189 Pneumonia, unspecified organism: Secondary | ICD-10-CM

## 2014-10-18 LAB — CBC
HCT: 29 % — ABNORMAL LOW (ref 35.0–47.0)
Hemoglobin: 8.8 g/dL — ABNORMAL LOW (ref 12.0–16.0)
MCH: 23.1 pg — AB (ref 26.0–34.0)
MCHC: 30.2 g/dL — AB (ref 32.0–36.0)
MCV: 76.4 fL — AB (ref 80.0–100.0)
PLATELETS: 224 10*3/uL (ref 150–440)
RBC: 3.8 MIL/uL (ref 3.80–5.20)
RDW: 20.7 % — AB (ref 11.5–14.5)
WBC: 4.6 10*3/uL (ref 3.6–11.0)

## 2014-10-18 LAB — BASIC METABOLIC PANEL
Anion gap: 7 (ref 5–15)
BUN: 37 mg/dL — AB (ref 6–20)
CALCIUM: 7.9 mg/dL — AB (ref 8.9–10.3)
CHLORIDE: 103 mmol/L (ref 101–111)
CO2: 29 mmol/L (ref 22–32)
CREATININE: 2.22 mg/dL — AB (ref 0.44–1.00)
GFR calc non Af Amer: 20 mL/min — ABNORMAL LOW (ref >60)
GFR, EST AFRICAN AMERICAN: 23 mL/min — AB (ref >60)
Glucose, Bld: 95 mg/dL (ref 65–99)
Potassium: 4.7 mmol/L (ref 3.5–5.1)
Sodium: 139 mmol/L (ref 135–145)

## 2014-10-18 LAB — OCCULT BLOOD X 1 CARD TO LAB, STOOL: Fecal Occult Bld: NEGATIVE

## 2014-10-18 LAB — RETICULOCYTES
RBC.: 3.77 MIL/uL — AB (ref 3.80–5.20)
RETIC COUNT ABSOLUTE: 82.9 10*3/uL (ref 19.0–183.0)
RETIC CT PCT: 2.2 % (ref 0.4–3.1)

## 2014-10-18 LAB — FOLATE: FOLATE: 11.4 ng/mL (ref >5.9)

## 2014-10-18 LAB — GLUCOSE, CAPILLARY
GLUCOSE-CAPILLARY: 105 mg/dL — AB (ref 65–99)
GLUCOSE-CAPILLARY: 141 mg/dL — AB (ref 65–99)
Glucose-Capillary: 73 mg/dL (ref 65–99)
Glucose-Capillary: 99 mg/dL (ref 65–99)
Glucose-Capillary: 116 mg/dL — ABNORMAL HIGH (ref 65–99)

## 2014-10-18 LAB — IRON AND TIBC
Iron: 44 ug/dL (ref 28–170)
Saturation Ratios: 12 % (ref 10.4–31.8)
TIBC: 367 ug/dL (ref 250–450)
UIBC: 323 ug/dL

## 2014-10-18 LAB — VITAMIN B12: Vitamin B-12: 523 pg/mL (ref 180–914)

## 2014-10-18 LAB — TROPONIN I: Troponin I: 0.24 ng/mL — ABNORMAL HIGH (ref <0.031)

## 2014-10-18 LAB — FERRITIN: Ferritin: 24 ng/mL (ref 11–307)

## 2014-10-18 MED ORDER — AMIODARONE HCL 200 MG PO TABS
200.0000 mg | ORAL_TABLET | Freq: Every day | ORAL | Status: DC
  Administered 2014-10-18 – 2014-10-24 (×7): 200 mg via ORAL
  Filled 2014-10-18 (×7): qty 1

## 2014-10-18 MED ORDER — PANTOPRAZOLE SODIUM 40 MG IV SOLR
40.0000 mg | Freq: Two times a day (BID) | INTRAVENOUS | Status: DC
  Administered 2014-10-18 – 2014-10-20 (×4): 40 mg via INTRAVENOUS
  Filled 2014-10-18 (×4): qty 40

## 2014-10-18 NOTE — Consult Note (Signed)
Cardiology Consultation Note  Patient ID: SISTER CARBONE, MRN: [id number], DOB/AGE: [birth year] 81 y.o. Admit date: 10/17/2014   Date of Consult: 10/18/2014 Primary Physician: Fidel Levy, MD Primary Cardiologist: Dr. Kirke Corin, MD  Chief Complaint: Increased SOB Reason for Consult: Elevated troponin   HPI: 81 y.o. female with h/o CAD s/p CABG in 1988, PAF on Eliquis, ischemic cardiomyopathy, COPD, PAD/PVD, DM2, HTN, and HLD who has been recently admitted to [facility] in mid June, mid July for ESBL UTI, HCAP, and acute on chronic systolic CHF, and mid August for Ileus presented to [facility] again on 8/30 with increased SOB and was found to have a left base pneumonia, consistent with HCAP.   In April she was hospitalized for NSTEMI and underwent cardiac cath that showed occluded native vessels with patent LIMA to LAD and SVG to OM. The SVG to RCA is chronically occluded and fills via left-to-right collaterals. There was diffuse disease in the distal LAD beyond the anastomosis. Ejection fraction was 50 % by echo. There was no significant change from previous cardiac catheterization in 2012.  She has recently been hospitalized in June for ESBL UTI that was successfully treated. Follow up urine culture for clearing in mid July showed continued ESBL infection. She was admitted in mid July with HCAP and acute on chronic systolic CHF. Troponin with trend of 0.58-->0.59-->0.77-->1.74. This was felt to be 2/2 her underlying comorbidities. She was diuresed successfully and discharged home.   She returned to [facility] in mid August with an ileus. Mild troponin elevation at 0.09. No angina. She was successfully treated.   She presented again to [facility] on 8/30 with increased SOB and was found to have recurrent LLL PNA c/w HCAP given her recent hospitalizations. She reports increased cough that has been mostly nonproductive. No angina, diaphoresis, nausea, vomiting, presyncope, or syncope. Her mobility is limited by her  chronic hip pain. Weight has been stable. Upon her presentation her troponin was found to be 0.14-->0.18-->0.27-->0.24. ECG non acute. CXR with left base consolidation and effusion. She has been on her baseline 2L O2 via nasal cannula which she is on at nighttime. SCr was found to be 1.95-->2.22, hgb 9.8-->8.8. She was placed on azithromycin, Rocephin, and meropenem. She became hypotensive in the 80s systolic on 8/30 requiring a rapid response and IV fluid bolus and transfer to ICU. Since then her BP has remained stable. Heart rates have remained controlled. This afternoon she does not have any complaints.   She has previously been scheduled for left hip surgery, but this has been canceled 2/2 her above SOB, ESBL UTI hospitalizations, and and recurrent HCAP. At this time it has not been rescheduled. She continues to have hip pain.      Past Medical History  Diagnosis Date  . DM2 (diabetes mellitus, type 2)     onset age 25 insulin dependent  . Hyperlipidemia   . COPD (chronic obstructive pulmonary disease)   . Peripheral vascular disease   . CAD (coronary artery disease)     a. MI 1988 s/p CABG in 1988, multiple PCI on SVGs; b. cath 2012: occluded native coronary arteries, patent LIMA to LAD (distal LAD dz), patent SVG-OM & occluded SVG-RCA which filled via left to right collats; c. cath 12/2013 patent LIMA-LAD & SVG-O. known occluded VG-RCA w/ L-R collats, no change since cath 2012  . Chronic systolic CHF (congestive heart failure)     a. 03/2012: EF 40-45%, mild lateral wall HK, mod inf HK, mod post wall  HK, mild LVH, mild MR/TR   . Paroxysmal atrial fibrillation     a. CHADSVASc 7: yearly risk of CVA 9.6%;. b. on Eliquis 2.5 mg bid (age, SCr, borderline wt 62.9 Kg)   . HTN (hypertension)   . Yeast infection   . UTI (urinary tract infection)     Recurrent ESBL E.coli UTI  . Atherosclerosis of native artery of left lower extremity   . Myocardial infarction   . CKD (chronic kidney disease)  stage 3, GFR 30-59 ml/min   . Hypoxia     on nocturnal o2      Most Recent Cardiac Studies: As above   Surgical History:  Past Surgical History  Procedure Laterality Date  . Abdominal hysterectomy    . Coronary artery bypass graft    . Appendectomy    . Cholecystectomy    . Ovary surgery    . Angioplasty / stenting femoral      Bilateral SFA stenting  . Femur surgery    . Cardiac catheterization      mc  . Cardiac catheterization  12/2013  . Right hip replacement    . Total knee arthroplasty      right knee     Home Meds: Prior to Admission medications   Medication Sig Start Date End Date Taking? Authorizing Provider  amiodarone (PACERONE) 200 MG tablet Take 1 tablet (200 mg total) by mouth daily. 02/15/14  Yes Iran Ouch, MD  apixaban (ELIQUIS) 2.5 MG TABS tablet Take 1 tablet (2.5 mg total) by mouth 2 (two) times daily. 05/08/14  Yes Antonieta Iba, MD  benazepril (LOTENSIN) 20 MG tablet Take 20 mg by mouth daily.   Yes Historical Provider, MD  carvedilol (COREG) 3.125 MG tablet Take 1 tablet (3.125 mg total) by mouth 2 (two) times daily. 02/15/14  Yes Iran Ouch, MD  gabapentin (NEURONTIN) 400 MG capsule Take 400 mg by mouth 3 (three) times daily.   Yes Historical Provider, MD  HYDROcodone-acetaminophen (NORCO/VICODIN) 5-325 MG per tablet Take 1-2 tablets by mouth 2 (two) times daily as needed for moderate pain. 09/12/14  Yes [name] Rusty Aus, NP  insulin lispro (HUMALOG) 100 UNIT/ML injection Inject 0.04 mLs (4 Units total) into the skin 3 (three) times daily before meals. Patient taking differently: Inject 10 Units into the skin 3 (three) times daily before meals.  06/18/14  Yes Alford Highland, MD  [facility] 1 G injection Inject 1 g into the muscle daily.  08/14/14  Yes Historical Provider, MD  isosorbide mononitrate (IMDUR) 60 MG 24 hr tablet Take 90 mg by mouth daily.    Yes Historical Provider, MD  LANTUS SOLOSTAR 100 UNIT/ML Solostar Pen Inject 32 Units into  the skin at bedtime.  09/15/14  Yes Historical Provider, MD  levothyroxine (SYNTHROID, LEVOTHROID) 100 MCG tablet Take 100 mcg by mouth daily.    Yes Historical Provider, MD  nitroGLYCERIN (NITROSTAT) 0.4 MG SL tablet Place 1 tablet (0.4 mg total) under the tongue every 5 (five) minutes as needed for chest pain. 06/18/14  Yes Alford Highland, MD  nystatin cream (MYCOSTATIN) Apply 1 application topically 2 (two) times daily. 08/30/14  Yes [name] Rusty Aus, NP  pantoprazole (PROTONIX) 40 MG tablet Take 40 mg by mouth daily.   Yes Historical Provider, MD  potassium chloride (K-DUR) 10 MEQ tablet Take 10 mEq by mouth daily.    Yes Historical Provider, MD  pravastatin (PRAVACHOL) 40 MG tablet Take 1 tablet (40 mg total) by mouth daily. 10/03/14  Yes [name] Rusty Aus, NP  torsemide (DEMADEX) 20 MG tablet Take 40 mg in the morning and 20 mg in the afternoon. Patient taking differently: Take 20 mg by mouth 2 (two) times daily.  09/11/14  Yes Iran Ouch, MD  albuterol (PROVENTIL) (2.5 MG/3ML) 0.083% nebulizer solution Take 3 mLs (2.5 mg total) by nebulization every 6 (six) hours as needed for wheezing or shortness of breath. 09/12/14   [name] Rusty Aus, NP  docusate sodium (COLACE) 100 MG capsule Take 1 capsule (100 mg total) by mouth 2 (two) times daily. Patient not taking: Reported on 10/17/2014 09/30/14   Milagros Loll, MD  polyethylene glycol (MIRALAX / GLYCOLAX) packet Take 17 g by mouth daily as needed for mild constipation or moderate constipation. Patient not taking: Reported on 10/17/2014 09/30/14   Milagros Loll, MD    Inpatient Medications:  . azithromycin  500 mg Oral Daily  . gabapentin  400 mg Oral TID  . insulin aspart  0-5 Units Subcutaneous QHS  . insulin aspart  0-9 Units Subcutaneous TID WC  . levothyroxine  100 mcg Oral QAC breakfast  . meropenem (MERREM) IV  500 mg Intravenous Q12H  . nystatin cream  1 application Topical BID  . pantoprazole (PROTONIX) IV  40 mg Intravenous Q12H  .  pravastatin  40 mg Oral Daily  . sodium chloride  3 mL Intravenous Q12H      Allergies:  Allergies  Allergen Reactions  . Contrast Media [Iodinated Diagnostic Agents] Shortness Of Breath  . Tramadol Nausea Only  . Valium [Diazepam] Other (See Comments)    Reaction:  Elevated heart rate  . Iodine Strong [Iodine] Rash  . Penicillins Rash    Social History   Social History  . Marital Status: Widowed    Spouse Name: N/A  . Number of Children: N/A  . Years of Education: N/A   Occupational History  . Not on file.   Social History Main Topics  . Smoking status: Former Smoker -- 15 years    Types: Cigarettes    Quit date: 02/18/1988  . Smokeless tobacco: Never Used  . Alcohol Use: No  . Drug Use: No  . Sexual Activity: No   Other Topics Concern  . Not on file   Social History Narrative   Lives at home with granddaughter. Has a wheelchair.     Family History  Problem Relation Age of Onset  . Diabetes Mother   . Heart disease Mother   . Hypertension Mother   . Heart disease Father   . Heart disease Brother   . CVA Father      Review of Systems: Review of Systems  Constitutional: Positive for weight loss and malaise/fatigue. Negative for fever, chills and diaphoresis.  HENT: Positive for congestion.   Eyes: Negative for discharge and redness.  Respiratory: Positive for cough, shortness of breath and wheezing. Negative for hemoptysis and sputum production.   Cardiovascular: Positive for leg swelling. Negative for chest pain, palpitations, orthopnea, claudication and PND.  Gastrointestinal: Negative for heartburn, nausea, vomiting, abdominal pain, blood in stool and melena.  Genitourinary: Negative for hematuria.  Musculoskeletal: Negative for myalgias and falls.       Unable to ambulate 2/2 hip pain   Skin: Negative for rash.  Neurological: Positive for weakness. Negative for sensory change, speech change and focal weakness.  Endo/Heme/Allergies: Does not  bruise/bleed easily.  Psychiatric/Behavioral: The patient is not nervous/anxious.   All other systems reviewed and are negative.  Labs:  Recent Labs  10/17/14 1125 10/17/14 1419 10/17/14 2117 10/18/14 1037  CKMB 2.1 2.1 2.6  --   TROPONINI 0.14* 0.18* 0.27* 0.24*   Lab Results  Component Value Date   WBC 4.6 10/18/2014   HGB 8.8* 10/18/2014   HCT 29.0* 10/18/2014   MCV 76.4* 10/18/2014   PLT 224 10/18/2014    Recent Labs Lab 10/17/14 0632  10/18/14 0358  NA 141  < > 139  K 4.5  < > 4.7  CL 100*  < > 103  CO2 27  < > 29  BUN 34*  < > 37*  CREATININE 1.95*  < > 2.22*  CALCIUM 9.0  < > 7.9*  PROT 7.1  --   --   BILITOT 1.2  --   --   ALKPHOS 101  --   --   ALT 26  --   --   AST 91*  --   --   GLUCOSE 181*  < > 95  < > = values in this interval not displayed. Lab Results  Component Value Date   CHOL 129 06/16/2014   HDL 33* 06/16/2014   LDLCALC 55 06/16/2014   TRIG 206* 06/16/2014   Lab Results  Component Value Date   DDIMER 0.93* 11/15/2010    Radiology/Studies:  Dg Chest 2 View  09/29/2014   CLINICAL DATA:  Substernal chest pain and abdominal pain since yesterday.  EXAM: CHEST - 2 VIEW; ABDOMEN - 2 VIEW  COMPARISON:  Chest x-ray 09/03/2014  FINDINGS: Chest x-ray:  The right PICC line is stable. Stable cardiac enlargement and tortuous ectatic and calcified thoracic aorta. There are chronic lung changes with possible superimposed interstitial edema or bronchitis. There is a persistent left lower lobe process which is likely a combination of effusion and atelectasis or infiltrate.  Two-view abdomen:  The abdominal bowel gas pattern suggests a mild diffuse ileus or possible gastroenteritis. There are air-filled small bowel loops without distention and some scattered air and stool throughout the colon. No free air. The soft tissue shadows are grossly maintained. Extensive vascular calcifications are noted. The bony structures are grossly intact. There is a right hip  prosthesis and severe degenerative changes involving the left hip.  IMPRESSION: 1. Persistent left lower lobe process likely a combination of effusion, atelectasis and/or infiltrate. 2. Underline chronic lung changes. 3. Stable right PICC line. 4. Abdominal bowel gas pattern suggests a mild ileus or gastroenteritis. No findings for small bowel obstruction or free air.   Electronically Signed   By: Rudie Meyer M.D.   On: 09/29/2014 09:23   Dg Chest Port 1 View  10/18/2014   CLINICAL DATA:  Evaluate pneumonia  EXAM: PORTABLE CHEST - 1 VIEW  COMPARISON:  Yesterday  FINDINGS: Unchanged cardiopericardial enlargement. Stable mediastinal contours with distortion from leftward rotation.  Right upper extremity PICC, tip stable at the SVC level.  Unchanged diffuse interstitial coarsening and dense retrocardiac opacity with air bronchograms and hazy appearance superiorly.  Remote left humeral neck fracture.  IMPRESSION: 1. Unchanged retrocardiac pneumonia or atelectasis with effusion. 2. Stable cardiomegaly and mild pulmonary edema.   Electronically Signed   By: Marnee Spring M.D.   On: 10/18/2014 08:49   Dg Chest Portable 1 View  10/17/2014   CLINICAL DATA:  Dyspnea and chest tightness.  EXAM: PORTABLE CHEST - 1 VIEW  COMPARISON:  09/29/2014  FINDINGS: There is a right upper extremity PICC line with tip in the SVC. There is moderate unchanged cardiomegaly.  There is left base consolidation and effusion, unchanged. The right lung is clear.  IMPRESSION: Left base consolidation and effusion.  Cardiomegaly.   Electronically Signed   By: Ellery Plunk M.D.   On: 10/17/2014 06:20   Dg Abd 2 Views  09/30/2014   CLINICAL DATA:  Abdominal pain, tightness, burping and belching with nausea  EXAM: ABDOMEN - 2 VIEW  COMPARISON:  09/29/2014  FINDINGS: The bowel gas pattern is normal. There is no evidence of free air. No radio-opaque calculi or other significant radiographic abnormality is seen.  Partially visualize is left  lower lobe airspace disease and a small left pleural effusion. There is stable cardiomegaly. There is evidence of prior CABG.  Right total hip arthroplasty. Severe osteoarthritis of the left hip.  IMPRESSION: 1. No bowel obstruction. 2. Severe osteoarthritis of the left hip.   Electronically Signed   By: Elige Ko   On: 09/30/2014 10:08   Dg Abd 2 Views  09/29/2014   CLINICAL DATA:  Substernal chest pain and abdominal pain since yesterday.  EXAM: CHEST - 2 VIEW; ABDOMEN - 2 VIEW  COMPARISON:  Chest x-ray 09/03/2014  FINDINGS: Chest x-ray:  The right PICC line is stable. Stable cardiac enlargement and tortuous ectatic and calcified thoracic aorta. There are chronic lung changes with possible superimposed interstitial edema or bronchitis. There is a persistent left lower lobe process which is likely a combination of effusion and atelectasis or infiltrate.  Two-view abdomen:  The abdominal bowel gas pattern suggests a mild diffuse ileus or possible gastroenteritis. There are air-filled small bowel loops without distention and some scattered air and stool throughout the colon. No free air. The soft tissue shadows are grossly maintained. Extensive vascular calcifications are noted. The bony structures are grossly intact. There is a right hip prosthesis and severe degenerative changes involving the left hip.  IMPRESSION: 1. Persistent left lower lobe process likely a combination of effusion, atelectasis and/or infiltrate. 2. Underline chronic lung changes. 3. Stable right PICC line. 4. Abdominal bowel gas pattern suggests a mild ileus or gastroenteritis. No findings for small bowel obstruction or free air.   Electronically Signed   By: Rudie Meyer M.D.   On: 09/29/2014 09:23   Dg Abd Portable 1v  10/17/2014   CLINICAL DATA:  Nausea and vomiting.  EXAM: PORTABLE ABDOMEN - 1 VIEW  COMPARISON:  09/30/2014 abdominal radiographs.  FINDINGS: Portable supine abdominal radiographs demonstrate no disproportionately  dilated small bowel loops to suggest a small bowel obstruction. No evidence of pneumatosis or pneumoperitoneum. Cholecystectomy clips are seen in the right upper quadrant. Visualized median sternotomy wires are aligned and intact. Stable cardiomegaly and likely small left pleural effusion. Partially visualized right total hip arthroplasty, the visualized portions of which appear well-positioned. Stable moderate degenerative changes in the visualized thoracolumbar spine and severe osteoarthritis in the weight-bearing portion of the left hip joint. Prominent vascular calcifications in the abdominal aorta and branch vessels.  IMPRESSION: 1. Nonobstructive bowel gas pattern. 2. Cardiomegaly and small left pleural effusion.   Electronically Signed   By: Delbert Phenix M.D.   On: 10/17/2014 17:28    EKG: NSR, 89 bpm, RBBB, poor R wave progression, nonspecific inferolateral st/t changes, anterior TWI  Weights: Filed Weights   10/17/14 0600  Weight: 145 lb (65.772 kg)     Physical Exam: Blood pressure 114/56, pulse 63, temperature 97.8 F (36.6 C), temperature source Oral, resp. rate 15, height  (1.549 m), weight 145 lb (65.772 kg), SpO2 92 %.  Body mass index is 27.41 kg/(m^2). General: Well developed, well nourished, in no acute distress. Head: Normocephalic, atraumatic, sclera non-icteric, no xanthomas, nares are without discharge.  Neck: Negative for carotid bruits. JVD not elevated. Lungs: Rhonchi left base with decreased breath sounds throughout. Breathing is unlabored. Heart: RRR with S1 S2. II/Vi systolic murmur. No rubs, or gallops appreciated. Abdomen: Soft, non-tender, non-distended with normoactive bowel sounds. No hepatomegaly. No rebound/guarding. No obvious abdominal masses. Msk:  Strength and tone appear normal for age. Extremities: No clubbing or cyanosis. No edema.  Distal pedal pulses are 2+ and equal bilaterally. Neuro: Alert and oriented X 3. No facial asymmetry. No focal  deficit. Moves all extremities spontaneously. Psych:  Responds to questions appropriately with a normal affect.    Assessment and Plan:  81 y.o. female with h/o CAD s/p CABG in 1988, PAF on Eliquis, ischemic cardiomyopathy, COPD, PAD/PVD, DM2, HTN, and HLD who has been recently admitted to [facility] in mid June, mid July for ESBL UTI, HCAP, and acute on chronic systolic CHF, and mid August for Ileus presented to [facility] again on 8/30 with increased SOB and was found to have a left base pneumonia, consistent with HCAP.   1. Elevated troponin/CAD s/p CABG: -Mildly elevated and generally flat trending, not as elevated as prior admission in mid July when troponin peaked at 1.74 in similar setting of PNA, ESBL UTI, and sepsis -Likely supply demand ischemia in the setting of HCAP, sepsis, acute on CKD stage III, anemia, and possible acute on chronic systolic CHF -No further ischemic evaluation at this time given the above recent cardiac catheterization  -No angina  -Imdur held given her hypotension - restart when able  -Continue Coreg 3.125 mg bid, pravastatin 40 mg   2. HCAP with sepsis: -4th admission since June for either ESBL UTI or PNA -Hypotensive on 8/30 requiring rapid response team, IV fluids given, improved, no pressors -ABX per IM--> for HCAP -Nebs per IM  3. Recurrent ESBL UTI: -Has PICC line in place -UA from 8/30 with TNTC WBC/HPF -Urine culture pending -Has been following with ID  4. PAF:  -Currently in sinus -On Eliquis 2.5 mg bid -Would continue amiodarone 200 mg daily as she is at significantly increased risk of going into Afib with RVR given her PNA -CHADSVASc at least 7 giving her an estimated annual stroke risk of 9.6%  5. Acute on chronic anemia: -Status post transfusion 1 unit pRBC -Await stool for occult blood  6. Acute on CKD stage III:  -Likely ATN in the setting of hypotension -BP improved -Follow renal function    Elinor Dodge, PA-C Pager: [phone] 10/18/2014, 1:29 PM

## 2014-10-18 NOTE — Progress Notes (Signed)
Patient alert and oriented, vss.  SR with BBB and 1 degree HB on monitor.  Patient resting between care.  Currently in no apparent distress. See CHL for further details.

## 2014-10-18 NOTE — Progress Notes (Signed)
ANTIBIOTIC CONSULT NOTE - INITIAL  Pharmacy Consult for meropenem Indication: pneumonia, recurrent ESBL UTI  Allergies  Allergen Reactions  . Contrast Media [Iodinated Diagnostic Agents] Shortness Of Breath  . Tramadol Nausea Only  . Valium [Diazepam] Other (See Comments)    Reaction:  Elevated heart rate  . Iodine Strong [Iodine] Rash  . Penicillins Rash    Patient Measurements: Height: 5\' 1"  (154.9 cm) Weight: 145 lb (65.772 kg) IBW/kg (Calculated) : 47.8  Vital Signs: Temp: 97.5 F (36.4 C) (08/31 0400) Temp Source: Oral (08/31 0400) BP: 116/65 mmHg (08/31 0800) Pulse Rate: 59 (08/31 0800)   Labs:  Recent Labs  10/17/14 0632 10/17/14 1419 10/18/14 0358  WBC 7.2 4.2 4.6  HGB 9.8* 7.7* 8.8*  PLT 300 212 224  CREATININE 1.95* 2.07* 2.22*   Estimated Creatinine Clearance: 17.3 mL/min (by C-G formula based on Cr of 2.22). No results for input(s): VANCOTROUGH, VANCOPEAK, VANCORANDOM, GENTTROUGH, GENTPEAK, GENTRANDOM, TOBRATROUGH, TOBRAPEAK, TOBRARND, AMIKACINPEAK, AMIKACINTROU, AMIKACIN in the last 72 hours.   Microbiology: Recent Results (from the past 720 hour(s))  Microscopic Examination     Status: Abnormal   Collection Time: 09/22/14  2:14 PM  Result Value Ref Range Status   WBC, UA >30 (A) 0 -  5 /hpf Final   RBC, UA 0-2 0 -  2 /hpf Final   Epithelial Cells (non renal) 0-10 0 - 10 /hpf Final   Renal Epithel, UA None seen None seen /hpf Final   Casts Present (A) None seen /lpf Final   Cast Type Hyaline casts N/A Final   Bacteria, UA Many (A) None seen/Few Final  CULTURE, URINE COMPREHENSIVE     Status: Abnormal   Collection Time: 09/22/14  3:18 PM  Result Value Ref Range Status   Urine Culture, Comprehensive Final report (A)  Final   Result 1 Escherichia coli (A)  Final    Comment: Greater than 100,000 colony forming units per mL   ANTIMICROBIAL SUSCEPTIBILITY Comment  Final    Comment:       ** S = Susceptible; I = Intermediate; R = Resistant **                     P = Positive; N = Negative             MICS are expressed in micrograms per mL    Antibiotic                 RSLT#1    RSLT#2    RSLT#3    RSLT#4 Amoxicillin/Clavulanic Acid    R Ampicillin                     R Cefazolin                      R Cefepime                       R Ceftriaxone                    R Cefuroxime                     R Cephalothin                    R Ciprofloxacin                  R Ertapenem  S Gentamicin                     S Imipenem                       S Levofloxacin                   R Nitrofurantoin                 R Piperacillin                   R Tetracycline                   R Tobramycin                     R Trimethoprim/Sulfa             R   Min Inhibitory Conc (1 Drug)     Status: None   Collection Time: 09/22/14  3:18 PM  Result Value Ref Range Status   Min Inhibitory Conc (1 Drug) Final report  Final   Result 1 Escherichia coli  Final    Comment: FOSFOMYCIN    =     .75 UG/ML    = SUSCEPTIBLE  Blood culture (routine x 2)     Status: None (Preliminary result)   Collection Time: 10/17/14  9:07 AM  Result Value Ref Range Status   Specimen Description BLOOD LEFT ASSIST CONTROL  Final   Special Requests BOTTLES DRAWN AEROBIC AND ANAEROBIC  1 CC  Final   Culture NO GROWTH 1 DAY  Final   Report Status PENDING  Incomplete  Blood culture (routine x 2)     Status: None (Preliminary result)   Collection Time: 10/17/14  9:07 AM  Result Value Ref Range Status   Specimen Description BLOOD RIGHT ASSIST CONTROL  Final   Special Requests BOTTLES DRAWN AEROBIC AND ANAEROBIC  1 CC  Final   Culture NO GROWTH 1 DAY  Final   Report Status PENDING  Incomplete  MRSA PCR Screening     Status: None   Collection Time: 10/17/14  5:51 PM  Result Value Ref Range Status   MRSA by PCR NEGATIVE NEGATIVE Final    Comment:        The GeneXpert MRSA Assay (FDA approved for NASAL specimens only), is one component of  a comprehensive MRSA colonization surveillance program. It is not intended to diagnose MRSA infection nor to guide or monitor treatment for MRSA infections.      Medications:  Anti-infectives    Start     Dose/Rate Route Frequency Ordered Stop   10/17/14 1530  meropenem (MERREM) 500 mg in sodium chloride 0.9 % 50 mL IVPB     500 mg 100 mL/hr over 30 Minutes Intravenous Every 12 hours 10/17/14 1433     10/17/14 1000  azithromycin (ZITHROMAX) tablet 500 mg     500 mg Oral Daily 10/17/14 0910 10/22/14 0959   10/17/14 0915  cefTRIAXone (ROCEPHIN) 1 g in dextrose 5 % 50 mL IVPB  Status:  Discontinued     1 g 100 mL/hr over 30 Minutes Intravenous Every 24 hours 10/17/14 0910 10/17/14 1412   10/17/14 0700  levofloxacin (LEVAQUIN) IVPB 500 mg  Status:  Discontinued     500 mg 100 mL/hr over 60 Minutes Intravenous  Once 10/17/14 0646 10/17/14 0910     Assessment: Pharmacy  consulted to dose meropenem for pneumonia and history of recurrent ESBL UTI. Blood and urine cultures pending.    Plan:   Will continue meropenem at 500 mg IV q12h. Pharmacy will continue to monitor renal function and make adjustments to dose as necessary.   Luisa Hart D 10/18/2014,11:07 AM

## 2014-10-18 NOTE — Progress Notes (Signed)
Dr Nemiah Commander at bedside, RN ask MD regarding patient's amio and bp meds as patient has been brady most of last night and bp borderline low although improving.  RN also asked about cardio consult for patient since troponin as been positive.  Patient also is NPO, requesting diet.  MD states to hold amio and bp meds, MD said patient normally has positive troponin, no cardio consult needed.  MD states that she will order clear liquid with carb control diet.

## 2014-10-18 NOTE — Progress Notes (Signed)
Initial Nutrition Assessment     INTERVENTION:  Meals and snacks: Await diet progression after tolerance of clear liquids Nutrition Supplement Therapy: Recommend boost breeze TID for added nutrition    NUTRITION DIAGNOSIS:   Inadequate oral intake related to acute illness as evidenced by NPO status.    GOAL:   Patient will meet greater than or equal to 90% of their needs    MONITOR:    (Energy intake, Glucose profile, Electrolyte and renal profile)  REASON FOR ASSESSMENT:   Malnutrition Screening Tool    ASSESSMENT:      Pt admitted with shortness of breath, pneumonia, hypotension, ARF   Current Nutrition: NPO, just progressed to clear liquids  Food/Nutrition-Related History: unable to speak with pt times 2 attempts this am.  Noted per MST poor po intake prior to admission   Medications: colace, aspart, nystatin, miralax, senna  Electrolyte/Renal Profile and Glucose Profile:   Recent Labs Lab 10/17/14 0632 10/17/14 1419 10/18/14 0358  NA 141 140 139  K 4.5 4.8 4.7  CL 100* 102 103  CO2 BUN 34* 34* 37*  CREATININE 1.95* 2.07* 2.22*  CALCIUM 9.0 8.0* 7.9*  GLUCOSE 181* 154* 95   Protein Profile:  Recent Labs Lab 10/17/14 0632  ALBUMIN 3.8    Gastrointestinal Profile:  Last BM:8/29   Nutrition-Focused Physical Exam Findings:  Unable to complete Nutrition-Focused physical exam at this time.     Weight Change: per wt encounters no wt loss prior to admissioon    Diet Order:  Diet clear liquid Room service appropriate?: Yes; Fluid consistency:: Thin  Skin:   pressure ulcer stage II noted       Height:   Ht Readings from Last 1 Encounters:  10/17/14  (1.549 m)    Weight:   Wt Readings from Last 1 Encounters:  10/17/14 145 lb (65.772 kg)    BMI:  Body mass index is 27.41 kg/(m^2).  Estimated Nutritional Needs:   Kcal:  Using IBW of 48kg (BEE 882 kcals (IF 1.0-1.3, aF 1.3) 1610-9604 kcals/d  Protein:  Using  IBW of 48kg (1.1-1.3 g/kg) 53-62 g/d  Fluid:  Using IBW of 48kg (1440-1632ml/d)  EDUCATION NEEDS:   No education needs identified at this time  MODERATE Care Level  [name], RD, LDN [phone] (pager)

## 2014-10-18 NOTE — Progress Notes (Signed)
[facility] [place] [facility]   PATIENT NAME: [name]    MR#:  [id number]  DATE OF BIRTH:  07-30-1931  SUBJECTIVE:  CHIEF COMPLAINT:   Chief Complaint  Patient presents with  . Shortness of Breath   Patient admitted for pneumonia. Became hypotensive on the floor and hypoxic. -Moved to ICU, received fluid bolus and also 1 unit of blood yesterday. -Continues to have minimal epigastric pain and also nausea. -Blood pressure is stable, did not require any pressors.  REVIEW OF SYSTEMS:  Review of Systems  Constitutional: Negative for fever and chills.  HENT: Negative for ear discharge, hearing loss and tinnitus.   Eyes: Negative for blurred vision and double vision.  Respiratory: Positive for cough and shortness of breath. Negative for wheezing.   Cardiovascular: Negative for chest pain and palpitations.  Gastrointestinal: Positive for heartburn, nausea and abdominal pain. Negative for vomiting, diarrhea and constipation.  Genitourinary: Negative for dysuria.  Neurological: Negative for dizziness, tingling, sensory change, speech change, seizures and headaches.  Psychiatric/Behavioral: Negative for depression. The patient is not nervous/anxious.     DRUG ALLERGIES:   Allergies  Allergen Reactions  . Contrast Media [Iodinated Diagnostic Agents] Shortness Of Breath  . Tramadol Nausea Only  . Valium [Diazepam] Other (See Comments)    Reaction:  Elevated heart rate  . Iodine Strong [Iodine] Rash  . Penicillins Rash    VITALS:  Blood pressure 116/65, pulse 59, temperature 97.5 F (36.4 C), temperature source Oral, resp. rate 13, height  (1.549 m), weight 65.772 kg (145 lb), SpO2 96 %.  PHYSICAL EXAMINATION:  Physical Exam  GENERAL: 81 y.o.-year-old patient lying in the bed with no acute distress. Pale appearing. EYES: Pupils equal, round, reactive to light and accommodation. Post surgical pupils. No scleral icterus. Extraocular  muscles intact.  HEENT: Head atraumatic, normocephalic. Oropharynx and nasopharynx clear.  NECK: Supple, no jugular venous distention. No thyroid enlargement, no tenderness.  LUNGS: Normal breath sounds bilaterally, no wheezing, rales,rhonchi or crepitation. No use of accessory muscles of respiration. Decreased left basilar breath sounds with fine crackles today. CARDIOVASCULAR: S1, S2 normal. No rubs, or gallops. 3/6 systolic murmur heard ABDOMEN: Soft, nontender, nondistended. Bowel sounds present. No organomegaly or mass.  EXTREMITIES: No cyanosis, or clubbing. 2+ pedal edema of right foot and left leg is more swollen in general- which is chronic since her veins were removed for CABG NEUROLOGIC: Cranial nerves II through XII are intact. Muscle strength 5/5 in all extremities. Sensation intact. Gait not checked. Generalized weakness noted. PSYCHIATRIC: The patient is alert and oriented x 3.  SKIN: No obvious rash, lesion, or ulcer.   LABORATORY PANEL:   CBC  Recent Labs Lab 10/18/14 0358  WBC 4.6  HGB 8.8*  HCT 29.0*  PLT 224   ------------------------------------------------------------------------------------------------------------------  Chemistries   Recent Labs Lab 10/17/14 0632  10/18/14 0358  NA 141  < > 139  K 4.5  < > 4.7  CL 100*  < > 103  CO2 27  < > 29  GLUCOSE 181*  < > 95  BUN 34*  < > 37*  CREATININE 1.95*  < > 2.22*  CALCIUM 9.0  < > 7.9*  AST 91*  --   --   ALT 26  --   --   ALKPHOS 101  --   --   BILITOT 1.2  --   --   < > = values in this interval not displayed. ------------------------------------------------------------------------------------------------------------------  Cardiac Enzymes  Recent Labs Lab 10/17/14 2117  TROPONINI 0.27*   ------------------------------------------------------------------------------------------------------------------  RADIOLOGY:  Dg Chest Port 1 View  10/18/2014   CLINICAL DATA:  Evaluate  pneumonia  EXAM: PORTABLE CHEST - 1 VIEW  COMPARISON:  Yesterday  FINDINGS: Unchanged cardiopericardial enlargement. Stable mediastinal contours with distortion from leftward rotation.  Right upper extremity PICC, tip stable at the SVC level.  Unchanged diffuse interstitial coarsening and dense retrocardiac opacity with air bronchograms and hazy appearance superiorly.  Remote left humeral neck fracture.  IMPRESSION: 1. Unchanged retrocardiac pneumonia or atelectasis with effusion. 2. Stable cardiomegaly and mild pulmonary edema.   Electronically Signed   By: Marnee Spring M.D.   On: 10/18/2014 08:49   Dg Chest Portable 1 View  10/17/2014   CLINICAL DATA:  Dyspnea and chest tightness.  EXAM: PORTABLE CHEST - 1 VIEW  COMPARISON:  09/29/2014  FINDINGS: There is a right upper extremity PICC line with tip in the SVC. There is moderate unchanged cardiomegaly. There is left base consolidation and effusion, unchanged. The right lung is clear.  IMPRESSION: Left base consolidation and effusion.  Cardiomegaly.   Electronically Signed   By: Ellery Plunk M.D.   On: 10/17/2014 06:20   Dg Abd Portable 1v  10/17/2014   CLINICAL DATA:  Nausea and vomiting.  EXAM: PORTABLE ABDOMEN - 1 VIEW  COMPARISON:  09/30/2014 abdominal radiographs.  FINDINGS: Portable supine abdominal radiographs demonstrate no disproportionately dilated small bowel loops to suggest a small bowel obstruction. No evidence of pneumatosis or pneumoperitoneum. Cholecystectomy clips are seen in the right upper quadrant. Visualized median sternotomy wires are aligned and intact. Stable cardiomegaly and likely small left pleural effusion. Partially visualized right total hip arthroplasty, the visualized portions of which appear well-positioned. Stable moderate degenerative changes in the visualized thoracolumbar spine and severe osteoarthritis in the weight-bearing portion of the left hip joint. Prominent vascular calcifications in the abdominal aorta and  branch vessels.  IMPRESSION: 1. Nonobstructive bowel gas pattern. 2. Cardiomegaly and small left pleural effusion.   Electronically Signed   By: Delbert Phenix M.D.   On: 10/17/2014 17:28    EKG:   Orders placed or performed during the hospital encounter of 10/17/14  . ED EKG  . ED EKG  . EKG 12-Lead  . EKG 12-Lead    ASSESSMENT AND PLAN:   Pennye Beeghly is a 81 y.o. female with a known history of hypertension, insulin-dependent diabetes mellitus, coronary artery disease status post bypass graft surgery, paroxysmal atrial fibrillation on eliquis, history of recurrent ESBL Escherichia coli UTI, was recently treated with Invanz up until 3 days ago using a PICC line at home, presents to the hospital secondary to worsening cough and difficulty breathing and also chest pain. On the floor- became hypotensive and hypoxic and moved to ICU yesterday afternoon.  #1 Hypotension- due to anemia and likely sepsis from pneumonia - Improved BP with Fluid bolus, avoid further boluses if can due to her CHF - Not on pressors -Awaiting cultures and continue antibiotics  #2 Community acquired Pneumonia- blood cultures ordered - on meropenem (due to known ESBL E.coli in urine) and azithromycin - neb treatments, o2 support- on chronic home o2 of 2L at nights  #3 ARF on CKD stage3- baseline cr around 1.7, now at 2.2 - hold torsemide for now - received IV fluids yesterday - likely ATN from hypotension yesterday. Monitor - avoid nephrotoxins  #4 Recurrent ESBL E.coli- has a picc line, it can be used - Just finished Invanz on 10/11/14 -  Repeat UA and urine cultures ordered - if positive for ESBL again, will need urology f/u for cystoscopy- follows with Dr. Sampson Goon and also [facility]  #5 CAD with elevated troponin-No chest pain now - Elevated troponin in light of sepsis, hypoxia - likely demand ischemia, - no EKG changes acutely, monitor - cont cardiac meds, brady - Monitor on  tele - cards consulted- patient follows with Dr. Kirke Corin  #6 Paroxysmal Afib- rate controlled,  - bradycardic and hypotensive- hold coreg, amiodarone - hold eliquis anticoagulation due to anemia  #7 Anemia- acute on chronic - baseline seems to be around 8, yesterday was 7.7-? Dilutional - no active bleeding or melena or hematemesis - received 1 unit yesterday and improved Hb - anemia studies and stool for occult blood ordered  #8 DM- cont home meds  #9 DVT Prophylaxis- TEDS and SCDs  Wheelchair bound at baseline. Physical Therapy consulted  All the records are reviewed and case discussed with Care Management/Social Workerr. Management plans discussed with the patient, family and they are in agreement.  CODE STATUS: Full code  TOTAL CRITICAL CARE TIME SPENT IN TAKING CARE OF THIS PATIENT: 38 minutes.   POSSIBLE D/C IN 2 DAYS, DEPENDING ON CLINICAL CONDITION.   Kyna Blahnik M.D on 10/18/2014 at 10:00 AM  Between 7am to 6pm - Pager - [phone]  After 6pm go to [email or web address] - password [email or web address] [facility]  [place]  [facility]  Office  [phone]  CC: Primary care physician; Fidel Levy, MD

## 2014-10-18 NOTE — Clinical Documentation Improvement (Signed)
Internal Medicine  #1 of 2:  Abnormal Lab/Test Results:   Component      RBC Hemoglobin HCT  Latest Ref Rng      3.80 - 5.20 MIL/uL 12.0 - 16.0 g/dL 16.1 - 09.6 %  0/45/4098     6:32 AM 4.17 9.8 (L) 32.9 (L)  10/17/2014     2:19 PM 3.35 (L) 7.7 (L) 26.1 (L)  10/18/2014      3.80 8.8 (L) 29.0 (L)   Possible Clinical Conditions associated with below indicators  Anemia (if present, please specify acuity and type)  Other Condition  Cannot Clinically Determine  Treatment Provided: PRBC transfusion ordered  #2 of 2:  "history of chf" currently documented in chart.  Medical History grid notes chronic systolic CHF.  Please identify the acuity (chronic, acute, acute on chronic) and type (systolic, diastolic, combined systolic and diastolic) of chf present this admission and document in your progress notes and carry over to the discharge summary.  Please exercise your independent, professional judgment when responding. A specific answer is not anticipated or expected.  Thank you, Doy Mince, RN [phone] Clinical Documentation Specialist

## 2014-10-19 ENCOUNTER — Inpatient Hospital Stay: Payer: Medicare Other

## 2014-10-19 DIAGNOSIS — R7989 Other specified abnormal findings of blood chemistry: Secondary | ICD-10-CM

## 2014-10-19 LAB — CBC
HCT: 28.5 % — ABNORMAL LOW (ref 35.0–47.0)
HEMOGLOBIN: 8.8 g/dL — AB (ref 12.0–16.0)
MCH: 23.7 pg — AB (ref 26.0–34.0)
MCHC: 30.8 g/dL — ABNORMAL LOW (ref 32.0–36.0)
MCV: 76.9 fL — ABNORMAL LOW (ref 80.0–100.0)
PLATELETS: 197 10*3/uL (ref 150–440)
RBC: 3.71 MIL/uL — AB (ref 3.80–5.20)
RDW: 20.7 % — ABNORMAL HIGH (ref 11.5–14.5)
WBC: 4.6 10*3/uL (ref 3.6–11.0)

## 2014-10-19 LAB — GLUCOSE, CAPILLARY
GLUCOSE-CAPILLARY: 117 mg/dL — AB (ref 65–99)
GLUCOSE-CAPILLARY: 187 mg/dL — AB (ref 65–99)
GLUCOSE-CAPILLARY: 203 mg/dL — AB (ref 65–99)
Glucose-Capillary: 189 mg/dL — ABNORMAL HIGH (ref 65–99)

## 2014-10-19 LAB — BASIC METABOLIC PANEL
ANION GAP: 8 (ref 5–15)
BUN: 35 mg/dL — AB (ref 6–20)
CHLORIDE: 103 mmol/L (ref 101–111)
CO2: 29 mmol/L (ref 22–32)
Calcium: 7.9 mg/dL — ABNORMAL LOW (ref 8.9–10.3)
Creatinine, Ser: 1.98 mg/dL — ABNORMAL HIGH (ref 0.44–1.00)
GFR calc Af Amer: 26 mL/min — ABNORMAL LOW (ref >60)
GFR, EST NON AFRICAN AMERICAN: 23 mL/min — AB (ref >60)
Glucose, Bld: 99 mg/dL (ref 65–99)
POTASSIUM: 4.1 mmol/L (ref 3.5–5.1)
SODIUM: 140 mmol/L (ref 135–145)

## 2014-10-19 MED ORDER — BOOST / RESOURCE BREEZE PO LIQD
1.0000 | Freq: Three times a day (TID) | ORAL | Status: DC
  Administered 2014-10-19 – 2014-10-24 (×10): 1 via ORAL

## 2014-10-19 MED ORDER — TORSEMIDE 20 MG PO TABS
20.0000 mg | ORAL_TABLET | Freq: Every day | ORAL | Status: DC
  Administered 2014-10-19: 20 mg via ORAL
  Filled 2014-10-19: qty 1

## 2014-10-19 NOTE — Progress Notes (Signed)
SUBJECTIVE:  She is feeling better today with less cough. She is maintaining in sinus rhythm with amiodarone.  Filed Vitals:   10/19/14 0600 10/19/14 0610 10/19/14 0700 10/19/14 0900  BP: 116/60  120/80 134/65  Pulse: 62  67 71  Temp:    97.4 F (36.3 C)  TempSrc:    Oral  Resp: 13  13 16   Height:      Weight:      SpO2: 89% 97% 91% 97%    Intake/Output Summary (Last 24 hours) at 10/19/14 1020 Last data filed at 10/19/14 0700  Gross per 24 hour  Intake    520 ml  Output    350 ml  Net    170 ml    LABS: Basic Metabolic Panel:  Recent Labs  16/10/96 0358 10/19/14 0419  NA 139 140  K 4.7 4.1  CL 103 103  CO2 29 29  GLUCOSE 95 99  BUN 37* 35*  CREATININE 2.22* 1.98*  CALCIUM 7.9* 7.9*   Liver Function Tests:  Recent Labs  10/17/14 0632  AST 91*  ALT 26  ALKPHOS 101  BILITOT 1.2  PROT 7.1  ALBUMIN 3.8   No results for input(s): LIPASE, AMYLASE in the last 72 hours. CBC:  Recent Labs  10/17/14 0632  10/18/14 0358 10/19/14 0419  WBC 7.2  < > 4.6 4.6  NEUTROABS 4.4  --   --   --   HGB 9.8*  < > 8.8* 8.8*  HCT 32.9*  < > 29.0* 28.5*  MCV 78.8*  < > 76.4* 76.9*  PLT 300  < > 224 197  < > = values in this interval not displayed. Cardiac Enzymes:  Recent Labs  10/17/14 1125 10/17/14 1419 10/17/14 2117 10/18/14 1037  CKMB 2.1 2.1 2.6  --   TROPONINI 0.14* 0.18* 0.27* 0.24*   BNP: Invalid input(s): POCBNP D-Dimer: No results for input(s): DDIMER in the last 72 hours. Hemoglobin A1C: No results for input(s): HGBA1C in the last 72 hours. Fasting Lipid Panel: No results for input(s): CHOL, HDL, LDLCALC, TRIG, CHOLHDL, LDLDIRECT in the last 72 hours. Thyroid Function Tests: No results for input(s): TSH, T4TOTAL, T3FREE, THYROIDAB in the last 72 hours.  Invalid input(s): FREET3 Anemia Panel:  Recent Labs  10/18/14 1037  VITAMINB12 523  FOLATE 11.4  FERRITIN 24  TIBC 367  IRON 44  RETICCTPCT 2.2     PHYSICAL EXAM General: Well  developed, well nourished, in no acute distress HEENT:  Normocephalic and atramatic Neck:  No JVD.  Lungs: Clear bilaterally to auscultation and percussion. Heart: HRRR . Normal S1 and S2 .  Abdomen: Bowel sounds are positive, abdomen soft and non-tender  Msk:  Back normal, normal gait. Normal strength and tone for age. Extremities: No clubbing, cyanosis. trace edema.   Neuro: Alert and oriented X 3. Psych:  Good affect, responds appropriately  TELEMETRY: Reviewed telemetry pt in  normal sinus rhythm  ASSESSMENT AND PLAN:   81 y.o. female with h/o CAD s/p CABG in 1988, persistent atrial fibrillation on Eliquis, ischemic cardiomyopathy, COPD, PAD/PVD, DM2, HTN, and HLD who has been recently admitted to [facility] in mid June, mid July for ESBL UTI, HCAP, and acute on chronic systolic CHF, and mid August for Ileus presented to [facility] again on 8/30 with increased SOB and was found to have a left base pneumonia, consistent with HCAP.   1. Elevated troponin/CAD s/p CABG: -Likely supply demand ischemia in the setting of HCAP, sepsis, acute on  CKD stage III, anemia, and possible acute on chronic systolic CHF -No further ischemic evaluation at this time given the above recent cardiac catheterization  -No angina  - Cardiac catheterization this year showed no change in coronary anatomy.  2. HCAP with sepsis: -4th admission since June for either ESBL UTI or PNA -She is no longer hypotensive. Consider ID consult due to recurrent infections.   3.Persistent atrial fibrillation:  Maintaining in sinus rhythm on amiodarone. - Resume  Eliquis 2.5 mg bid once anemia situation improves and no active source of bleeding.. -Would continue amiodarone 200 mg daily as she is at significantly increased risk of going into Afib with RVR given her PNA -CHADSVASc at least 7 giving her an estimated annual stroke risk of 9.6%    Lorine Bears, MD, [facility] 10/19/2014 10:20 AM

## 2014-10-19 NOTE — Clinical Documentation Improvement (Signed)
Internal Medicine  Please clarify if the following diagnosis, sepsis was:   Present at the time of admission (POA)  NOT present at the time of admission and it developed during the inpatient stay  Unable to clinically determine whether the condition was present on admission.  Unknown   Supporting Information: Pt admitted 8/30 08:10.  Lactic Acid 3.0 10/17/14 2:19pm.  Sepsis first noted in chart 8/31.     Please exercise your independent, professional judgment when responding. A specific answer is not anticipated or expected.   Thank you, Doy Mince, RN [phone] Clinical Documentation Specialist

## 2014-10-19 NOTE — Progress Notes (Signed)
A & O. High fall risk. Takes meds ok. FS stable. 2 L of oxygen. Iso for ESBL. Pt has no further concerns at this time.

## 2014-10-19 NOTE — Care Management Note (Signed)
Case Management Note  Patient Details  Name: [name] MRN: [id number] Date of Birth: January 09, 1932  Subjective/Objective:  Recent admit at [facility] for Ileus 08/12-08/13. Readmit now for PNA.  Discharged home on  IV Invanz followed by [facility] for nursing and PT. Debbie with [facility] updated on admission. Will follow progression.                 Action/Plan:   Expected Discharge Date:                  Expected Discharge Plan:  Home w Home Health Services  In-House Referral:     Discharge planning Services  CM Consult  Post Acute Care Choice:    Choice offered to:     DME Arranged:    DME Agency:     HH Arranged:    HH Agency:  [facility]  Status of Service:  In process, will continue to follow  Medicare Important Message Given:    Date Medicare IM Given:    Medicare IM give by:    Date Additional Medicare IM Given:    Additional Medicare Important Message give by:     If discussed at Long Length of Stay Meetings, dates discussed:    Additional Comments:  Marily Memos, RN 10/19/2014, 10:43 AM

## 2014-10-19 NOTE — Progress Notes (Signed)
Patient with order to transfer to 2A, room assignment 257.  Patient and family updated.  Belonging's packed to be transfer with patient. Report given to receiving RN, Leslie Dales with no further questions.

## 2014-10-19 NOTE — Progress Notes (Signed)
[facility] [place] at [facility]   PATIENT NAME: [name]    MR#:  [id number]  DATE OF BIRTH:  24-Aug-1931  SUBJECTIVE:  CHIEF COMPLAINT:   Chief Complaint  Patient presents with  . Shortness of Breath   - Feels much better. Occasional feeling of sick on stomach. -Vitals are more stable. -Cultures are still pending.  REVIEW OF SYSTEMS:  Review of Systems  Constitutional: Negative for fever and chills.  HENT: Negative for ear discharge, hearing loss and tinnitus.   Eyes: Negative for blurred vision and double vision.  Respiratory: Positive for cough and shortness of breath. Negative for wheezing.   Cardiovascular: Negative for chest pain and palpitations.  Gastrointestinal: Positive for heartburn, nausea and abdominal pain. Negative for vomiting, diarrhea and constipation.  Genitourinary: Negative for dysuria.  Neurological: Negative for dizziness, tingling, sensory change, speech change, seizures and headaches.  Psychiatric/Behavioral: Negative for depression. The patient is not nervous/anxious.     DRUG ALLERGIES:   Allergies  Allergen Reactions  . Contrast Media [Iodinated Diagnostic Agents] Shortness Of Breath  . Tramadol Nausea Only  . Valium [Diazepam] Other (See Comments)    Reaction:  Elevated heart rate  . Iodine Strong [Iodine] Rash  . Penicillins Rash    VITALS:  Blood pressure 120/80, pulse 67, temperature 97.6 F (36.4 C), temperature source Oral, resp. rate 13, height 5\' 1"  (1.549 m), weight 65.772 kg (145 lb), SpO2 91 %.  PHYSICAL EXAMINATION:  Physical Exam  GENERAL: 81 y.o.-year-old patient lying in the bed with no acute distress. Pale appearing. EYES: Pupils equal, round, reactive to light and accommodation. Post surgical pupils. No scleral icterus. Extraocular muscles intact.  HEENT: Head atraumatic, normocephalic. Oropharynx and nasopharynx clear.  NECK: Supple, no jugular venous distention. No thyroid  enlargement, no tenderness.  LUNGS: Normal breath sounds bilaterally, no wheezing, rales,rhonchi or crepitation. No use of accessory muscles of respiration. Fine left basilar crackles noted. CARDIOVASCULAR: S1, S2 normal. No rubs, or gallops. 3/6 systolic murmur heard ABDOMEN: Soft, nontender, nondistended. Bowel sounds present. No organomegaly or mass.  EXTREMITIES: No cyanosis, or clubbing. 2+ pedal edema of right foot and left leg is more swollen in general- which is chronic since her veins were removed for CABG NEUROLOGIC: Cranial nerves II through XII are intact. Muscle strength 5/5 in all extremities. Sensation intact. Gait not checked. Generalized weakness noted. PSYCHIATRIC: The patient is alert and oriented x 3.  SKIN: No obvious rash, lesion, or ulcer.   LABORATORY PANEL:   CBC  Recent Labs Lab 10/19/14 0419  WBC 4.6  HGB 8.8*  HCT 28.5*  PLT 197   ------------------------------------------------------------------------------------------------------------------  Chemistries   Recent Labs Lab 10/17/14 0632  10/19/14 0419  NA 141  < > 140  K 4.5  < > 4.1  CL 100*  < > 103  CO2 27  < > 29  GLUCOSE 181*  < > 99  BUN 34*  < > 35*  CREATININE 1.95*  < > 1.98*  CALCIUM 9.0  < > 7.9*  AST 91*  --   --   ALT 26  --   --   ALKPHOS 101  --   --   BILITOT 1.2  --   --   < > = values in this interval not displayed. ------------------------------------------------------------------------------------------------------------------  Cardiac Enzymes  Recent Labs Lab 10/18/14 1037  TROPONINI 0.24*   ------------------------------------------------------------------------------------------------------------------  RADIOLOGY:  Dg Chest Port 1 View  10/18/2014   CLINICAL DATA:  Evaluate  pneumonia  EXAM: PORTABLE CHEST - 1 VIEW  COMPARISON:  Yesterday  FINDINGS: Unchanged cardiopericardial enlargement. Stable mediastinal contours with distortion from leftward rotation.   Right upper extremity PICC, tip stable at the SVC level.  Unchanged diffuse interstitial coarsening and dense retrocardiac opacity with air bronchograms and hazy appearance superiorly.  Remote left humeral neck fracture.  IMPRESSION: 1. Unchanged retrocardiac pneumonia or atelectasis with effusion. 2. Stable cardiomegaly and mild pulmonary edema.   Electronically Signed   By: Marnee Spring M.D.   On: 10/18/2014 08:49   Dg Abd Portable 1v  10/17/2014   CLINICAL DATA:  Nausea and vomiting.  EXAM: PORTABLE ABDOMEN - 1 VIEW  COMPARISON:  09/30/2014 abdominal radiographs.  FINDINGS: Portable supine abdominal radiographs demonstrate no disproportionately dilated small bowel loops to suggest a small bowel obstruction. No evidence of pneumatosis or pneumoperitoneum. Cholecystectomy clips are seen in the right upper quadrant. Visualized median sternotomy wires are aligned and intact. Stable cardiomegaly and likely small left pleural effusion. Partially visualized right total hip arthroplasty, the visualized portions of which appear well-positioned. Stable moderate degenerative changes in the visualized thoracolumbar spine and severe osteoarthritis in the weight-bearing portion of the left hip joint. Prominent vascular calcifications in the abdominal aorta and branch vessels.  IMPRESSION: 1. Nonobstructive bowel gas pattern. 2. Cardiomegaly and small left pleural effusion.   Electronically Signed   By: Delbert Phenix M.D.   On: 10/17/2014 17:28    EKG:   Orders placed or performed during the hospital encounter of 10/17/14  . ED EKG  . ED EKG  . EKG 12-Lead  . EKG 12-Lead    ASSESSMENT AND PLAN:   [name] is a 81 y.o. female with a known history of hypertension, insulin-dependent diabetes mellitus, coronary artery disease status post bypass graft surgery, paroxysmal atrial fibrillation on eliquis, history of recurrent ESBL Escherichia coli UTI, was recently treated with Invanz up until 3 days ago  using a PICC line at home, presents to the hospital secondary to worsening cough and difficulty breathing and also chest pain. On the floor- became hypotensive and hypoxic and moved to ICU on admission.  #1 Community acquired Pneumonia- blood cultures negative so far - on meropenem (due to known ESBL E.coli in urine) and azithromycin for 5 days - neb treatments, o2 support- on chronic home o2 of 2L at nights  #2 ARF on CKD stage3- baseline cr around 1.7, now at 2.2 -Restarted low-dose torsemide today - likely ATN from hypotension on admission. Monitor - avoid nephrotoxins  #3 chronic systolic congestive heart failure-was hypotensive on admission, so diuretics held. -Fine crackles on exam today -Restarted low-dose torsemide.  #4 Recurrent ESBL E.coli- has a picc line - Just finished Invanz on 10/11/14 - Repeat UA and urine cultures ordered-cultures are pending at this time - if positive for ESBL again, will need urology f/u for cystoscopy- follows with Dr. Sampson Goon and also [place] [facility] -Consult infectious diseases  #5 CAD with elevated troponin-No chest pain now - Elevated troponin in light of sepsis, hypoxia - likely demand ischemia, - no EKG changes acutely, monitor - cont cardiac meds, brady - Monitor on tele - cards consulted- patient follows with Dr. Kirke Corin  #6 Paroxysmal Afib- rate controlled,  - bradycardic- hold coreg - Continue amiodarone - hold eliquis anticoagulation due to anemia for now -If hemoglobin is stable and greater than 8 tomorrow, patient's eliquis can be restarted at her home dose.  #7 Anemia- acute on chronic - baseline seems to be  greater than 8, dropped to 7.7-with hypotension - received 1 unit and improved Hb -- no active bleeding or melena or hematemesis -Eliquis health for now. - anemia studies and stool for occult blood ordered  #8 DM- cont home meds  #9 DVT Prophylaxis- TEDS and SCDs  Wheelchair bound at baseline.  Physical Therapy consulted  All the records are reviewed and case discussed with Care Management/Social Workerr. Management plans discussed with the patient, family and they are in agreement.  CODE STATUS: Full code  TOTAL  TIME SPENT IN TAKING CARE OF THIS PATIENT: 38 minutes.  Transfer to telemetry floor today  POSSIBLE D/C tomorrow, DEPENDING ON CLINICAL CONDITION.   Enid Baas M.D on 10/19/2014 at 8:03 AM  Between 7am to 6pm - Pager - [phone]  After 6pm go to [email or web address] - password [email or web address] [facility]  [facility]  Office  [phone]  CC: Primary care physician; Fidel Levy, MD

## 2014-10-19 NOTE — Consult Note (Signed)
[facility] Infectious Disease     Reason for Consult:Sepsis, ESBL UTI    Referring Physician: Nemiah Commander  Date of Admission:  10/17/2014   Active Problems:   Pneumonia   HPI: [name] is a 81 y.o. female with a known history of hypertension, insulin-dependent diabetes mellitus, coronary artery disease status post bypass graft surgery, paroxysmal atrial fibrillation on eliquis, history of recurrent ESBL Escherichia coli UTI, recently treated with Invanz up until 3 days ago using a PICC line at home, admitted with worsening cough and difficulty breathing and also chest pain, as well as chills and chest pain on coughing for 2 days now. Associated with nausea, vomiting and some diarrhea episodes. On admission WBC 7.2, UA with TNTC WBC CXR on admission showed LLL infiltrate.  Had been in ICU and treated with IV abx. Currently transferred to floor. Still with cough, some sob. Reports dysuria. She finished ertapenem several days ago.  Past Medical History  Diagnosis Date  . DM2 (diabetes mellitus, type 2)     onset age 22 insulin dependent  . Hyperlipidemia   . COPD (chronic obstructive pulmonary disease)   . Peripheral vascular disease   . CAD (coronary artery disease)     a. MI 1988 s/p CABG in 1988, multiple PCI on SVGs; b. cath 2012: occluded native coronary arteries, patent LIMA to LAD (distal LAD dz), patent SVG-OM & occluded SVG-RCA which filled via left to right collats; c. cath 12/2013 patent LIMA-LAD & SVG-O. known occluded VG-RCA w/ L-R collats, no change since cath 2012  . Chronic systolic CHF (congestive heart failure)     a. 03/2012: EF 40-45%, mild lateral wall HK, mod inf HK, mod post wall HK, mild LVH, mild MR/TR   . Paroxysmal atrial fibrillation     a. CHADSVASc 7: yearly risk of CVA 9.6%;. b. on Eliquis 2.5 mg bid (age, SCr, borderline wt 62.9 Kg)   . HTN (hypertension)   . Yeast infection   . UTI (urinary tract infection)     Recurrent ESBL E.coli UTI  .  Atherosclerosis of native artery of left lower extremity   . Myocardial infarction   . CKD (chronic kidney disease) stage 3, GFR 30-59 ml/min   . Hypoxia     on nocturnal o2   Past Surgical History  Procedure Laterality Date  . Abdominal hysterectomy    . Coronary artery bypass graft    . Appendectomy    . Cholecystectomy    . Ovary surgery    . Angioplasty / stenting femoral      Bilateral SFA stenting  . Femur surgery    . Cardiac catheterization      mc  . Cardiac catheterization  12/2013  . Right hip replacement    . Total knee arthroplasty      right knee   Social History  Substance Use Topics  . Smoking status: Former Smoker -- 15 years    Types: Cigarettes    Quit date: 02/18/1988  . Smokeless tobacco: Never Used  . Alcohol Use: No   Family History  Problem Relation Age of Onset  . Diabetes Mother   . Heart disease Mother   . Hypertension Mother   . Heart disease Father   . Heart disease Brother   . CVA Father     Allergies:  Allergies  Allergen Reactions  . Contrast Media [Iodinated Diagnostic Agents] Shortness Of Breath  . Tramadol Nausea Only  . Valium [Diazepam] Other (See Comments)  Reaction:  Elevated heart rate  . Iodine Strong [Iodine] Rash  . Penicillins Rash    Current antibiotics: Antibiotics Given (last 72 hours)    Date/Time Action Medication Dose Rate   10/17/14 1040 Given   azithromycin (ZITHROMAX) tablet 500 mg 500 mg    10/17/14 1041 Given   cefTRIAXone (ROCEPHIN) 1 g in dextrose 5 % 50 mL IVPB 1 g 100 mL/hr   10/17/14 1622 Given   meropenem (MERREM) 500 mg in sodium chloride 0.9 % 50 mL IVPB 500 mg 100 mL/hr   10/17/14 2251 Given   meropenem (MERREM) 500 mg in sodium chloride 0.9 % 50 mL IVPB 500 mg 100 mL/hr   10/18/14 0918 Given   azithromycin (ZITHROMAX) tablet 500 mg 500 mg    10/18/14 0918 Given   meropenem (MERREM) 500 mg in sodium chloride 0.9 % 50 mL IVPB 500 mg 100 mL/hr   10/18/14 2106 Given   meropenem (MERREM)  500 mg in sodium chloride 0.9 % 50 mL IVPB 500 mg 100 mL/hr   10/19/14 0901 Given   meropenem (MERREM) 500 mg in sodium chloride 0.9 % 50 mL IVPB 500 mg 100 mL/hr   10/19/14 0902 Given   azithromycin (ZITHROMAX) tablet 500 mg 500 mg       MEDICATIONS: . amiodarone  200 mg Oral Daily  . azithromycin  500 mg Oral Daily  . feeding supplement  1 Container Oral TID BM  . gabapentin  400 mg Oral TID  . insulin aspart  0-5 Units Subcutaneous QHS  . insulin aspart  0-9 Units Subcutaneous TID WC  . levothyroxine  100 mcg Oral QAC breakfast  . meropenem (MERREM) IV  500 mg Intravenous Q12H  . nystatin cream  1 application Topical BID  . pantoprazole (PROTONIX) IV  40 mg Intravenous Q12H  . pravastatin  40 mg Oral Daily  . sodium chloride  3 mL Intravenous Q12H  . torsemide  20 mg Oral Daily    Review of Systems - 11 systems reviewed and negative per HPI   OBJECTIVE: Temp:  [97.4 F (36.3 C)-98 F (36.7 C)] 97.6 F (36.4 C) (09/01 2031) Pulse Rate:  [57-72] 70 (09/01 2031) Resp:  [13-20] 20 (09/01 2031) BP: (103-142)/(48-80) 127/60 mmHg (09/01 2031) SpO2:  [89 %-100 %] 94 % (09/01 2031) Weight:  [70.897 kg (156 lb 4.8 oz)] 70.897 kg (156 lb 4.8 oz) (09/01 1213) Physical Exam  Constitutional:  oriented to person, place, and time. Frail HENT: [place]/AT, PERRLA, no scleral icterus Mouth/Throat: Oropharynx is clear and moist. No oropharyngeal exudate.  Cardiovascular: Normal rate, regular rhythm and normal heart sounds.  Pulmonary/Chest: rhonchi bil Neck = supple, no nuchal rigidity Abdominal: Soft. Bowel sounds are normal.  exhibits no distension. There is no tenderness.  Lymphadenopathy: no cervical adenopathy. No axillary adenopathy Neurological: alert and oriented to person, place, and time.  Skin: Skin is warm and dry. No rash noted. No erythema.  Psychiatric: a normal mood and affect.  behavior is normal.  PICC RUE  LABS: Results for orders placed or performed during the  hospital encounter of 10/17/14 (from the past 48 hour(s))  Troponin I (q 6hr x 3)     Status: Abnormal   Collection Time: 10/17/14  9:17 PM  Result Value Ref Range   Troponin I 0.27 (H) <0.031 ng/mL    Comment: RESULTS PREVIOUSLY CALLED BY  QSD AT 6712 10/17/14 RW        PERSISTENTLY INCREASED TROPONIN VALUES IN THE RANGE OF  0.04-0.49 ng/mL CAN BE SEEN IN:       -UNSTABLE ANGINA       -CONGESTIVE HEART FAILURE       -MYOCARDITIS       -CHEST TRAUMA       -ARRYHTHMIAS       -LATE PRESENTING MYOCARDIAL INFARCTION       -COPD   CLINICAL FOLLOW-UP RECOMMENDED.   [facility] only)     Status: None   Collection Time: 10/17/14  9:17 PM  Result Value Ref Range   CK, MB 2.6 0.5 - 5.0 ng/mL  Glucose, capillary     Status: Abnormal   Collection Time: 10/17/14 10:52 PM  Result Value Ref Range   Glucose-Capillary 105 (H) 65 - 99 mg/dL  Basic metabolic panel     Status: Abnormal   Collection Time: 10/18/14  3:58 AM  Result Value Ref Range   Sodium 139 135 - 145 mmol/L   Potassium 4.7 3.5 - 5.1 mmol/L   Chloride 103 101 - 111 mmol/L   CO2 29 22 - 32 mmol/L   Glucose, Bld 95 65 - 99 mg/dL   BUN 37 (H) 6 - 20 mg/dL   Creatinine, Ser 7.47 (H) 0.44 - 1.00 mg/dL   Calcium 7.9 (L) 8.9 - 10.3 mg/dL   GFR calc non Af Amer 20 (L) >60 mL/min   GFR calc Af Amer 23 (L) >60 mL/min    Comment: (NOTE) The eGFR has been calculated using the CKD EPI equation. This calculation has not been validated in all clinical situations. eGFR's persistently <60 mL/min signify possible Chronic Kidney Disease.    Anion gap 7 5 - 15  CBC     Status: Abnormal   Collection Time: 10/18/14  3:58 AM  Result Value Ref Range   WBC 4.6 3.6 - 11.0 K/uL   RBC 3.80 3.80 - 5.20 MIL/uL   Hemoglobin 8.8 (L) 12.0 - 16.0 g/dL   HCT 51.8 (L) 00.4 - 46.0 %   MCV 76.4 (L) 80.0 - 100.0 fL   MCH 23.1 (L) 26.0 - 34.0 pg   MCHC 30.2 (L) 32.0 - 36.0 g/dL   RDW 59.4 (H) 23.9 - 33.1 %   Platelets 224 150 - 440 K/uL  Glucose,  capillary     Status: None   Collection Time: 10/18/14  8:23 AM  Result Value Ref Range   Glucose-Capillary 73 65 - 99 mg/dL  Troponin I     Status: Abnormal   Collection Time: 10/18/14 10:37 AM  Result Value Ref Range   Troponin I 0.24 (H) <0.031 ng/mL    Comment: READ BACK AND VERIFIED WITH BRITTANY KILLINGSWORTH AT 1245 10/18/14 DAS        PERSISTENTLY INCREASED TROPONIN VALUES IN THE RANGE OF 0.04-0.49 ng/mL CAN BE SEEN IN:       -UNSTABLE ANGINA       -CONGESTIVE HEART FAILURE       -MYOCARDITIS       -CHEST TRAUMA       -ARRYHTHMIAS       -LATE PRESENTING MYOCARDIAL INFARCTION       -COPD   CLINICAL FOLLOW-UP RECOMMENDED.   Vitamin B12     Status: None   Collection Time: 10/18/14 10:37 AM  Result Value Ref Range   Vitamin B-12 523 180 - 914 pg/mL    Comment: (NOTE) This assay is not validated for testing neonatal or myeloproliferative syndrome specimens for Vitamin B12 levels. Performed at [facility]  Folate     Status: None   Collection Time: 10/18/14 10:37 AM  Result Value Ref Range   Folate 11.4 >5.9 ng/mL  Iron and TIBC     Status: None   Collection Time: 10/18/14 10:37 AM  Result Value Ref Range   Iron 44 28 - 170 ug/dL   TIBC 367 250 - 450 ug/dL   Saturation Ratios 12 10.4 - 31.8 %   UIBC 323 ug/dL  Ferritin     Status: None   Collection Time: 10/18/14 10:37 AM  Result Value Ref Range   Ferritin 24 11 - 307 ng/mL  Reticulocytes     Status: Abnormal   Collection Time: 10/18/14 10:37 AM  Result Value Ref Range   Retic Ct Pct 2.2 0.4 - 3.1 %   RBC. 3.77 (L) 3.80 - 5.20 MIL/uL   Retic Count, Manual 82.9 19.0 - 183.0 K/uL  Glucose, capillary     Status: None   Collection Time: 10/18/14 11:28 AM  Result Value Ref Range   Glucose-Capillary 99 65 - 99 mg/dL  Occult blood card to lab, stool RN will collect     Status: None   Collection Time: 10/18/14  4:18 PM  Result Value Ref Range   Fecal Occult Bld NEGATIVE NEGATIVE  Glucose, capillary      Status: Abnormal   Collection Time: 10/18/14  5:13 PM  Result Value Ref Range   Glucose-Capillary 116 (H) 65 - 99 mg/dL  Glucose, capillary     Status: Abnormal   Collection Time: 10/18/14  9:01 PM  Result Value Ref Range   Glucose-Capillary 141 (H) 65 - 99 mg/dL  CBC     Status: Abnormal   Collection Time: 10/19/14  4:19 AM  Result Value Ref Range   WBC 4.6 3.6 - 11.0 K/uL   RBC 3.71 (L) 3.80 - 5.20 MIL/uL   Hemoglobin 8.8 (L) 12.0 - 16.0 g/dL   HCT 28.5 (L) 35.0 - 47.0 %   MCV 76.9 (L) 80.0 - 100.0 fL   MCH 23.7 (L) 26.0 - 34.0 pg   MCHC 30.8 (L) 32.0 - 36.0 g/dL   RDW 20.7 (H) 11.5 - 14.5 %   Platelets 197 150 - 440 K/uL  Basic metabolic panel     Status: Abnormal   Collection Time: 10/19/14  4:19 AM  Result Value Ref Range   Sodium 140 135 - 145 mmol/L   Potassium 4.1 3.5 - 5.1 mmol/L   Chloride 103 101 - 111 mmol/L   CO2 29 22 - 32 mmol/L   Glucose, Bld 99 65 - 99 mg/dL   BUN 35 (H) 6 - 20 mg/dL   Creatinine, Ser 1.98 (H) 0.44 - 1.00 mg/dL   Calcium 7.9 (L) 8.9 - 10.3 mg/dL   GFR calc non Af Amer 23 (L) >60 mL/min   GFR calc Af Amer 26 (L) >60 mL/min    Comment: (NOTE) The eGFR has been calculated using the CKD EPI equation. This calculation has not been validated in all clinical situations. eGFR's persistently <60 mL/min signify possible Chronic Kidney Disease.    Anion gap 8 5 - 15  Glucose, capillary     Status: Abnormal   Collection Time: 10/19/14  7:26 AM  Result Value Ref Range   Glucose-Capillary 117 (H) 65 - 99 mg/dL  Glucose, capillary     Status: Abnormal   Collection Time: 10/19/14 12:20 PM  Result Value Ref Range   Glucose-Capillary 187 (H) 65 - 99 mg/dL  Comment 1 Notify RN   Glucose, capillary     Status: Abnormal   Collection Time: 10/19/14  4:30 PM  Result Value Ref Range   Glucose-Capillary 203 (H) 65 - 99 mg/dL   Comment 1 Notify RN   Glucose, capillary     Status: Abnormal   Collection Time: 10/19/14  8:28 PM  Result Value Ref Range    Glucose-Capillary 189 (H) 65 - 99 mg/dL   No components found for: ESR, C REACTIVE PROTEIN MICRO: Recent Results (from the past 720 hour(s))  Microscopic Examination     Status: Abnormal   Collection Time: 09/22/14  2:14 PM  Result Value Ref Range Status   WBC, UA >30 (A) 0 -  5 /hpf Final   RBC, UA 0-2 0 -  2 /hpf Final   Epithelial Cells (non renal) 0-10 0 - 10 /hpf Final   Renal Epithel, UA None seen None seen /hpf Final   Casts Present (A) None seen /lpf Final   Cast Type Hyaline casts N/A Final   Bacteria, UA Many (A) None seen/Few Final  CULTURE, URINE COMPREHENSIVE     Status: Abnormal   Collection Time: 09/22/14  3:18 PM  Result Value Ref Range Status   Urine Culture, Comprehensive Final report (A)  Final   Result 1 Escherichia coli (A)  Final    Comment: Greater than 100,000 colony forming units per mL   ANTIMICROBIAL SUSCEPTIBILITY Comment  Final    Comment:       ** S = Susceptible; I = Intermediate; R = Resistant **                    P = Positive; N = Negative             MICS are expressed in micrograms per mL    Antibiotic                 RSLT#1    RSLT#2    RSLT#3    RSLT#4 Amoxicillin/Clavulanic Acid    R Ampicillin                     R Cefazolin                      R Cefepime                       R Ceftriaxone                    R Cefuroxime                     R Cephalothin                    R Ciprofloxacin                  R Ertapenem                      S Gentamicin                     S Imipenem                       S Levofloxacin                   R Nitrofurantoin  R Piperacillin                   R Tetracycline                   R Tobramycin                     R Trimethoprim/Sulfa             R   Min Inhibitory Conc (1 Drug)     Status: None   Collection Time: 09/22/14  3:18 PM  Result Value Ref Range Status   Min Inhibitory Conc (1 Drug) Final report  Final   Result 1 Escherichia coli  Final    Comment: FOSFOMYCIN    =      .75 UG/ML    = SUSCEPTIBLE  Blood culture (routine x 2)     Status: None (Preliminary result)   Collection Time: 10/17/14  9:07 AM  Result Value Ref Range Status   Specimen Description BLOOD LEFT ASSIST CONTROL  Final   Special Requests BOTTLES DRAWN AEROBIC AND ANAEROBIC  1 CC  Final   Culture NO GROWTH 2 DAYS  Final   Report Status PENDING  Incomplete  Blood culture (routine x 2)     Status: None (Preliminary result)   Collection Time: 10/17/14  9:07 AM  Result Value Ref Range Status   Specimen Description BLOOD RIGHT ASSIST CONTROL  Final   Special Requests BOTTLES DRAWN AEROBIC AND ANAEROBIC  1 CC  Final   Culture NO GROWTH 2 DAYS  Final   Report Status PENDING  Incomplete  Urine culture     Status: None (Preliminary result)   Collection Time: 10/17/14  5:51 PM  Result Value Ref Range Status   Specimen Description URINE, CLEAN CATCH  Final   Special Requests NONE  Final   Culture   Final    20,000 COLONIES/mL YEAST IDENTIFICATION TO FOLLOW    Report Status PENDING  Incomplete  MRSA PCR Screening     Status: None   Collection Time: 10/17/14  5:51 PM  Result Value Ref Range Status   MRSA by PCR NEGATIVE NEGATIVE Final    Comment:        The GeneXpert MRSA Assay (FDA approved for NASAL specimens only), is one component of a comprehensive MRSA colonization surveillance program. It is not intended to diagnose MRSA infection nor to guide or monitor treatment for MRSA infections.     IMAGING: Dg Chest 2 View  09/29/2014   CLINICAL DATA:  Substernal chest pain and abdominal pain since yesterday.  EXAM: CHEST - 2 VIEW; ABDOMEN - 2 VIEW  COMPARISON:  Chest x-ray 09/03/2014  FINDINGS: Chest x-ray:  The right PICC line is stable. Stable cardiac enlargement and tortuous ectatic and calcified thoracic aorta. There are chronic lung changes with possible superimposed interstitial edema or bronchitis. There is a persistent left lower lobe process which is likely a combination of  effusion and atelectasis or infiltrate.  Two-view abdomen:  The abdominal bowel gas pattern suggests a mild diffuse ileus or possible gastroenteritis. There are air-filled small bowel loops without distention and some scattered air and stool throughout the colon. No free air. The soft tissue shadows are grossly maintained. Extensive vascular calcifications are noted. The bony structures are grossly intact. There is a right hip prosthesis and severe degenerative changes involving the left hip.  IMPRESSION: 1. Persistent left lower lobe process likely a combination of effusion, atelectasis  and/or infiltrate. 2. Underline chronic lung changes. 3. Stable right PICC line. 4. Abdominal bowel gas pattern suggests a mild ileus or gastroenteritis. No findings for small bowel obstruction or free air.   Electronically Signed   By: Marijo Sanes M.D.   On: 09/29/2014 09:23   Dg Chest Port 1 View  10/18/2014   CLINICAL DATA:  Evaluate pneumonia  EXAM: PORTABLE CHEST - 1 VIEW  COMPARISON:  Yesterday  FINDINGS: Unchanged cardiopericardial enlargement. Stable mediastinal contours with distortion from leftward rotation.  Right upper extremity PICC, tip stable at the SVC level.  Unchanged diffuse interstitial coarsening and dense retrocardiac opacity with air bronchograms and hazy appearance superiorly.  Remote left humeral neck fracture.  IMPRESSION: 1. Unchanged retrocardiac pneumonia or atelectasis with effusion. 2. Stable cardiomegaly and mild pulmonary edema.   Electronically Signed   By: Monte Fantasia M.D.   On: 10/18/2014 08:49   Dg Chest Portable 1 View  10/17/2014   CLINICAL DATA:  Dyspnea and chest tightness.  EXAM: PORTABLE CHEST - 1 VIEW  COMPARISON:  09/29/2014  FINDINGS: There is a right upper extremity PICC line with tip in the SVC. There is moderate unchanged cardiomegaly. There is left base consolidation and effusion, unchanged. The right lung is clear.  IMPRESSION: Left base consolidation and effusion.   Cardiomegaly.   Electronically Signed   By: Andreas Newport M.D.   On: 10/17/2014 06:20   Dg Abd 2 Views  09/30/2014   CLINICAL DATA:  Abdominal pain, tightness, burping and belching with nausea  EXAM: ABDOMEN - 2 VIEW  COMPARISON:  09/29/2014  FINDINGS: The bowel gas pattern is normal. There is no evidence of free air. No radio-opaque calculi or other significant radiographic abnormality is seen.  Partially visualize is left lower lobe airspace disease and a small left pleural effusion. There is stable cardiomegaly. There is evidence of prior CABG.  Right total hip arthroplasty. Severe osteoarthritis of the left hip.  IMPRESSION: 1. No bowel obstruction. 2. Severe osteoarthritis of the left hip.   Electronically Signed   By: Kathreen Devoid   On: 09/30/2014 10:08   Dg Abd 2 Views  09/29/2014   CLINICAL DATA:  Substernal chest pain and abdominal pain since yesterday.  EXAM: CHEST - 2 VIEW; ABDOMEN - 2 VIEW  COMPARISON:  Chest x-ray 09/03/2014  FINDINGS: Chest x-ray:  The right PICC line is stable. Stable cardiac enlargement and tortuous ectatic and calcified thoracic aorta. There are chronic lung changes with possible superimposed interstitial edema or bronchitis. There is a persistent left lower lobe process which is likely a combination of effusion and atelectasis or infiltrate.  Two-view abdomen:  The abdominal bowel gas pattern suggests a mild diffuse ileus or possible gastroenteritis. There are air-filled small bowel loops without distention and some scattered air and stool throughout the colon. No free air. The soft tissue shadows are grossly maintained. Extensive vascular calcifications are noted. The bony structures are grossly intact. There is a right hip prosthesis and severe degenerative changes involving the left hip.  IMPRESSION: 1. Persistent left lower lobe process likely a combination of effusion, atelectasis and/or infiltrate. 2. Underline chronic lung changes. 3. Stable right PICC line. 4.  Abdominal bowel gas pattern suggests a mild ileus or gastroenteritis. No findings for small bowel obstruction or free air.   Electronically Signed   By: Marijo Sanes M.D.   On: 09/29/2014 09:23   Dg Abd Portable 1v  10/17/2014   CLINICAL DATA:  Nausea and vomiting.  EXAM: PORTABLE ABDOMEN - 1 VIEW  COMPARISON:  09/30/2014 abdominal radiographs.  FINDINGS: Portable supine abdominal radiographs demonstrate no disproportionately dilated small bowel loops to suggest a small bowel obstruction. No evidence of pneumatosis or pneumoperitoneum. Cholecystectomy clips are seen in the right upper quadrant. Visualized median sternotomy wires are aligned and intact. Stable cardiomegaly and likely small left pleural effusion. Partially visualized right total hip arthroplasty, the visualized portions of which appear well-positioned. Stable moderate degenerative changes in the visualized thoracolumbar spine and severe osteoarthritis in the weight-bearing portion of the left hip joint. Prominent vascular calcifications in the abdominal aorta and branch vessels.  IMPRESSION: 1. Nonobstructive bowel gas pattern. 2. Cardiomegaly and small left pleural effusion.   Electronically Signed   By: Ilona Sorrel M.D.   On: 10/17/2014 17:28    Assessment:   NEVAEHA FINERTY is a 81 y.o. female with recurrent ESBL E coli UTI now s.p 2 course of IV ertapenem admitted with cough, sob, fever and cxr evidence of PNA. Just recently finished IV ertapenem. Her UA was floridly positive with TNTC WBC and WBC clumps. However cx is only growing yeast. SHe has been followed by Dr Erlene Quan in urology.  Last renal imagine in April showed no stones, hydro.   She does not recall any history of a cystoscopy.   Recommendations Cont meropenem - can dc azithr as sp CAP treatment course x 3 days  Check post void residuals (ordered for nursing) Would repeat renal imaging- suggest CT stone protocol. May need urology consult and cystoscopy to eval  etiology of recurrent UTIs. Thank you very much for allowing me to participate in the care of this patient. Please call with questions.   Cheral Marker. Ola Spurr, MD

## 2014-10-20 ENCOUNTER — Inpatient Hospital Stay: Payer: Medicare Other

## 2014-10-20 DIAGNOSIS — J9601 Acute respiratory failure with hypoxia: Secondary | ICD-10-CM

## 2014-10-20 DIAGNOSIS — N390 Urinary tract infection, site not specified: Secondary | ICD-10-CM

## 2014-10-20 DIAGNOSIS — B9620 Unspecified Escherichia coli [E. coli] as the cause of diseases classified elsewhere: Secondary | ICD-10-CM

## 2014-10-20 DIAGNOSIS — R778 Other specified abnormalities of plasma proteins: Secondary | ICD-10-CM

## 2014-10-20 DIAGNOSIS — R7989 Other specified abnormal findings of blood chemistry: Secondary | ICD-10-CM

## 2014-10-20 LAB — CBC
HEMATOCRIT: 30.7 % — AB (ref 35.0–47.0)
Hemoglobin: 9.3 g/dL — ABNORMAL LOW (ref 12.0–16.0)
MCH: 23.5 pg — ABNORMAL LOW (ref 26.0–34.0)
MCHC: 30.2 g/dL — AB (ref 32.0–36.0)
MCV: 77.7 fL — ABNORMAL LOW (ref 80.0–100.0)
PLATELETS: 235 10*3/uL (ref 150–440)
RBC: 3.94 MIL/uL (ref 3.80–5.20)
RDW: 20.8 % — AB (ref 11.5–14.5)
WBC: 5.6 10*3/uL (ref 3.6–11.0)

## 2014-10-20 LAB — URINE CULTURE

## 2014-10-20 LAB — BASIC METABOLIC PANEL
Anion gap: 8 (ref 5–15)
BUN: 37 mg/dL — ABNORMAL HIGH (ref 6–20)
CALCIUM: 8.1 mg/dL — AB (ref 8.9–10.3)
CO2: 29 mmol/L (ref 22–32)
CREATININE: 2.07 mg/dL — AB (ref 0.44–1.00)
Chloride: 101 mmol/L (ref 101–111)
GFR, EST AFRICAN AMERICAN: 25 mL/min — AB (ref >60)
GFR, EST NON AFRICAN AMERICAN: 21 mL/min — AB (ref >60)
Glucose, Bld: 239 mg/dL — ABNORMAL HIGH (ref 65–99)
Potassium: 4.2 mmol/L (ref 3.5–5.1)
Sodium: 138 mmol/L (ref 135–145)

## 2014-10-20 LAB — GLUCOSE, CAPILLARY
GLUCOSE-CAPILLARY: 226 mg/dL — AB (ref 65–99)
GLUCOSE-CAPILLARY: 186 mg/dL — AB (ref 65–99)
Glucose-Capillary: 181 mg/dL — ABNORMAL HIGH (ref 65–99)
Glucose-Capillary: 153 mg/dL — ABNORMAL HIGH (ref 65–99)

## 2014-10-20 LAB — AMMONIA: Ammonia: <9 umol/L — ABNORMAL LOW (ref 9–35)

## 2014-10-20 MED ORDER — PANTOPRAZOLE SODIUM 40 MG PO TBEC
40.0000 mg | DELAYED_RELEASE_TABLET | Freq: Every day | ORAL | Status: DC
  Administered 2014-10-21 – 2014-10-24 (×4): 40 mg via ORAL
  Filled 2014-10-20 (×4): qty 1

## 2014-10-20 MED ORDER — INSULIN GLARGINE 100 UNIT/ML ~~LOC~~ SOLN
15.0000 [IU] | Freq: Every day | SUBCUTANEOUS | Status: DC
  Administered 2014-10-20 – 2014-10-24 (×5): 15 [IU] via SUBCUTANEOUS
  Filled 2014-10-20 (×8): qty 0.15

## 2014-10-20 MED ORDER — TORSEMIDE 20 MG PO TABS
20.0000 mg | ORAL_TABLET | Freq: Every day | ORAL | Status: DC
  Administered 2014-10-20 – 2014-10-24 (×5): 20 mg via ORAL
  Filled 2014-10-20 (×5): qty 1

## 2014-10-20 NOTE — Progress Notes (Signed)
Moving patient to room 251 d/t increased risk of impulsiveness due to confusion/hallucinations. Family was at bedside and is aware.

## 2014-10-20 NOTE — Clinical Social Work Note (Signed)
Clinical Social Work Assessment  Patient Details  Name: [name] MRN: [id number] Date of Birth: [birth year]  Date of referral:  10/20/14               Reason for consult:  Facility Placement                Permission sought to share information with:  Facility Medical sales representative, Family Supports Permission granted to share information::  No (pt not oriented enough to assess)  Name::     Jeniffer Granddaughter [phone]  Agency::     Relationship::  [facility]   Contact Information:     Housing/Transportation Living arrangements for the past 2 months:  Single Family Home Source of Information:  Other (Comment Required) (HCPOA and Granddaughter) Patient Interpreter Needed:  None Criminal Activity/Legal Involvement Pertinent to Current Situation/Hospitalization:  No - Comment as needed Significant Relationships:  Other Family Members Lives with:  Other (Comment) (granddaughter, her fiance, and 66yr old grandson) Do you feel safe going back to the place where you live?    Need for family participation in patient care:     Care giving concerns:  Granddaughter is currently concerned with pts mental status.  She stated that the pt is not normally this confused.  Pt granddaughter this is new     Office manager / plan:  CSW spoke to granddaughter about SNF placement.  Granddaughter works at [employer] and would like her to do her SNF placement at this facility.  CSW will send out FL2 to this facility and continue to follow.  Employment status:  Retired Health and safety inspector:    PT Recommendations:  Skilled Teacher, early years/pre / Referral to community resources:     Patient/Family's Response to care:  Pt's granddaughter is in agreement with DC to [employer]  Patient/Family's Understanding of and Emotional Response to Diagnosis, Current Treatment, and Prognosis:  Pt's granddaughter is in agreement with DC to [facility] and verbalized her understanding of this DC  plan.    Emotional Assessment Appearance:  Appears stated age Attitude/Demeanor/Rapport:  Unable to Assess Affect (typically observed):  Unable to Assess Orientation:    Alcohol / Substance use:  Never Used Psych involvement (Current and /or in the community):  No (Comment)  Discharge Needs  Concerns to be addressed:   (Per pt's granddaughter pt is not normally this confused/) Readmission within the last 30 days:  Yes Current discharge risk:  Cognitively Impaired, Physical Impairment Barriers to Discharge:  Continued Medical Work up   [employer], LCSW 10/20/2014, 3:50 PM

## 2014-10-20 NOTE — Progress Notes (Addendum)
Patient's telemetry monitor showing a lot of artifact. Tele box, leads and stickers switched out. [id number] now on patient, verified with telemetry clerk.

## 2014-10-20 NOTE — Progress Notes (Signed)
[facility] -  at [facility]   PATIENT NAME: [name]    MR#:  [id number]  DATE OF BIRTH:  01-02-1932  SUBJECTIVE:  CHIEF COMPLAINT:   Chief Complaint  Patient presents with  . Shortness of Breath   Admitted for shortness of breath, was noted to have pyuria and admitted for possible recurrence urinary tract infection, ESBL Escherichia coli, chest x-ray revealed left base pneumonia,  initiated on meropenem, now also on Zithromax due to community-acquired pneumonia, being followed by Dr. Sampson Goon who recommends urology follow-up for recurrent UTIs. Patient is more confused today  REVIEW OF SYSTEMS:  Review of Systems  Constitutional: Negative for fever and chills.  HENT: Negative for ear discharge, hearing loss and tinnitus.   Eyes: Negative for blurred vision and double vision.  Respiratory: Positive for cough and shortness of breath. Negative for wheezing.   Cardiovascular: Negative for chest pain and palpitations.  Gastrointestinal: Positive for heartburn, nausea and abdominal pain. Negative for vomiting, diarrhea and constipation.  Genitourinary: Negative for dysuria.  Neurological: Negative for dizziness, tingling, sensory change, speech change, seizures and headaches.  Psychiatric/Behavioral: Negative for depression. The patient is not nervous/anxious.     DRUG ALLERGIES:   Allergies  Allergen Reactions  . Contrast Media [Iodinated Diagnostic Agents] Shortness Of Breath  . Tramadol Nausea Only  . Valium [Diazepam] Other (See Comments)    Reaction:  Elevated heart rate  . Iodine Strong [Iodine] Rash  . Penicillins Rash    VITALS:  Blood pressure 123/64, pulse 64, temperature 97.8 F (36.6 C), temperature source Oral, resp. rate 14, height  (1.549 m), weight 70.897 kg (156 lb 4.8 oz), SpO2 98 %.  PHYSICAL EXAMINATION:  Physical Exam  GENERAL: 81 y.o.-year-old patient lying in the bed with no acute distress. Somnolent  but awakens easily. Admits of being confused yesterday, feels good today. EYES: Pupils equal, round, reactive to light and accommodation. Post surgical pupils. No scleral icterus. Extraocular muscles intact.  HEENT: Head atraumatic, normocephalic. Oropharynx and nasopharynx clear.  NECK: Supple, no jugular venous distention. No thyroid enlargement, no tenderness.  LUNGS: Normal breath sounds bilaterally, no wheezing, rales,rhonchi or crepitation. No use of accessory muscles of respiration. Fine left basilar crackles noted. CARDIOVASCULAR: S1, S2 normal. No rubs, or gallops. 3/6 systolic murmur heard ABDOMEN: Soft, nontender, nondistended. Bowel sounds present. No organomegaly or mass.  EXTREMITIES: No cyanosis, or clubbing. 2+ pedal edema of right foot and left leg is more swollen in general- which is chronic since her veins were removed for CABG NEUROLOGIC: Cranial nerves II through XII are intact. Muscle strength 5/5 in all extremities. Sensation intact. Gait not checked. Generalized weakness noted. PSYCHIATRIC: The patient is alert and oriented x 3.  SKIN: No obvious rash, lesion, or ulcer.   LABORATORY PANEL:   CBC  Recent Labs Lab 10/20/14 0500  WBC 5.6  HGB 9.3*  HCT 30.7*  PLT 235   ------------------------------------------------------------------------------------------------------------------  Chemistries   Recent Labs Lab 10/17/14 0632  10/20/14 0500  NA 141  < > 138  K 4.5  < > 4.2  CL 100*  < > 101  CO2 27  < > 29  GLUCOSE 181*  < > 239*  BUN 34*  < > 37*  CREATININE 1.95*  < > 2.07*  CALCIUM 9.0  < > 8.1*  AST 91*  --   --   ALT 26  --   --   ALKPHOS 101  --   --  BILITOT 1.2  --   --   < > = values in this interval not displayed. ------------------------------------------------------------------------------------------------------------------  Cardiac Enzymes  Recent Labs Lab 10/18/14 1037  TROPONINI 0.24*    ------------------------------------------------------------------------------------------------------------------  RADIOLOGY:  Ct Renal Stone Study  10/20/2014   CLINICAL DATA:  Recurrent urinary tract infections.  Dysuria.  EXAM: CT ABDOMEN AND PELVIS WITHOUT CONTRAST  TECHNIQUE: Multidetector CT imaging of the abdomen and pelvis was performed following the standard protocol without IV contrast.  COMPARISON:  06/14/2014  FINDINGS: Lower chest: Left worse than right base airspace disease. Cardiomegaly with prior median sternotomy and dense coronary artery atherosclerosis. Small bilateral pleural effusions.  Hepatobiliary: Mildly hyper attenuating liver. Cholecystectomy, without biliary ductal dilatation.  Pancreas: Moderate pancreatic atrophy.  Spleen: Normal  Adrenals/Urinary Tract: Left adrenal myelolipoma measures 1.7 cm. Normal right adrenal gland. Mild renal cortical thinning bilaterally. Favor left renal vascular calcifications on image 39 of series 2. No definite left and no right sided renal calculi. Mild nonspecific perinephric interstitial thickening, increased. No hydroureter  Degraded evaluation of the pelvis, secondary to beam hardening artifact from right hip arthroplasty. Given this factor, no bladder stones identified.  Stomach/Bowel: Normal stomach, without wall thickening. Extensive colonic diverticulosis. Muscular hypertrophy involving the sigmoid. Edema adjacent the sigmoid is resolved. Normal terminal ileum and appendix. Normal small bowel caliber.  Vascular/Lymphatic: Advanced aortic and branch vessel atherosclerosis. No abdominopelvic adenopathy.  Reproductive: Hysterectomy.  No adnexal mass.  Other: New presacral edema. New small volume abdominal ascites. Significantly increased anasarca.  Musculoskeletal: Moderate to marked left hip osteoarthritis. Right hip arthroplasty. Osteopenia. Trace L4-5 anterolisthesis.  IMPRESSION: 1. Left renal calcifications are favored to be vascular. No  convincing evidence of urinary tract calculi and no evidence of hydronephrosis. 2. Degraded evaluation of the pelvis, secondary to beam hardening artifact from right hip arthroplasty. 3. New bilateral pleural effusions, ascites, and anasarca, suggesting fluid overload. 4. Bibasilar airspace opacities. Favor atelectasis, especially on the left. Left base pneumonia cannot be excluded. 5. Hyper attenuating liver could relate to iron deposition or prior amiodarone usage. 6. Left adrenal myelolipoma. 7. Advanced atherosclerosis. 8. Progressive perirenal interstitial thickening is nonspecific and could relate to fluid overload. Pyelonephritis could look similar.   Electronically Signed   By: Jeronimo Greaves M.D.   On: 10/20/2014 09:40    EKG:   Orders placed or performed during the hospital encounter of 10/17/14  . ED EKG  . ED EKG  . EKG 12-Lead  . EKG 12-Lead  . EKG 12-Lead  . EKG 12-Lead  . EKG 12-Lead  . EKG 12-Lead    ASSESSMENT AND PLAN:   Dariana Garbett is a 81 y.o. female with a known history of hypertension, insulin-dependent diabetes mellitus, coronary artery disease status post bypass graft surgery, paroxysmal atrial fibrillation on eliquis, history of recurrent ESBL Escherichia coli UTI, was recently treated with Invanz up until 3 days ago using a PICC line at home, presents to the hospital secondary to worsening cough and difficulty breathing and also chest pain. On the floor- became hypotensive and hypoxic and moved to ICU on admission.  #1 Community acquired Pneumonia- blood cultures negative so far - on meropenem (due to known ESBL E.coli in urine) and azithromycin for 5 days - neb treatments, o2 support- on chronic home o2 of 2L at nights  #2 ARF on CKD stage3- baseline cr around 1.7, now at 2.07 -Continue torsemide due to concerns about pleural effusions, anasarca, ascites - likely ATN from hypotension on admission. Monitor - avoid  nephrotoxins.   #3 chronic systolic  congestive heart failure-was hypotensive on admission, so diuretics held. Resume the torsemide now due to pleural effusions, anasarca and ascites, may benefit from spironolactone overall. Restarted low-dose torsemide.  #4 Recurrent ESBL E.coli- has a picc line - Just finished Invanz on 10/11/14 - Repeat  urine cultures revealed only Candida, which is likely colonization. Dr. Orlinda Blalock recommends to follow up with [facility] for further recommendations   #5 CAD with elevated troponin-No chest pain now - Elevated troponin in light of sepsis, hypoxia - likely demand ischemia, - no EKG changes acutely, monitor - cont cardiac meds, brady - Monitor on tele - cards consulted- patient follows with Dr. Kirke Corin  #6 Paroxysmal Afib- rate controlled,  - bradycardic- hold coreg - Continue amiodarone - hold eliquis anticoagulation due to anemia for now -If hemoglobin is stable and greater than 8 tomorrow, patient's eliquis can be restarted at her home dose.  #7 Anemia- acute on chronic - baseline seems to be greater than 8, dropped to 7.7-with hypotension - received 1 unit and improved Hb -- no active bleeding or melena or hematemesis -Eliquis health for now. - anemia studies and stool for occult blood ordered  #8 DM- cont home meds  #9 DVT Prophylaxis- TEDS and SCDs #10. Confusion rule out hepatic encephalopathy, get ammonia level  Wheelchair bound at baseline. Physical Therapy consulted  All the records are reviewed and case discussed with Care Management/Social Workerr. Management plans discussed with the patient, family and they are in agreement.  CODE STATUS: Full code  TOTAL  TIME SPENT IN TAKING CARE OF THIS PATIENT: .    POSSIBLE D/C tomorrow, DEPENDING ON CLINICAL CONDITION.   Katharina Caper M.D on 10/20/2014 at 3:33 PM  Between 7am to 6pm - Pager - [phone]  After 6pm go to [email or web address] - password [email or web address] [facility]  [facility]  Office   [phone]  CC: Primary care physician; Fidel Levy, MD

## 2014-10-20 NOTE — Progress Notes (Signed)
ANTIBIOTIC CONSULT NOTE - FOLLOW UP  Pharmacy Consult for meropenem Indication: pneumonia, recurrent ESBL UTI  Allergies  Allergen Reactions  . Contrast Media [Iodinated Diagnostic Agents] Shortness Of Breath  . Tramadol Nausea Only  . Valium [Diazepam] Other (See Comments)    Reaction:  Elevated heart rate  . Iodine Strong [Iodine] Rash  . Penicillins Rash    Patient Measurements: Height:  (154.9 cm) Weight: 156 lb 4.8 oz (70.897 kg) IBW/kg (Calculated) : 47.8   Vital Signs: Temp: 97.8 F (36.6 C) (09/02 1120) Temp Source: Oral (09/02 1120) BP: 123/64 mmHg (09/02 1146) Pulse Rate: 64 (09/02 1146) Intake/Output from previous day: 09/01 0701 - 09/02 0700 In: 100 [IV Piggyback:100] Out: 400 [Urine:400] Intake/Output from this shift: Total I/O In: 50 [IV Piggyback:50] Out: 0    Recent Labs  10/18/14 0358 10/19/14 0419 10/20/14 0500  WBC 4.6 4.6 5.6  HGB 8.8* 8.8* 9.3*  PLT 224 197 235  CREATININE 2.22* 1.98* 2.07*   Estimated Creatinine Clearance: 19.2 mL/min (by C-G formula based on Cr of 2.07).   Microbiology: Recent Results (from the past 720 hour(s))  Microscopic Examination     Status: Abnormal   Collection Time: 09/22/14  2:14 PM  Result Value Ref Range Status   WBC, UA >30 (A) 0 -  5 /hpf Final   RBC, UA 0-2 0 -  2 /hpf Final   Epithelial Cells (non renal) 0-10 0 - 10 /hpf Final   Renal Epithel, UA None seen None seen /hpf Final   Casts Present (A) None seen /lpf Final   Cast Type Hyaline casts N/A Final   Bacteria, UA Many (A) None seen/Few Final  CULTURE, URINE COMPREHENSIVE     Status: Abnormal   Collection Time: 09/22/14  3:18 PM  Result Value Ref Range Status   Urine Culture, Comprehensive Final report (A)  Final   Result 1 Escherichia coli (A)  Final    Comment: Greater than 100,000 colony forming units per mL   ANTIMICROBIAL SUSCEPTIBILITY Comment  Final    Comment:       ** S = Susceptible; I = Intermediate; R = Resistant **               P = Positive; N = Negative             MICS are expressed in micrograms per mL    Antibiotic                 RSLT#1    RSLT#2    RSLT#3    RSLT#4 Amoxicillin/Clavulanic Acid    R Ampicillin                     R Cefazolin                      R Cefepime                       R Ceftriaxone                    R Cefuroxime                     R Cephalothin                    R Ciprofloxacin  R Ertapenem                      S Gentamicin                     S Imipenem                       S Levofloxacin                   R Nitrofurantoin                 R Piperacillin                   R Tetracycline                   R Tobramycin                     R Trimethoprim/Sulfa             R   Min Inhibitory Conc (1 Drug)     Status: None   Collection Time: 09/22/14  3:18 PM  Result Value Ref Range Status   Min Inhibitory Conc (1 Drug) Final report  Final   Result 1 Escherichia coli  Final    Comment: FOSFOMYCIN    =     .75 UG/ML    = SUSCEPTIBLE  Blood culture (routine x 2)     Status: None (Preliminary result)   Collection Time: 10/17/14  9:07 AM  Result Value Ref Range Status   Specimen Description BLOOD LEFT ASSIST CONTROL  Final   Special Requests BOTTLES DRAWN AEROBIC AND ANAEROBIC  1 CC  Final   Culture NO GROWTH 3 DAYS  Final   Report Status PENDING  Incomplete  Blood culture (routine x 2)     Status: None (Preliminary result)   Collection Time: 10/17/14  9:07 AM  Result Value Ref Range Status   Specimen Description BLOOD RIGHT ASSIST CONTROL  Final   Special Requests BOTTLES DRAWN AEROBIC AND ANAEROBIC  1 CC  Final   Culture NO GROWTH 3 DAYS  Final   Report Status PENDING  Incomplete  Urine culture     Status: None   Collection Time: 10/17/14  5:51 PM  Result Value Ref Range Status   Specimen Description URINE, CLEAN CATCH  Final   Special Requests NONE  Final   Culture 20,000 COLONIES/mL CANDIDA ALBICANS  Final   Report Status 10/20/2014  FINAL  Final  MRSA PCR Screening     Status: None   Collection Time: 10/17/14  5:51 PM  Result Value Ref Range Status   MRSA by PCR NEGATIVE NEGATIVE Final    Comment:        The GeneXpert MRSA Assay (FDA approved for NASAL specimens only), is one component of a comprehensive MRSA colonization surveillance program. It is not intended to diagnose MRSA infection nor to guide or monitor treatment for MRSA infections.     Anti-infectives    Start     Dose/Rate Route Frequency Ordered Stop   10/17/14 1530  meropenem (MERREM) 500 mg in sodium chloride 0.9 % 50 mL IVPB     500 mg 100 mL/hr over 30 Minutes Intravenous Every 12 hours 10/17/14 1433     10/17/14 1000  azithromycin (ZITHROMAX) tablet 500 mg  Status:  Discontinued     500 mg Oral Daily 10/17/14  1610 10/19/14 2120   10/17/14 0915  cefTRIAXone (ROCEPHIN) 1 g in dextrose 5 % 50 mL IVPB  Status:  Discontinued     1 g 100 mL/hr over 30 Minutes Intravenous Every 24 hours 10/17/14 0910 10/17/14 1412   10/17/14 0700  levofloxacin (LEVAQUIN) IVPB 500 mg  Status:  Discontinued     500 mg 100 mL/hr over 60 Minutes Intravenous  Once 10/17/14 0646 10/17/14 0910      Assessment: Pharmacy dosing meropenem for pneumonia and recurrent ESBL UTI.   Plan: Will continue with current dose of meropenem 500 mg IV q12h based on patients renal function (estimated CrCl = 19). Pharmacy will continue to monitor renal function and adjust dose if necessary.  Jodelle Red [place] 10/20/2014,2:41 PM

## 2014-10-20 NOTE — Consult Note (Signed)
I have been asked to see the patient by Dr. Clydie Braun, for evaluation and management of recurrent urinary tract infections.  History of present illness: 81 year old female who is currently admitted to the hospital for worsening cough, difficulty breathing and chest pain. She was subsequently diagnosed with pneumonia. The patient has a history of hypertension, diabetes, coronary artery disease, and A. fib. She has has a history of ESBL Escherichia coli urinary tract infections. Her last positive urine culture was on 09/22/14.  I have been consulted to evaluate the patient's recurrent urinary tract infections.  I interviewed the patient myself, there were no family members in the room, the patient has been noted to have some altered mental status and confusion today.  The patient states that she has had dysuria progressive voiding symptoms over the last 4 months. Prior to this, the patient states that she did not have a history of recurrent urinary tract infections. She states that she has a strong stream. She does not have to strain to void. She does feel as if she does not empty her bladder completely. She complains of urinary frequency. She complains of dysuria. She also complains of significant urinary incontinence. This is been going on for a long time. She should not complaint constipation. She was recently treated with IV antibody for her ESBL Escherichia coli infection.  The patient has a history of a hysterectomy in the 1950s. She denies any pelvic pressure or sensation of bladder prolapse. She denies any fevers or chills. She denies any flank pain or gross hematuria. She has no history of kidney stones. She has no significant GU history.  No PVRs are available in the chart, the patient has been voiding independently, currently she does not have a Foley catheter. She had a CT scan, stone protocol today, demonstrating no evidence of urinary tract calculi. She does have some left vascular  calcifications. The bladder was unremarkable, although the images are somewhat secured by beam hardening from her artificial hip.   Review of systems: A 12 point comprehensive review of systems was obtained and is negative unless otherwise stated in the history of present illness.  Patient Active Problem List   Diagnosis Date Noted  . Acute respiratory failure with hypoxia   . Elevated troponin   . Recurrent UTI   . Pneumonia 10/17/2014  . Pressure ulcer 09/30/2014  . Ileus 09/29/2014  . Chronic left hip pain 09/12/2014  . Healthcare-associated pneumonia 08/31/2014  . UTI (lower urinary tract infection) 08/11/2014  . Anemia 07/25/2014  . NSTEMI, initial episode of care 06/17/2014  . Degenerative arthritis of hip 04/11/2014  . Pre-operative cardiovascular examination 02/27/2014  . COPD (chronic obstructive pulmonary disease)   . DM2 (diabetes mellitus, type 2)   . Hyperlipidemia   . Chest pain 12/21/2013  . Femoral distal fracture 07/28/2013  . Chronic systolic heart failure 05/03/2013  . Congestive heart failure 04/08/2013  . CAD (coronary artery disease)   . Peripheral vascular disease   . Paroxysmal atrial fibrillation   . HTN (hypertension)   . Atherosclerosis of native arteries of the extremities with intermittent claudication 03/19/2011    No current facility-administered medications on file prior to encounter.   Current Outpatient Prescriptions on File Prior to Encounter  Medication Sig Dispense Refill  . amiodarone (PACERONE) 200 MG tablet Take 1 tablet (200 mg total) by mouth daily. 90 tablet 3  . apixaban (ELIQUIS) 2.5 MG TABS tablet Take 1 tablet (2.5 mg total) by mouth 2 (two) times daily.  60 tablet 6  . benazepril (LOTENSIN) 20 MG tablet Take 20 mg by mouth daily.    . carvedilol (COREG) 3.125 MG tablet Take 1 tablet (3.125 mg total) by mouth 2 (two) times daily. 180 tablet 3  . gabapentin (NEURONTIN) 400 MG capsule Take 400 mg by mouth 3 (three) times daily.     [name] HYDROcodone-acetaminophen (NORCO/VICODIN) 5-325 MG per tablet Take 1-2 tablets by mouth 2 (two) times daily as needed for moderate pain. 30 tablet 0  . insulin lispro (HUMALOG) 100 UNIT/ML injection Inject 0.04 mLs (4 Units total) into the skin 3 (three) times daily before meals. (Patient taking differently: Inject 10 Units into the skin 3 (three) times daily before meals. ) 10 mL 11  . INVANZ 1 G injection Inject 1 g into the muscle daily.     . isosorbide mononitrate (IMDUR) 60 MG 24 hr tablet Take 90 mg by mouth daily.     [name] LANTUS SOLOSTAR 100 UNIT/ML Solostar Pen Inject 32 Units into the skin at bedtime.     [name] levothyroxine (SYNTHROID, LEVOTHROID) 100 MCG tablet Take 100 mcg by mouth daily.     . nitroGLYCERIN (NITROSTAT) 0.4 MG SL tablet Place 1 tablet (0.4 mg total) under the tongue every 5 (five) minutes as needed for chest pain. 30 tablet 0  . nystatin cream (MYCOSTATIN) Apply 1 application topically 2 (two) times daily. 30 g 1  . pantoprazole (PROTONIX) 40 MG tablet Take 40 mg by mouth daily.    . potassium chloride (K-DUR) 10 MEQ tablet Take 10 mEq by mouth daily.     . pravastatin (PRAVACHOL) 40 MG tablet Take 1 tablet (40 mg total) by mouth daily. 90 tablet 3  . torsemide (DEMADEX) 20 MG tablet Take 40 mg in the morning and 20 mg in the afternoon. (Patient taking differently: Take 20 mg by mouth 2 (two) times daily. ) 180 tablet 3  . albuterol (PROVENTIL) (2.5 MG/3ML) 0.083% nebulizer solution Take 3 mLs (2.5 mg total) by nebulization every 6 (six) hours as needed for wheezing or shortness of breath. 150 mL 1  . docusate sodium (COLACE) 100 MG capsule Take 1 capsule (100 mg total) by mouth 2 (two) times daily. (Patient not taking: Reported on 10/17/2014) 60 capsule 0  . polyethylene glycol (MIRALAX / GLYCOLAX) packet Take 17 g by mouth daily as needed for mild constipation or moderate constipation. (Patient not taking: Reported on 10/17/2014) 14 each 0    Past Medical History   Diagnosis Date  . DM2 (diabetes mellitus, type 2)     onset age 44 insulin dependent  . Hyperlipidemia   . COPD (chronic obstructive pulmonary disease)   . Peripheral vascular disease   . CAD (coronary artery disease)     a. MI 1988 s/p CABG in 1988, multiple PCI on SVGs; b. cath 2012: occluded native coronary arteries, patent LIMA to LAD (distal LAD dz), patent SVG-OM & occluded SVG-RCA which filled via left to right collats; c. cath 12/2013 patent LIMA-LAD & SVG-O. known occluded VG-RCA w/ L-R collats, no change since cath 2012  . Chronic systolic CHF (congestive heart failure)     a. 03/2012: EF 40-45%, mild lateral wall HK, mod inf HK, mod post wall HK, mild LVH, mild MR/TR   . Paroxysmal atrial fibrillation     a. CHADSVASc 7: yearly risk of CVA 9.6%;. b. on Eliquis 2.5 mg bid (age, SCr, borderline wt 62.9 Kg)   . HTN (hypertension)   . Yeast infection   .  UTI (urinary tract infection)     Recurrent ESBL E.coli UTI  . Atherosclerosis of native artery of left lower extremity   . Myocardial infarction   . CKD (chronic kidney disease) stage 3, GFR 30-59 ml/min   . Hypoxia     on nocturnal o2    Past Surgical History  Procedure Laterality Date  . Abdominal hysterectomy    . Coronary artery bypass graft    . Appendectomy    . Cholecystectomy    . Ovary surgery    . Angioplasty / stenting femoral      Bilateral SFA stenting  . Femur surgery    . Cardiac catheterization      [facility]  . Cardiac catheterization  12/2013  . Right hip replacement    . Total knee arthroplasty      right knee    Social History  Substance Use Topics  . Smoking status: Former Smoker -- 15 years    Types: Cigarettes    Quit date: 02/18/1988  . Smokeless tobacco: Never Used  . Alcohol Use: No    Family History  Problem Relation Age of Onset  . Diabetes Mother   . Heart disease Mother   . Hypertension Mother   . Heart disease Father   . Heart disease Brother   . CVA Father     PE: Filed  Vitals:   10/20/14 0431 10/20/14 1120 10/20/14 1143 10/20/14 1146  BP: 131/62 82/63 118/52 123/64  Pulse: 75 73 66 64  Temp: 97.5 F (36.4 C) 97.8 F (36.6 C)    TempSrc: Oral Oral    Resp: 20 14    Height:      Weight:      SpO2: 90% 98%     Patient appears to be in no acute distress  patient is alert and oriented x3 Atraumatic normocephalic head No cervical or supraclavicular lymphadenopathy appreciated No increased work of breathing, no audible wheezes/rhonchi Regular sinus rhythm/rate Abdomen is soft, nontender, nondistended, no CVA or suprapubic tenderness Lower extremities are symmetric without appreciable edema Grossly neurologically intact No identifiable skin lesions   Recent Labs  10/18/14 0358 10/19/14 0419 10/20/14 0500  WBC 4.6 4.6 5.6  HGB 8.8* 8.8* 9.3*  HCT 29.0* 28.5* 30.7*    Recent Labs  10/18/14 0358 10/19/14 0419 10/20/14 0500  NA 139 140 138  K 4.7 4.1 4.2  CL 103 103 101  CO2 GLUCOSE 95 99 239*  BUN 37* 35* 37*  CREATININE 2.22* 1.98* 2.07*  CALCIUM 7.9* 7.9* 8.1*   No results for input(s): LABPT, INR in the last 72 hours. No results for input(s): LABURIN in the last 72 hours. Results for orders placed or performed during the hospital encounter of 10/17/14  Blood culture (routine x 2)     Status: None (Preliminary result)   Collection Time: 10/17/14  9:07 AM  Result Value Ref Range Status   Specimen Description BLOOD LEFT ASSIST CONTROL  Final   Special Requests BOTTLES DRAWN AEROBIC AND ANAEROBIC  1 CC  Final   Culture NO GROWTH 3 DAYS  Final   Report Status PENDING  Incomplete  Blood culture (routine x 2)     Status: None (Preliminary result)   Collection Time: 10/17/14  9:07 AM  Result Value Ref Range Status   Specimen Description BLOOD RIGHT ASSIST CONTROL  Final   Special Requests BOTTLES DRAWN AEROBIC AND ANAEROBIC  1 CC  Final   Culture NO GROWTH 3  DAYS  Final   Report Status PENDING  Incomplete  Urine culture      Status: None   Collection Time: 10/17/14  5:51 PM  Result Value Ref Range Status   Specimen Description URINE, CLEAN CATCH  Final   Special Requests NONE  Final   Culture 20,000 COLONIES/mL CANDIDA ALBICANS  Final   Report Status 10/20/2014 FINAL  Final  MRSA PCR Screening     Status: None   Collection Time: 10/17/14  5:51 PM  Result Value Ref Range Status   MRSA by PCR NEGATIVE NEGATIVE Final    Comment:        The GeneXpert MRSA Assay (FDA approved for NASAL specimens only), is one component of a comprehensive MRSA colonization surveillance program. It is not intended to diagnose MRSA infection nor to guide or monitor treatment for MRSA infections.     Imaging: none  Imp: The patient has a history of recurrent urinary tract infections, recently growing extended beta lactamase Escherichia coli. Her urine culture on this admission demonstrates no evidence of bacterial infection. She does have some pyuria. This could be contamination, it also could be associated with the yeast that is growing in her culture. Is unclear whether she empties her bladder completely or not. She does have some severe urinary incontinence. Her CT scan demonstrated no GU abnormality or kidney stone that could be treated to her recurrent infections.  Recommendations: Given the patient has a negative urine culture, would not treat her for an infection at this point. Would only treat her based on positive cultures in the future. Further, I would recommend the patient be started on cranberry tablets twice daily as well as a probiotic, preferably lactobacillus initially, to help restore her normal vaginal/perineal flora. The patient while in the hospital should be getting bladder scanned following each void to measure postvoid residual. If the patient's post-void residuals greater than 100 mL she should be cathed.  She should also be encouraged to double void each time she voids, ie void and then 10 minutes later  void again even if she does not have the urge. She should also be on a timed voiding schedule, every 4 hours. We will plan to see her in follow-up in the clinic and complete her recurrent UTI workup with cystoscopy.  Thank you for involving me in this patient's care, please page with any further questions or concerns. Berniece Salines W

## 2014-10-20 NOTE — Progress Notes (Signed)
    Was heading into different patient's room and called urgently into this patient's room by staff with HR in the upper 20's to 30's on telemetry. Upon entering her room she was sitting up in bed eating breakfast and drinking her coffee asking "what's going on?" She was completely asymptomatic. I checked her manual radial pulse with this being 68 bpm and rechecked at 64 bpm. I ordered a stat 12-lead ECG. Upon reviewing telemetry the HR's in question are full of artifact and unlikely to be true rates. Awaiting ECG tech to arrive.    Eula Listen, PA-C 10/20/2014 8:33 AM

## 2014-10-20 NOTE — Progress Notes (Signed)
Inpatient Diabetes Program Recommendations  AACE/ADA: New Consensus Statement on Inpatient Glycemic Control (2013)  Target Ranges:  Prepandial:   less than 140 mg/dL      Peak postprandial:   less than 180 mg/dL (1-2 hours)      Critically ill patients:  140 - 180 mg/dL   Review of Glycemic Control:  Results for CIDNEY, KIRKWOOD (MRN [id number]) as of 10/20/2014 13:11  Ref. Range 10/19/2014 12:20 10/19/2014 16:30 10/19/2014 20:28 10/20/2014 07:34 10/20/2014 11:21  Glucose-Capillary Latest Ref Range: 65-99 mg/dL 865 (H) 784 (H) 696 (H) 226 (H) 186 (H)    Diabetes history: Type 2  Outpatient Diabetes medications: Humalog 4 units tid with meals.  Lantus 32 units q HS Current orders for Inpatient glycemic control: Novolog sensitive tid with meals and HS  Please consider restarting 1/2 of patient's home dose of Lantus. Consider Lantus 16 units q HS.  Thanks, Beryl Meager, RN, BC-ADM Inpatient Diabetes Coordinator Pager [phone] (8a-5p)

## 2014-10-20 NOTE — Care Management Important Message (Signed)
Important Message  Patient Details  Name: ANABELEN KAMINSKY MRN: [id number] Date of Birth: 12/23/1931   Medicare Important Message Given:  Yes-second notification given    Eber Hong, RN 10/20/2014, 10:03 AM

## 2014-10-20 NOTE — Progress Notes (Signed)
Dr. Winona Legato notified of CT results showing bilat. Pleural effusions.

## 2014-10-20 NOTE — Clinical Social Work Placement (Signed)
   CLINICAL SOCIAL WORK PLACEMENT  NOTE  Date:  10/20/2014  Patient Details  Name: [name] MRN: [id number] Date of Birth: 1931/03/27  Clinical Social Work is seeking post-discharge placement for this patient at the Skilled  Nursing Facility level of care (*CSW will initial, date and re-position this form in  chart as items are completed):  Yes   Patient/family provided with [facility] Clinical Social Work Department's list of facilities offering this level of care within the geographic area requested by the patient (or if unable, by the patient's family).  Yes   Patient/family informed of their freedom to choose among providers that offer the needed level of care, that participate in Medicare, Medicaid or managed care program needed by the patient, have an available bed and are willing to accept the patient.  Yes   Patient/family informed of 's ownership interest in [facility] and [facility], as well as of the fact that they are under no obligation to receive care at these facilities.  PASRR submitted to EDS on       PASRR number received on       Existing PASRR number confirmed on       FL2 transmitted to all facilities in geographic area requested by pt/family on 10/20/14     FL2 transmitted to all facilities within larger geographic area on       Patient informed that his/her managed care company has contracts with or will negotiate with certain facilities, including the following:            Patient/family informed of bed offers received.  Patient chooses bed at       Physician recommends and patient chooses bed at      Patient to be transferred to   on  .  Patient to be transferred to facility by       Patient family notified on   of transfer.  Name of family member notified:        PHYSICIAN Please sign FL2     Additional Comment:    _______________________________________________ Chauncy Passy, LCSW 10/20/2014, 3:54 PM

## 2014-10-20 NOTE — Progress Notes (Signed)
[facility] CLINIC INFECTIOUS DISEASE PROGRESS NOTE Date of Admission:  10/17/2014     ID: [name] is a 81 y.o. female with  pna and recurrent UTI  Active Problems:   Pneumonia   Acute respiratory failure with hypoxia   Elevated troponin   Recurrent UTI   Subjective: Had CT scan. Feels a little stronger. No fevers  ROS  Eleven systems are reviewed and negative except per hpi  Medications:  Antibiotics Given (last 72 hours)    Date/Time Action Medication Dose Rate   10/17/14 1622 Given   meropenem (MERREM) 500 mg in sodium chloride 0.9 % 50 mL IVPB 500 mg 100 mL/hr   10/17/14 2251 Given   meropenem (MERREM) 500 mg in sodium chloride 0.9 % 50 mL IVPB 500 mg 100 mL/hr   10/18/14 0918 Given   azithromycin (ZITHROMAX) tablet 500 mg 500 mg    10/18/14 0918 Given   meropenem (MERREM) 500 mg in sodium chloride 0.9 % 50 mL IVPB 500 mg 100 mL/hr   10/18/14 2106 Given   meropenem (MERREM) 500 mg in sodium chloride 0.9 % 50 mL IVPB 500 mg 100 mL/hr   10/19/14 0901 Given   meropenem (MERREM) 500 mg in sodium chloride 0.9 % 50 mL IVPB 500 mg 100 mL/hr   10/19/14 0902 Given   azithromycin (ZITHROMAX) tablet 500 mg 500 mg    10/19/14 2323 Given   meropenem (MERREM) 500 mg in sodium chloride 0.9 % 50 mL IVPB 500 mg 100 mL/hr   10/20/14 0952 Given   meropenem (MERREM) 500 mg in sodium chloride 0.9 % 50 mL IVPB 500 mg 100 mL/hr     . amiodarone  200 mg Oral Daily  . feeding supplement  1 Container Oral TID BM  . gabapentin  400 mg Oral TID  . insulin aspart  0-5 Units Subcutaneous QHS  . insulin aspart  0-9 Units Subcutaneous TID WC  . insulin glargine  15 Units Subcutaneous Daily  . levothyroxine  100 mcg Oral QAC breakfast  . meropenem (MERREM) IV  500 mg Intravenous Q12H  . nystatin cream  1 application Topical BID  . pantoprazole  40 mg Oral Daily  . pravastatin  40 mg Oral Daily  . sodium chloride  3 mL Intravenous Q12H  . torsemide  20 mg Oral Daily     Objective: Vital signs in last 24 hours: Temp:  [97.5 F (36.4 C)-97.8 F (36.6 C)] 97.8 F (36.6 C) (09/02 1120) Pulse Rate:  [64-75] 64 (09/02 1146) Resp:  [14-20] 14 (09/02 1120) BP: (82-131)/(52-64) 123/64 mmHg (09/02 1146) SpO2:  [90 %-98 %] 98 % (09/02 1120) Constitutional: oriented to person, place, and time. Frail HENT: [place]/AT, PERRLA, no scleral icterus Mouth/Throat: Oropharynx is clear and moist. No oropharyngeal exudate.  Cardiovascular: Normal rate, regular rhythm and normal heart sounds.  Pulmonary/Chest: rhonchi bil Neck = supple, no nuchal rigidity Abdominal: Soft. Bowel sounds are normal. exhibits no distension. There is no tenderness.  Lymphadenopathy: no cervical adenopathy. No axillary adenopathy Neurological: alert and oriented to person, place, and time.  Skin: Skin is warm and dry. No rash noted. No erythema.  Psychiatric: a normal mood and affect. behavior is normal.  PICC RUE  Lab Results  Recent Labs  10/19/14 0419 10/20/14 0500  WBC 4.6 5.6  HGB 8.8* 9.3*  HCT 28.5* 30.7*  NA 140 138  K 4.1 4.2  CL 103 101  CO2 29 29  BUN 35* 37*  CREATININE 1.98* 2.07*  Microbiology: @ Studies/Results: Ct Renal Stone Study  10/20/2014   CLINICAL DATA:  Recurrent urinary tract infections.  Dysuria.  EXAM: CT ABDOMEN AND PELVIS WITHOUT CONTRAST  TECHNIQUE: Multidetector CT imaging of the abdomen and pelvis was performed following the standard protocol without IV contrast.  COMPARISON:  06/14/2014  FINDINGS: Lower chest: Left worse than right base airspace disease. Cardiomegaly with prior median sternotomy and dense coronary artery atherosclerosis. Small bilateral pleural effusions.  Hepatobiliary: Mildly hyper attenuating liver. Cholecystectomy, without biliary ductal dilatation.  Pancreas: Moderate pancreatic atrophy.  Spleen: Normal  Adrenals/Urinary Tract: Left adrenal myelolipoma measures 1.7 cm. Normal right adrenal gland. Mild renal  cortical thinning bilaterally. Favor left renal vascular calcifications on image 39 of series 2. No definite left and no right sided renal calculi. Mild nonspecific perinephric interstitial thickening, increased. No hydroureter  Degraded evaluation of the pelvis, secondary to beam hardening artifact from right hip arthroplasty. Given this factor, no bladder stones identified.  Stomach/Bowel: Normal stomach, without wall thickening. Extensive colonic diverticulosis. Muscular hypertrophy involving the sigmoid. Edema adjacent the sigmoid is resolved. Normal terminal ileum and appendix. Normal small bowel caliber.  Vascular/Lymphatic: Advanced aortic and branch vessel atherosclerosis. No abdominopelvic adenopathy.  Reproductive: Hysterectomy.  No adnexal mass.  Other: New presacral edema. New small volume abdominal ascites. Significantly increased anasarca.  Musculoskeletal: Moderate to marked left hip osteoarthritis. Right hip arthroplasty. Osteopenia. Trace L4-5 anterolisthesis.  IMPRESSION: 1. Left renal calcifications are favored to be vascular. No convincing evidence of urinary tract calculi and no evidence of hydronephrosis. 2. Degraded evaluation of the pelvis, secondary to beam hardening artifact from right hip arthroplasty. 3. New bilateral pleural effusions, ascites, and anasarca, suggesting fluid overload. 4. Bibasilar airspace opacities. Favor atelectasis, especially on the left. Left base pneumonia cannot be excluded. 5. Hyper attenuating liver could relate to iron deposition or prior amiodarone usage. 6. Left adrenal myelolipoma. 7. Advanced atherosclerosis. 8. Progressive perirenal interstitial thickening is nonspecific and could relate to fluid overload. Pyelonephritis could look similar.   Electronically Signed   By: Jeronimo Greaves M.D.   On: 10/20/2014 09:40    Assessment/Plan: [name] is a 81 y.o. female with recurrent ESBL E coli UTI now s.p 2 course of IV ertapenem admitted with cough,  sob, fever and cxr evidence of PNA. Just recently finished IV ertapenem. Her UA was floridly positive with TNTC WBC and WBC clumps. However cx is only growing yeast. SHe has been followed by Dr Apolinar Junes in urology.  Last renal imagine in April showed no stones, hydro. She does not recall any history of a cystoscopy.  PVR seems elevated at 338.  CT shows what are prob vascular calcifications but no definitive evidence of stones.  She needs further urological evaluation to prevent recurrence of her ESBL UTI  Recommendations Cont meropenem -dced azithro as sp CAP treatment course x 3 days  Given elevated PVR I would suggest urology consultation as may need to do I and O cath in future to avoid recurrent UTI May also need cystoscopy to eval etiology of recurrent UTIs and persistent pyuria despite prolonged IV ertapenem directed at the isolated ESBL E coli.  Thank you very much for the consult. Will follow with you.  Wayman Hoard   10/20/2014, 2:42 PM

## 2014-10-20 NOTE — Progress Notes (Addendum)
Alert but more confused as the shift went on. Bed bound. Using bedpan. No complaints. On 2L O2.  Bladder scanned patient and was 0ml. For renal study CT. PICC line in place. IV antibiotics given.

## 2014-10-20 NOTE — Progress Notes (Signed)
Paged Dr. Winona Legato, patient has had no urine output since 2am this morning. Bladder scan showed of urine. MD gave this RN verbal order for i/o cath q8h prn.

## 2014-10-20 NOTE — Progress Notes (Signed)
Patient is very confused this morning, thinks she is in doctors office, that her grandson is hiding under the chair, talking about a tunnel. Patient does answer orientation questions correctly however. Patients daughter called RN and spoke with her about confusion. Dr. Emelia Loron  Has been notified and is with the patient right now.

## 2014-10-20 NOTE — Progress Notes (Addendum)
Patient: [name] / Admit Date: 10/17/2014 / Date of Encounter: 10/20/2014, 7:51 AM   Subjective: Sleepy this morning. CT stone protocol later today given her recurrent ESBL UTIs. ID on board. Notes indicate Afib this morning. Considerable artifact on telemetry. Will check 12-lead. No chest pain, current palpitations, or increased SOB.   Review of Systems: Review of Systems  Constitutional: Positive for weight loss and malaise/fatigue. Negative for fever, chills and diaphoresis.  HENT: Negative for congestion.   Eyes: Negative for discharge and redness.  Respiratory: Positive for cough, shortness of breath and wheezing. Negative for hemoptysis and sputum production.   Cardiovascular: Positive for palpitations. Negative for chest pain, orthopnea, claudication, leg swelling and PND.  Gastrointestinal: Negative for nausea and vomiting.  Genitourinary: Negative for dysuria, urgency, frequency, hematuria and flank pain.  Musculoskeletal: Negative for falls.  Skin: Negative for rash.  Neurological: Positive for weakness. Negative for sensory change, speech change and focal weakness.  Endo/Heme/Allergies: Does not bruise/bleed easily.  Psychiatric/Behavioral: The patient is not nervous/anxious.   All other systems reviewed and are negative.   Objective: Telemetry: Considerable artifact, notes indicate Afib, though this is not definitively seen through artifact, will check 12-lead  Physical Exam: Blood pressure 131/62, pulse 75, temperature 97.5 F (36.4 C), temperature source Oral, resp. rate 20, height 5\' 1"  (1.549 m), weight 156 lb 4.8 oz (70.897 kg), SpO2 90 %. Body mass index is 29.55 kg/(m^2). General: Well developed, well nourished, in no acute distress. Head: Normocephalic, atraumatic, sclera non-icteric, no xanthomas, nares are without discharge. Neck: Negative for carotid bruits. JVP not elevated. Lungs: Decreased breath sounds bilaterally. Breathing is unlabored. Heart:  RRR S1 S2 without murmurs, rubs, or gallops.  Abdomen: Soft, non-tender, non-distended with normoactive bowel sounds. No rebound/guarding. Extremities: No clubbing or cyanosis. No edema. Distal pedal pulses are 2+ and equal bilaterally. Neuro: Alert and oriented X 3. Moves all extremities spontaneously. Psych:  Responds to questions appropriately with a normal affect.   Intake/Output Summary (Last 24 hours) at 10/20/14 0751 Last data filed at 10/20/14 0432  Gross per 24 hour  Intake    100 ml  Output    400 ml  Net   -300 ml    Inpatient Medications:  . amiodarone  200 mg Oral Daily  . feeding supplement  1 Container Oral TID BM  . gabapentin  400 mg Oral TID  . insulin aspart  0-5 Units Subcutaneous QHS  . insulin aspart  0-9 Units Subcutaneous TID WC  . levothyroxine  100 mcg Oral QAC breakfast  . meropenem (MERREM) IV  500 mg Intravenous Q12H  . nystatin cream  1 application Topical BID  . pantoprazole (PROTONIX) IV  40 mg Intravenous Q12H  . pravastatin  40 mg Oral Daily  . sodium chloride  3 mL Intravenous Q12H  . torsemide  20 mg Oral Daily   Infusions:    Labs:  Recent Labs  10/19/14 0419 10/20/14 0500  NA 140 138  K 4.1 4.2  CL 103 101  CO2 29 29  GLUCOSE 99 239*  BUN 35* 37*  CREATININE 1.98* 2.07*  CALCIUM 7.9* 8.1*   No results for input(s): AST, ALT, ALKPHOS, BILITOT, PROT, ALBUMIN in the last 72 hours.  Recent Labs  10/19/14 0419 10/20/14 0500  WBC 4.6 5.6  HGB 8.8* 9.3*  HCT 28.5* 30.7*  MCV 76.9* 77.7*  PLT 197 235    Recent Labs  10/17/14 1125 10/17/14 1419 10/17/14 2117 10/18/14 1037  CKMB 2.1 2.1 2.6  --   TROPONINI 0.14* 0.18* 0.27* 0.24*   Invalid input(s): POCBNP No results for input(s): HGBA1C in the last 72 hours.   Weights: Filed Weights   10/17/14 0600 10/19/14 1213  Weight: 145 lb (65.772 kg) 156 lb 4.8 oz (70.897 kg)     Radiology/Studies:  Dg Chest 2 View  09/29/2014   CLINICAL DATA:  Substernal chest pain and  abdominal pain since yesterday.  EXAM: CHEST - 2 VIEW; ABDOMEN - 2 VIEW  COMPARISON:  Chest x-ray 09/03/2014  FINDINGS: Chest x-ray:  The right PICC line is stable. Stable cardiac enlargement and tortuous ectatic and calcified thoracic aorta. There are chronic lung changes with possible superimposed interstitial edema or bronchitis. There is a persistent left lower lobe process which is likely a combination of effusion and atelectasis or infiltrate.  Two-view abdomen:  The abdominal bowel gas pattern suggests a mild diffuse ileus or possible gastroenteritis. There are air-filled small bowel loops without distention and some scattered air and stool throughout the colon. No free air. The soft tissue shadows are grossly maintained. Extensive vascular calcifications are noted. The bony structures are grossly intact. There is a right hip prosthesis and severe degenerative changes involving the left hip.  IMPRESSION: 1. Persistent left lower lobe process likely a combination of effusion, atelectasis and/or infiltrate. 2. Underline chronic lung changes. 3. Stable right PICC line. 4. Abdominal bowel gas pattern suggests a mild ileus or gastroenteritis. No findings for small bowel obstruction or free air.   Electronically Signed   By: Rudie Meyer M.D.   On: 09/29/2014 09:23   Dg Chest Port 1 View  10/18/2014   CLINICAL DATA:  Evaluate pneumonia  EXAM: PORTABLE CHEST - 1 VIEW  COMPARISON:  Yesterday  FINDINGS: Unchanged cardiopericardial enlargement. Stable mediastinal contours with distortion from leftward rotation.  Right upper extremity PICC, tip stable at the SVC level.  Unchanged diffuse interstitial coarsening and dense retrocardiac opacity with air bronchograms and hazy appearance superiorly.  Remote left humeral neck fracture.  IMPRESSION: 1. Unchanged retrocardiac pneumonia or atelectasis with effusion. 2. Stable cardiomegaly and mild pulmonary edema.   Electronically Signed   By: Marnee Spring M.D.   On:  10/18/2014 08:49   Dg Chest Portable 1 View  10/17/2014   CLINICAL DATA:  Dyspnea and chest tightness.  EXAM: PORTABLE CHEST - 1 VIEW  COMPARISON:  09/29/2014  FINDINGS: There is a right upper extremity PICC line with tip in the SVC. There is moderate unchanged cardiomegaly. There is left base consolidation and effusion, unchanged. The right lung is clear.  IMPRESSION: Left base consolidation and effusion.  Cardiomegaly.   Electronically Signed   By: Ellery Plunk M.D.   On: 10/17/2014 06:20   Dg Abd 2 Views  09/30/2014   CLINICAL DATA:  Abdominal pain, tightness, burping and belching with nausea  EXAM: ABDOMEN - 2 VIEW  COMPARISON:  09/29/2014  FINDINGS: The bowel gas pattern is normal. There is no evidence of free air. No radio-opaque calculi or other significant radiographic abnormality is seen.  Partially visualize is left lower lobe airspace disease and a small left pleural effusion. There is stable cardiomegaly. There is evidence of prior CABG.  Right total hip arthroplasty. Severe osteoarthritis of the left hip.  IMPRESSION: 1. No bowel obstruction. 2. Severe osteoarthritis of the left hip.   Electronically Signed   By: Elige Ko   On: 09/30/2014 10:08   Dg Abd 2 Views  09/29/2014  CLINICAL DATA:  Substernal chest pain and abdominal pain since yesterday.  EXAM: CHEST - 2 VIEW; ABDOMEN - 2 VIEW  COMPARISON:  Chest x-ray 09/03/2014  FINDINGS: Chest x-ray:  The right PICC line is stable. Stable cardiac enlargement and tortuous ectatic and calcified thoracic aorta. There are chronic lung changes with possible superimposed interstitial edema or bronchitis. There is a persistent left lower lobe process which is likely a combination of effusion and atelectasis or infiltrate.  Two-view abdomen:  The abdominal bowel gas pattern suggests a mild diffuse ileus or possible gastroenteritis. There are air-filled small bowel loops without distention and some scattered air and stool throughout the colon. No  free air. The soft tissue shadows are grossly maintained. Extensive vascular calcifications are noted. The bony structures are grossly intact. There is a right hip prosthesis and severe degenerative changes involving the left hip.  IMPRESSION: 1. Persistent left lower lobe process likely a combination of effusion, atelectasis and/or infiltrate. 2. Underline chronic lung changes. 3. Stable right PICC line. 4. Abdominal bowel gas pattern suggests a mild ileus or gastroenteritis. No findings for small bowel obstruction or free air.   Electronically Signed   By: Rudie Meyer M.D.   On: 09/29/2014 09:23   Dg Abd Portable 1v  10/17/2014   CLINICAL DATA:  Nausea and vomiting.  EXAM: PORTABLE ABDOMEN - 1 VIEW  COMPARISON:  09/30/2014 abdominal radiographs.  FINDINGS: Portable supine abdominal radiographs demonstrate no disproportionately dilated small bowel loops to suggest a small bowel obstruction. No evidence of pneumatosis or pneumoperitoneum. Cholecystectomy clips are seen in the right upper quadrant. Visualized median sternotomy wires are aligned and intact. Stable cardiomegaly and likely small left pleural effusion. Partially visualized right total hip arthroplasty, the visualized portions of which appear well-positioned. Stable moderate degenerative changes in the visualized thoracolumbar spine and severe osteoarthritis in the weight-bearing portion of the left hip joint. Prominent vascular calcifications in the abdominal aorta and branch vessels.  IMPRESSION: 1. Nonobstructive bowel gas pattern. 2. Cardiomegaly and small left pleural effusion.   Electronically Signed   By: Delbert Phenix M.D.   On: 10/17/2014 17:28     Assessment and Plan  81 y.o. female with h/o CAD s/p CABG in 1988, persistent atrial fibrillation on Eliquis, ischemic cardiomyopathy, COPD, PAD/PVD, DM2, HTN, and HLD who has been recently admitted to [facility] in mid June, mid July for ESBL UTI, HCAP, and acute on chronic systolic CHF, and mid  August for Ileus presented to [facility] again on 8/30 with increased SOB and was found to have a left base pneumonia, consistent with HCAP.   1. Elevated troponin/CAD s/p CABG: -Likely supply demand ischemia in the setting of HCAP, sepsis, acute on CKD stage III, anemia, and possible acute on chronic systolic CHF -No further ischemic evaluation at this time given the above recent cardiac catheterization  -No angina  -Cardiac catheterization this year showed no change in coronary anatomy  2. HCAP with sepsis: -4th admission since June for either ESBL UTI or PNA -She is no longer hypotensive -ID on board  3.Persistent atrial fibrillation:  -Check 12 lead ECG given considerable artifact earlier this morning -Maintaining in sinus rhythm on amiodarone currently with lead replacement  -ResumeEliquis 2.5 mg bid once anemia situation improves and no active source of bleeding. -Would continue amiodarone 200 mg daily as she is at significantly increased risk of going into Afib with RVR given her PNA -CHADSVASc at least 7 giving her an estimated annual stroke risk of 9.6%  4. ESBL UTI: -CT stone protocol today -ID on board as above  5. Acute on chronic anemia: -Occult blood negative -Trending up s/p transfusion  -Could resume Eliquis if stable   6. Acute on CKD stage III: -Renal function trending up -Hold torsemide  7. Chronic systolic CHF: -Torsemide held as above -Previously hypotensive which precluded the usage of her HF medications -Restart in stepwise fashion as BP continues to improve    Signed, Eula Listen, PA-C Pager: [phone] 10/20/2014, 7:51 AM   Attending Note Patient seen and examined, agree with detailed note above,  Patient presentation and plan discussed on rounds.   More confused today, No complaints. Not eating or drinking very much, little bit of boost/fruit. --agree with holding diuretics given elevated BUN and creatinine, Low po intake. --on ABX for  infection. Appears to be euvolemic, if not prerenal at this time. --No further cardiac workup at this time Agree with continuing amiodarone po  Signed: Dossie Arbour  M.D., Ph.D.

## 2014-10-20 NOTE — Care Management (Signed)
Patient is well known to this CM and presents from home where she lives with her granddaughter  [name] who  is a Designer, jewellery.   Patient is not very mobile.  [name] says that patient has now become more bedbound over the last 2 weeks.   Last admission she would transfer herself to a wheel chair and spend a majority of her time.   If patient needs skilled nursing placement- would want patient to go to [facility].  There would not be a problem with a bed offer unless the facility is full.  Patient is very confused.  These symptoms started Tuesday.  She had completed home IV antibiotics three days prior to admission.  She has been treated for chronic UTI.  Was to have had a  cystoscopy as an outpatient "sometime this month".   ID does not feel that the PICC line that has been in place for the past 6 weeks is a contributing factor to any  infectious process

## 2014-10-20 NOTE — Progress Notes (Signed)
Notified Dr. Betti Cruz of patient switching into AFib

## 2014-10-20 NOTE — Evaluation (Signed)
Physical Therapy Evaluation Patient Details Name: [name] MRN: [id number] DOB: 11/28/1931 Today's Date: 10/20/2014   History of Present Illness  presented to ER secondary to worsening cough, SOB, CP; admitted with CAP (LLL infiltrate).  Hospital course significant for transfer to CCU secondary to hypotension, hypoxia due to anemia, sepsis.  Now, returned to floor and on 2L supplemental O2 via [place].  Of note, patient with at least 4 admissions since June (due to UTI, PNA); most recent 8/12-8/13/16 due to ileus.  Clinical Impression  Upon evaluation, patient alert; generally disoriented to self, location and situation.  However, follows simple commands and provides relatively accurate accounts of social history and PLOF.  Patient globally weak and deconditioned, tolerates very limited movement of L hip (limited by pain).  Currently requiring mod assist for all bed mobility; min/mod assist for initial sitting balance, progressing to close sup with accommodation to position.  Refuses attempts at standing or transfers this date; may benefit from trial of SB to decrease pressure through L hip and to decrease caregiver burden? Would benefit from skilled PT to address above deficits and promote optimal return to PLOF recommend transition to STR upon discharge from acute hospitalization, provided patient can formally participate/progress with therapy during acute hospitalization.  Will trial 3-5 visits and closely monitor throughout stay and update recommendations as appropriate.      Follow Up Recommendations SNF    Equipment Recommendations  Rolling walker with 5" wheels (SB vs. hoyer lift? will assess in subsequent sessions)    Recommendations for Other Services       Precautions / Restrictions Precautions Precautions: Fall Precaution Comments: contact isolation Restrictions Weight Bearing Restrictions: No      Mobility  Bed Mobility Overal bed mobility: Needs Assistance Bed  Mobility: Supine to Sit     Supine to sit: Mod assist        Transfers                 General transfer comment: patient refused any attempts at standing/transfers due to L hip pain  Ambulation/Gait             General Gait Details: non-ambulatory at baseline  Stairs            Wheelchair Mobility    Modified Rankin (Stroke Patients Only)       Balance Overall balance assessment: Needs assistance Sitting-balance support: No upper extremity supported;Feet supported Sitting balance-Leahy Scale: Fair Sitting balance - Comments: initially requiring min/mod assist for sitting balance, improving to close sup with accommodation to position                                     Pertinent Vitals/Pain Pain Assessment: Faces Pain Score: 6  Pain Location: L hip Pain Descriptors / Indicators: Aching Pain Intervention(s): Limited activity within patient's tolerance;Monitored during session;Repositioned    Home Living Family/patient expects to be discharged to:: Private residence Living Arrangements: Other relatives (granddaughter) Available Help at Discharge: Family Type of Home: House         Home Equipment: Wheelchair - manual;Hospital bed (per previous records) Additional Comments: patient unable to provide accurate information    Prior Function Level of Independence: Needs assistance         Comments: largely WC bound due to worsening L hip pain (needs replacement, not eligible until L LE wound heals).  Granddaughter/son assist with all SPT (bed/WC), but per  chart, have experience increased difficulty in recent weeks due to progressive weakness/debility of patient.     Hand Dominance        Extremity/Trunk Assessment   Upper Extremity Assessment: Overall WFL for tasks assessed           Lower Extremity Assessment:  (globally at least 3-/5, L hip limited by pain.  Bilat ankles to neutral DF)      Cervical / Trunk  Assessment: Kyphotic  Communication      Cognition Arousal/Alertness: Awake/alert Behavior During Therapy: WFL for tasks assessed/performed Overall Cognitive Status: Impaired/Different from baseline Area of Impairment: Orientation;Memory;Safety/judgement;Awareness;Problem solving Orientation Level: Disoriented to;Situation;Time;Person ("sometimes my name is Barbara Cower, and sometimes it is Press photographer")   Memory: Decreased short-term memory   Safety/Judgement: Decreased awareness of safety;Decreased awareness of deficits   Problem Solving: Requires verbal cues;Requires tactile cues General Comments: globally confused, hallucinating; but consistently follows commands and able to report some details of PLOF with fair accuracy during evaluation.    General Comments General comments (skin integrity, edema, etc.): scabbed wounds to bilat medial malleoli, L > R    Exercises        Assessment/Plan    PT Assessment Patient needs continued PT services  PT Diagnosis Generalized weakness;Altered mental status   PT Problem List Decreased strength;Decreased range of motion;Decreased activity tolerance;Decreased balance;Decreased mobility;Decreased cognition;Decreased knowledge of use of DME;Decreased safety awareness;Decreased knowledge of precautions;Cardiopulmonary status limiting activity;Pain  PT Treatment Interventions DME instruction;Functional mobility training;Therapeutic activities;Therapeutic exercise;Balance training;Patient/family education   PT Goals (Current goals can be found in the Care Plan section) Acute Rehab PT Goals Patient Stated Goal: unable to verbalize PT Goal Formulation: With patient Time For Goal Achievement: 11/03/14 Potential to Achieve Goals: Fair    Frequency Min 2X/week   Barriers to discharge        Co-evaluation               End of Session   Activity Tolerance: Patient limited by pain Patient left: in bed;with call bell/phone within reach;with bed  alarm set           Time: 1610-9604 PT Time Calculation (min) (ACUTE ONLY): 20 min   Charges:   PT Evaluation $Initial PT Evaluation Tier I: 1 Procedure     PT G Codes:       Shenique Childers H. Manson Passey, PT, DPT, NCS 10/20/2014, 4:42 PM [phone]

## 2014-10-21 ENCOUNTER — Inpatient Hospital Stay: Payer: Medicare Other

## 2014-10-21 LAB — GLUCOSE, CAPILLARY
GLUCOSE-CAPILLARY: 176 mg/dL — AB (ref 65–99)
GLUCOSE-CAPILLARY: 154 mg/dL — AB (ref 65–99)
Glucose-Capillary: 72 mg/dL (ref 65–99)
Glucose-Capillary: 93 mg/dL (ref 65–99)

## 2014-10-21 LAB — CREATININE, SERUM
CREATININE: 1.82 mg/dL — AB (ref 0.44–1.00)
GFR calc Af Amer: 29 mL/min — ABNORMAL LOW (ref >60)
GFR, EST NON AFRICAN AMERICAN: 25 mL/min — AB (ref >60)

## 2014-10-21 MED ORDER — SODIUM CHLORIDE 0.9 % IJ SOLN
10.0000 mL | Freq: Two times a day (BID) | INTRAMUSCULAR | Status: DC
  Administered 2014-10-21 – 2014-10-23 (×5): 10 mL
  Administered 2014-10-23: 3 mL
  Administered 2014-10-24: 10 mL

## 2014-10-21 MED ORDER — APIXABAN 2.5 MG PO TABS
2.5000 mg | ORAL_TABLET | Freq: Two times a day (BID) | ORAL | Status: DC
  Administered 2014-10-21 – 2014-10-24 (×6): 2.5 mg via ORAL
  Filled 2014-10-21 (×6): qty 1

## 2014-10-21 MED ORDER — HEPARIN SOD (PORK) LOCK FLUSH 10 UNIT/ML IV SOLN
10.0000 [IU] | Freq: Once | INTRAVENOUS | Status: DC
Start: 2014-10-21 — End: 2014-10-21
  Filled 2014-10-21: qty 5

## 2014-10-21 MED ORDER — SODIUM CHLORIDE 0.9 % IJ SOLN: 10.0000 mL | INTRAMUSCULAR | Status: DC | PRN

## 2014-10-21 NOTE — Progress Notes (Signed)
Bladder scanned patient, patient retaining 508cc., nurse applied slight pressure to lower abdomen, patient voided in bed. Bladder scanned patient,  only retaining 50cc. Will continue to monitor.

## 2014-10-21 NOTE — Progress Notes (Signed)
[facility] - [place] at [facility]   PATIENT NAME: [name]    MR#:  [id number]  DATE OF BIRTH:  23-Oct-1931  SUBJECTIVE:  CHIEF COMPLAINT:   Chief Complaint  Patient presents with  . Shortness of Breath   Admitted for shortness of breath, was noted to have pyuria and admitted for possible recurrence urinary tract infection, ESBL Escherichia coli, chest x-ray revealed left base pneumonia,  initiated on meropenem, now also on Zithromax due to community-acquired pneumonia, being followed by Dr. Sampson Goon who recommends urology follow-up for recurrent UTIs. Patient is very confused today, ammonia level was normal. Patient was seen by urologist and recommended to initiate patient on the cranberry tablets, as well as probiotic and have a bladder scan done. Patient had bladder scan earlier today which revealed 500 cc of urine. Pressure was applied and patient was able to void bladder scan was repeated and patient was noted to have retaining only 50 cc. Patient complains of headache today. Not able to provide more information  REVIEW OF SYSTEMS:  Review of Systems  Constitutional: Negative for fever and chills.  HENT: Negative for ear discharge, hearing loss and tinnitus.   Eyes: Negative for blurred vision and double vision.  Respiratory: Positive for cough and shortness of breath. Negative for wheezing.   Cardiovascular: Negative for chest pain and palpitations.  Gastrointestinal: Positive for heartburn, nausea and abdominal pain. Negative for vomiting, diarrhea and constipation.  Genitourinary: Negative for dysuria.  Neurological: Negative for dizziness, tingling, sensory change, speech change, seizures and headaches.  Psychiatric/Behavioral: Negative for depression. The patient is not nervous/anxious.     DRUG ALLERGIES:   Allergies  Allergen Reactions  . Contrast Media [Iodinated Diagnostic Agents] Shortness Of Breath  . Tramadol Nausea Only  . Valium  [Diazepam] Other (See Comments)    Reaction:  Elevated heart rate  . Iodine Strong [Iodine] Rash  . Penicillins Rash    VITALS:  Blood pressure 129/73, pulse 72, temperature 98.6 F (37 C), temperature source Oral, resp. rate 16, height  (1.549 m), weight 70.897 kg (156 lb 4.8 oz), SpO2 99 %.  PHYSICAL EXAMINATION:  Physical Exam  GENERAL: 81 y.o.-year-old patient lying in the bed with no acute distress. Confused, restless, uncomfortable  EYES: Pupils equal, round, reactive to light and accommodation. Post surgical pupils. No scleral icterus. Extraocular muscles intact.  HEENT: Head atraumatic, normocephalic. Oropharynx and nasopharynx clear.  NECK: Supple, no jugular venous distention. No thyroid enlargement, no tenderness.  LUNGS: Normal breath sounds bilaterally, no wheezing, rales,rhonchi or crepitation. No use of accessory muscles of respiration. [name] CARDIOVASCULAR: S1, S2 normal. No rubs, or gallops. 3/6 systolic murmur heard ABDOMEN: Soft, nontender, nondistended. Bowel sounds present. No organomegaly or mass.  EXTREMITIES: No cyanosis, or clubbing. 2+ pedal edema of right foot and left leg is more swollen in general- which is chronic since her veins were removed for CABG NEUROLOGIC: Cranial nerves II through XII are intact. Muscle strength 5/5 in all extremities. Sensation intact. Gait not checked. Generalized weakness noted. PSYCHIATRIC: The patient is alert and oriented x 3.  SKIN: No obvious rash, lesion, or ulcer.   LABORATORY PANEL:   CBC  Recent Labs Lab 10/20/14 0500  WBC 5.6  HGB 9.3*  HCT 30.7*  PLT 235   ------------------------------------------------------------------------------------------------------------------  Chemistries   Recent Labs Lab 10/17/14 0632  10/20/14 0500 10/21/14 0546  NA 141  < > 138  --   K 4.5  < > 4.2  --  CL 100*  < > 101  --   CO2 27  < > 29  --   GLUCOSE 181*  < > 239*  --   BUN 34*  < > 37*  --    CREATININE 1.95*  < > 2.07* 1.82*  CALCIUM 9.0  < > 8.1*  --   AST 91*  --   --   --   ALT 26  --   --   --   ALKPHOS 101  --   --   --   BILITOT 1.2  --   --   --   < > = values in this interval not displayed. ------------------------------------------------------------------------------------------------------------------  Cardiac Enzymes  Recent Labs Lab 10/18/14 1037  TROPONINI 0.24*   ------------------------------------------------------------------------------------------------------------------  RADIOLOGY:  Ct Head Wo Contrast  10/21/2014   CLINICAL DATA:  Increased confusion and hallucinations.  EXAM: CT HEAD WITHOUT CONTRAST  TECHNIQUE: Contiguous axial images were obtained from the base of the skull through the vertex without intravenous contrast.  COMPARISON:  None.  FINDINGS: There is no evidence of mass effect, midline shift, or extra-axial fluid collections. There is no evidence of a space-occupying lesion or intracranial hemorrhage. There is no evidence of a cortical-based area of acute infarction. There is generalized cerebral atrophy. There is periventricular white matter low attenuation likely secondary to microangiopathy.  The ventricles and sulci are appropriate for the patient's age. The basal cisterns are patent.  Visualized portions of the orbits are unremarkable. The visualized portions of the paranasal sinuses and mastoid air cells are unremarkable. Cerebrovascular atherosclerotic calcifications are noted.  The osseous structures are unremarkable.  IMPRESSION: 1. No acute intracranial pathology. 2. Chronic microvascular disease and cerebral atrophy.   Electronically Signed   By: Elige Ko   On: 10/21/2014 09:26   Ct Renal Stone Study  10/20/2014   CLINICAL DATA:  Recurrent urinary tract infections.  Dysuria.  EXAM: CT ABDOMEN AND PELVIS WITHOUT CONTRAST  TECHNIQUE: Multidetector CT imaging of the abdomen and pelvis was performed following the standard protocol  without IV contrast.  COMPARISON:  06/14/2014  FINDINGS: Lower chest: Left worse than right base airspace disease. Cardiomegaly with prior median sternotomy and dense coronary artery atherosclerosis. Small bilateral pleural effusions.  Hepatobiliary: Mildly hyper attenuating liver. Cholecystectomy, without biliary ductal dilatation.  Pancreas: Moderate pancreatic atrophy.  Spleen: Normal  Adrenals/Urinary Tract: Left adrenal myelolipoma measures 1.7 cm. Normal right adrenal gland. Mild renal cortical thinning bilaterally. Favor left renal vascular calcifications on image 39 of series 2. No definite left and no right sided renal calculi. Mild nonspecific perinephric interstitial thickening, increased. No hydroureter  Degraded evaluation of the pelvis, secondary to beam hardening artifact from right hip arthroplasty. Given this factor, no bladder stones identified.  Stomach/Bowel: Normal stomach, without wall thickening. Extensive colonic diverticulosis. Muscular hypertrophy involving the sigmoid. Edema adjacent the sigmoid is resolved. Normal terminal ileum and appendix. Normal small bowel caliber.  Vascular/Lymphatic: Advanced aortic and branch vessel atherosclerosis. No abdominopelvic adenopathy.  Reproductive: Hysterectomy.  No adnexal mass.  Other: New presacral edema. New small volume abdominal ascites. Significantly increased anasarca.  Musculoskeletal: Moderate to marked left hip osteoarthritis. Right hip arthroplasty. Osteopenia. Trace L4-5 anterolisthesis.  IMPRESSION: 1. Left renal calcifications are favored to be vascular. No convincing evidence of urinary tract calculi and no evidence of hydronephrosis. 2. Degraded evaluation of the pelvis, secondary to beam hardening artifact from right hip arthroplasty. 3. New bilateral pleural effusions, ascites, and anasarca, suggesting fluid overload. 4. Bibasilar airspace  opacities. Favor atelectasis, especially on the left. Left base pneumonia cannot be excluded.  5. Hyper attenuating liver could relate to iron deposition or prior amiodarone usage. 6. Left adrenal myelolipoma. 7. Advanced atherosclerosis. 8. Progressive perirenal interstitial thickening is nonspecific and could relate to fluid overload. Pyelonephritis could look similar.   Electronically Signed   By: Jeronimo Greaves M.D.   On: 10/20/2014 09:40    EKG:   Orders placed or performed during the hospital encounter of 10/17/14  . ED EKG  . ED EKG  . EKG 12-Lead  . EKG 12-Lead  . EKG 12-Lead  . EKG 12-Lead  . EKG 12-Lead  . EKG 12-Lead    ASSESSMENT AND PLAN:   Elayne Gruver is a 81 y.o. female with a known history of hypertension, insulin-dependent diabetes mellitus, coronary artery disease status post bypass graft surgery, paroxysmal atrial fibrillation on eliquis, history of recurrent ESBL Escherichia coli UTI, was recently treated with Invanz up until 3 days ago using a PICC line at home, presents to the hospital secondary to worsening cough and difficulty breathing and also chest pain. On the floor- became hypotensive and hypoxic and moved to ICU on admission.  #1 Community acquired Pneumonia- blood cultures negative so far - Being continued on meropenem (due to known ESBL E.coli in urine) and azithromycin for 5 days recommended by Dr. Sampson Goon - neb treatments, o2 support- on chronic home o2 of 2L at nights  #2 ARF on CKD stage3- baseline cr around 1.7, now at 1.82 -Continue low-dose torsemide due to  pleural effusions, anasarca, ascites - likely ATN from hypotension on admission. Monitor - avoid nephrotoxins.   #3 . Acute on chronic systolic congestive heart failure-was hypotensive on admission, so diuretics held. Resumed torsemide now due to pleural effusions, anasarca and ascites, may benefit from spironolactone as well as long as patient's potassium level remains stable. Restarted low-dose torsemide.  #4 Recurrent ESBL E.coli, negative urinary cultures of present has a  picc line - Just finished Invanz on 10/11/14 - Repeat  urine cultures revealed only Candida, which is likely colonization. Dr. Orlinda Blalock recommends to follow up with [facility] for further recommendations, urology, however, feels that patient should continue cranberry tablets, but no antibiotic therapy as long as urinary cultures are negative, bladder  Scan shows retention, which is relieved with pressure/voiding`   #5 CAD with elevated troponin-No chest pain now - Elevated troponin in light of sepsis, hypoxia - likely demand ischemia, - no EKG changes acutely, monitor - cont cardiac meds, brady - Monitor on tele - cards consulted- patient follows with Dr. Kirke Corin  #6 Paroxysmal Afib- rate controlled,  - bradycardic- holding coreg - Continue amiodarone, heart rate is well controlled - hold eliquis anticoagulation due to anemia for now -. Since patient's hemoglobin is stable and greater than 8 tomorrow, we are going to start patient's eliquis home dose.  #7 Anemia- acute on chronic - baseline seems to be greater than 8, dropped to 7.7-with hypotension - received 1 unit and improved Hb -- no active bleeding or melena or hematemesis -occult blood is negative. Restart Eliquis  #8 DM- cont home meds  #9 DVT Prophylaxis- TEDS and SCDs  #10. Confusion, unclear etiology at this time. Getting CT scan of the head,  unlikely hepatic encephalopathy as ammonia level is normal, initiate patient on Haldol as needed, suspect underlying dementia worsened by current acute illness  Wheelchair bound at baseline. Physical Therapy consulted  All the records are reviewed and case discussed  with Care Management/Social Workerr. Management plans discussed with the patient, family and they are in agreement.  CODE STATUS: Full code  TOTAL  TIME SPENT IN TAKING CARE OF THIS PATIENT: 45 minutes.    POSSIBLE D/C tomorrow, DEPENDING ON CLINICAL CONDITION.   Katharina Caper M.D on 10/21/2014 at  1:40 PM  Between 7am to 6pm - Pager - [phone]  After 6pm go to [email or web address] - password [email or web address]  [place] [facility]  Office  [phone]  CC: Primary care physician; Fidel Levy, MD

## 2014-10-22 LAB — CBC
HCT: 34.9 % — ABNORMAL LOW (ref 35.0–47.0)
Hemoglobin: 10.7 g/dL — ABNORMAL LOW (ref 12.0–16.0)
MCH: 23.8 pg — ABNORMAL LOW (ref 26.0–34.0)
MCHC: 30.6 g/dL — AB (ref 32.0–36.0)
MCV: 77.8 fL — ABNORMAL LOW (ref 80.0–100.0)
PLATELETS: 256 10*3/uL (ref 150–440)
RBC: 4.48 MIL/uL (ref 3.80–5.20)
RDW: 20.6 % — AB (ref 11.5–14.5)
WBC: 6.6 10*3/uL (ref 3.6–11.0)

## 2014-10-22 LAB — COMPREHENSIVE METABOLIC PANEL
ALBUMIN: 3.1 g/dL — AB (ref 3.5–5.0)
ALK PHOS: 89 U/L (ref 38–126)
ALT: 20 U/L (ref 14–54)
AST: 38 U/L (ref 15–41)
Anion gap: 9 (ref 5–15)
BILIRUBIN TOTAL: 0.7 mg/dL (ref 0.3–1.2)
BUN: 38 mg/dL — AB (ref 6–20)
CALCIUM: 8.7 mg/dL — AB (ref 8.9–10.3)
CO2: 29 mmol/L (ref 22–32)
CREATININE: 1.81 mg/dL — AB (ref 0.44–1.00)
Chloride: 102 mmol/L (ref 101–111)
GFR calc Af Amer: 29 mL/min — ABNORMAL LOW (ref >60)
GFR calc non Af Amer: 25 mL/min — ABNORMAL LOW (ref >60)
GLUCOSE: 91 mg/dL (ref 65–99)
Potassium: 4.4 mmol/L (ref 3.5–5.1)
Sodium: 140 mmol/L (ref 135–145)
Total Protein: 6 g/dL — ABNORMAL LOW (ref 6.5–8.1)

## 2014-10-22 LAB — CULTURE, BLOOD (ROUTINE X 2)
CULTURE: NO GROWTH
CULTURE: NO GROWTH

## 2014-10-22 LAB — GLUCOSE, CAPILLARY
GLUCOSE-CAPILLARY: 108 mg/dL — AB (ref 65–99)
GLUCOSE-CAPILLARY: 63 mg/dL — AB (ref 65–99)
GLUCOSE-CAPILLARY: 84 mg/dL (ref 65–99)
Glucose-Capillary: 125 mg/dL — ABNORMAL HIGH (ref 65–99)
Glucose-Capillary: 113 mg/dL — ABNORMAL HIGH (ref 65–99)

## 2014-10-22 NOTE — Progress Notes (Signed)
Assessment completed.  Pt alert this am, some disorientation, reoriented to place and time.  No distress on ra.  Denies pain or discomfort at this time.  Remains on isolation for ESBL.  CB in reach, SR up x 3, bed alarm on.

## 2014-10-22 NOTE — Plan of Care (Signed)
Problem: Phase I Progression Outcomes Goal: Consider Infectious Disease Consult Outcome: Completed/Met Date Met:  10/22/14 ordered

## 2014-10-22 NOTE — Progress Notes (Signed)
[facility] - [place] at [facility]   PATIENT NAME: [name]    MR#:  [id number]  DATE OF BIRTH:  1931/10/26  SUBJECTIVE:  CHIEF COMPLAINT:   Chief Complaint  Patient presents with  . Shortness of Breath   Admitted for shortness of breath, was noted to have pyuria and admitted for possible recurrence urinary tract infection, ESBL Escherichia coli, chest x-ray revealed left base pneumonia,  initiated on meropenem, now also on Zithromax due to community-acquired pneumonia, being followed by Dr. Sampson Goon who recommends urology follow-up for recurrent UTIs. Patient became confused and now very somnolent today. Not able to review systems. CT of head was unremarkable as well as a morning level.   REVIEW OF SYSTEMS:  Review of Systems  Unable to perform ROS: medical condition  Constitutional: Negative for fever and chills.  HENT: Negative for ear discharge, hearing loss and tinnitus.   Eyes: Negative for blurred vision and double vision.  Respiratory: Positive for cough and shortness of breath. Negative for wheezing.   Cardiovascular: Negative for chest pain and palpitations.  Gastrointestinal: Positive for heartburn, nausea and abdominal pain. Negative for vomiting, diarrhea and constipation.  Genitourinary: Negative for dysuria.  Neurological: Negative for dizziness, tingling, sensory change, speech change, seizures and headaches.  Psychiatric/Behavioral: Negative for depression. The patient is not nervous/anxious.     DRUG ALLERGIES:   Allergies  Allergen Reactions  . Contrast Media [Iodinated Diagnostic Agents] Shortness Of Breath  . Tramadol Nausea Only  . Valium [Diazepam] Other (See Comments)    Reaction:  Elevated heart rate  . Iodine Strong [Iodine] Rash  . Penicillins Rash    VITALS:  Blood pressure 133/70, pulse 67, temperature 98.1 F (36.7 C), temperature source Oral, resp. rate 15, height 5\' 1"  (1.549 m), weight 70.897 kg (156 lb  4.8 oz), SpO2 95 %.  PHYSICAL EXAMINATION:  Physical Exam  GENERAL: 81 y.o.-year-old patient lying in the bed with no acute distress. Somnolent, barely able to open her eyes and mumble by herself, not awake enough to verbalize her concerns  EYES: Pupils equal, round, reactive to light and accommodation. Post surgical pupils. No scleral icterus. Extraocular muscles intact.  HEENT: Head atraumatic, normocephalic. Oropharynx and nasopharynx clear.  NECK: Supple, no jugular venous distention. No thyroid enlargement, no tenderness.  LUNGS: Somewhat diminished breath sounds bilaterally, no wheezing, rales,rhonchi or crepitation, poor respiratory effort. No use of accessory muscles of respiration. [name] CARDIOVASCULAR: S1, S2 normal. No rubs, or gallops. 3/6 systolic murmur heard ABDOMEN: Soft, nontender, nondistended. Bowel sounds present. No organomegaly or mass.  EXTREMITIES: No cyanosis, or clubbing. 2+ pedal edema of right foot and left leg is more swollen in general- which is chronic since her veins were removed for CABG NEUROLOGIC: Cranial nerves difficult to evaluate since patient is very somnolent. PSYCHIATRIC: The patient is somnolent and not oriented, although evaluation is limited SKIN: No obvious rash, lesion, or ulcer.   LABORATORY PANEL:   CBC  Recent Labs Lab 10/20/14 0500  WBC 5.6  HGB 9.3*  HCT 30.7*  PLT 235   ------------------------------------------------------------------------------------------------------------------  Chemistries   Recent Labs Lab 10/17/14 0632  10/20/14 0500 10/21/14 0546  NA 141  < > 138  --   K 4.5  < > 4.2  --   CL 100*  < > 101  --   CO2 27  < > 29  --   GLUCOSE 181*  < > 239*  --   BUN 34*  < > 37*  --  CREATININE 1.95*  < > 2.07* 1.82*  CALCIUM 9.0  < > 8.1*  --   AST 91*  --   --   --   ALT 26  --   --   --   ALKPHOS 101  --   --   --   BILITOT 1.2  --   --   --   < > = values in this interval not  displayed. ------------------------------------------------------------------------------------------------------------------  Cardiac Enzymes  Recent Labs Lab 10/18/14 1037  TROPONINI 0.24*   ------------------------------------------------------------------------------------------------------------------  RADIOLOGY:  Ct Head Wo Contrast  10/21/2014   CLINICAL DATA:  Increased confusion and hallucinations.  EXAM: CT HEAD WITHOUT CONTRAST  TECHNIQUE: Contiguous axial images were obtained from the base of the skull through the vertex without intravenous contrast.  COMPARISON:  None.  FINDINGS: There is no evidence of mass effect, midline shift, or extra-axial fluid collections. There is no evidence of a space-occupying lesion or intracranial hemorrhage. There is no evidence of a cortical-based area of acute infarction. There is generalized cerebral atrophy. There is periventricular white matter low attenuation likely secondary to microangiopathy.  The ventricles and sulci are appropriate for the patient's age. The basal cisterns are patent.  Visualized portions of the orbits are unremarkable. The visualized portions of the paranasal sinuses and mastoid air cells are unremarkable. Cerebrovascular atherosclerotic calcifications are noted.  The osseous structures are unremarkable.  IMPRESSION: 1. No acute intracranial pathology. 2. Chronic microvascular disease and cerebral atrophy.   Electronically Signed   By: Elige Ko   On: 10/21/2014 09:26    EKG:   Orders placed or performed during the hospital encounter of 10/17/14  . ED EKG  . ED EKG  . EKG 12-Lead  . EKG 12-Lead  . EKG 12-Lead  . EKG 12-Lead  . EKG 12-Lead  . EKG 12-Lead    ASSESSMENT AND PLAN:   [name] is a 81 y.o. female with a known history of hypertension, insulin-dependent diabetes mellitus, coronary artery disease status post bypass graft surgery, paroxysmal atrial fibrillation on eliquis, history of recurrent  ESBL Escherichia coli UTI, was recently treated with Invanz up until 3 days ago using a PICC line at home, presents to the hospital secondary to worsening cough and difficulty breathing and also chest pain. On the floor- became hypotensive and hypoxic and moved to ICU on admission.  #1 Community acquired Pneumonia- blood cultures negative so far - Being continued on meropenem (due to known ESBL E.coli in urine) and azithromycin for 5 days as recommended by Dr. Sampson Goon,  neb treatments, o2 support- on chronic home o2 of 2L at nights  #2 ARF on CKD stage3- baseline cr around 1.7, most recent is at  1.82, repeating labs today since patient is obtunded -Continue low-dose torsemide due to  pleural effusions, anasarca, ascites - likely ATN from hypotension on admission.  - avoid nephrotoxins.   #3 . Acute on chronic systolic congestive heart failure-was hypotensive on admission, so diuretics held. Resumed torsemide due to pleural effusions, anasarca and ascites, may benefit from spironolactone as well as long as patient's potassium level remain stable. Restarted low-dose torsemide. Following kidney function. Patient's oxygenation remains relatively stable on 2 L of oxygen through nasal cannula at 94-95%  #4 Recurrent ESBL E.coli, negative urinary cultures of present has a picc line - Just finished Invanz on 10/11/14 - Repeat  urine cultures revealed only Candida, which is likely colonization. Dr. Sampson Goon recommends to follow up with [facility] for further  recommendations, urology, however, feels that patient should continue cranberry tablets, but no antibiotic therapy as long as urinary cultures are negative, bladder  Scan shows retention, which is relieved with pressure/voiding`   #5 CAD with elevated troponin-No chest pain now - Elevated troponin in light of sepsis, hypoxia - likely demand ischemia, - no EKG changes acutely, monitor - cont cardiac meds, brady - Monitor on  tele - cards consulted- patient follows with Dr. Kirke Corin  #6 Paroxysmal Afib- rate controlled,  - bradycardic- holding coreg - Continue amiodarone, heart rate is well controlled - hold eliquis anticoagulation due to anemia for now -. Since patient's hemoglobin is stable and greater than 8 , restarted eliquis home dose. Following hemoglobin level in the morning.   #7 Anemia- acute on chronic - baseline seems to be greater than 8, dropped to 7.7-with hypotension - received 1 unit and improved Hb -- no active bleeding or melena or hematemesis -occult blood is negative. Restarting Eliquis  #8 DM- cont home meds  #9 DVT Prophylaxis- TEDS and SCDs  #10. Altered mental status with Confusion noted few days ago, unclear etiology at this time. CT scan of the head was unremarkable, ammonia level is normal, patient was started on Haldol as needed, due to suspect underlying dementia worsened by current acute illness, but now she is very somnolent and difficult to arouse. We will be repeating all labs including ABGs. May need to get MRI of the brain if  all other studies are negative.   Wheelchair bound at baseline. Physical Therapy consulted  All the records are reviewed and case discussed with Care Management/Social Workerr. Management plans discussed with the patient, family and they are in agreement.  CODE STATUS: Full code  TOTAL  CRITICAL CARE TIME SPENT IN TAKING CARE OF THIS PATIENT: 45 minutes.    Discussed with pharmacy and nursing staff.   Katharina Caper M.D on 10/22/2014 at 1:36 PM  Between 7am to 6pm - Pager - [phone]  After 6pm go to [email or web address] - password [email or web address] [facility]  [place] [facility]  Office  [phone]  CC: Primary care physician; Fidel Levy, MD

## 2014-10-22 NOTE — Progress Notes (Signed)
Dr. Winona Legato made aware of recent lab results, no new orders received.

## 2014-10-22 NOTE — Progress Notes (Signed)
Dr. Winona Legato in to see pt, orders received to run some lab tests.

## 2014-10-23 LAB — BLOOD GAS, ARTERIAL
ACID-BASE EXCESS: 4.9 mmol/L — AB (ref 0.0–3.0)
Bicarbonate: 29.2 mEq/L — ABNORMAL HIGH (ref 21.0–28.0)
FIO2: 0.28
O2 Saturation: 99 %
PCO2 ART: 41 mmHg (ref 32.0–48.0)
PH ART: 7.46 — AB (ref 7.350–7.450)
Patient temperature: 37
pO2, Arterial: 126 mmHg — ABNORMAL HIGH (ref 83.0–108.0)

## 2014-10-23 LAB — GLUCOSE, CAPILLARY
GLUCOSE-CAPILLARY: 161 mg/dL — AB (ref 65–99)
GLUCOSE-CAPILLARY: 190 mg/dL — AB (ref 65–99)
Glucose-Capillary: 138 mg/dL — ABNORMAL HIGH (ref 65–99)
Glucose-Capillary: 165 mg/dL — ABNORMAL HIGH (ref 65–99)

## 2014-10-23 MED ORDER — HALOPERIDOL LACTATE 5 MG/ML IJ SOLN: 1.0000 mg | Freq: Four times a day (QID) | INTRAMUSCULAR | Status: DC | PRN

## 2014-10-23 MED ORDER — RISAQUAD PO CAPS
1.0000 | ORAL_CAPSULE | Freq: Two times a day (BID) | ORAL | Status: DC
  Administered 2014-10-23 – 2014-10-24 (×3): 1 via ORAL
  Filled 2014-10-23 (×3): qty 1

## 2014-10-23 NOTE — Care Management Important Message (Signed)
Important Message  Patient Details  Name: [name] MRN: [id number] Date of Birth: 25-Sep-1931   Medicare Important Message Given:  Yes-second notification given    Eber Hong, RN 10/23/2014, 8:26 AM

## 2014-10-23 NOTE — Clinical Social Work Note (Signed)
CSW spoke to pt's granddaughter. Per granddaughter if [facility] cannot accept to then [facility] would be the next choice.

## 2014-10-23 NOTE — Progress Notes (Signed)
[facility] [place] at [facility]   PATIENT NAME: [name]    MR#:  [id number]  DATE OF BIRTH:  [birth year]  SUBJECTIVE:  CHIEF COMPLAINT:   Chief Complaint  Patient presents with  . Shortness of Breath   Admitted for shortness of breath, was noted to have pyuria and admitted for possible recurrence urinary tract infection, ESBL Escherichia coli, chest x-ray revealed left base pneumonia,  initiated on meropenem, now also on Zithromax due to community-acquired pneumonia, being followed by Dr. Sampson Goon who recommends urology follow-up for recurrent UTIs. Patient became confused and now very somnolent today. Not able to review systems. CT of head was unremarkable as well as a morning level.  Patient feels much better today. She is much more alert and she is not confused.  REVIEW OF SYSTEMS:  Review of Systems  Unable to perform ROS: medical condition  Constitutional: Negative for fever and chills.  HENT: Negative for ear discharge, hearing loss and tinnitus.   Eyes: Negative for blurred vision and double vision.  Respiratory: Positive for cough and shortness of breath. Negative for wheezing.   Cardiovascular: Negative for chest pain and palpitations.  Gastrointestinal: Positive for heartburn, nausea and abdominal pain. Negative for vomiting, diarrhea and constipation.  Genitourinary: Negative for dysuria.  Neurological: Negative for dizziness, tingling, sensory change, speech change, seizures and headaches.  Psychiatric/Behavioral: Negative for depression. The patient is not nervous/anxious.     DRUG ALLERGIES:   Allergies  Allergen Reactions  . Contrast Media [Iodinated Diagnostic Agents] Shortness Of Breath  . Tramadol Nausea Only  . Valium [Diazepam] Other (See Comments)    Reaction:  Elevated heart rate  . Iodine Strong [Iodine] Rash  . Penicillins Rash    VITALS:  Blood pressure 116/50, pulse 76, temperature 98.1 F (36.7 C),  temperature source Oral, resp. rate 16, height 5\' 1"  (1.549 m), weight 70.897 kg (156 lb 4.8 oz), SpO2 99 %.  PHYSICAL EXAMINATION:  Physical Exam  GENERAL: 81 y.o.-year-old patient lying in the bed with no acute distress. Alert, comfortable, converses but easily. Not confused EYES: Pupils equal, round, reactive to light and accommodation. Post surgical pupils. No scleral icterus. Extraocular muscles intact.  HEENT: Head atraumatic, normocephalic. Oropharynx and nasopharynx clear.  NECK: Supple, no jugular venous distention. No thyroid enlargement, no tenderness.  LUNGS: Somewhat diminished breath sounds bilaterally, no wheezing, rales,rhonchi or crepitation, poor respiratory effort. No use of accessory muscles of respiration. [name] CARDIOVASCULAR: S1, S2 normal. No rubs, or gallops. 3/6 systolic murmur heard ABDOMEN: Soft, nontender, nondistended. Bowel sounds present. No organomegaly or mass.  EXTREMITIES: No cyanosis, or clubbing. 2+ pedal edema of right foot and left leg is more swollen in general- which is chronic since her veins were removed for CABG NEUROLOGIC: Cranial nerves difficult to evaluate since patient is very somnolent. PSYCHIATRIC: The patient is alert and oriented 3 SKIN: No obvious rash, lesion, or ulcer.   LABORATORY PANEL:   CBC  Recent Labs Lab 10/22/14 1446  WBC 6.6  HGB 10.7*  HCT 34.9*  PLT 256   ------------------------------------------------------------------------------------------------------------------  Chemistries   Recent Labs Lab 10/22/14 1446  NA 140  K 4.4  CL 102  CO2 29  GLUCOSE 91  BUN 38*  CREATININE 1.81*  CALCIUM 8.7*  AST 38  ALT 20  ALKPHOS 89  BILITOT 0.7   ------------------------------------------------------------------------------------------------------------------  Cardiac Enzymes  Recent Labs Lab 10/18/14 1037  TROPONINI 0.24*    ------------------------------------------------------------------------------------------------------------------  RADIOLOGY:  No results found.  EKG:  Orders placed or performed during the hospital encounter of 10/17/14  . ED EKG  . ED EKG  . EKG 12-Lead  . EKG 12-Lead  . EKG 12-Lead  . EKG 12-Lead  . EKG 12-Lead  . EKG 12-Lead    ASSESSMENT AND PLAN:   [name] is a 81 y.o. female with a known history of hypertension, insulin-dependent diabetes mellitus, coronary artery disease status post bypass graft surgery, paroxysmal atrial fibrillation on eliquis, history of recurrent ESBL Escherichia coli UTI, was recently treated with Invanz up until 3 days ago using a PICC line at home, presents to the hospital secondary to worsening cough and difficulty breathing and also chest pain. On the floor- became hypotensive and hypoxic and moved to ICU on admission.  #1 Community acquired Pneumonia- blood cultures negative so far - Discontinue meropenem and finished azithromycin course, as cultures with Dr. Sampson Goon. Blood cultures were negative 2. Urine culture is Candida albicans  #2 ARF on CKD stage3- baseline cr around 1.7, most recent is at  1.81, repeating labs tomorrow, since patient is on  low-dose torsemide due to  pleural effusions, anasarca, ascites - likely ATN from hypotension on admission.  - avoid nephrotoxins.   #3 . Acute on chronic systolic congestive heart failure-was hypotensive on admission, so diuretics held. Resumed torsemide due to pleural effusions, anasarca and ascites, may benefit from spironolactone as well as long as patient's potassium level remain stable. Restarted low-dose torsemide. Following kidney function tomorrow morning. Patient's oxygenation remains relatively stable on 2 L of oxygen through nasal cannula at 94-95%, weaning patient off oxygen  #4 Recurrent ESBL E.coli, negative urinary cultures of present has a picc line - Just finished  Invanz on 10/11/14 - Repeat  urine cultures revealed only Candida, which is likely colonization. Discussed with . Dr. Sampson Goon via text messaging, he recommends to discontinue meropenem and follow up with [facility] for further recommendations, urology, however, feels that patient should continue cranberry tablets, also probiotic, follow-up with them for urologic studies, possibly as cystoscopy, also double voiding practice, scheduled voiding,  but no antibiotic therapy as long as urinary cultures are negative, bladder  Scan shows retention, which is relieved with pressure/voiding`   #5 CAD with elevated troponin-No chest pain now - Elevated troponin in light of sepsis, hypoxia - likely demand ischemia, - no EKG changes acutely, monitor - cont cardiac meds, brady - Monitor on tele - cards consulted- patient follows with Dr. Kirke Corin  #6 Paroxysmal Afib- rate controlled,  - was bradycardic- holding coreg, may restart upon discharge at lower dose - Continue amiodarone, heart rate is well controlled - Resumed eliquis anticoagulation -. Since patient's hemoglobin is stable and greater than 8 , restarted eliquis home dose. Following hemoglobin level in the morning.   #7 Anemia- acute on chronic - baseline seems to be greater than 8, dropped to 7.7-with hypotension - received 1 unit and improved Hb -- no active bleeding or melena or hematemesis -occult blood is negative. Restarted Eliquis, no bleeding noted. Checking hemoglobin level tomorrow morning  #8 DM- cont home meds  #9 DVT Prophylaxis- TEDS and SCDs  #10. Altered mental status with Confusion noted few days ago, unclear etiology . CT scan of the head was unremarkable, ammonia level is normal, patient was started on Haldol as needed, due to suspected underlying dementia worsened by current acute illness, she has condition significantly improved and I suspect it was related to hospitalization itself,  now since patient  rested.,  He her  mental status has improved. Follow up in the morning and possibly discharged to skilled nursing facility tomorrow   Wheelchair bound at baseline. Skilled nursing facility placement, likely tomorrow. Discussed with care management today about discharge planning  All the records are reviewed and case discussed with Care Management/Social Workerr. Management plans discussed with the patient, family and they are in agreement.  CODE STATUS: Full code  TOTAL  TIME SPENT IN TAKING CARE OF THIS PATIENT: 45 minutes.    Discussed with care management and nursing staff. Discussed with Dr. Sampson Goon via Text messaging   Berk Pilot M.D on 10/23/2014 at 2:55 PM  Between 7am to 6pm - Pager - [phone]  After 6pm go to [email or web address] - password [email or web address] [facility]  [facility]  Office  [phone]  CC: Primary care physician; Fidel Levy, MD

## 2014-10-24 DIAGNOSIS — R410 Disorientation, unspecified: Secondary | ICD-10-CM

## 2014-10-24 DIAGNOSIS — N170 Acute kidney failure with tubular necrosis: Secondary | ICD-10-CM

## 2014-10-24 DIAGNOSIS — M25559 Pain in unspecified hip: Secondary | ICD-10-CM

## 2014-10-24 DIAGNOSIS — I5023 Acute on chronic systolic (congestive) heart failure: Secondary | ICD-10-CM

## 2014-10-24 LAB — GLUCOSE, CAPILLARY
GLUCOSE-CAPILLARY: 229 mg/dL — AB (ref 65–99)
Glucose-Capillary: 142 mg/dL — ABNORMAL HIGH (ref 65–99)
Glucose-Capillary: 151 mg/dL — ABNORMAL HIGH (ref 65–99)

## 2014-10-24 MED ORDER — CRANBERRY 1000 MG PO CAPS: 1.0000 | ORAL_CAPSULE | Freq: Every day | ORAL | Status: DC

## 2014-10-24 MED ORDER — ENSURE ENLIVE PO LIQD
237.0000 mL | Freq: Two times a day (BID) | ORAL | Status: DC
  Administered 2014-10-24: 237 mL via ORAL

## 2014-10-24 MED ORDER — RISAQUAD PO CAPS: 1.0000 | ORAL_CAPSULE | Freq: Two times a day (BID) | ORAL | Status: AC

## 2014-10-24 NOTE — Progress Notes (Addendum)
Patient is discharge home in a stable condition no distress noted at time of discharge, o2 sat on exertion between 93% - 95% with Nun in   case management at bedside during checking of pt 02 sat, summary and f/u care given to both pt's and grand-daughter , verbalized understanding , left with grand-daughter

## 2014-10-24 NOTE — Progress Notes (Signed)
Nutrition Follow-up  INTERVENTION:   Meals and Snacks: Cater to patient preferences Medical Food Supplement Therapy: recommend changing supplement to Ensure Enlive, each supplement provides 350 kcal and 20 grams of protein, recommend BID   NUTRITION DIAGNOSIS:   Inadequate oral intake related to acute illness as evidenced by NPO status. Being addressed as diet advanced, changing supplement  GOAL:   Patient will meet greater than or equal to 90% of their needs  MONITOR:    (Energy intake, Glucose profile, Electrolyte and renal profile)  REASON FOR ASSESSMENT:   Malnutrition Screening Tool    ASSESSMENT:     Diet Order:  Diet 2 gram sodium Room service appropriate?: Yes; Fluid consistency:: Thin   Energy Intake: recorded po intake 74% of meals, pt not drinking much of Boost Breeze  Electrolyte and Renal Profile:  Recent Labs Lab 10/19/14 0419 10/20/14 0500 10/21/14 0546 10/22/14 1446  BUN 35* 37*  --  38*  CREATININE 1.98* 2.07* 1.82* 1.81*  NA 140 138  --  140  K 4.1 4.2  --  4.4   Glucose Profile:  Recent Labs  10/23/14 1941 10/24/14 0806 10/24/14 1138  GLUCAP 190* 142* 151*   Protein Profile:  Recent Labs Lab 10/22/14 1446  ALBUMIN 3.1*   Meds: ss novolog, lantus  Height:   Ht Readings from Last 1 Encounters:  10/17/14  (1.549 m)    Weight:   Wt Readings from Last 1 Encounters:  10/19/14 156 lb 4.8 oz (70.897 kg)    BMI:  Body mass index is 29.55 kg/(m^2).  Estimated Nutritional Needs:   Kcal:  Using IBW of 48kg (BEE 882 kcals (IF 1.0-1.3, aF 1.3) 8469-6295 kcals/d  Protein:  Using IBW of 48kg (1.1-1.3 g/kg) 53-62 g/d  Fluid:  Using IBW of 48kg (1440-1671ml/d)  EDUCATION NEEDS:   No education needs identified at this time  LOW Care Level  Romelle Starcher MS, RD, LDN [phone] Pager

## 2014-10-24 NOTE — Clinical Social Work Note (Signed)
CSW spoke to pt.  She was alert and oriented x4 .  She confirmed that she did not want to go to a SNF for DC.  RNCM is aware.  CSW signing off.

## 2014-10-24 NOTE — Progress Notes (Signed)
[facility] CLINIC INFECTIOUS DISEASE PROGRESS NOTE Date of Admission:  10/17/2014     ID: [name] is a 81 y.o. female with  pna and recurrent UTI  Principal Problem:   Pneumonia Active Problems:   Acute respiratory failure with hypoxia   Elevated troponin   Recurrent UTI   Acute renal failure due to tubular necrosis   Acute on chronic systolic CHF (congestive heart failure)   Acute delirium   Hip pain   Subjective: Seen by urology. Feels stronger. No fevers  ROS  Eleven systems are reviewed and negative except per hpi  Medications:  Antibiotics Given (last 72 hours)    Date/Time Action Medication Dose Rate   10/21/14 2126 Given   meropenem (MERREM) 500 mg in sodium chloride 0.9 % 50 mL IVPB 500 mg 100 mL/hr   10/22/14 0907 Given   meropenem (MERREM) 500 mg in sodium chloride 0.9 % 50 mL IVPB 500 mg 100 mL/hr   10/22/14 2132 Given   meropenem (MERREM) 500 mg in sodium chloride 0.9 % 50 mL IVPB 500 mg 100 mL/hr   10/23/14 0957 Given   meropenem (MERREM) 500 mg in sodium chloride 0.9 % 50 mL IVPB 500 mg 100 mL/hr     . acidophilus  1 capsule Oral BID  . amiodarone  200 mg Oral Daily  . apixaban  2.5 mg Oral BID  . feeding supplement (ENSURE ENLIVE)  237 mL Oral BID WC  . gabapentin  400 mg Oral TID  . insulin aspart  0-5 Units Subcutaneous QHS  . insulin aspart  0-9 Units Subcutaneous TID WC  . insulin glargine  15 Units Subcutaneous Daily  . levothyroxine  100 mcg Oral QAC breakfast  . nystatin cream  1 application Topical BID  . pantoprazole  40 mg Oral Daily  . pravastatin  40 mg Oral Daily  . sodium chloride  10-40 mL Intracatheter Q12H  . sodium chloride  3 mL Intravenous Q12H  . torsemide  20 mg Oral Daily    Objective: Vital signs in last 24 hours: Temp:  [98.2 F (36.8 C)-98.4 F (36.9 C)] 98.3 F (36.8 C) (09/06 1136) Pulse Rate:  [67-73] 73 (09/06 1136) Resp:  [16-20] 20 (09/06 1136) BP: (114-132)/(55-61) 127/55 mmHg (09/06 1136) SpO2:  [96  %-99 %] 99 % (09/06 1136) Constitutional: oriented to person, place, and time. Frail HENT: [place]/AT, PERRLA, no scleral icterus Mouth/Throat: Oropharynx is clear and moist. No oropharyngeal exudate.  Cardiovascular: Normal rate, regular rhythm and normal heart sounds.  Pulmonary/Chest: rhonchi bil Neck = supple, no nuchal rigidity Abdominal: Soft. Bowel sounds are normal. exhibits no distension. There is no tenderness.  Lymphadenopathy: no cervical adenopathy. No axillary adenopathy Neurological: alert and oriented to person, place, and time.  Skin: Skin is warm and dry. No rash noted. No erythema.  Psychiatric: a normal mood and affect. behavior is normal.  PICC RUE  Lab Results  Recent Labs  10/22/14 1446  WBC 6.6  HGB 10.7*  HCT 34.9*  NA 140  K 4.4  CL 102  CO2 29  BUN 38*  CREATININE 1.81*    Microbiology: Results for orders placed or performed during the hospital encounter of 10/17/14  Blood culture (routine x 2)     Status: None   Collection Time: 10/17/14  9:07 AM  Result Value Ref Range Status   Specimen Description BLOOD LEFT ASSIST CONTROL  Final   Special Requests BOTTLES DRAWN AEROBIC AND ANAEROBIC  1 CC  Final  Culture NO GROWTH 5 DAYS  Final   Report Status 10/22/2014 FINAL  Final  Blood culture (routine x 2)     Status: None   Collection Time: 10/17/14  9:07 AM  Result Value Ref Range Status   Specimen Description BLOOD RIGHT ASSIST CONTROL  Final   Special Requests BOTTLES DRAWN AEROBIC AND ANAEROBIC  1 CC  Final   Culture NO GROWTH 5 DAYS  Final   Report Status 10/22/2014 FINAL  Final  Urine culture     Status: None   Collection Time: 10/17/14  5:51 PM  Result Value Ref Range Status   Specimen Description URINE, CLEAN CATCH  Final   Special Requests NONE  Final   Culture 20,000 COLONIES/mL CANDIDA ALBICANS  Final   Report Status 10/20/2014 FINAL  Final  MRSA PCR Screening     Status: None   Collection Time: 10/17/14  5:51 PM  Result Value  Ref Range Status   MRSA by PCR NEGATIVE NEGATIVE Final    Comment:        The GeneXpert MRSA Assay (FDA approved for NASAL specimens only), is one component of a comprehensive MRSA colonization surveillance program. It is not intended to diagnose MRSA infection nor to guide or monitor treatment for MRSA infections.     Studies/Results: No results found.  Assessment/Plan: [name] is a 81 y.o. female with recurrent ESBL E coli UTI now s.p 2 course of IV ertapenem admitted with cough, sob, fever and cxr evidence of PNA. Just recently finished IV ertapenem. Her UA was floridly positive with TNTC WBC and WBC clumps. However cx is only growing yeast. SHe has been followed by Dr Apolinar Junes in urology.  Last renal imagine in April showed no stones, hydro. She does not recall any history of a cystoscopy.  PVR seems elevated at 338.  CT shows what are prob vascular calcifications but no definitive evidence of stones.   Has been seen by urology - recs reviewed Recommendations Ok to stop abx and monitor Continue recs per Urology Will need cysto as otpt Thank you very much for the consult. Will follow with you.  Alexavier Tsutsui   10/24/2014, 3:11 PM

## 2014-10-24 NOTE — Discharge Instructions (Signed)
Urinary retentionAcute Urinary Retention Urinary retention means you are unable to pee completely or at all (empty your bladder). HOME CARE  Drink enough fluids to keep your pee (urine) clear or pale yellow.  If you are sent home with a tube that drains the bladder (catheter), there will be a drainage bag attached to it. There are two types of bags. One is big that you can wear at night without having to empty it. One is smaller and needs to be emptied more often.  Keep the drainage bag emptied.  Keep the drainage bag lower than the tube.  Only take medicine as told by your doctor. GET HELP IF:  You have a low-grade fever.  You have spasms or you are leaking pee when you have spasms. GET HELP RIGHT AWAY IF:   You have chills or a fever.  Your catheter stops draining pee.  Your catheter falls out.  You have increased bleeding that does not stop after you have rested and increased the amount of fluids you had been drinking. MAKE SURE YOU:   Understand these instructions.  Will watch your condition.  Will get help right away if you are not doing well or get worse. Document Released: 07/23/2007 Document Revised: 02/08/2013 Document Reviewed: 07/15/2012 [facility] Patient Information 2015 [place], [place]. This information is not intended to replace advice given to you by your health care provider. Make sure you discuss any questions you have with your health care provider.  Please void twice each time every 10 minutes to make sure that to empty your bladder completely!!!

## 2014-10-24 NOTE — Discharge Summary (Signed)
[facility] [place] at [facility]   PATIENT NAME: [name]    MR#:  [id number]  DATE OF BIRTH:  1931-04-17  DATE OF ADMISSION:  10/17/2014 ADMITTING PHYSICIAN: Enid Baas, MD  DATE OF DISCHARGE: No discharge date for patient encounter.  PRIMARY CARE PHYSICIAN: Fidel Levy, MD     ADMISSION DIAGNOSIS:  Healthcare-associated pneumonia [J18.9] NSTEMI (non-ST elevated myocardial infarction) [I21.4] Acute respiratory failure with hypoxia [J96.01]  DISCHARGE DIAGNOSIS:  Principal Problem:   Pneumonia Active Problems:   Acute respiratory failure with hypoxia   Elevated troponin   Acute renal failure due to tubular necrosis   Acute delirium   Recurrent UTI   Acute on chronic systolic CHF (congestive heart failure)   Hip pain   SECONDARY DIAGNOSIS:   Past Medical History  Diagnosis Date  . DM2 (diabetes mellitus, type 2)     onset age 11 insulin dependent  . Hyperlipidemia   . COPD (chronic obstructive pulmonary disease)   . Peripheral vascular disease   . CAD (coronary artery disease)     a. MI 1988 s/p CABG in 1988, multiple PCI on SVGs; b. cath 2012: occluded native coronary arteries, patent LIMA to LAD (distal LAD dz), patent SVG-OM & occluded SVG-RCA which filled via left to right collats; c. cath 12/2013 patent LIMA-LAD & SVG-O. known occluded VG-RCA w/ L-R collats, no change since cath 2012  . Chronic systolic CHF (congestive heart failure)     a. 03/2012: EF 40-45%, mild lateral wall HK, mod inf HK, mod post wall HK, mild LVH, mild MR/TR   . Paroxysmal atrial fibrillation     a. CHADSVASc 7: yearly risk of CVA 9.6%;. b. on Eliquis 2.5 mg bid (age, SCr, borderline wt 62.9 Kg)   . HTN (hypertension)   . Yeast infection   . UTI (urinary tract infection)     Recurrent ESBL E.coli UTI  . Atherosclerosis of native artery of left lower extremity   . Myocardial infarction   . CKD (chronic kidney disease) stage 3, GFR 30-59  ml/min   . Hypoxia     on nocturnal o2    .pro HOSPITAL COURSE:   The patient is a 81 year old Caucasian female with past medical history significant for history of diabetes, hypertension, coronary artery disease, paroxysmal atrial fibrillation, history of ESBL Escherichia coli who just finished Invanz therapy via PICC line, who presented to the hospital with complaints of shortness of breath, cough, difficulty breathing and chest pains. Chest x-ray done 30th of August 2016 revealed pneumonia in left lower lobe. Patient was admitted to the hospital for further evaluation and treatment. She was initiated on meropenem and Zithromax while in the hospital and consultation with Dr. Sampson Goon was obtained.  Patient's blood cultures done on 10/17/2014 did not show any growth. Urine cultures also showed no growth except of 20,000 colony-forming units of Candida albicans,  MRSA by PCR was negative. Patient received IV fluids, antibiotic therapy and unfortunately became fluid overloaded and developed acute on chronic systolic CHF.  Because of hypotension. Initially in the course of the illness patient developed also acute on chronic renal failure, which was felt to be due to ATN . CT scan of abdomen and pelvis revealed left renal calcifications which were favored to be vascular. New bilateral pleural effusions, ascites is as well as anasarca were found suggesting fluid overload. Bibasilar airspace opacities were noted, likely atelectasis, although left base pneumonia could not be excluded.  Progressive perirenal interstitial thickening was  noted, which was felt to be due to fluid overload, however pyelonephritis was also of concern. Patient completed antibiotic therapy in the hospital and clinically improved. When patient's blood pressure could tolerate, she was reinitiated on diuretic therapy. While in the hospital patient was confused intermittently , which was felt to be due to acute delirium due to infection .  With conservative therapy, however  her condition improved and she was ready to be discharged home Discussion by problem  #1 Community acquired Pneumonia-- Completed meropenem and azithromycin course, as discussed with Dr. Sampson Goon. Blood cultures were negative 2. Urine culture is Candida albicans. Discontinue PICC line as discussed with Dr. Sampson Goon  #2 ARF on CKD stage3- baseline cr around 1.7, most recent is at 1.81, repeating labs as outpatient, patient is back on low-dose torsemide due to pleural effusions, anasarca, ascites - likely ATN from hypotension on admission.  - avoid nephrotoxins.   #3 . Acute on chronic systolic congestive heart failure-was hypotensive on admission, so diuretics held. Resumed torsemide due to pleural effusions, anasarca and ascites, may benefit from spironolactone as well as long as patient's potassium level remain stable. Restarted low-dose torsemide. Following kidney function as outpatient. Patient's oxygenation remains relatively stable, now off oxygen , but overall qualified for oxygen therapy at  Daytime.  #4 . History of Recurrent ESBL E.coli, negative urinary cultures of present, discontinue picc line - Just finished Invanz on 10/11/14 - Repeat urine cultures revealed only Candida, which is likely colonization. Discussed with . Patient is to follow-up with [facility] for further recommendations, urology, however, feels that patient should continue cranberry tablets, also probiotic, follow-up with them for urologic studies, possibly as cystoscopy, also double voiding practice, scheduled voiding, but no antibiotic therapy as long as urinary cultures are negative, bladder Scan shows retention, which is relieved with pressure/voiding`   #5 CAD with elevated troponin-No chest pain now - Elevated troponin in light of sepsis, hypoxia - likely demand ischemia, - no EKG changes - cont cardiac meds, brady - Monitor on tele - cards  consulted- patient follows with Dr. Kirke Corin  #6 Paroxysmal Afib- rate controlled,  - was bradycardic- resolved now. Resume coreg - Continue amiodarone, heart rate is well controlled - Resumed eliquis anticoagulation -. Since patient's hemoglobin is stable and greater than 8 , restarted eliquis home dose. Following hemoglobin level as outpatient   #7 Anemia- acute on chronic - baseline seems to be greater than 8, dropped to 7.7-with hypotension - received 1 unit and improved Hb -- no active bleeding or melena or hematemesis -occult blood is negative. Restarted Eliquis, no bleeding noted. Checking hemoglobin level in few days as outpatient  #8 DM- cont home meds  #9 DVT Prophylaxis- TEDS and SCDs  #10. Altered mental status with Confusion noted few days ago, etiology is likely acute delirium . CT scan of the head was unremarkable, ammonia level is normal, patient was started on Haldol as needed, due to suspected underlying dementia worsened by current acute illness, her  condition improved  11 . Hip pain with Wheelchair bound at baseline. We will be discharging to home with home health physical therapy,. RN,  Aide, needs to follow up with orthopedist surgeon for hip surgery  DISCHARGE CONDITIONS:   Stable  CONSULTS OBTAINED:  Treatment Team:  Marykay Lex, MD Clydie Braun, MD Crist Fat, MD  DRUG ALLERGIES:   Allergies  Allergen Reactions  . Contrast Media [Iodinated Diagnostic Agents] Shortness Of Breath  . Tramadol Nausea Only  .  Valium [Diazepam] Other (See Comments)    Reaction:  Elevated heart rate  . Iodine Strong [Iodine] Rash  . Penicillins Rash    DISCHARGE MEDICATIONS:   Current Discharge Medication List    START taking these medications   Details  acidophilus (RISAQUAD) CAPS capsule Take 1 capsule by mouth 2 (two) times daily. Qty: 60 capsule, Refills: 4    Cranberry 1000 MG CAPS Take 1 capsule (1,000 mg total) by mouth daily. Qty: 30 each,  Refills: 12      CONTINUE these medications which have NOT CHANGED   Details  amiodarone (PACERONE) 200 MG tablet Take 1 tablet (200 mg total) by mouth daily. Qty: 90 tablet, Refills: 3    apixaban (ELIQUIS) 2.5 MG TABS tablet Take 1 tablet (2.5 mg total) by mouth 2 (two) times daily. Qty: 60 tablet, Refills: 6    benazepril (LOTENSIN) 20 MG tablet Take 20 mg by mouth daily.    carvedilol (COREG) 3.125 MG tablet Take 1 tablet (3.125 mg total) by mouth 2 (two) times daily. Qty: 180 tablet, Refills: 3    gabapentin (NEURONTIN) 400 MG capsule Take 400 mg by mouth 3 (three) times daily.    HYDROcodone-acetaminophen (NORCO/VICODIN) 5-325 MG per tablet Take 1-2 tablets by mouth 2 (two) times daily as needed for moderate pain. Qty: 30 tablet, Refills: 0   Associated Diagnoses: Chronic left hip pain    insulin lispro (HUMALOG) 100 UNIT/ML injection Inject 0.04 mLs (4 Units total) into the skin 3 (three) times daily before meals. Qty: 10 mL, Refills: 11    isosorbide mononitrate (IMDUR) 60 MG 24 hr tablet Take 90 mg by mouth daily.     LANTUS SOLOSTAR 100 UNIT/ML Solostar Pen Inject 32 Units into the skin at bedtime.     levothyroxine (SYNTHROID, LEVOTHROID) 100 MCG tablet Take 100 mcg by mouth daily.     nitroGLYCERIN (NITROSTAT) 0.4 MG SL tablet Place 1 tablet (0.4 mg total) under the tongue every 5 (five) minutes as needed for chest pain. Qty: 30 tablet, Refills: 0    nystatin cream (MYCOSTATIN) Apply 1 application topically 2 (two) times daily. Qty: 30 g, Refills: 1   Associated Diagnoses: Candidal skin infection    pantoprazole (PROTONIX) 40 MG tablet Take 40 mg by mouth daily.    potassium chloride (K-DUR) 10 MEQ tablet Take 10 mEq by mouth daily.     pravastatin (PRAVACHOL) 40 MG tablet Take 1 tablet (40 mg total) by mouth daily. Qty: 90 tablet, Refills: 3    torsemide (DEMADEX) 20 MG tablet Take 40 mg in the morning and 20 mg in the afternoon. Qty: 180 tablet, Refills: 3     albuterol (PROVENTIL) (2.5 MG/3ML) 0.083% nebulizer solution Take 3 mLs (2.5 mg total) by nebulization every 6 (six) hours as needed for wheezing or shortness of breath. Qty: 150 mL, Refills: 1   Associated Diagnoses: Chronic obstructive pulmonary disease, unspecified COPD, unspecified chronic bronchitis type    docusate sodium (COLACE) 100 MG capsule Take 1 capsule (100 mg total) by mouth 2 (two) times daily. Qty: 60 capsule, Refills: 0    polyethylene glycol (MIRALAX / GLYCOLAX) packet Take 17 g by mouth daily as needed for mild constipation or moderate constipation. Qty: 14 each, Refills: 0      STOP taking these medications     INVANZ 1 G injection          DISCHARGE INSTRUCTIONS:    Follow-up with primary care physician,  orthopedist surgeon , urologist  If you experience worsening of your admission symptoms, develop shortness of breath, life threatening emergency, suicidal or homicidal thoughts you must seek medical attention immediately by calling 911 or calling your MD immediately  if symptoms less severe.  You Must read complete instructions/literature along with all the possible adverse reactions/side effects for all the Medicines you take and that have been prescribed to you. Take any new Medicines after you have completely understood and accept all the possible adverse reactions/side effects.   Please note  You were cared for by a hospitalist during your hospital stay. If you have any questions about your discharge medications or the care you received while you were in the hospital after you are discharged, you can call the unit and asked to speak with the hospitalist on call if the hospitalist that took care of you is not available. Once you are discharged, your primary care physician will handle any further medical issues. Please note that NO REFILLS for any discharge medications will be authorized once you are discharged, as it is imperative that you return to your  primary care physician (or establish a relationship with a primary care physician if you do not have one) for your aftercare needs so that they can reassess your need for medications and monitor your lab values.    Today   CHIEF COMPLAINT:   Chief Complaint  Patient presents with  . Shortness of Breath    HISTORY OF PRESENT ILLNESS:  Mkayla Steele  is a 81 y.o. female with a known history of diabetes, hypertension, coronary artery disease, paroxysmal atrial fibrillation, history of ESBL Escherichia coli who just finished Invanz therapy via PICC line, who presented to the hospital with complaints of shortness of breath, cough, difficulty breathing and chest pains. Chest x-ray done 30th of August 2016 revealed pneumonia in left lower lobe. Patient was admitted to the hospital for further evaluation and treatment. She was initiated on meropenem and Zithromax while in the hospital and consultation with Dr. Sampson Goon was obtained.  Patient's blood cultures done on 10/17/2014 did not show any growth. Urine cultures also showed no growth except of 20,000 colony-forming units of Candida albicans,  MRSA by PCR was negative. Patient received IV fluids, antibiotic therapy and unfortunately became fluid overloaded and developed acute on chronic systolic CHF.  Because of hypotension. Initially in the course of the illness patient developed also acute on chronic renal failure, which was felt to be due to ATN . CT scan of abdomen and pelvis revealed left renal calcifications which were favored to be vascular. New bilateral pleural effusions, ascites is as well as anasarca were found suggesting fluid overload. Bibasilar airspace opacities were noted, likely atelectasis, although left base pneumonia could not be excluded.  Progressive perirenal interstitial thickening was noted, which was felt to be due to fluid overload, however pyelonephritis was also of concern. Patient completed antibiotic therapy in the  hospital and clinically improved. When patient's blood pressure could tolerate, she was reinitiated on diuretic therapy. While in the hospital patient was confused intermittently , which was felt to be due to acute delirium due to infection . With conservative therapy, however  her condition improved and she was ready to be discharged home Discussion by problem  #1 Community acquired Pneumonia-- Completed meropenem and azithromycin course, as discussed with Dr. Sampson Goon. Blood cultures were negative 2. Urine culture is Candida albicans. Discontinue PICC line as discussed with Dr. Sampson Goon  #2 ARF on CKD stage3- baseline cr around 1.7, most recent  is at 1.81, repeating labs as outpatient, patient is back on low-dose torsemide due to pleural effusions, anasarca, ascites - likely ATN from hypotension on admission.  - avoid nephrotoxins.   #3 . Acute on chronic systolic congestive heart failure-was hypotensive on admission, so diuretics held. Resumed torsemide due to pleural effusions, anasarca and ascites, may benefit from spironolactone as well as long as patient's potassium level remain stable. Restarted low-dose torsemide. Following kidney function as outpatient. Patient's oxygenation remains relatively stable, now off oxygen , but overall qualified for oxygen therapy at  Daytime.  #4 . History of Recurrent ESBL E.coli, negative urinary cultures of present, discontinue picc line - Just finished Invanz on 10/11/14 - Repeat urine cultures revealed only Candida, which is likely colonization. Discussed with . Patient is to follow-up with [facility] for further recommendations, urology, however, feels that patient should continue cranberry tablets, also probiotic, follow-up with them for urologic studies, possibly as cystoscopy, also double voiding practice, scheduled voiding, but no antibiotic therapy as long as urinary cultures are negative, bladder Scan shows retention,  which is relieved with pressure/voiding`   #5 CAD with elevated troponin-No chest pain now - Elevated troponin in light of sepsis, hypoxia - likely demand ischemia, - no EKG changes - cont cardiac meds, brady - Monitor on tele - cards consulted- patient follows with Dr. Kirke Corin  #6 Paroxysmal Afib- rate controlled,  - was bradycardic- resolved now. Resume coreg - Continue amiodarone, heart rate is well controlled - Resumed eliquis anticoagulation -. Since patient's hemoglobin is stable and greater than 8 , restarted eliquis home dose. Following hemoglobin level as outpatient   #7 Anemia- acute on chronic - baseline seems to be greater than 8, dropped to 7.7-with hypotension - received 1 unit and improved Hb -- no active bleeding or melena or hematemesis -occult blood is negative. Restarted Eliquis, no bleeding noted. Checking hemoglobin level in few days as outpatient  #8 DM- cont home meds  #9 DVT Prophylaxis- TEDS and SCDs  #10. Altered mental status with Confusion noted few days ago, etiology is likely acute delirium . CT scan of the head was unremarkable, ammonia level is normal, patient was started on Haldol as needed, due to suspected underlying dementia worsened by current acute illness, her  condition improved  11 . Hip pain with Wheelchair bound at baseline. We will be discharging to home with home health physical therapy,. RN,  Aide, needs to follow up with orthopedist surgeon for hip surgery     VITAL SIGNS:  Blood pressure 127/55, pulse 73, temperature 98.3 F (36.8 C), temperature source Oral, resp. rate 20, height 5\' 1"  (1.549 m), weight 70.897 kg (156 lb 4.8 oz), SpO2 99 %.  I/O:   Intake/Output Summary (Last 24 hours) at 10/24/14 1508 Last data filed at 10/24/14 1300  Gross per 24 hour  Intake      0 ml  Output   1475 ml  Net  -1475 ml    PHYSICAL EXAMINATION:  GENERAL:  81 y.o.-year-old patient lying in the bed with no acute distress.  EYES: Pupils  equal, round, reactive to light and accommodation. No scleral icterus. Extraocular muscles intact.  HEENT: Head atraumatic, normocephalic. Oropharynx and nasopharynx clear.  NECK:  Supple, no jugular venous distention. No thyroid enlargement, no tenderness.  LUNGS: Normal breath sounds bilaterally, no wheezing, rales,rhonchi or crepitation. No use of accessory muscles of respiration.  CARDIOVASCULAR: S1, S2 normal. No murmurs, rubs, or gallops.  ABDOMEN: Soft, non-tender, non-distended. Bowel sounds  present. No organomegaly or mass.  EXTREMITIES: No pedal edema, cyanosis, or clubbing.  NEUROLOGIC: Cranial nerves II through XII are intact. Muscle strength 5/5 in all extremities. Sensation intact. Gait not checked.  PSYCHIATRIC: The patient is alert and oriented x 3.  SKIN: No obvious rash, lesion, or ulcer.   DATA REVIEW:   CBC  Recent Labs Lab 10/22/14 1446  WBC 6.6  HGB 10.7*  HCT 34.9*  PLT 256    Chemistries   Recent Labs Lab 10/22/14 1446  NA 140  K 4.4  CL 102  CO2 29  GLUCOSE 91  BUN 38*  CREATININE 1.81*  CALCIUM 8.7*  AST 38  ALT 20  ALKPHOS 89  BILITOT 0.7    Cardiac Enzymes  Recent Labs Lab 10/18/14 1037  TROPONINI 0.24*    Microbiology Results  Results for orders placed or performed during the hospital encounter of 10/17/14  Blood culture (routine x 2)     Status: None   Collection Time: 10/17/14  9:07 AM  Result Value Ref Range Status   Specimen Description BLOOD LEFT ASSIST CONTROL  Final   Special Requests BOTTLES DRAWN AEROBIC AND ANAEROBIC  1 CC  Final   Culture NO GROWTH 5 DAYS  Final   Report Status 10/22/2014 FINAL  Final  Blood culture (routine x 2)     Status: None   Collection Time: 10/17/14  9:07 AM  Result Value Ref Range Status   Specimen Description BLOOD RIGHT ASSIST CONTROL  Final   Special Requests BOTTLES DRAWN AEROBIC AND ANAEROBIC  1 CC  Final   Culture NO GROWTH 5 DAYS  Final   Report Status 10/22/2014 FINAL  Final   Urine culture     Status: None   Collection Time: 10/17/14  5:51 PM  Result Value Ref Range Status   Specimen Description URINE, CLEAN CATCH  Final   Special Requests NONE  Final   Culture 20,000 COLONIES/mL CANDIDA ALBICANS  Final   Report Status 10/20/2014 FINAL  Final  MRSA PCR Screening     Status: None   Collection Time: 10/17/14  5:51 PM  Result Value Ref Range Status   MRSA by PCR NEGATIVE NEGATIVE Final    Comment:        The GeneXpert MRSA Assay (FDA approved for NASAL specimens only), is one component of a comprehensive MRSA colonization surveillance program. It is not intended to diagnose MRSA infection nor to guide or monitor treatment for MRSA infections.     RADIOLOGY:  No results found.  EKG:   Orders placed or performed during the hospital encounter of 10/17/14  . ED EKG  . ED EKG  . EKG 12-Lead  . EKG 12-Lead  . EKG 12-Lead  . EKG 12-Lead  . EKG 12-Lead  . EKG 12-Lead      Management plans discussed with the patient, family and they are in agreement.  CODE STATUS:     Code Status Orders        Start     Ordered   10/17/14 0910  Full code   Continuous     10/17/14 0910    Advance Directive Documentation        Most Recent Value   Type of Advance Directive  Out of facility DNR (pink MOST or yellow form)   Pre-existing out of facility DNR order (yellow form or pink MOST form)     "MOST" Form in Place?        TOTAL TIME  TAKING CARE OF THIS PATIENT: 45  minutes.    Katharina Caper M.D on 10/24/2014 at 3:08 PM  Between 7am to 6pm - Pager - [phone]  After 6pm go to [email or web address] - password [email or web address] [facility]  [facility]  Office  [phone]  CC: Primary care physician; Fidel Levy, MD

## 2014-10-24 NOTE — Care Management (Addendum)
Primary nurse has questions about whether patient will discharge on continuous 02.  Patient has nocturnal 02 through [facility].   She does not have portable tanks for travel.  Today, patient  Has  been on 2 liters of 02 and satting 95 - 98%.  Room air sat at rest today was 99%.  Primary nurse reported that patient's exertional room air sat is 93%.   Provided with personal care agency contact list

## 2014-10-24 NOTE — Care Management (Signed)
Spoke with patient regarding discharge.  She does not want to discharge to skilled nursing.  She wishes to return home with her home health.  AT present her cultures are negative.  Discussed with [name]- granddaughter.  If patient has had a vascular procedure due to previous infections- now would be a good time to proceed with evaluation/coordination.  Provided patient with a list of PCS agencies so patient can arrange for inhome assist.  Discussed with patient of need to not sit in wet depends and if she continues to do so, she will continue to have utis.  Genevieve Norlander has been updated on discharge and  MD to enter the resumption of care orders.

## 2014-10-24 NOTE — Care Management Note (Signed)
Case Management Note  Patient Details  Name: [name] DOBRANSKY MRN: [id number] Date of Birth: May 20, 1931  Subjective/Objective:       Called Tim from [facility], to notify that pt. S/B discharged later today. She will resumption of services including PT, home aide, and nursing. Jorja Loa has agreed to re-start same. A patient personal care  Agency list has also    Been left in the pt. Room for the granddaughter ,[name]. She requested this when speaking to CM Ermalene Searing earlier.         Action/Plan:Awaiting final d/c orders to  Be eneterd.   Expected Discharge Date:                  Expected Discharge Plan:  Home w Home Health Services  In-House Referral:     Discharge planning Services  CM Consult  Post Acute Care Choice:    Choice offered to:     DME Arranged:    DME Agency:     HH Arranged:    HH Agency:  [facility]  Status of Service:  In process, will continue to follow  Medicare Important Message Given:  Yes-second notification given Date Medicare IM Given:    Medicare IM give by:    Date Additional Medicare IM Given:    Additional Medicare Important Message give by:     If discussed at Long Length of Stay Meetings, dates discussed:    Additional Comments:  Berna Bue, RN 10/24/2014, 1:00 PM

## 2014-10-25 LAB — TYPE AND SCREEN
ABO/RH(D): O POS
Antibody Screen: NEGATIVE
DONOR AG TYPE: NEGATIVE
Donor AG Type: NEGATIVE
Unit division: 0
Unit division: 0

## 2014-10-26 ENCOUNTER — Telehealth: Payer: Self-pay

## 2014-10-26 NOTE — Telephone Encounter (Signed)
Trey Paula is calling regarding patient being discharged and needing more care. He said he needs verbal for them to do a consult about Skilled Nursing. Patient has decided maybe thisis best. I gave verbal. She will follow those doctors if admitted long term.[facility]

## 2014-10-26 NOTE — Care Management (Signed)
Receiving communication that patient who discharged 9/6 with resumption of home health services rather than pursue skilled nursing placement is now wishing to pursue placement.  Left message with Arline Asp with [facility] to contact patient/granddaughter about pursuing facility placement

## 2014-10-27 ENCOUNTER — Telehealth: Payer: Self-pay | Admitting: Family Medicine

## 2014-10-27 NOTE — Telephone Encounter (Signed)
Spoke to [place] and advised that [name] will be back by 12/2014 and they had billing question regarding Oxygen order and willing to wait until she will be back [place]

## 2014-10-27 NOTE — Telephone Encounter (Signed)
Left massage for Minna Antis to call back

## 2014-10-27 NOTE — Telephone Encounter (Signed)
Agree-jh 

## 2014-10-27 NOTE — Telephone Encounter (Signed)
Lorna from [facility] called for CMN for pt's oxygen.  It was faxed over in Aug and [name] was going to fill it out and fax back.  Can you check pt's chart to see if a copy was saved and fax back to [phone].  Lorna's call back number is [phone]

## 2014-10-27 NOTE — Telephone Encounter (Signed)
For document.

## 2014-10-30 ENCOUNTER — Encounter: Payer: Self-pay | Admitting: Family Medicine

## 2014-10-30 ENCOUNTER — Ambulatory Visit (INDEPENDENT_AMBULATORY_CARE_PROVIDER_SITE_OTHER): Payer: Medicare Other | Admitting: Family Medicine

## 2014-10-30 VITALS — BP 119/66 | HR 72 | Temp 97.6°F | Resp 14

## 2014-10-30 DIAGNOSIS — Z0289 Encounter for other administrative examinations: Secondary | ICD-10-CM

## 2014-10-30 DIAGNOSIS — Z022 Encounter for examination for admission to residential institution: Secondary | ICD-10-CM

## 2014-10-30 NOTE — Patient Instructions (Signed)
FL2 form filled out and signed to place in skilled nursing unit.   Plan  is to go to [facility].  She will be cared for by in-home physician there.

## 2014-10-30 NOTE — Progress Notes (Signed)
Name: [name]   MRN: [id number]    DOB: [birth year]   Date:10/30/2014       Progress Note  Subjective  Chief Complaint  Chief Complaint  Patient presents with  . Extremity Weakness    FL2- Not able to care for herself at all. Does not get to bathroom at all. Has to sit until someone comes by.     HPI Here for FL2 form to be filled out.  Recently hospitalizesd for CHF, Pneumonia, f/u of complex UTI.  Home from hosp.  About 4-5 days.  Needs Skilled nursing placement.  RFequires OL2 at home for hypoxia from COPD and pneumonia. No problem-specific assessment & plan notes found for this encounter.   Past Medical History  Diagnosis Date  . DM2 (diabetes mellitus, type 2)     onset age 34 insulin dependent  . Hyperlipidemia   . COPD (chronic obstructive pulmonary disease)   . Peripheral vascular disease   . CAD (coronary artery disease)     a. MI 1988 s/p CABG in 1988, multiple PCI on SVGs; b. cath 2012: occluded native coronary arteries, patent LIMA to LAD (distal LAD dz), patent SVG-OM & occluded SVG-RCA which filled via left to right collats; c. cath 12/2013 patent LIMA-LAD & SVG-O. known occluded VG-RCA w/ L-R collats, no change since cath 2012  . Chronic systolic CHF (congestive heart failure)     a. 03/2012: EF 40-45%, mild lateral wall HK, mod inf HK, mod post wall HK, mild LVH, mild MR/TR   . Paroxysmal atrial fibrillation     a. CHADSVASc 7: yearly risk of CVA 9.6%;. b. on Eliquis 2.5 mg bid (age, SCr, borderline wt 62.9 Kg)   . HTN (hypertension)   . Yeast infection   . UTI (urinary tract infection)     Recurrent ESBL E.coli UTI  . Atherosclerosis of native artery of left lower extremity   . Myocardial infarction   . CKD (chronic kidney disease) stage 3, GFR 30-59 ml/min   . Hypoxia     on nocturnal o2    Social History  Substance Use Topics  . Smoking status: Former Smoker -- 15 years    Types: Cigarettes    Quit date: 02/18/1988  . Smokeless tobacco: Never  Used  . Alcohol Use: No     Current outpatient prescriptions:  .  acidophilus (RISAQUAD) CAPS capsule, Take 1 capsule by mouth 2 (two) times daily., Disp: 60 capsule, Rfl: 4 .  albuterol (PROVENTIL) (2.5 MG/3ML) 0.083% nebulizer solution, Take 3 mLs (2.5 mg total) by nebulization every 6 (six) hours as needed for wheezing or shortness of breath., Disp: 150 mL, Rfl: 1 .  amiodarone (PACERONE) 200 MG tablet, Take 1 tablet (200 mg total) by mouth daily., Disp: 90 tablet, Rfl: 3 .  apixaban (ELIQUIS) 2.5 MG TABS tablet, Take 1 tablet (2.5 mg total) by mouth 2 (two) times daily., Disp: 60 tablet, Rfl: 6 .  benazepril (LOTENSIN) 20 MG tablet, Take 20 mg by mouth daily., Disp: , Rfl:  .  carvedilol (COREG) 3.125 MG tablet, Take 1 tablet (3.125 mg total) by mouth 2 (two) times daily., Disp: 180 tablet, Rfl: 3 .  Cranberry 1000 MG CAPS, Take 1 capsule (1,000 mg total) by mouth daily., Disp: 30 each, Rfl: 12 .  docusate sodium (COLACE) 100 MG capsule, Take 1 capsule (100 mg total) by mouth 2 (two) times daily., Disp: 60 capsule, Rfl: 0 .  gabapentin (NEURONTIN) 400 MG capsule, Take 400 mg by  mouth 3 (three) times daily., Disp: , Rfl:  .  insulin lispro (HUMALOG) 100 UNIT/ML injection, Inject 0.04 mLs (4 Units total) into the skin 3 (three) times daily before meals. (Patient taking differently: Inject 10 Units into the skin 3 (three) times daily before meals. ), Disp: 10 mL, Rfl: 11 .  isosorbide mononitrate (IMDUR) 60 MG 24 hr tablet, Take 90 mg by mouth daily. , Disp: , Rfl:  .  LANTUS SOLOSTAR 100 UNIT/ML Solostar Pen, Inject 32 Units into the skin at bedtime. , Disp: , Rfl:  .  levothyroxine (SYNTHROID, LEVOTHROID) 100 MCG tablet, Take 100 mcg by mouth daily. , Disp: , Rfl:  .  nitroGLYCERIN (NITROSTAT) 0.4 MG SL tablet, Place 1 tablet (0.4 mg total) under the tongue every 5 (five) minutes as needed for chest pain., Disp: 30 tablet, Rfl: 0 .  nystatin cream (MYCOSTATIN), Apply 1 application topically 2  (two) times daily., Disp: 30 g, Rfl: 1 .  pantoprazole (PROTONIX) 40 MG tablet, Take 40 mg by mouth daily., Disp: , Rfl:  .  polyethylene glycol (MIRALAX / GLYCOLAX) packet, Take 17 g by mouth daily as needed for mild constipation or moderate constipation., Disp: 14 each, Rfl: 0 .  potassium chloride (K-DUR) 10 MEQ tablet, Take 10 mEq by mouth daily. , Disp: , Rfl:  .  pravastatin (PRAVACHOL) 40 MG tablet, Take 1 tablet (40 mg total) by mouth daily., Disp: 90 tablet, Rfl: 3 .  sodium chloride 0.9 % infusion, , Disp: , Rfl:  .  torsemide (DEMADEX) 20 MG tablet, Take 40 mg in the morning and 20 mg in the afternoon. (Patient taking differently: Take 20 mg by mouth 2 (two) times daily. ), Disp: 180 tablet, Rfl: 3  Allergies  Allergen Reactions  . Contrast Media [Iodinated Diagnostic Agents] Shortness Of Breath  . Tramadol Nausea Only  . Valium [Diazepam] Other (See Comments)    Reaction:  Elevated heart rate  . Iodine Strong [Iodine] Rash  . Penicillins Rash    Review of Systems  Constitutional: Positive for malaise/fatigue. Negative for fever, chills and weight loss.  HENT: Negative for hearing loss.   Eyes: Negative for blurred vision and double vision.  Respiratory: Positive for cough and shortness of breath. Negative for sputum production and wheezing.   Cardiovascular: Negative for chest pain, palpitations and leg swelling.  Gastrointestinal: Negative for heartburn, abdominal pain and blood in stool.  Genitourinary: Negative for dysuria, urgency and frequency.  Neurological: Positive for weakness. Negative for dizziness, sensory change, focal weakness and headaches.  Psychiatric/Behavioral: Negative for depression. The patient is not nervous/anxious.       Objective  Filed Vitals:   10/30/14 1434  BP: 119/66  Pulse: 72  Temp: 97.6 F (36.4 C)  Resp: 14     Physical Exam  Constitutional:  Sitting in wheelchair.  Too weak to stand.  HENT:  Head: Normocephalic and  atraumatic.  Neck: Normal range of motion. Neck supple. Carotid bruit is not present. No thyromegaly present.  Cardiovascular: Normal rate and regular rhythm.  Exam reveals no gallop and no friction rub.   No murmur heard. Pulmonary/Chest: Effort normal. No respiratory distress.  Decreased breath sounds throughout.  Abdominal: Soft. There is no tenderness.  Musculoskeletal: She exhibits no edema.  Lymphadenopathy:    She has no cervical adenopathy.  Skin:  Dries and healing ulcer over med. Malleolus of L. Ankle.  Vitals reviewed.        Assessment & Plan  1. Encounter for  examination for admission to nursing home -FL 2 form filled out and signed.

## 2014-10-31 ENCOUNTER — Other Ambulatory Visit: Payer: Medicare Other | Admitting: Urology

## 2014-10-31 ENCOUNTER — Encounter: Payer: Self-pay | Admitting: Urology

## 2014-11-02 ENCOUNTER — Encounter: Payer: Medicare Other | Admitting: Cardiovascular Disease

## 2014-11-09 ENCOUNTER — Encounter: Payer: Self-pay | Admitting: Intensive Care

## 2014-11-09 ENCOUNTER — Emergency Department: Payer: Medicare Other

## 2014-11-09 ENCOUNTER — Inpatient Hospital Stay
Admission: EM | Admit: 2014-11-09 | Discharge: 2014-12-04 | DRG: 291 | Disposition: A | Discharge status: Skilled Nursing Facility | Payer: Medicare Other | Attending: Internal Medicine | Admitting: Internal Medicine

## 2014-11-09 DIAGNOSIS — Z9861 Coronary angioplasty status: Secondary | ICD-10-CM

## 2014-11-09 DIAGNOSIS — L309 Dermatitis, unspecified: Secondary | ICD-10-CM | POA: Diagnosis present

## 2014-11-09 DIAGNOSIS — I480 Paroxysmal atrial fibrillation: Secondary | ICD-10-CM | POA: Diagnosis present

## 2014-11-09 DIAGNOSIS — Z789 Other specified health status: Secondary | ICD-10-CM | POA: Diagnosis not present

## 2014-11-09 DIAGNOSIS — I5022 Chronic systolic (congestive) heart failure: Secondary | ICD-10-CM

## 2014-11-09 DIAGNOSIS — E875 Hyperkalemia: Secondary | ICD-10-CM | POA: Diagnosis present

## 2014-11-09 DIAGNOSIS — T502X5A Adverse effect of carbonic-anhydrase inhibitors, benzothiadiazides and other diuretics, initial encounter: Secondary | ICD-10-CM | POA: Diagnosis present

## 2014-11-09 DIAGNOSIS — I1310 Hypertensive heart and chronic kidney disease without heart failure, with stage 1 through stage 4 chronic kidney disease, or unspecified chronic kidney disease: Secondary | ICD-10-CM | POA: Diagnosis present

## 2014-11-09 DIAGNOSIS — E039 Hypothyroidism, unspecified: Secondary | ICD-10-CM | POA: Diagnosis present

## 2014-11-09 DIAGNOSIS — Z87440 Personal history of urinary (tract) infections: Secondary | ICD-10-CM | POA: Diagnosis not present

## 2014-11-09 DIAGNOSIS — I5043 Acute on chronic combined systolic (congestive) and diastolic (congestive) heart failure: Secondary | ICD-10-CM | POA: Diagnosis present

## 2014-11-09 DIAGNOSIS — Z951 Presence of aortocoronary bypass graft: Secondary | ICD-10-CM

## 2014-11-09 DIAGNOSIS — D509 Iron deficiency anemia, unspecified: Secondary | ICD-10-CM | POA: Diagnosis present

## 2014-11-09 DIAGNOSIS — Z8572 Personal history of non-Hodgkin lymphomas: Secondary | ICD-10-CM | POA: Diagnosis not present

## 2014-11-09 DIAGNOSIS — K5900 Constipation, unspecified: Secondary | ICD-10-CM | POA: Diagnosis present

## 2014-11-09 DIAGNOSIS — R188 Other ascites: Secondary | ICD-10-CM | POA: Diagnosis present

## 2014-11-09 DIAGNOSIS — Z880 Allergy status to penicillin: Secondary | ICD-10-CM

## 2014-11-09 DIAGNOSIS — R627 Adult failure to thrive: Secondary | ICD-10-CM | POA: Diagnosis present

## 2014-11-09 DIAGNOSIS — R0602 Shortness of breath: Secondary | ICD-10-CM

## 2014-11-09 DIAGNOSIS — R748 Abnormal levels of other serum enzymes: Secondary | ICD-10-CM | POA: Diagnosis present

## 2014-11-09 DIAGNOSIS — E65 Localized adiposity: Secondary | ICD-10-CM | POA: Diagnosis present

## 2014-11-09 DIAGNOSIS — Z794 Long term (current) use of insulin: Secondary | ICD-10-CM | POA: Diagnosis not present

## 2014-11-09 DIAGNOSIS — I255 Ischemic cardiomyopathy: Secondary | ICD-10-CM | POA: Diagnosis present

## 2014-11-09 DIAGNOSIS — Z452 Encounter for adjustment and management of vascular access device: Secondary | ICD-10-CM

## 2014-11-09 DIAGNOSIS — I10 Essential (primary) hypertension: Secondary | ICD-10-CM | POA: Diagnosis present

## 2014-11-09 DIAGNOSIS — B9620 Unspecified Escherichia coli [E. coli] as the cause of diseases classified elsewhere: Secondary | ICD-10-CM | POA: Diagnosis present

## 2014-11-09 DIAGNOSIS — Z9981 Dependence on supplemental oxygen: Secondary | ICD-10-CM | POA: Diagnosis not present

## 2014-11-09 DIAGNOSIS — N184 Chronic kidney disease, stage 4 (severe): Secondary | ICD-10-CM | POA: Diagnosis present

## 2014-11-09 DIAGNOSIS — M1990 Unspecified osteoarthritis, unspecified site: Secondary | ICD-10-CM | POA: Diagnosis present

## 2014-11-09 DIAGNOSIS — J449 Chronic obstructive pulmonary disease, unspecified: Secondary | ICD-10-CM | POA: Diagnosis present

## 2014-11-09 DIAGNOSIS — E1122 Type 2 diabetes mellitus with diabetic chronic kidney disease: Secondary | ICD-10-CM | POA: Diagnosis present

## 2014-11-09 DIAGNOSIS — I959 Hypotension, unspecified: Secondary | ICD-10-CM | POA: Diagnosis present

## 2014-11-09 DIAGNOSIS — R32 Unspecified urinary incontinence: Secondary | ICD-10-CM | POA: Diagnosis present

## 2014-11-09 DIAGNOSIS — Z7401 Bed confinement status: Secondary | ICD-10-CM

## 2014-11-09 DIAGNOSIS — Z66 Do not resuscitate: Secondary | ICD-10-CM | POA: Diagnosis present

## 2014-11-09 DIAGNOSIS — R059 Cough, unspecified: Secondary | ICD-10-CM

## 2014-11-09 DIAGNOSIS — Z833 Family history of diabetes mellitus: Secondary | ICD-10-CM

## 2014-11-09 DIAGNOSIS — L89152 Pressure ulcer of sacral region, stage 2: Secondary | ICD-10-CM | POA: Diagnosis present

## 2014-11-09 DIAGNOSIS — R600 Localized edema: Secondary | ICD-10-CM | POA: Diagnosis not present

## 2014-11-09 DIAGNOSIS — Z8249 Family history of ischemic heart disease and other diseases of the circulatory system: Secondary | ICD-10-CM | POA: Diagnosis not present

## 2014-11-09 DIAGNOSIS — E873 Alkalosis: Secondary | ICD-10-CM | POA: Diagnosis present

## 2014-11-09 DIAGNOSIS — I272 Other secondary pulmonary hypertension: Secondary | ICD-10-CM | POA: Diagnosis present

## 2014-11-09 DIAGNOSIS — I25810 Atherosclerosis of coronary artery bypass graft(s) without angina pectoris: Secondary | ICD-10-CM | POA: Diagnosis present

## 2014-11-09 DIAGNOSIS — E43 Unspecified severe protein-calorie malnutrition: Secondary | ICD-10-CM | POA: Diagnosis present

## 2014-11-09 DIAGNOSIS — R944 Abnormal results of kidney function studies: Secondary | ICD-10-CM | POA: Diagnosis present

## 2014-11-09 DIAGNOSIS — J9611 Chronic respiratory failure with hypoxia: Secondary | ICD-10-CM | POA: Diagnosis present

## 2014-11-09 DIAGNOSIS — Z823 Family history of stroke: Secondary | ICD-10-CM

## 2014-11-09 DIAGNOSIS — Z9049 Acquired absence of other specified parts of digestive tract: Secondary | ICD-10-CM | POA: Diagnosis not present

## 2014-11-09 DIAGNOSIS — Z6836 Body mass index (BMI) 36.0-36.9, adult: Secondary | ICD-10-CM | POA: Diagnosis not present

## 2014-11-09 DIAGNOSIS — Z87891 Personal history of nicotine dependence: Secondary | ICD-10-CM | POA: Diagnosis not present

## 2014-11-09 DIAGNOSIS — Z9889 Other specified postprocedural states: Secondary | ICD-10-CM

## 2014-11-09 DIAGNOSIS — R05 Cough: Secondary | ICD-10-CM

## 2014-11-09 DIAGNOSIS — E11649 Type 2 diabetes mellitus with hypoglycemia without coma: Secondary | ICD-10-CM | POA: Diagnosis present

## 2014-11-09 DIAGNOSIS — Z96641 Presence of right artificial hip joint: Secondary | ICD-10-CM | POA: Diagnosis present

## 2014-11-09 DIAGNOSIS — I129 Hypertensive chronic kidney disease with stage 1 through stage 4 chronic kidney disease, or unspecified chronic kidney disease: Secondary | ICD-10-CM | POA: Diagnosis present

## 2014-11-09 DIAGNOSIS — R7989 Other specified abnormal findings of blood chemistry: Secondary | ICD-10-CM | POA: Diagnosis present

## 2014-11-09 DIAGNOSIS — E785 Hyperlipidemia, unspecified: Secondary | ICD-10-CM | POA: Diagnosis present

## 2014-11-09 DIAGNOSIS — I2510 Atherosclerotic heart disease of native coronary artery without angina pectoris: Secondary | ICD-10-CM | POA: Diagnosis present

## 2014-11-09 DIAGNOSIS — Z91041 Radiographic dye allergy status: Secondary | ICD-10-CM | POA: Diagnosis not present

## 2014-11-09 DIAGNOSIS — N170 Acute kidney failure with tubular necrosis: Secondary | ICD-10-CM | POA: Diagnosis present

## 2014-11-09 DIAGNOSIS — I5023 Acute on chronic systolic (congestive) heart failure: Secondary | ICD-10-CM | POA: Diagnosis not present

## 2014-11-09 DIAGNOSIS — Z79899 Other long term (current) drug therapy: Secondary | ICD-10-CM | POA: Diagnosis not present

## 2014-11-09 DIAGNOSIS — N179 Acute kidney failure, unspecified: Secondary | ICD-10-CM | POA: Diagnosis not present

## 2014-11-09 DIAGNOSIS — E860 Dehydration: Secondary | ICD-10-CM | POA: Diagnosis present

## 2014-11-09 DIAGNOSIS — I340 Nonrheumatic mitral (valve) insufficiency: Secondary | ICD-10-CM | POA: Diagnosis not present

## 2014-11-09 DIAGNOSIS — I70202 Unspecified atherosclerosis of native arteries of extremities, left leg: Secondary | ICD-10-CM | POA: Diagnosis present

## 2014-11-09 DIAGNOSIS — M542 Cervicalgia: Secondary | ICD-10-CM | POA: Diagnosis present

## 2014-11-09 DIAGNOSIS — R778 Other specified abnormalities of plasma proteins: Secondary | ICD-10-CM | POA: Diagnosis present

## 2014-11-09 DIAGNOSIS — N390 Urinary tract infection, site not specified: Secondary | ICD-10-CM | POA: Diagnosis present

## 2014-11-09 DIAGNOSIS — Z96651 Presence of right artificial knee joint: Secondary | ICD-10-CM | POA: Diagnosis present

## 2014-11-09 DIAGNOSIS — Z515 Encounter for palliative care: Secondary | ICD-10-CM | POA: Diagnosis not present

## 2014-11-09 DIAGNOSIS — I252 Old myocardial infarction: Secondary | ICD-10-CM | POA: Diagnosis not present

## 2014-11-09 DIAGNOSIS — R0902 Hypoxemia: Secondary | ICD-10-CM

## 2014-11-09 DIAGNOSIS — D631 Anemia in chronic kidney disease: Secondary | ICD-10-CM | POA: Diagnosis present

## 2014-11-09 DIAGNOSIS — Z888 Allergy status to other drugs, medicaments and biological substances status: Secondary | ICD-10-CM | POA: Diagnosis not present

## 2014-11-09 DIAGNOSIS — G253 Myoclonus: Secondary | ICD-10-CM | POA: Diagnosis present

## 2014-11-09 DIAGNOSIS — E119 Type 2 diabetes mellitus without complications: Secondary | ICD-10-CM

## 2014-11-09 DIAGNOSIS — N189 Chronic kidney disease, unspecified: Secondary | ICD-10-CM

## 2014-11-09 LAB — LACTIC ACID, PLASMA: Lactic Acid, Venous: 1.7 mmol/L (ref 0.5–2.0)

## 2014-11-09 LAB — COMPREHENSIVE METABOLIC PANEL
ALK PHOS: 78 U/L (ref 38–126)
ALT: 9 U/L — AB (ref 14–54)
AST: 19 U/L (ref 15–41)
Albumin: 3.3 g/dL — ABNORMAL LOW (ref 3.5–5.0)
Anion gap: 10 (ref 5–15)
BILIRUBIN TOTAL: 0.5 mg/dL (ref 0.3–1.2)
BUN: 65 mg/dL — AB (ref 6–20)
CHLORIDE: 97 mmol/L — AB (ref 101–111)
CO2: 30 mmol/L (ref 22–32)
CREATININE: 2.68 mg/dL — AB (ref 0.44–1.00)
Calcium: 8.5 mg/dL — ABNORMAL LOW (ref 8.9–10.3)
GFR calc Af Amer: 18 mL/min — ABNORMAL LOW (ref >60)
GFR, EST NON AFRICAN AMERICAN: 16 mL/min — AB (ref >60)
Glucose, Bld: 181 mg/dL — ABNORMAL HIGH (ref 65–99)
Potassium: 5.4 mmol/L — ABNORMAL HIGH (ref 3.5–5.1)
Sodium: 137 mmol/L (ref 135–145)
TOTAL PROTEIN: 6.3 g/dL — AB (ref 6.5–8.1)

## 2014-11-09 LAB — URINALYSIS COMPLETE WITH MICROSCOPIC (ARMC ONLY)
Bacteria, UA: NONE SEEN
Bilirubin Urine: NEGATIVE
Glucose, UA: NEGATIVE mg/dL
KETONES UR: NEGATIVE mg/dL
Nitrite: NEGATIVE
PROTEIN: NEGATIVE mg/dL
Specific Gravity, Urine: 1.011 (ref 1.005–1.030)
pH: 5 (ref 5.0–8.0)

## 2014-11-09 LAB — CBC WITH DIFFERENTIAL/PLATELET
Basophils Absolute: 0.1 10*3/uL (ref 0–0.1)
Basophils Relative: 2 %
EOS ABS: 0.2 10*3/uL (ref 0–0.7)
EOS PCT: 3 %
HCT: 31.8 % — ABNORMAL LOW (ref 35.0–47.0)
Hemoglobin: 9.6 g/dL — ABNORMAL LOW (ref 12.0–16.0)
LYMPHS ABS: 0.7 10*3/uL — AB (ref 1.0–3.6)
Lymphocytes Relative: 13 %
MCH: 23.9 pg — AB (ref 26.0–34.0)
MCHC: 30.4 g/dL — ABNORMAL LOW (ref 32.0–36.0)
MCV: 78.7 fL — ABNORMAL LOW (ref 80.0–100.0)
Monocytes Absolute: 0.6 10*3/uL (ref 0.2–0.9)
Monocytes Relative: 11 %
Neutro Abs: 3.9 10*3/uL (ref 1.4–6.5)
Neutrophils Relative %: 71 %
PLATELETS: 255 10*3/uL (ref 150–440)
RBC: 4.03 MIL/uL (ref 3.80–5.20)
RDW: 23.5 % — ABNORMAL HIGH (ref 11.5–14.5)
WBC: 5.5 10*3/uL (ref 3.6–11.0)

## 2014-11-09 LAB — GLUCOSE, CAPILLARY: Glucose-Capillary: 188 mg/dL — ABNORMAL HIGH (ref 65–99)

## 2014-11-09 LAB — MRSA PCR SCREENING: MRSA BY PCR: NEGATIVE

## 2014-11-09 LAB — TROPONIN I: TROPONIN I: 0.05 ng/mL — AB (ref <0.031)

## 2014-11-09 MED ORDER — RISAQUAD PO CAPS
1.0000 | ORAL_CAPSULE | Freq: Two times a day (BID) | ORAL | Status: DC
  Administered 2014-11-09 – 2014-12-03 (×48): 1 via ORAL
  Filled 2014-11-09 (×48): qty 1

## 2014-11-09 MED ORDER — ISOSORBIDE MONONITRATE ER 30 MG PO TB24
90.0000 mg | ORAL_TABLET | Freq: Every day | ORAL | Status: DC
  Filled 2014-11-09: qty 3

## 2014-11-09 MED ORDER — CARVEDILOL 3.125 MG PO TABS
3.1250 mg | ORAL_TABLET | Freq: Two times a day (BID) | ORAL | Status: DC
  Filled 2014-11-09 (×2): qty 1

## 2014-11-09 MED ORDER — ONDANSETRON HCL 4 MG PO TABS: 4.0000 mg | ORAL_TABLET | Freq: Four times a day (QID) | ORAL | Status: DC | PRN

## 2014-11-09 MED ORDER — ALBUTEROL SULFATE (2.5 MG/3ML) 0.083% IN NEBU
2.5000 mg | INHALATION_SOLUTION | Freq: Four times a day (QID) | RESPIRATORY_TRACT | Status: DC | PRN
  Administered 2014-11-17: 2.5 mg via RESPIRATORY_TRACT
  Filled 2014-11-09: qty 3

## 2014-11-09 MED ORDER — LUBIPROSTONE 24 MCG PO CAPS
24.0000 ug | ORAL_CAPSULE | Freq: Two times a day (BID) | ORAL | Status: DC
  Administered 2014-11-10 – 2014-11-30 (×40): 24 ug via ORAL
  Filled 2014-11-09 (×41): qty 1

## 2014-11-09 MED ORDER — DOCUSATE SODIUM 100 MG PO CAPS
100.0000 mg | ORAL_CAPSULE | Freq: Two times a day (BID) | ORAL | Status: DC
  Administered 2014-11-09 – 2014-11-28 (×38): 100 mg via ORAL
  Filled 2014-11-09 (×38): qty 1

## 2014-11-09 MED ORDER — NITROGLYCERIN 0.4 MG SL SUBL
0.4000 mg | SUBLINGUAL_TABLET | SUBLINGUAL | Status: DC | PRN
  Administered 2014-11-21: 0.4 mg via SUBLINGUAL
  Filled 2014-11-09: qty 1

## 2014-11-09 MED ORDER — INSULIN GLARGINE 100 UNIT/ML ~~LOC~~ SOLN
32.0000 [IU] | Freq: Every day | SUBCUTANEOUS | Status: DC
  Administered 2014-11-09 – 2014-11-11 (×3): 32 [IU] via SUBCUTANEOUS
  Filled 2014-11-09 (×4): qty 0.32

## 2014-11-09 MED ORDER — PRAVASTATIN SODIUM 20 MG PO TABS
40.0000 mg | ORAL_TABLET | Freq: Every day | ORAL | Status: DC
  Administered 2014-11-09 – 2014-11-15 (×7): 40 mg via ORAL
  Filled 2014-11-09 (×6): qty 2

## 2014-11-09 MED ORDER — POLYETHYLENE GLYCOL 3350 17 G PO PACK
17.0000 g | PACK | Freq: Every day | ORAL | Status: DC | PRN
  Administered 2014-11-16: 13:00:00 17 g via ORAL
  Filled 2014-11-09: qty 1

## 2014-11-09 MED ORDER — GABAPENTIN 400 MG PO CAPS
400.0000 mg | ORAL_CAPSULE | Freq: Three times a day (TID) | ORAL | Status: DC
  Administered 2014-11-09 – 2014-11-13 (×11): 400 mg via ORAL
  Filled 2014-11-09 (×11): qty 1

## 2014-11-09 MED ORDER — ACETAMINOPHEN 325 MG PO TABS
650.0000 mg | ORAL_TABLET | Freq: Four times a day (QID) | ORAL | Status: DC | PRN
  Administered 2014-11-10 – 2014-11-24 (×2): 650 mg via ORAL
  Filled 2014-11-09 (×2): qty 2

## 2014-11-09 MED ORDER — SODIUM CHLORIDE 0.9 % IV SOLN
INTRAVENOUS | Status: DC
  Administered 2014-11-09 – 2014-11-11 (×3): via INTRAVENOUS

## 2014-11-09 MED ORDER — ACETAMINOPHEN 650 MG RE SUPP: 650.0000 mg | Freq: Four times a day (QID) | RECTAL | Status: DC | PRN

## 2014-11-09 MED ORDER — HYDROCODONE-ACETAMINOPHEN 5-325 MG PO TABS
1.0000 | ORAL_TABLET | Freq: Two times a day (BID) | ORAL | Status: DC | PRN
Start: 2014-11-09 — End: 2014-11-10
  Administered 2014-11-10: 1 via ORAL
  Filled 2014-11-09: qty 1

## 2014-11-09 MED ORDER — AMIODARONE HCL 200 MG PO TABS
200.0000 mg | ORAL_TABLET | Freq: Every day | ORAL | Status: DC
  Administered 2014-11-11 – 2014-12-04 (×24): 200 mg via ORAL
  Filled 2014-11-09 (×26): qty 1

## 2014-11-09 MED ORDER — ONDANSETRON HCL 4 MG/2ML IJ SOLN
4.0000 mg | Freq: Four times a day (QID) | INTRAMUSCULAR | Status: DC | PRN
  Administered 2014-11-12 – 2014-12-03 (×13): 4 mg via INTRAVENOUS
  Filled 2014-11-09 (×13): qty 2

## 2014-11-09 MED ORDER — LEVOTHYROXINE SODIUM 100 MCG PO TABS
100.0000 ug | ORAL_TABLET | Freq: Every day | ORAL | Status: DC
  Administered 2014-11-10 – 2014-12-04 (×25): 100 ug via ORAL
  Filled 2014-11-09 (×26): qty 1

## 2014-11-09 MED ORDER — INSULIN ASPART 100 UNIT/ML ~~LOC~~ SOLN
0.0000 [IU] | Freq: Three times a day (TID) | SUBCUTANEOUS | Status: DC
  Administered 2014-11-10: 1 [IU] via SUBCUTANEOUS
  Administered 2014-11-10: 18:00:00 3 [IU] via SUBCUTANEOUS
  Administered 2014-11-11 – 2014-11-12 (×3): 2 [IU] via SUBCUTANEOUS
  Administered 2014-11-13: 12:00:00 1 [IU] via SUBCUTANEOUS
  Administered 2014-11-13: 18:00:00 2 [IU] via SUBCUTANEOUS
  Administered 2014-11-14: 1 [IU] via SUBCUTANEOUS
  Administered 2014-11-14: 3 [IU] via SUBCUTANEOUS
  Administered 2014-11-15 – 2014-11-16 (×3): 2 [IU] via SUBCUTANEOUS
  Administered 2014-11-17: 5 [IU] via SUBCUTANEOUS
  Administered 2014-11-17: 12:00:00 3 [IU] via SUBCUTANEOUS
  Administered 2014-11-20 – 2014-11-22 (×2): 2 [IU] via SUBCUTANEOUS
  Administered 2014-11-22: 5 [IU] via SUBCUTANEOUS
  Administered 2014-11-23: 2 [IU] via SUBCUTANEOUS
  Administered 2014-11-23: 1 [IU] via SUBCUTANEOUS
  Administered 2014-11-26: 3 [IU] via SUBCUTANEOUS
  Administered 2014-11-26 – 2014-11-27 (×3): 2 [IU] via SUBCUTANEOUS
  Administered 2014-11-27: 1 [IU] via SUBCUTANEOUS
  Administered 2014-11-27 – 2014-11-28 (×4): 2 [IU] via SUBCUTANEOUS
  Administered 2014-11-29 (×2): 3 [IU] via SUBCUTANEOUS
  Administered 2014-11-29: 2 [IU] via SUBCUTANEOUS
  Administered 2014-11-30 – 2014-12-01 (×4): 3 [IU] via SUBCUTANEOUS
  Administered 2014-12-01: 1 [IU] via SUBCUTANEOUS
  Administered 2014-12-01: 5 [IU] via SUBCUTANEOUS
  Administered 2014-12-02: 1 [IU] via SUBCUTANEOUS
  Administered 2014-12-02: 3 [IU] via SUBCUTANEOUS
  Administered 2014-12-02 – 2014-12-03 (×2): 2 [IU] via SUBCUTANEOUS
  Administered 2014-12-03 – 2014-12-04 (×3): 1 [IU] via SUBCUTANEOUS
  Filled 2014-11-09: qty 3
  Filled 2014-11-09 (×2): qty 1
  Filled 2014-11-09: qty 3
  Filled 2014-11-09: qty 5
  Filled 2014-11-09 (×2): qty 2
  Filled 2014-11-09 (×2): qty 1
  Filled 2014-11-09 (×2): qty 2
  Filled 2014-11-09: qty 3
  Filled 2014-11-09: qty 1
  Filled 2014-11-09 (×2): qty 2
  Filled 2014-11-09: qty 3
  Filled 2014-11-09 (×4): qty 2
  Filled 2014-11-09: qty 1
  Filled 2014-11-09 (×2): qty 2
  Filled 2014-11-09: qty 5
  Filled 2014-11-09: qty 1
  Filled 2014-11-09: qty 2
  Filled 2014-11-09: qty 3
  Filled 2014-11-09: qty 2
  Filled 2014-11-09 (×2): qty 3
  Filled 2014-11-09: qty 2
  Filled 2014-11-09: qty 3
  Filled 2014-11-09: qty 2
  Filled 2014-11-09: qty 3
  Filled 2014-11-09: qty 1
  Filled 2014-11-09 (×2): qty 3
  Filled 2014-11-09 (×3): qty 2
  Filled 2014-11-09: qty 3
  Filled 2014-11-09: qty 1
  Filled 2014-11-09: qty 2
  Filled 2014-11-09: qty 1

## 2014-11-09 MED ORDER — APIXABAN 2.5 MG PO TABS
2.5000 mg | ORAL_TABLET | Freq: Two times a day (BID) | ORAL | Status: DC
  Administered 2014-11-09 – 2014-12-04 (×50): 2.5 mg via ORAL
  Filled 2014-11-09 (×51): qty 1

## 2014-11-09 MED ORDER — INSULIN ASPART 100 UNIT/ML ~~LOC~~ SOLN
0.0000 [IU] | Freq: Every day | SUBCUTANEOUS | Status: DC
Start: 2014-11-09 — End: 2014-12-04
  Administered 2014-11-10 – 2014-11-17 (×2): 2 [IU] via SUBCUTANEOUS
  Administered 2014-11-26: 3 [IU] via SUBCUTANEOUS
  Administered 2014-11-28 – 2014-11-30 (×3): 2 [IU] via SUBCUTANEOUS
  Administered 2014-12-01: 3 [IU] via SUBCUTANEOUS
  Filled 2014-11-09: qty 3
  Filled 2014-11-09 (×2): qty 2
  Filled 2014-11-09: qty 3
  Filled 2014-11-09 (×2): qty 2

## 2014-11-09 NOTE — ED Notes (Signed)
Patient arrived by ems from [facility]. Patients Dr. Carlyon Prows she is septic and sent to hospital for workup

## 2014-11-09 NOTE — ED Provider Notes (Signed)
[facility] Emergency Department Provider Note     Time seen: ----------------------------------------- 1:58 PM on 11/09/2014 -----------------------------------------    I have reviewed the triage vital signs and the nursing notes.   HISTORY  Chief Complaint Blood Infection    HPI [name] is a 81 y.o. female lives in a nursing home since the ER for lab abnormalities. There is concern based on the lab work that she is septic or that she is in acute renal failure. Patient denies any complaints, states that she's been treated for a urinary tract infection recently. Patient states she runs a chronically low blood pressure, has had trouble with swelling in her legs.   Past Medical History  Diagnosis Date  . DM2 (diabetes mellitus, type 2)     onset age 60 insulin dependent  . Hyperlipidemia   . COPD (chronic obstructive pulmonary disease)   . Peripheral vascular disease   . CAD (coronary artery disease)     a. MI 1988 s/p CABG in 1988, multiple PCI on SVGs; b. cath 2012: occluded native coronary arteries, patent LIMA to LAD (distal LAD dz), patent SVG-OM & occluded SVG-RCA which filled via left to right collats; c. cath 12/2013 patent LIMA-LAD & SVG-O. known occluded VG-RCA w/ L-R collats, no change since cath 2012  . Chronic systolic CHF (congestive heart failure)     a. 03/2012: EF 40-45%, mild lateral wall HK, mod inf HK, mod post wall HK, mild LVH, mild MR/TR   . Paroxysmal atrial fibrillation     a. CHADSVASc 7: yearly risk of CVA 9.6%;. b. on Eliquis 2.5 mg bid (age, SCr, borderline wt 62.9 Kg)   . HTN (hypertension)   . Yeast infection   . UTI (urinary tract infection)     Recurrent ESBL E.coli UTI  . Atherosclerosis of native artery of left lower extremity   . Myocardial infarction   . CKD (chronic kidney disease) stage 3, GFR 30-59 ml/min   . Hypoxia     on nocturnal o2    Patient Active Problem List   Diagnosis Date Noted  .  Acute renal failure due to tubular necrosis 10/24/2014  . Acute on chronic systolic CHF (congestive heart failure) 10/24/2014  . Acute delirium 10/24/2014  . Hip pain 10/24/2014  . Acute respiratory failure with hypoxia   . Elevated troponin   . Recurrent UTI   . Pneumonia 10/17/2014  . Pressure ulcer 09/30/2014  . Ileus 09/29/2014  . Chronic left hip pain 09/12/2014  . Healthcare-associated pneumonia 08/31/2014  . UTI (lower urinary tract infection) 08/11/2014  . Anemia 07/25/2014  . NSTEMI, initial episode of care 06/17/2014  . Degenerative arthritis of hip 04/11/2014  . Pre-operative cardiovascular examination 02/27/2014  . COPD (chronic obstructive pulmonary disease)   . DM2 (diabetes mellitus, type 2)   . Hyperlipidemia   . Chest pain 12/21/2013  . Femoral distal fracture 07/28/2013  . Chronic systolic heart failure 05/03/2013  . Congestive heart failure 04/08/2013  . CAD (coronary artery disease)   . Peripheral vascular disease   . Paroxysmal atrial fibrillation   . HTN (hypertension)   . Atherosclerosis of native arteries of the extremities with intermittent claudication 03/19/2011    Past Surgical History  Procedure Laterality Date  . Abdominal hysterectomy    . Coronary artery bypass graft    . Appendectomy    . Cholecystectomy    . Ovary surgery    . Angioplasty / stenting femoral  Bilateral SFA stenting  . Femur surgery    . Cardiac catheterization      [facility]  . Cardiac catheterization  12/2013  . Right hip replacement    . Total knee arthroplasty      right knee    Allergies Contrast media; Tramadol; Valium; Iodine strong; and Penicillins  Social History Social History  Substance Use Topics  . Smoking status: Former Smoker -- 15 years    Types: Cigarettes    Quit date: 02/18/1988  . Smokeless tobacco: Never Used  . Alcohol Use: No    Review of Systems Constitutional: Negative for fever. Eyes: Negative for visual changes. ENT: Negative for  sore throat. Cardiovascular: Negative for chest pain. Respiratory: Negative for shortness of breath. Gastrointestinal: Negative for abdominal pain, vomiting and diarrhea. Genitourinary: Negative for dysuria. Musculoskeletal: Negative for back pain. Skin: Has swelling on her legs. Neurological: Negative for headaches, focal weakness or numbness.  10-point ROS otherwise negative.  ____________________________________________   PHYSICAL EXAM:  VITAL SIGNS: ED Triage Vitals  Enc Vitals Group     BP --      Pulse --      Resp --      Temp --      Temp src --      SpO2 --      Weight --      Height --      Head Cir --      Peak Flow --      Pain Score --      Pain Loc --      Pain Edu? --      Excl. in GC? --     Constitutional: Alert and oriented. Well appearing and in no distress. Eyes: Conjunctivae are normal. PERRL. Normal extraocular movements. ENT   Head: Normocephalic and atraumatic.   Nose: No congestion/rhinnorhea.   Mouth/Throat: Mucous membranes are moist.   Neck: No stridor. Cardiovascular: Normal rate, regular rhythm. Normal and symmetric distal pulses are present in all extremities. No murmurs, rubs, or gallops. Respiratory: Normal respiratory effort without tachypnea nor retractions. Breath sounds are clear and equal bilaterally. No wheezes/rales/rhonchi. Gastrointestinal: Soft and nontender. No distention. No abdominal bruits.  Musculoskeletal: Bilateral edema, limited range of motion. There is a blister formation of the left lower extremity. Neurologic:  Normal speech and language. No gross focal neurologic deficits are appreciated. Skin:  Skin is warm, dry and intact blister formation is located on the outer aspect of the left lower leg Psychiatric: Mood and affect are normal. Speech and behavior are normal. Patient exhibits appropriate insight and judgment. ____________________________________________  EKG: Interpreted by me. Wide QRS rhythm  with a rate of 57 bpm, right bundle branch block, septal infarct, normal axis.  ____________________________________________  ED COURSE:  Pertinent labs & imaging results that were available during my care of the patient were reviewed by me and considered in my medical decision making (see chart for details). She is in no acute distress, will check labs and assess for sepsis and renal failure ____________________________________________    LABS (pertinent positives/negatives)  Labs Reviewed  COMPREHENSIVE METABOLIC PANEL - Abnormal; Notable for the following:    Potassium 5.4 (*)    Chloride 97 (*)    Glucose, Bld 181 (*)    BUN 65 (*)    Creatinine, Ser 2.68 (*)    Calcium 8.5 (*)    Total Protein 6.3 (*)    Albumin 3.3 (*)    ALT 9 (*)  GFR calc non Af Amer 16 (*)    GFR calc Af Amer 18 (*)    All other components within normal limits  CBC WITH DIFFERENTIAL/PLATELET - Abnormal; Notable for the following:    Hemoglobin 9.6 (*)    HCT 31.8 (*)    MCV 78.7 (*)    MCH 23.9 (*)    MCHC 30.4 (*)    RDW 23.5 (*)    Lymphs Abs 0.7 (*)    All other components within normal limits  TROPONIN I - Abnormal; Notable for the following:    Troponin I 0.05 (*)    All other components within normal limits  URINALYSIS COMPLETEWITH MICROSCOPIC ([facility] ONLY) - Abnormal; Notable for the following:    Color, Urine YELLOW (*)    APPearance HAZY (*)    Hgb urine dipstick 1+ (*)    Leukocytes, UA 2+ (*)    Squamous Epithelial / LPF 0-5 (*)    All other components within normal limits  BASIC METABOLIC PANEL - Abnormal; Notable for the following:    Potassium 5.5 (*)    Glucose, Bld 171 (*)    BUN 65 (*)    Creatinine, Ser 2.50 (*)    Calcium 8.0 (*)    GFR calc non Af Amer 17 (*)    GFR calc Af Amer 20 (*)    All other components within normal limits  GLUCOSE, CAPILLARY - Abnormal; Notable for the following:    Glucose-Capillary 188 (*)    All other components within normal limits   GLUCOSE, CAPILLARY - Abnormal; Notable for the following:    Glucose-Capillary 146 (*)    All other components within normal limits  GLUCOSE, CAPILLARY - Abnormal; Notable for the following:    Glucose-Capillary 120 (*)    All other components within normal limits  GLUCOSE, CAPILLARY - Abnormal; Notable for the following:    Glucose-Capillary 243 (*)    All other components within normal limits  BASIC METABOLIC PANEL - Abnormal; Notable for the following:    CO2 33 (*)    BUN 64 (*)    Creatinine, Ser 2.30 (*)    Calcium 8.3 (*)    GFR calc non Af Amer 19 (*)    GFR calc Af Amer 22 (*)    All other components within normal limits  GLUCOSE, CAPILLARY - Abnormal; Notable for the following:    Glucose-Capillary 239 (*)    All other components within normal limits  GLUCOSE, CAPILLARY - Abnormal; Notable for the following:    Glucose-Capillary 55 (*)    All other components within normal limits  GLUCOSE, CAPILLARY - Abnormal; Notable for the following:    Glucose-Capillary 163 (*)    All other components within normal limits  GLUCOSE, CAPILLARY - Abnormal; Notable for the following:    Glucose-Capillary 160 (*)    All other components within normal limits  BASIC METABOLIC PANEL - Abnormal; Notable for the following:    Glucose, Bld 62 (*)    BUN 61 (*)    Creatinine, Ser 1.87 (*)    Calcium 8.1 (*)    GFR calc non Af Amer 24 (*)    GFR calc Af Amer 28 (*)    All other components within normal limits  MAGNESIUM - Abnormal; Notable for the following:    Magnesium 2.7 (*)    All other components within normal limits  GLUCOSE, CAPILLARY - Abnormal; Notable for the following:    Glucose-Capillary 153 (*)  All other components within normal limits  GLUCOSE, CAPILLARY - Abnormal; Notable for the following:    Glucose-Capillary 55 (*)    All other components within normal limits  GLUCOSE, CAPILLARY - Abnormal; Notable for the following:    Glucose-Capillary 43 (*)    All other  components within normal limits  GLUCOSE, CAPILLARY - Abnormal; Notable for the following:    Glucose-Capillary 15 (*)    All other components within normal limits  GLUCOSE, CAPILLARY - Abnormal; Notable for the following:    Glucose-Capillary 160 (*)    All other components within normal limits  GLUCOSE, CAPILLARY - Abnormal; Notable for the following:    Glucose-Capillary 102 (*)    All other components within normal limits  BASIC METABOLIC PANEL - Abnormal; Notable for the following:    Potassium 5.5 (*)    CO2 33 (*)    Glucose, Bld 105 (*)    BUN 55 (*)    Creatinine, Ser 1.74 (*)    Calcium 8.4 (*)    GFR calc non Af Amer 26 (*)    GFR calc Af Amer 30 (*)    Anion gap 4 (*)    All other components within normal limits  GLUCOSE, CAPILLARY - Abnormal; Notable for the following:    Glucose-Capillary 119 (*)    All other components within normal limits  CULTURE, BLOOD (ROUTINE X 2)  URINE CULTURE  MRSA PCR SCREENING  CULTURE, BLOOD (ROUTINE X 2)  LACTIC ACID, PLASMA  GLUCOSE, CAPILLARY  GLUCOSE, CAPILLARY  GLUCOSE, CAPILLARY  GLUCOSE, CAPILLARY    RADIOLOGY Images were viewed by me  Chest x-ray  ____________________________________________  FINAL ASSESSMENT AND PLAN  Weakness  Plan: Patient with labs and imaging as dictated above. Labs are pending at the time my checkout. Patient was checked out to Dr. Juliette Alcide. Currently vital signs are stable and she is without significant complaint at this time.   Emily Filbert, MD   Emily Filbert, MD 11/13/14 [phone]

## 2014-11-09 NOTE — ED Notes (Signed)
Bladder Scan 0 ml

## 2014-11-09 NOTE — H&P (Signed)
[facility] [place] at [facility]   PATIENT NAME: [name]    MR#:  [id number]  DATE OF BIRTH:  May 17, 1931  DATE OF ADMISSION:  11/09/2014  PRIMARY CARE PHYSICIAN: Fidel Levy, MD   REQUESTING/REFERRING PHYSICIAN: dr Darnelle Catalan  CHIEF COMPLAINT:  My kidney numbers are high  HISTORY OF PRESENT ILLNESS:  [name]  is a 81 y.o. female with a known history of chronic systolic congestive heart failure with EF of around 40%, type 2 diabetes insulin-requiring, 6 coronary artery disease status post CABG, paroxysmal A. fib on all this, history of recurrent PE UTI recently treated with Invanz for ESBL. Comes to the emergency room after she was found on routine blood work at [facility] feels that her creatinine . baseline creatinine is around 1.8. Her creatinine today is 2.68. She has potassium of 5.4. Patient is on by mouth torsemide and by mouth potassium as part of her routine medicine. Patient denies any symptoms however does say she has not been drinking more fluids recently. She is overall otherwise feeling okay. Patient is NOT in congestive heart failure. Patient is being admitted for acute on chronic renal failure partly secondary to prerenal azotemia poor by mouth intake/dehydration.  PAST MEDICAL HISTORY:   Past Medical History  Diagnosis Date  . DM2 (diabetes mellitus, type 2)     onset age 65 insulin dependent  . Hyperlipidemia   . COPD (chronic obstructive pulmonary disease)   . Peripheral vascular disease   . CAD (coronary artery disease)     a. MI 1988 s/p CABG in 1988, multiple PCI on SVGs; b. cath 2012: occluded native coronary arteries, patent LIMA to LAD (distal LAD dz), patent SVG-OM & occluded SVG-RCA which filled via left to right collats; c. cath 12/2013 patent LIMA-LAD & SVG-O. known occluded VG-RCA w/ L-R collats, no change since cath 2012  . Chronic systolic CHF (congestive heart failure)     a. 03/2012: EF 40-45%, mild lateral  wall HK, mod inf HK, mod post wall HK, mild LVH, mild MR/TR   . Paroxysmal atrial fibrillation     a. CHADSVASc 7: yearly risk of CVA 9.6%;. b. on Eliquis 2.5 mg bid (age, SCr, borderline wt 62.9 Kg)   . HTN (hypertension)   . Yeast infection   . UTI (urinary tract infection)     Recurrent ESBL E.coli UTI  . Atherosclerosis of native artery of left lower extremity   . Myocardial infarction   . CKD (chronic kidney disease) stage 3, GFR 30-59 ml/min   . Hypoxia     on nocturnal o2    PAST SURGICAL HISTOIRY:   Past Surgical History  Procedure Laterality Date  . Abdominal hysterectomy    . Coronary artery bypass graft    . Appendectomy    . Cholecystectomy    . Ovary surgery    . Angioplasty / stenting femoral      Bilateral SFA stenting  . Femur surgery    . Cardiac catheterization      [facility]  . Cardiac catheterization  12/2013  . Right hip replacement    . Total knee arthroplasty      right knee    SOCIAL HISTORY:   Social History  Substance Use Topics  . Smoking status: Former Smoker -- 15 years    Types: Cigarettes    Quit date: 02/18/1988  . Smokeless tobacco: Never Used  . Alcohol Use: No    FAMILY HISTORY:   Family  History  Problem Relation Age of Onset  . Diabetes Mother   . Heart disease Mother   . Hypertension Mother   . Heart disease Father   . Heart disease Brother   . CVA Father     DRUG ALLERGIES:   Allergies  Allergen Reactions  . Contrast Media [Iodinated Diagnostic Agents] Shortness Of Breath  . Tramadol Nausea Only  . Valium [Diazepam] Other (See Comments)    Reaction:  Elevated heart rate  . Iodine Strong [Iodine] Rash  . Penicillins Rash    REVIEW OF SYSTEMS:  Review of Systems  Constitutional: Negative for fever, chills and weight loss.  HENT: Negative for ear discharge, ear pain and nosebleeds.   Eyes: Negative for blurred vision, pain and discharge.  Respiratory: Negative for sputum production, shortness of breath, wheezing and  stridor.   Cardiovascular: Negative for chest pain, palpitations, orthopnea and PND.  Gastrointestinal: Negative for nausea, vomiting, abdominal pain and diarrhea.  Genitourinary: Negative for urgency and frequency.  Musculoskeletal: Negative for back pain and joint pain.  Neurological: Positive for weakness. Negative for sensory change, speech change and focal weakness.  Psychiatric/Behavioral: Negative for depression and hallucinations. The patient is not nervous/anxious.   All other systems reviewed and are negative.    MEDICATIONS AT HOME:   Prior to Admission medications   Medication Sig Start Date End Date Taking? Authorizing Provider  acidophilus (RISAQUAD) CAPS capsule Take 1 capsule by mouth 2 (two) times daily. 10/24/14  Yes Katharina Caper, MD  albuterol (PROVENTIL) (2.5 MG/3ML) 0.083% nebulizer solution Take 3 mLs (2.5 mg total) by nebulization every 6 (six) hours as needed for wheezing or shortness of breath. 09/12/14  Yes [name] Rusty Aus, NP  amiodarone (PACERONE) 200 MG tablet Take 1 tablet (200 mg total) by mouth daily. 02/15/14  Yes Iran Ouch, MD  apixaban (ELIQUIS) 2.5 MG TABS tablet Take 1 tablet (2.5 mg total) by mouth 2 (two) times daily. 05/08/14  Yes Antonieta Iba, MD  benazepril (LOTENSIN) 20 MG tablet Take 20 mg by mouth daily.   Yes Historical Provider, MD  carvedilol (COREG) 3.125 MG tablet Take 1 tablet (3.125 mg total) by mouth 2 (two) times daily. 02/15/14  Yes Iran Ouch, MD  Cranberry 1000 MG CAPS Take 1 capsule (1,000 mg total) by mouth daily. 10/24/14  Yes Katharina Caper, MD  docusate sodium (COLACE) 100 MG capsule Take 1 capsule (100 mg total) by mouth 2 (two) times daily. 09/30/14  Yes Srikar Sudini, MD  gabapentin (NEURONTIN) 400 MG capsule Take 400 mg by mouth 3 (three) times daily.   Yes Historical Provider, MD  HYDROcodone-acetaminophen (NORCO/VICODIN) 5-325 MG per tablet Take 1-2 tablets by mouth 2 (two) times daily as needed for moderate pain.    Yes Historical Provider, MD  insulin lispro (HUMALOG) 100 UNIT/ML injection Inject 0.04 mLs (4 Units total) into the skin 3 (three) times daily before meals. Patient taking differently: Inject 10 Units into the skin 3 (three) times daily before meals.  06/18/14  Yes Alford Highland, MD  isosorbide mononitrate (IMDUR) 60 MG 24 hr tablet Take 90 mg by mouth daily.    Yes Historical Provider, MD  LANTUS SOLOSTAR 100 UNIT/ML Solostar Pen Inject 32 Units into the skin at bedtime.  09/15/14  Yes Historical Provider, MD  levothyroxine (SYNTHROID, LEVOTHROID) 100 MCG tablet Take 100 mcg by mouth daily.    Yes Historical Provider, MD  lubiprostone (AMITIZA) 24 MCG capsule Take 24 mcg by mouth 2 (two) times  daily with a meal.   Yes Historical Provider, MD  nitroGLYCERIN (NITROSTAT) 0.4 MG SL tablet Place 1 tablet (0.4 mg total) under the tongue every 5 (five) minutes as needed for chest pain. 06/18/14  Yes Alford Highland, MD  polyethylene glycol [facility] / GLYCOLAX) packet Take 17 g by mouth daily as needed for mild constipation or moderate constipation. 09/30/14  Yes Srikar Sudini, MD  potassium chloride (K-DUR) 10 MEQ tablet Take 10 mEq by mouth daily.    Yes Historical Provider, MD  pravastatin (PRAVACHOL) 40 MG tablet Take 1 tablet (40 mg total) by mouth daily. 10/03/14  Yes [name] Rusty Aus, NP  torsemide (DEMADEX) 20 MG tablet Take 40 mg in the morning and 20 mg in the afternoon. Patient taking differently: Take 20 mg by mouth 2 (two) times daily.  09/11/14  Yes Iran Ouch, MD      VITAL SIGNS:  Blood pressure 106/47, pulse 52, temperature 97.5 F (36.4 C), temperature source Oral, resp. rate 9, height  (1.549 m), weight 82.2 kg (181 lb 3.5 oz), SpO2 100 %.  PHYSICAL EXAMINATION:  GENERAL:  81 y.o.-year-old patient lying in the bed with no acute distress.  EYES: Pupils equal, round, reactive to light and accommodation. No scleral icterus. Extraocular muscles intact.  HEENT: Head atraumatic,  normocephalic. Oropharynx and nasopharynx clear.  NECK:  Supple, no jugular venous distention. No thyroid enlargement, no tenderness.  LUNGS: Decreased breath sounds bilaterally, no wheezing, rales,rhonchi or crepitation. No use of accessory muscles of respiration.  CARDIOVASCULAR: S1, S2 normal. No murmurs, rubs, or gallops.  ABDOMEN: Soft, nontender, nondistended. Bowel sounds present. No organomegaly or mass. Mild redness over the perineal area EXTREMITIES:  Chronic pedal edema, cyanosis, or clubbing.  blister over the left tibial shin.  NEUROLOGIC: Cranial nerves II through XII are intact. Muscle strength 5/5 in all extremities. Sensation intact. Gait not checked.  PSYCHIATRIC: The patient is alert and oriented x 3.  SKIN: No obvious rash, lesion, or ulcer.   LABORATORY PANEL:   CBC  Recent Labs Lab 11/09/14 1434  WBC 5.5  HGB 9.6*  HCT 31.8*  PLT 255   ------------------------------------------------------------------------------------------------------------------  Chemistries   Recent Labs Lab 11/09/14 1434  NA 137  K 5.4*  CL 97*  CO2 30  GLUCOSE 181*  BUN 65*  CREATININE 2.68*  CALCIUM 8.5*  AST 19  ALT 9*  ALKPHOS 78  BILITOT 0.5   ------------------------------------------------------------------------------------------------------------------  Cardiac Enzymes  Recent Labs Lab 11/09/14 1434  TROPONINI 0.05*   ------------------------------------------------------------------------------------------------------------------  RADIOLOGY:  Dg Chest Port 1 View  11/09/2014   CLINICAL DATA:  Sepsis.  Hypoxia.  EXAM: PORTABLE CHEST 1 VIEW  COMPARISON:  October 18, 2014.  FINDINGS: Stable cardiomegaly. Status post coronary artery bypass graft. No pneumothorax is noted. Left basilar opacity is noted concerning for pneumonia or atelectasis with associated pleural effusion. Mild right basilar opacity is noted concerning for subsegmental atelectasis or mild pleural  effusion. Bony thorax is unremarkable.  IMPRESSION: Left basilar opacity is noted concerning for pneumonia or atelectasis with associated pleural effusion. Mild right basilar opacity is noted concerning for subsegmental atelectasis or pleural effusion.   Electronically Signed   By: Lupita Raider, M.D.   On: 11/09/2014 14:58    EKG:   RBBB, wide QRS IMPRESSION AND PLAN:  81 year old Miss Baquera with past medical history of chronic systolic congestive heart failure, DJD with patient being bedbound for last 6 months, type 2 diabetes, hypertension, A. fib on a liquid  comes to the emergency room after she was found to have elevated creatinine on her routine lab work that was done at her nursing home. Patient is being admitted with  1. Acute on chronic renal failure. It appears to be present as well as ischemia. Patient appears clinically mildly dehydrated. She has not been drinking well. Will admit to medical floor. IV fluids at low rate to avoid congestive heart failure and volume overload. Avoid nephrotoxins.   I's and O's. Monitor creatinine and potassium.   I'll hold off on 2 torsemide and potassium by mouth which are patient's home meds. Resume once clinically stable. Adjust dosage on torsemide if needed. Nephrology consultation.  2. Chronic congestive heart failure stable patient clinically not in heart failure.   will continue her cardiac meds. I'll hold off on torsemide at present.  3. Bilateral lower extremity chronic leg edema with small fluid-filled blister on the left lower extremity continue to monitor. Patient is bedbound and tends to have dependent edema.  4. CAD status post CABG stable  5. History of paroxysmal A. fib on liquids will continue that.  6. Hyperlipidemia continue statins  All the records are reviewed and case discussed with ED provider. Management plans discussed with the patient, family and they are in agreement.  CODE STATUS: Full   TOTAL TIME TAKING  CARE OF THIS PATIENT:50nutes.    PATEL,SONA M.D on 11/09/2014 at 5:15 PM  Between 7am to 6pm - Pager - [phone]  After 6pm go to [email or web address] - password [email or web address] [facility]  [facility]  Office  [phone]  CC: Primary care physician; Fidel Levy, MD

## 2014-11-09 NOTE — ED Provider Notes (Signed)
Patient signed out to me by Dr. Mayford Knife. Patient waiting for labs. Labs returned patient has a normal white count creatinine and potassium and BUN have gone up considerably. I examined the patient patient reports she feels well she is not coughing she does not have a fever is not short of breath. Patient does have decreased breath sounds in the lung bases bilaterally. Patient has weeping edema of the legs. Patient has redness and swelling of the vulva this is not tender not warm not firm appears to be a combination of edema and irritation from the diaper. Dr. Allena Katz will see the patient she is aware of this. Patient has no objective signs of sepsis at this time. The chest x-ray changes appear to have been present on September 2 this year on the CT scan. There are somewhat worse now though. We'll plan to have the patient admitted with the diagnoses of acute on chronic renal failure and congestive heart failure  Arnaldo Natal, MD 11/09/14 [phone]

## 2014-11-09 NOTE — ED Notes (Signed)
MD Darnelle Catalan made aware of troponin 0.05

## 2014-11-10 ENCOUNTER — Inpatient Hospital Stay: Payer: Medicare Other | Admitting: Family Medicine

## 2014-11-10 LAB — GLUCOSE, CAPILLARY
GLUCOSE-CAPILLARY: 146 mg/dL — AB (ref 65–99)
Glucose-Capillary: 120 mg/dL — ABNORMAL HIGH (ref 65–99)
Glucose-Capillary: 243 mg/dL — ABNORMAL HIGH (ref 65–99)
Glucose-Capillary: 239 mg/dL — ABNORMAL HIGH (ref 65–99)

## 2014-11-10 LAB — BASIC METABOLIC PANEL
Anion gap: 6 (ref 5–15)
BUN: 65 mg/dL — AB (ref 6–20)
CHLORIDE: 101 mmol/L (ref 101–111)
CO2: 30 mmol/L (ref 22–32)
CREATININE: 2.5 mg/dL — AB (ref 0.44–1.00)
Calcium: 8 mg/dL — ABNORMAL LOW (ref 8.9–10.3)
GFR calc Af Amer: 20 mL/min — ABNORMAL LOW (ref >60)
GFR calc non Af Amer: 17 mL/min — ABNORMAL LOW (ref >60)
Glucose, Bld: 171 mg/dL — ABNORMAL HIGH (ref 65–99)
POTASSIUM: 5.5 mmol/L — AB (ref 3.5–5.1)
Sodium: 137 mmol/L (ref 135–145)

## 2014-11-10 MED ORDER — HYDROCODONE-ACETAMINOPHEN 5-325 MG PO TABS
1.0000 | ORAL_TABLET | Freq: Four times a day (QID) | ORAL | Status: DC | PRN
  Administered 2014-11-10 – 2014-11-15 (×12): 2 via ORAL
  Administered 2014-11-16: 1 via ORAL
  Administered 2014-11-16 – 2014-11-17 (×2): 2 via ORAL
  Administered 2014-11-19 – 2014-11-23 (×7): 1 via ORAL
  Administered 2014-11-24 – 2014-11-25 (×2): 2 via ORAL
  Administered 2014-11-25: 1 via ORAL
  Administered 2014-11-26 – 2014-11-27 (×3): 2 via ORAL
  Administered 2014-11-28 (×2): 1 via ORAL
  Administered 2014-11-29: 2 via ORAL
  Filled 2014-11-10 (×3): qty 2
  Filled 2014-11-10: qty 1
  Filled 2014-11-10 (×2): qty 2
  Filled 2014-11-10: qty 1
  Filled 2014-11-10 (×3): qty 2
  Filled 2014-11-10 (×2): qty 1
  Filled 2014-11-10 (×3): qty 2
  Filled 2014-11-10: qty 1
  Filled 2014-11-10: qty 2
  Filled 2014-11-10: qty 1
  Filled 2014-11-10 (×4): qty 2
  Filled 2014-11-10 (×6): qty 1
  Filled 2014-11-10 (×5): qty 2

## 2014-11-10 MED ORDER — MORPHINE SULFATE (PF) 2 MG/ML IV SOLN
2.0000 mg | Freq: Once | INTRAVENOUS | Status: AC
  Administered 2014-11-10: 2 mg via INTRAVENOUS
  Filled 2014-11-10: qty 1

## 2014-11-10 MED ORDER — METAXALONE 800 MG PO TABS
400.0000 mg | ORAL_TABLET | Freq: Once | ORAL | Status: AC
  Administered 2014-11-10: 11:00:00 400 mg via ORAL
  Filled 2014-11-10: qty 0.5

## 2014-11-10 NOTE — Clinical Documentation Improvement (Addendum)
Internal Medicine  Can the diagnosis of bedbound be further specified?   Function Quadriplegia, including suspected or known cause and/or associated condition(s)  Functional Quadriparesis, including suspected or known cause and/or associated condition(s)   Other  Clinically Undetermined  Document any associated diagnoses/conditions.  Supporting Information:(As per notes) "Patient is bedbound and tends to have dependent edema "  Please exercise your independent, professional judgment when responding. A specific answer is not anticipated or expected.  Thank You, Nevin Bloodgood, RN, BSN, CCDS,Clinical Documentation Specialist:  [phone]  [phone]=[facility]- Health Information Management  Reply: Clinically undetermined at this time

## 2014-11-10 NOTE — Clinical Social Work Note (Signed)
Clinical Social Work Assessment  Patient Details  Name: [name] MRN: [id number] Date of Birth: [birth year]  Date of referral:  11/10/14               Reason for consult:  Facility Placement, Other (Comment Required) (From [facility])                Permission sought to share information with:  Chartered certified accountant granted to share information::  Yes, Verbal Permission Granted  Name::      Production manager::   [name]   Relationship::     Contact Information:     Housing/Transportation Living arrangements for the past 2 months:  [place], [place] of Information:  Patient, Power of [place], Adult Children, Other (Comment Required) (Granddaughter Engineer, civil (consulting) ) Patient Interpreter Needed:  None Criminal Activity/Legal Involvement Pertinent to Current Situation/Hospitalization:  No - Comment as needed Significant Relationships:  Adult Children Lives with:  Facility Resident, Other (Comment) (Granddaughter ) Do you feel safe going back to the place where you live?  Yes Need for family participation in patient care:  Yes (Comment)  Care giving concerns: Patient is a short term rehab resident at [facility].    Social Worker assessment / plan: Holiday representative ([name]) received consult that patient is from [place]. CSW met with patient and her son was at bedside. CSW introduced self and explained role of CSW department. Patient was alert and oriented and sitting in the bed. Patient did confirm that she is from [facility] for short term and wants to return to [facility] from the hospital. Patient reported that she was just at the hospital 2 weeks ago. Patient reported that her granddaughter [name] works at [employer] and is her HPOA. CSW contacted patient's granddaughter [name]. Per [name] patient refused SNF 2 weeks ago while she was at [facility] and then went home and realized she needed rehab. Per [name]  patient was living with her before she went to [place] for rehab. CSW discussed long term care options with granddaughter. Per [name] patient will not qualify for Medicaid and will have to pay privately for SNF.  CSW contacted [employer] at [employer]. Per [name] patient can return to [facility] when medically stable.    Employment status:  Disabled (Comment on whether or not currently receiving Disability), Retired Nurse, adult PT Recommendations:  Not assessed at this time Information / Referral to community resources:  [place]  Patient/Family's Response to care: Patient and HPOA [name] are in agreement that patient will return to [place] for rehab.   Patient/Family's Understanding of and Emotional Response to Diagnosis, Current Treatment, and Prognosis: Patient was pleasant throughout assessment and thanked CSW for visit.   Emotional Assessment Appearance:    Attitude/Demeanor/Rapport:    Affect (typically observed):  Accepting, Adaptable, Pleasant Orientation:  Oriented to Self, Oriented to Place, Oriented to  Time, Oriented to Situation Alcohol / Substance use:  Not Applicable Psych involvement (Current and /or in the community):  No (Comment)  Discharge Needs  Concerns to be addressed:  Discharge Planning Concerns Readmission within the last 30 days:  Yes Current discharge risk:  Chronically ill Barriers to Discharge:  Continued Medical Work up   [name] 11/10/2014, 4:39 PM

## 2014-11-10 NOTE — Progress Notes (Signed)
[facility] -  at [facility]   PATIENT NAME: [name]    MR#:  [id number]  DATE OF BIRTH:  [birth year]  SUBJECTIVE:  CHIEF COMPLAINT:   Chief Complaint  Patient presents with  . Urinary Tract Infection   - admitted for ARF on labs from [facility] - complains of neck pain as well on exam  REVIEW OF SYSTEMS:  Review of Systems  Constitutional: Negative for fever and chills.  Respiratory: Negative for cough, shortness of breath and wheezing.   Cardiovascular: Positive for leg swelling. Negative for chest pain and palpitations.  Gastrointestinal: Negative for nausea, vomiting, abdominal pain, diarrhea and constipation.  Genitourinary: Negative for dysuria.  Musculoskeletal: Positive for neck pain.  Neurological: Negative for dizziness, seizures and headaches.    DRUG ALLERGIES:   Allergies  Allergen Reactions  . Contrast Media [Iodinated Diagnostic Agents] Shortness Of Breath  . Tramadol Nausea Only  . Valium [Diazepam] Other (See Comments)    Reaction:  Elevated heart rate  . Iodine Strong [Iodine] Rash  . Penicillins Rash    VITALS:  Blood pressure 106/57, pulse 68, temperature 97.4 F (36.3 C), temperature source Oral, resp. rate 19, height  (1.549 m), weight 77.928 kg (171 lb 12.8 oz), SpO2 100 %.  PHYSICAL EXAMINATION:  Physical Exam  GENERAL:  81 y.o.-year-old patient lying in the bed with no acute distress.  EYES: Pupils equal, round, reactive to light and accommodation. No scleral icterus. Extraocular muscles intact.  HEENT: Head atraumatic, normocephalic. Oropharynx and nasopharynx clear.  NECK:  Supple, no jugular venous distention. No thyroid enlargement, no tenderness. significant pain of right trapezius and tender spot noted, no erythema or swelling on exam LUNGS: Normal breath sounds bilaterally, no wheezing, rales,rhonchi or crepitation. No use of accessory muscles of respiration. Decreased bibasilar breath  sounds CARDIOVASCULAR: S1, S2 normal. No murmurs, rubs, or gallops.  ABDOMEN: Soft, nontender, nondistended. Bowel sounds present. No organomegaly or mass.  EXTREMITIES: 1-2+ pedal edema, cyanosis, or clubbing.  NEUROLOGIC: Cranial nerves II through XII are intact. Muscle strength 5/5 in all extremities. Sensation intact. Gait not checked.  PSYCHIATRIC: The patient is alert and oriented x 3.  SKIN: No obvious rash, lesion, or ulcer.    LABORATORY PANEL:   CBC  Recent Labs Lab 11/09/14 1434  WBC 5.5  HGB 9.6*  HCT 31.8*  PLT 255   ------------------------------------------------------------------------------------------------------------------  Chemistries   Recent Labs Lab 11/09/14 1434 11/10/14 0535  NA 137 137  K 5.4* 5.5*  CL 97* 101  CO2 30 30  GLUCOSE 181* 171*  BUN 65* 65*  CREATININE 2.68* 2.50*  CALCIUM 8.5* 8.0*  AST 19  --   ALT 9*  --   ALKPHOS 78  --   BILITOT 0.5  --    ------------------------------------------------------------------------------------------------------------------  Cardiac Enzymes  Recent Labs Lab 11/09/14 1434  TROPONINI 0.05*   ------------------------------------------------------------------------------------------------------------------  RADIOLOGY:  Dg Chest Port 1 View  11/09/2014   CLINICAL DATA:  Sepsis.  Hypoxia.  EXAM: PORTABLE CHEST 1 VIEW  COMPARISON:  October 18, 2014.  FINDINGS: Stable cardiomegaly. Status post coronary artery bypass graft. No pneumothorax is noted. Left basilar opacity is noted concerning for pneumonia or atelectasis with associated pleural effusion. Mild right basilar opacity is noted concerning for subsegmental atelectasis or mild pleural effusion. Bony thorax is unremarkable.  IMPRESSION: Left basilar opacity is noted concerning for pneumonia or atelectasis with associated pleural effusion. Mild right basilar opacity is noted concerning for subsegmental atelectasis or pleural  effusion.    Electronically Signed   By: Lupita Raider, M.D.   On: 11/09/2014 14:58    EKG:   Orders placed or performed during the hospital encounter of 11/09/14  . ED EKG 12-Lead  . ED EKG 12-Lead  . EKG 12-Lead  . EKG 12-Lead    ASSESSMENT AND PLAN:   81 year old [name] with past medical history of chronic systolic congestive heart failure, DJD with patient being bedbound for last 6 months, type 2 diabetes, hypertension, A. fib on a liquid comes to the emergency room after she was found to have elevated creatinine on her routine lab work that was done at her nursing home.  1. Acute on chronic renal failure-Baseline creatinine 1.8, creatinine today is 2.5. -BUN is elevated than baseline so could be prerenal versus ATN. -Gentle hydration today. -Renal calcifications likely vascular noted on previous CT scans in about 3 weeks ago. -do renal ultrasound. Avoid nephrotoxins -Urine analysis with no infection. -If no improvement, will consult nephrology  2. Chronic systolic CHF-last CT scan from 3 weeks ago showing ascites and abdominal wall edema. -Has chronic bilateral pleural effusions. Currently not in any acute failure -Continue hydration gently. Diuretics on hold. -Continue to monitor closely  3. CAD status post CABG-stable. -Medications on hold due to hypotension  4. Paroxysmal atrial fibrillation-rate controlled. Coreg discontinued due to hypotension -Continue amiodarone. Also on eliquis for anticoagulation  5. Diabetes mellitus-continue Lantus and also sliding scale insulin  6. DVT prophylaxis-on eliquis  All the records are reviewed and case discussed with Care Management/Social Workerr. Management plans discussed with the patient, family and they are in agreement.  CODE STATUS: Full Code  TOTAL TIME TAKING CARE OF THIS PATIENT: 40 minutes.   POSSIBLE D/C IN 2 DAYS, DEPENDING ON CLINICAL CONDITION.   Enid Baas M.D on 11/10/2014 at 5:07 PM  Between 7am to 6pm  - Pager - [phone]  After 6pm go to [email or web address] - password [email or web address] [facility]  [place] [facility]  Office  [phone]  CC: Primary care physician; Fidel Levy, MD

## 2014-11-10 NOTE — Progress Notes (Addendum)
Initial Nutrition Assessment   INTERVENTION:   Meals and Snacks: Cater to patient preferences; Pt may benefit from Carb Modified diet order as well. Medical Food Supplement Therapy: will recommend Mighty Shakes on meal trays TID for added nutrition (each shake provides 300kcals and 9g protein) Coordination of Care: recommend Daily Weights   NUTRITION DIAGNOSIS:   Inadequate oral intake related to poor appetite as evidenced by per patient/family report.  GOAL:   Patient will meet greater than or equal to 90% of their needs  MONITOR:    (Energy Intake, Anthropometrics, Electrolyte and Renal Profile, digestive system)  REASON FOR ASSESSMENT:   Diagnosis    ASSESSMENT:   Pt admitted with acute renal failure secondary to prerenal azotemia.   Past Medical History  Diagnosis Date  . DM2 (diabetes mellitus, type 2)     onset age 73 insulin dependent  . Hyperlipidemia   . COPD (chronic obstructive pulmonary disease)   . Peripheral vascular disease   . CAD (coronary artery disease)     a. MI 1988 s/p CABG in 1988, multiple PCI on SVGs; b. cath 2012: occluded native coronary arteries, patent LIMA to LAD (distal LAD dz), patent SVG-OM & occluded SVG-RCA which filled via left to right collats; c. cath 12/2013 patent LIMA-LAD & SVG-O. known occluded VG-RCA w/ L-R collats, no change since cath 2012  . Chronic systolic CHF (congestive heart failure)     a. 03/2012: EF 40-45%, mild lateral wall HK, mod inf HK, mod post wall HK, mild LVH, mild MR/TR   . Paroxysmal atrial fibrillation     a. CHADSVASc 7: yearly risk of CVA 9.6%;. b. on Eliquis 2.5 mg bid (age, SCr, borderline wt 62.9 Kg)   . HTN (hypertension)   . Yeast infection   . UTI (urinary tract infection)     Recurrent ESBL E.coli UTI  . Atherosclerosis of native artery of left lower extremity   . Myocardial infarction   . CKD (chronic kidney disease) stage 3, GFR 30-59 ml/min   . Hypoxia     on nocturnal o2    Diet Order:   Diet 2 gram sodium Room service appropriate?: Yes; Fluid consistency:: Thin    Current Nutrition: Pt ate grits this am but nothing else from tray.   Food/Nutrition-Related History: Pt reports eating usually one meal per day PTA sometimes something light for lunch. Pt reports hardly eating anything for dinner for the past month. Pt reports poor appetite and early satiety.   Medications: colace, novolog, lantus, NS at 1mL/hr  Electrolyte/Renal Profile and Glucose Profile:   Recent Labs Lab 11/09/14 1434 11/10/14 0535  NA 137 137  K 5.4* 5.5*  CL 97* 101  CO2 30 30  BUN 65* 65*  CREATININE 2.68* 2.50*  CALCIUM 8.5* 8.0*  GLUCOSE 181* 171*   Protein Profile:  Recent Labs Lab 11/09/14 1434  ALBUMIN 3.3*    Gastrointestinal Profile: Last BM: 11/09/2014   Nutrition-Focused Physical Exam Findings: Nutrition-Focused physical exam completed. Findings are WDL for fat depletion, muscle depletion, and 2+ edema.     Weight Change: Pt reports weight of 135lbs one year ago. Pt reports weight gain because of fluid hard to say what current dry weight is now. RD weighed pt on visit 171.8lbs.   Height:   Ht Readings from Last 1 Encounters:  11/09/14  (1.549 m)    Weight:   Wt Readings from Last 1 Encounters:  11/09/14 181 lb 3.5 oz (82.2 kg)    Wt  Readings from Last 10 Encounters:  11/10/14 171 lb 12.8 oz (77.928 kg)  10/19/14 156 lb 4.8 oz (70.897 kg)  09/30/14 149 lb 12.8 oz (67.949 kg)  09/22/14 141 lb (63.957 kg)  09/01/14 149 lb 4.8 oz (67.722 kg)  08/22/14 142 lb (64.411 kg)  08/13/14 152 lb 3.2 oz (69.037 kg)  08/08/14 140 lb (63.504 kg)  07/25/14 140 lb (63.504 kg)  07/10/14 140 lb (63.504 kg)    Ideal Body Weight:    48kg  BMI:  Body mass index is 34.26 kg/(m^2).  Estimated Nutritional Needs:   Kcal:  using IBW of 48kg, BEE: 882kcals, TEE: (IF 1.0-1.2)(AF 1.3) 1146-1490kcals  Protein:  using IBW of 48kg, 48-58g protein (1.0-1.2g/kg)  Fluid:   using IBW of 48kg, (25-79mL/kg) 1440-1629mL/kg  EDUCATION NEEDS:   No education needs identified at this time   MODERATE Care Level  Leda Quail, RD, LDN Pager [phone]

## 2014-11-10 NOTE — Plan of Care (Signed)
Problem: Discharge Progression Outcomes Goal: Discharge plan in place and appropriate Individualization of care Lives at [facility] nursing facility, uses a wheelchair at facility. Likes to be [place] precautions per policy, offer toileting during hourly rounds. Has history of COPD, CHF, HTN, diabetes, hyperlipidemia, CAD and A-fib controlled by medications.    Goal: Other Discharge Outcomes/Goals Plan of care progress to goal: - No complaints of pain. - Continues on IV fluids. - Incontinent at times. Will continue to monitor.

## 2014-11-10 NOTE — Care Management (Signed)
Admitted to [facility] with the diagnosis of renal failure. Discharged from this facility to home 10/24/14. Granddaughter is [name] [phone]). Followed by Genevieve Norlander in the home. Chronic [facility] oxygen thru [facility] Dr. Juanetta Gosling is primary care physician. Dr. Juanetta Gosling signed [facility] for [facility] 10/30/14. Uses a wheelchair to aid in ambulation at [facility].  BUN 65, Creatinine 2.50,  Gwenette Greet RN MSN Care Management [phone]

## 2014-11-10 NOTE — Progress Notes (Signed)
   11/10/14 0915  Clinical Encounter Type  Visited With Patient and family together  Visit Type Initial  Referral From Nurse  Consult/Referral To Chaplain  Spiritual Encounters  Spiritual Needs Prayer  Stress Factors  Patient Stress Factors None identified  Provided pastoral presence, support, and prayer

## 2014-11-10 NOTE — Plan of Care (Signed)
Problem: Discharge Progression Outcomes Goal: Other Discharge Outcomes/Goals Outcome: Progressing Plan of care progress to goal for: 1. Pain-pt c/o neck and shoulder pain, prn meds given with improvement 2. Hemodynamically-             -VSS, pt remains afebrile this shift             -IV fluids continue per orders 3. Complications-no evidence this shift 4. Diet-pt tolerating diet this shift 5. Activity-pt has been bed bound for the past 6 mos.

## 2014-11-11 ENCOUNTER — Inpatient Hospital Stay: Payer: Medicare Other

## 2014-11-11 LAB — URINE CULTURE: Culture: >=100000

## 2014-11-11 LAB — BASIC METABOLIC PANEL
Anion gap: 6 (ref 5–15)
BUN: 64 mg/dL — AB (ref 6–20)
CHLORIDE: 101 mmol/L (ref 101–111)
CO2: 33 mmol/L — AB (ref 22–32)
Calcium: 8.3 mg/dL — ABNORMAL LOW (ref 8.9–10.3)
Creatinine, Ser: 2.3 mg/dL — ABNORMAL HIGH (ref 0.44–1.00)
GFR calc Af Amer: 22 mL/min — ABNORMAL LOW (ref >60)
GFR calc non Af Amer: 19 mL/min — ABNORMAL LOW (ref >60)
GLUCOSE: 86 mg/dL (ref 65–99)
POTASSIUM: 5.1 mmol/L (ref 3.5–5.1)
Sodium: 140 mmol/L (ref 135–145)

## 2014-11-11 LAB — GLUCOSE, CAPILLARY
GLUCOSE-CAPILLARY: 72 mg/dL (ref 65–99)
GLUCOSE-CAPILLARY: 89 mg/dL (ref 65–99)
GLUCOSE-CAPILLARY: 160 mg/dL — AB (ref 65–99)
GLUCOSE-CAPILLARY: 153 mg/dL — AB (ref 65–99)
Glucose-Capillary: 55 mg/dL — ABNORMAL LOW (ref 65–99)
Glucose-Capillary: 163 mg/dL — ABNORMAL HIGH (ref 65–99)

## 2014-11-11 MED ORDER — SODIUM CHLORIDE 0.9 % IV SOLN
1.0000 g | INTRAVENOUS | Status: DC
  Filled 2014-11-11 (×2): qty 1

## 2014-11-11 MED ORDER — ALUM & MAG HYDROXIDE-SIMETH 200-200-20 MG/5ML PO SUSP
30.0000 mL | Freq: Four times a day (QID) | ORAL | Status: DC | PRN
  Administered 2014-11-11 – 2014-11-26 (×12): 30 mL via ORAL
  Filled 2014-11-11 (×12): qty 30

## 2014-11-11 MED ORDER — SODIUM CHLORIDE 0.9 % IV SOLN
500.0000 mg | INTRAVENOUS | Status: DC
  Administered 2014-11-11 – 2014-11-21 (×11): 0.5 g via INTRAVENOUS
  Filled 2014-11-11 (×13): qty 0.5

## 2014-11-11 NOTE — Plan of Care (Signed)
Problem: Discharge Progression Outcomes Goal: Other Discharge Outcomes/Goals Outcome: Progressing Plan of care progress to goal for: 1. Pain-pt c/o neck and shoulder pain, prn meds given with improvement 2. Hemodynamically-             -VSS, pt remains afebrile this shift             -IV fluids continue per orders             -IV ABT started for ESBL in urine 3. Complications-no evidence this shift 4. Diet-pt tolerating diet this shift 5. Activity-pt has been bed bound for the past 6 mos.

## 2014-11-11 NOTE — Progress Notes (Signed)
[facility] - [facility]osed Phys[facility] - [name] at [facility]   PATIENT NAME: [name]    MR#:  [id number]  DATE OF BIRTH:  07-09-1931  SUBJECTIVE:  CHIEF COMPLAINT:   Chief Complaint  Patient presents with  . Urinary Tract Infection   - complains of myoclonic jerks of upper extremities and decreased strength - neck pain has improved - receiving IV fluids for ARF   REVIEW OF SYSTEMS:  Review of Systems  Constitutional: Negative for fever and chills.  Respiratory: Negative for cough, shortness of breath and wheezing.   Cardiovascular: Positive for leg swelling. Negative for chest pain and palpitations.  Gastrointestinal: Negative for nausea, vomiting, abdominal pain, diarrhea and constipation.  Genitourinary: Negative for dysuria.  Musculoskeletal: Positive for myalgias and neck pain.  Neurological: Positive for tremors. Negative for dizziness, seizures and headaches.    DRUG ALLERGIES:   Allergies  Allergen Reactions  . Contrast Media [Iodinated Diagnostic Agents] Shortness Of Breath  . Tramadol Nausea Only  . Valium [Diazepam] Other (See Comments)    Reaction:  Elevated heart rate  . Iodine Strong [Iodine] Rash  . Penicillins Rash    VITALS:  Blood pressure 114/52, pulse 60, temperature 97.8 F (36.6 C), temperature source Oral, resp. rate 20, height  (1.549 m), weight 79.697 kg (175 lb 11.2 oz), SpO2 100 %.  PHYSICAL EXAMINATION:  Physical Exam  GENERAL:  81 y.o.-year-old patient lying in the bed with no acute distress.  EYES: Pupils equal, round, reactive to light and accommodation. No scleral icterus. Extraocular muscles intact.  HEENT: Head atraumatic, normocephalic. Oropharynx and nasopharynx clear.  NECK:  Supple, no jugular venous distention. No thyroid enlargement, no tenderness. significant pain of right trapezius and tender spot noted, no erythema or swelling on exam LUNGS: Normal breath sounds bilaterally, no wheezing, rales,rhonchi or  crepitation. No use of accessory muscles of respiration. Decreased bibasilar breath sounds CARDIOVASCULAR: S1, S2 normal. No murmurs, rubs, or gallops.  ABDOMEN: Soft, nontender, nondistended. Bowel sounds present. No organomegaly or mass.  EXTREMITIES: 1-2+ pedal edema, cyanosis, or clubbing.  NEUROLOGIC: Cranial nerves II through XII are intact. Muscle strength 5/5 in all extremities. Upper extremities- proximal and distal muscles strength tested- normal, some myoclonic jerks noted prominent on left arm only when arms are above head level Sensation intact. Gait not checked.  PSYCHIATRIC: The patient is alert and oriented x 3.  SKIN: No obvious rash, lesion, or ulcer.    LABORATORY PANEL:   CBC  Recent Labs Lab 11/09/14 1434  WBC 5.5  HGB 9.6*  HCT 31.8*  PLT 255   ------------------------------------------------------------------------------------------------------------------  Chemistries   Recent Labs Lab 11/09/14 1434  11/11/14 0517  NA 137  < > 140  K 5.4*  < > 5.1  CL 97*  < > 101  CO2 30  < > 33*  GLUCOSE 181*  < > 86  BUN 65*  < > 64*  CREATININE 2.68*  < > 2.30*  CALCIUM 8.5*  < > 8.3*  AST 19  --   --   ALT 9*  --   --   ALKPHOS 78  --   --   BILITOT 0.5  --   --   < > = values in this interval not displayed. ------------------------------------------------------------------------------------------------------------------  Cardiac Enzymes  Recent Labs Lab 11/09/14 1434  TROPONINI 0.05*   ------------------------------------------------------------------------------------------------------------------  RADIOLOGY:  US Renal  11/11/2014   CLINICAL DATA:  Acute renal failure  EXAM: RENAL / URINARY TRACT ULTRASOUND COMPLETE  COMPARISON:  CT 10/20/2014  FINDINGS: Right Kidney:  Length: 10.8 cm. Slightly increased echotexture. No mass or hydronephrosis visualized.  Left Kidney:  Length: 8.9 cm. Increased echotexture. No mass or hydronephrosis visualized.   Bladder:  Appears normal for degree of bladder distention.  Ascites noted in the abdomen adjacent to the liver and spleen as seen on recent CT  IMPRESSION: Increased echotexture within the kidneys bilaterally suggesting chronic medical renal disease. No hydronephrosis.  Ascites.   Electronically Signed   By: Charlett Nose M.D.   On: 11/11/2014 09:48   Dg Chest Port 1 View  11/09/2014   CLINICAL DATA:  Sepsis.  Hypoxia.  EXAM: PORTABLE CHEST 1 VIEW  COMPARISON:  October 18, 2014.  FINDINGS: Stable cardiomegaly. Status post coronary artery bypass graft. No pneumothorax is noted. Left basilar opacity is noted concerning for pneumonia or atelectasis with associated pleural effusion. Mild right basilar opacity is noted concerning for subsegmental atelectasis or mild pleural effusion. Bony thorax is unremarkable.  IMPRESSION: Left basilar opacity is noted concerning for pneumonia or atelectasis with associated pleural effusion. Mild right basilar opacity is noted concerning for subsegmental atelectasis or pleural effusion.   Electronically Signed   By: Lupita Raider, M.D.   On: 11/09/2014 14:58    EKG:   Orders placed or performed during the hospital encounter of 11/09/14  . ED EKG 12-Lead  . ED EKG 12-Lead  . EKG 12-Lead  . EKG 12-Lead    ASSESSMENT AND PLAN:   81 year old Miss [name] with past medical history of chronic systolic congestive heart failure, DJD with patient being bedbound for last 6 months, type 2 diabetes, hypertension, A. fib on a liquid comes to the emergency room after she was found to have elevated creatinine on her routine lab work that was done at her nursing home.  1. Acute on chronic renal failure-Baseline creatinine 1.8, creatinine today is 2.3. -BUN is elevated than baseline so could be prerenal versus ATN. -Gentle hydration. -Renal calcifications likely vascular noted on previous CT scans in about 3 weeks ago. - renal ultrasound. Avoid nephrotoxins  2. ESBL UTI- from  cultures - started invanz and also started contact isolation  2. Chronic systolic CHF-last CT scan from 3 weeks ago showing ascites and abdominal wall edema. -Has chronic bilateral pleural effusions. Currently not in any acute failure -Continue hydration gently. Diuretics on hold. -Continue to monitor closely  3. CAD status post CABG-stable. -Medications on hold due to hypotension  4. Paroxysmal atrial fibrillation-rate controlled. Coreg discontinued due to hypotension -Continue amiodarone. Also on eliquis for anticoagulation  5. Diabetes mellitus-continue Lantus and also sliding scale insulin  6. DVT prophylaxis-on eliquis  7. Myoclonic jerks- didn't see any meds that would cause that - physical therapy recommended for generalised weakness - check potassium and calcium levels again tomorrow  All the records are reviewed and case discussed with Care Management/Social Workerr. Management plans discussed with the patient, family and they are in agreement.  CODE STATUS: Full Code  TOTAL TIME TAKING CARE OF THIS PATIENT: 40 minutes.   POSSIBLE D/C IN 2 DAYS, DEPENDING ON CLINICAL CONDITION.   Enid Baas M.D on 11/11/2014 at 12:24 PM  Between 7am to 6pm - Pager - [phone]  After 6pm go to [email or web address] - password [email or web address] [facility]  [place] [facility]  Office  [phone]  CC: Primary care physician; Fidel Levy, MD

## 2014-11-11 NOTE — Progress Notes (Signed)
ANTIBIOTIC CONSULT NOTE - INITIAL  Pharmacy Consult for Ertapenem Indication: UTI (E Coli)  Allergies  Allergen Reactions  . Contrast Media [Iodinated Diagnostic Agents] Shortness Of Breath  . Tramadol Nausea Only  . Valium [Diazepam] Other (See Comments)    Reaction:  Elevated heart rate  . Iodine Strong [Iodine] Rash  . Penicillins Rash    Patient Measurements: Height:  (154.9 cm) Weight: 175 lb 11.2 oz (79.697 kg) IBW/kg (Calculated) : 47.8 Adjusted Body Weight:   Vital Signs: Temp: 97.8 F (36.6 C) (09/24 0510) Temp Source: Oral (09/24 0510) BP: 114/52 mmHg (09/24 0510) Pulse Rate: 60 (09/24 0510) Intake/Output from previous day: 09/23 0701 - 09/24 0700 In: -  Out: 1 [Urine:1] Intake/Output from this shift:    Labs:  Recent Labs  11/09/14 1434 11/10/14 0535 11/11/14 0517  WBC 5.5  --   --   HGB 9.6*  --   --   PLT 255  --   --   CREATININE 2.68* 2.50* 2.30*   Estimated Creatinine Clearance: 18.4 mL/min (by C-G formula based on Cr of 2.3). No results for input(s): VANCOTROUGH, VANCOPEAK, VANCORANDOM, GENTTROUGH, GENTPEAK, GENTRANDOM, TOBRATROUGH, TOBRAPEAK, TOBRARND, AMIKACINPEAK, AMIKACINTROU, AMIKACIN in the last 72 hours.   Microbiology: Recent Results (from the past 720 hour(s))  Blood culture (routine x 2)     Status: None   Collection Time: 10/17/14  9:07 AM  Result Value Ref Range Status   Specimen Description BLOOD LEFT ASSIST CONTROL  Final   Special Requests BOTTLES DRAWN AEROBIC AND ANAEROBIC  1 CC  Final   Culture NO GROWTH 5 DAYS  Final   Report Status 10/22/2014 FINAL  Final  Blood culture (routine x 2)     Status: None   Collection Time: 10/17/14  9:07 AM  Result Value Ref Range Status   Specimen Description BLOOD RIGHT ASSIST CONTROL  Final   Special Requests BOTTLES DRAWN AEROBIC AND ANAEROBIC  1 CC  Final   Culture NO GROWTH 5 DAYS  Final   Report Status 10/22/2014 FINAL  Final  Urine culture     Status: None   Collection  Time: 10/17/14  5:51 PM  Result Value Ref Range Status   Specimen Description URINE, CLEAN CATCH  Final   Special Requests NONE  Final   Culture 20,000 COLONIES/mL CANDIDA ALBICANS  Final   Report Status 10/20/2014 FINAL  Final  MRSA PCR Screening     Status: None   Collection Time: 10/17/14  5:51 PM  Result Value Ref Range Status   MRSA by PCR NEGATIVE NEGATIVE Final    Comment:        The GeneXpert MRSA Assay (FDA approved for NASAL specimens only), is one component of a comprehensive MRSA colonization surveillance program. It is not intended to diagnose MRSA infection nor to guide or monitor treatment for MRSA infections.   Urine culture     Status: None   Collection Time: 11/09/14  2:34 PM  Result Value Ref Range Status   Specimen Description URINE, CLEAN CATCH  Final   Special Requests NONE  Final   Culture   Final    >=100,000 COLONIES/mL ESCHERICHIA COLI ESBL-EXTENDED SPECTRUM BETA LACTAMASE-THE ORGANISM IS RESISTANT TO PENICILLINS, CEPHALOSPORINS AND AZTREONAM ACCORDING TO CLSI M100-S15 VOL.25 N01 JAN 2005. CRITICAL RESULT CALLED TO, READ BACK BY AND VERIFIED WITH: DANIEL JACOBS,RN 11/11/2014 0909 JRS.    Report Status 11/11/2014 FINAL  Final   Organism ID, Bacteria ESCHERICHIA COLI  Final  Susceptibility   Escherichia coli - MIC*    AMPICILLIN >=32 RESISTANT Resistant     CEFAZOLIN >=64 RESISTANT Resistant     CEFTRIAXONE >=64 RESISTANT Resistant     CIPROFLOXACIN >=4 RESISTANT Resistant     GENTAMICIN <=1 SENSITIVE Sensitive     IMIPENEM <=0.25 SENSITIVE Sensitive     NITROFURANTOIN <=16 SENSITIVE Sensitive     TRIMETH/SULFA >=320 RESISTANT Resistant     Extended ESBL POSITIVE Resistant     PIP/TAZO Value in next row Sensitive      SENSITIVE8    LEVOFLOXACIN Value in next row Resistant      RESISTANT>=8    * >=100,000 COLONIES/mL ESCHERICHIA COLI  MRSA PCR Screening     Status: None   Collection Time: 11/09/14  6:24 PM  Result Value Ref Range Status    MRSA by PCR NEGATIVE NEGATIVE Final    Comment:        The GeneXpert MRSA Assay (FDA approved for NASAL specimens only), is one component of a comprehensive MRSA colonization surveillance program. It is not intended to diagnose MRSA infection nor to guide or monitor treatment for MRSA infections.     Medical History: Past Medical History  Diagnosis Date  . DM2 (diabetes mellitus, type 2)     onset age 43 insulin dependent  . Hyperlipidemia   . COPD (chronic obstructive pulmonary disease)   . Peripheral vascular disease   . CAD (coronary artery disease)     a. MI 1988 s/p CABG in 1988, multiple PCI on SVGs; b. cath 2012: occluded native coronary arteries, patent LIMA to LAD (distal LAD dz), patent SVG-OM & occluded SVG-RCA which filled via left to right collats; c. cath 12/2013 patent LIMA-LAD & SVG-O. known occluded VG-RCA w/ L-R collats, no change since cath 2012  . Chronic systolic CHF (congestive heart failure)     a. 03/2012: EF 40-45%, mild lateral wall HK, mod inf HK, mod post wall HK, mild LVH, mild MR/TR   . Paroxysmal atrial fibrillation     a. CHADSVASc 7: yearly risk of CVA 9.6%;. b. on Eliquis 2.5 mg bid (age, SCr, borderline wt 62.9 Kg)   . HTN (hypertension)   . Yeast infection   . UTI (urinary tract infection)     Recurrent ESBL E.coli UTI  . Atherosclerosis of native artery of left lower extremity   . Myocardial infarction   . CKD (chronic kidney disease) stage 3, GFR 30-59 ml/min   . Hypoxia     on nocturnal o2    Medications:  Prescriptions prior to admission  Medication Sig Dispense Refill Last Dose  . acidophilus (RISAQUAD) CAPS capsule Take 1 capsule by mouth 2 (two) times daily. 60 capsule 4 11/09/2014 at 0800  . albuterol (PROVENTIL) (2.5 MG/3ML) 0.083% nebulizer solution Take 3 mLs (2.5 mg total) by nebulization every 6 (six) hours as needed for wheezing or shortness of breath. 150 mL 1 prn at prn  . amiodarone (PACERONE) 200 MG tablet Take 1 tablet  (200 mg total) by mouth daily. 90 tablet 3 11/09/2014 at 0800  . apixaban (ELIQUIS) 2.5 MG TABS tablet Take 1 tablet (2.5 mg total) by mouth 2 (two) times daily. 60 tablet 6 11/09/2014 at 0800  . benazepril (LOTENSIN) 20 MG tablet Take 20 mg by mouth daily.   11/09/2014 at 0800  . carvedilol (COREG) 3.125 MG tablet Take 1 tablet (3.125 mg total) by mouth 2 (two) times daily. 180 tablet 3 11/09/2014 at 0800  . Cranberry  1000 MG CAPS Take 1 capsule (1,000 mg total) by mouth daily. 30 each 12 11/09/2014 at 0800  . docusate sodium (COLACE) 100 MG capsule Take 1 capsule (100 mg total) by mouth 2 (two) times daily. 60 capsule 0 11/09/2014 at 0800  . gabapentin (NEURONTIN) 400 MG capsule Take 400 mg by mouth 3 (three) times daily.   11/09/2014 at 1400  . HYDROcodone-acetaminophen (NORCO/VICODIN) 5-325 MG per tablet Take 1-2 tablets by mouth 2 (two) times daily as needed for moderate pain.   11/09/2014 at 0730  . insulin lispro (HUMALOG) 100 UNIT/ML injection Inject 0.04 mLs (4 Units total) into the skin 3 (three) times daily before meals. (Patient taking differently: Inject 10 Units into the skin 3 (three) times daily before meals. ) 10 mL 11 11/09/2014 at Unknown time  . isosorbide mononitrate (IMDUR) 60 MG 24 hr tablet Take 90 mg by mouth daily.    11/09/2014 at 0800  . LANTUS SOLOSTAR 100 UNIT/ML Solostar Pen Inject 32 Units into the skin at bedtime.    11/08/2014 at 2000  . levothyroxine (SYNTHROID, LEVOTHROID) 100 MCG tablet Take 100 mcg by mouth daily.    11/09/2014 at 0600  . lubiprostone (AMITIZA) 24 MCG capsule Take 24 mcg by mouth 2 (two) times daily with a meal.   11/09/2014 at 0800  . nitroGLYCERIN (NITROSTAT) 0.4 MG SL tablet Place 1 tablet (0.4 mg total) under the tongue every 5 (five) minutes as needed for chest pain. 30 tablet 0 prn at prn  . polyethylene glycol (MIRALAX / GLYCOLAX) packet Take 17 g by mouth daily as needed for mild constipation or moderate constipation. 14 each 0 prn at prn  . potassium  chloride (K-DUR) 10 MEQ tablet Take 10 mEq by mouth daily.    11/09/2014 at 0800  . pravastatin (PRAVACHOL) 40 MG tablet Take 1 tablet (40 mg total) by mouth daily. 90 tablet 3 11/08/2014 at 1500  . torsemide (DEMADEX) 20 MG tablet Take 40 mg in the morning and 20 mg in the afternoon. (Patient taking differently: Take 20 mg by mouth 2 (two) times daily. ) 180 tablet 3 11/08/2014 at Unknown time   Assessment: Pt with Urine Cx now growing E. Coli. CrCl  = 18.4 ml/min  Goal of Therapy:  Resolution of infection  Plan:  Expected duration 7 days with resolution of temperature and/or normalization of WBC  Ertapenem 1 gm IV Q24H originally ordered.  Will adjust dose to Ertapenem 500 mg IV Q24H based on CrCl < 30 ml/min.   Robbins,Jason D 11/11/2014,10:45 AM

## 2014-11-12 ENCOUNTER — Inpatient Hospital Stay: Payer: Medicare Other

## 2014-11-12 LAB — MAGNESIUM: MAGNESIUM: 2.7 mg/dL — AB (ref 1.7–2.4)

## 2014-11-12 LAB — GLUCOSE, CAPILLARY
GLUCOSE-CAPILLARY: 43 mg/dL — AB (ref 65–99)
GLUCOSE-CAPILLARY: 15 mg/dL — AB (ref 65–99)
GLUCOSE-CAPILLARY: 94 mg/dL (ref 65–99)
GLUCOSE-CAPILLARY: 160 mg/dL — AB (ref 65–99)
Glucose-Capillary: 55 mg/dL — ABNORMAL LOW (ref 65–99)
Glucose-Capillary: 70 mg/dL (ref 65–99)
Glucose-Capillary: 102 mg/dL — ABNORMAL HIGH (ref 65–99)
Glucose-Capillary: 119 mg/dL — ABNORMAL HIGH (ref 65–99)

## 2014-11-12 LAB — BASIC METABOLIC PANEL
Anion gap: 5 (ref 5–15)
BUN: 61 mg/dL — AB (ref 6–20)
CALCIUM: 8.1 mg/dL — AB (ref 8.9–10.3)
CO2: 31 mmol/L (ref 22–32)
Chloride: 103 mmol/L (ref 101–111)
Creatinine, Ser: 1.87 mg/dL — ABNORMAL HIGH (ref 0.44–1.00)
GFR calc Af Amer: 28 mL/min — ABNORMAL LOW (ref >60)
GFR, EST NON AFRICAN AMERICAN: 24 mL/min — AB (ref >60)
GLUCOSE: 62 mg/dL — AB (ref 65–99)
POTASSIUM: 5.1 mmol/L (ref 3.5–5.1)
SODIUM: 139 mmol/L (ref 135–145)

## 2014-11-12 MED ORDER — NYSTATIN 100000 UNIT/GM EX CREA
TOPICAL_CREAM | Freq: Two times a day (BID) | CUTANEOUS | Status: DC
  Administered 2014-11-12 – 2014-11-20 (×17): via TOPICAL
  Administered 2014-11-20: 1 via TOPICAL
  Administered 2014-11-21 – 2014-12-04 (×23): via TOPICAL
  Filled 2014-11-12 (×3): qty 15

## 2014-11-12 MED ORDER — FUROSEMIDE 20 MG PO TABS
20.0000 mg | ORAL_TABLET | Freq: Every day | ORAL | Status: DC
  Administered 2014-11-13 – 2014-11-14 (×2): 20 mg via ORAL
  Filled 2014-11-12 (×2): qty 1

## 2014-11-12 MED ORDER — INSULIN GLARGINE 100 UNIT/ML ~~LOC~~ SOLN
22.0000 [IU] | Freq: Every day | SUBCUTANEOUS | Status: DC
Start: 2014-11-12 — End: 2014-11-23
  Administered 2014-11-12 – 2014-11-22 (×10): 22 [IU] via SUBCUTANEOUS
  Filled 2014-11-12 (×12): qty 0.22

## 2014-11-12 NOTE — Plan of Care (Signed)
Individualization of Care Pt prefers to be called [place] Hx of DM COPD, PVD, HTN, CHF, CAD, a-fib  Problem: Discharge Progression Outcomes Goal: Other Discharge Outcomes/Goals Outcome: Progressing Plan of care progress to goal for: 1. Pain: Patient c/o neck and shoulder pain, prn meds given  X 1 with relief stated upon reassessment 2. Hemodynamically:  Patient afebrile and VSS this shift.  IV antibiotics for ESBL in urine.  BP 120/55 mmHg  Pulse 72  Temp(Src) 98.9 F (37.2 C) (Oral)  Resp 18  Ht  (1.549 m)  Wt 175 lb 11.2 oz (79.697 kg)  BMI 33.22 kg/m2  SpO2 100% 3. Complications:  None this shift.  4. Diet:  Patient tolerating diet this shift 5. Activity:  Patient has been bed bound for the past 6 mos.  Hourly rounding and individualized toileting this shift.

## 2014-11-12 NOTE — Progress Notes (Signed)
Physical Therapy Evaluation Patient Details Name: [name] MRN: [id number] DOB: 04/17/1931 Today's Date: 11/12/2014   History of Present Illness  Pt with past medical history of chronic systolic congestive heart failure, DJD with patient being bedbound for last 6 months, type 2 diabetes, hypertension, A. fib on a liquid comes to the emergency room after she was found to have elevated creatinine on her routine lab work that was done at her nursing home.  Clinical Impression  Pt presents with decreased functional strength and mobility and would benefit from acute PT services to address objective findings.  Pt with multiple admissions during the past 6 month and a progressive decline in mobility related to medical condition and L hip pain.  Pt receiving PT at SNF prior to admission and pt motivated to go back and continue once this acute event is resolved.    Follow Up Recommendations SNF    Equipment Recommendations   (lift)    Recommendations for Other Services       Precautions / Restrictions Precautions Precautions: Fall Restrictions Weight Bearing Restrictions: No      Mobility  Bed Mobility Overal bed mobility: Needs Assistance Bed Mobility: Supine to Sit;Sit to Supine     Supine to sit: Max assist;HOB elevated Sit to supine: Max assist;HOB elevated   General bed mobility comments: Max A for scooting up in bed; pt able to assist with B UE's and minimally with B LE's pushing against bed.  Transfers                 General transfer comment: Not attempted this date secondary to pain, fear, and nausea.  Ambulation/Gait             General Gait Details: non-ambulatory at baseline  Stairs            Wheelchair Mobility    Modified Rankin (Stroke Patients Only)       Balance   Sitting-balance support: Bilateral upper extremity supported Sitting balance-Leahy Scale: Fair Sitting balance - Comments: maintains midline once positioned                                      Pertinent Vitals/Pain Pain Assessment: 0-10 Pain Score:  (not rated) Pain Location: L hip Pain Descriptors / Indicators: Aching Pain Intervention(s): Limited activity within patient's tolerance;Monitored during session;Repositioned;Relaxation    Home Living Family/patient expects to be discharged to:: Skilled nursing facility Living Arrangements: Other relatives (granddaughter) Available Help at Discharge: Family Type of Home: House         Home Equipment: Wheelchair - manual;Hospital bed Additional Comments: Pt most recently at [facility] where she was receiving PT services and working on standing activity tolerance.    Prior Function Level of Independence: Needs assistance   Gait / Transfers Assistance Needed: lift transfer from bed to w/c since at SNF; when living wiht grand daughter pt able to stand pivot to chair     Comments: Progressive wekaness and decreased functional ability since last admission and mostly bed bound at this time due to pain in L hip (bone on bone).     Hand Dominance        Extremity/Trunk Assessment   Upper Extremity Assessment: Generalized weakness           Lower Extremity Assessment: Generalized weakness (unable to SLR either leg; able to LAQ in seated)  Communication   Communication: No difficulties  Cognition Arousal/Alertness: Awake/alert Behavior During Therapy: WFL for tasks assessed/performed Overall Cognitive Status: Within Functional Limits for tasks assessed                      General Comments General comments (skin integrity, edema, etc.): LE and abdominal edema; especially suprapubic area    Exercises General Exercises - Lower Extremity Ankle Circles/Pumps: Both;10 reps Long Arc Quad: Both;10 reps Straight Leg Raises: Both;10 reps;AAROM Other Exercises Other Exercises: Core strengthening       Assessment/Plan    PT Assessment Patient  needs continued PT services  PT Diagnosis Generalized weakness;Acute pain   PT Problem List Decreased strength;Decreased range of motion;Decreased activity tolerance;Decreased balance;Decreased mobility;Decreased safety awareness  PT Treatment Interventions DME instruction;Functional mobility training;Therapeutic activities;Therapeutic exercise;Balance training;Patient/family education   PT Goals (Current goals can be found in the Care Plan section) Acute Rehab PT Goals Patient Stated Goal: None stated Time For Goal Achievement: 11/26/14 Potential to Achieve Goals: Fair    Frequency Min 2X/week   Barriers to discharge        Co-evaluation               End of Session Equipment Utilized During Treatment: Oxygen Activity Tolerance: Patient limited by fatigue;Patient limited by pain Patient left: in bed;with call bell/phone within reach Nurse Communication: Mobility status         Time: 1191-4782 PT Time Calculation (min) (ACUTE ONLY): 32 min   Charges:   PT Evaluation $Initial PT Evaluation Tier I: 1 Procedure PT Treatments $Therapeutic Exercise: 8-22 mins   PT G Codes:        Gisele A Long 24-Nov-2014, 2:53 PM

## 2014-11-12 NOTE — Progress Notes (Signed)
[facility] - [place] at [facility]   PATIENT NAME: [name]    MR#:  [id number]  DATE OF BIRTH:  09-14-1931  SUBJECTIVE:  CHIEF COMPLAINT:   Chief Complaint  Patient presents with  . Urinary Tract Infection   - Urine cultures growing ESBL E coli. Started on isolation and Invanz. -Continues to have some left hand tremors. -Renal function improved. Possible discharge tomorrow. Physical therapy consult pending   REVIEW OF SYSTEMS:  Review of Systems  Constitutional: Negative for fever and chills.  Respiratory: Negative for cough, shortness of breath and wheezing.   Cardiovascular: Positive for leg swelling. Negative for chest pain and palpitations.  Gastrointestinal: Negative for nausea, vomiting, abdominal pain, diarrhea and constipation.  Genitourinary: Negative for dysuria.  Musculoskeletal: Positive for myalgias and neck pain.  Neurological: Positive for tremors. Negative for dizziness, seizures and headaches.    DRUG ALLERGIES:   Allergies  Allergen Reactions  . Contrast Media [Iodinated Diagnostic Agents] Shortness Of Breath  . Tramadol Nausea Only  . Valium [Diazepam] Other (See Comments)    Reaction:  Elevated heart rate  . Iodine Strong [Iodine] Rash  . Penicillins Rash    VITALS:  Blood pressure 104/49, pulse 71, temperature 98.5 F (36.9 C), temperature source Oral, resp. rate 18, height  (1.549 m), weight 80.468 kg (177 lb 6.4 oz), SpO2 100 %.  PHYSICAL EXAMINATION:  Physical Exam  GENERAL:  81 y.o.-year-old patient lying in the bed with no acute distress.  EYES: Pupils equal, round, reactive to light and accommodation. No scleral icterus. Extraocular muscles intact.  HEENT: Head atraumatic, normocephalic. Oropharynx and nasopharynx clear.  NECK:  Supple, no jugular venous distention. No thyroid enlargement, no tenderness. Improved pain of right trapezius and tender spot noted, no erythema or swelling on exam LUNGS:  Normal breath sounds bilaterally, no wheezing, rales,rhonchi or crepitation. No use of accessory muscles of respiration. Decreased bibasilar breath sounds CARDIOVASCULAR: S1, S2 normal. No murmurs, rubs, or gallops.  ABDOMEN: Soft, nontender, nondistended. Bowel sounds present. No organomegaly or mass.  EXTREMITIES: 1-2+ pedal edema, cyanosis, or clubbing.  NEUROLOGIC: Cranial nerves II through XII are intact. Muscle strength 5/5 in all extremities. Upper extremities- proximal and distal muscles strength tested- normal, some myoclonic jerks noted prominent on left arm only when arms are above head level. Sensation intact. Gait not checked.  PSYCHIATRIC: The patient is alert and oriented x 3.  SKIN: No obvious rash, lesion, or ulcer.    LABORATORY PANEL:   CBC  Recent Labs Lab 11/09/14 1434  WBC 5.5  HGB 9.6*  HCT 31.8*  PLT 255   ------------------------------------------------------------------------------------------------------------------  Chemistries   Recent Labs Lab 11/09/14 1434  11/12/14 0422  NA 137  < > 139  K 5.4*  < > 5.1  CL 97*  < > 103  CO2 30  < > 31  GLUCOSE 181*  < > 62*  BUN 65*  < > 61*  CREATININE 2.68*  < > 1.87*  CALCIUM 8.5*  < > 8.1*  MG  --   --  2.7*  AST 19  --   --   ALT 9*  --   --   ALKPHOS 78  --   --   BILITOT 0.5  --   --   < > = values in this interval not displayed. ------------------------------------------------------------------------------------------------------------------  Cardiac Enzymes  Recent Labs Lab 11/09/14 1434  TROPONINI 0.05*   ------------------------------------------------------------------------------------------------------------------  RADIOLOGY:  US Renal  11/11/2014  CLINICAL DATA:  Acute renal failure  EXAM: RENAL / URINARY TRACT ULTRASOUND COMPLETE  COMPARISON:  CT 10/20/2014  FINDINGS: Right Kidney:  Length: 10.8 cm. Slightly increased echotexture. No mass or hydronephrosis visualized.  Left  Kidney:  Length: 8.9 cm. Increased echotexture. No mass or hydronephrosis visualized.  Bladder:  Appears normal for degree of bladder distention.  Ascites noted in the abdomen adjacent to the liver and spleen as seen on recent CT  IMPRESSION: Increased echotexture within the kidneys bilaterally suggesting chronic medical renal disease. No hydronephrosis.  Ascites.   Electronically Signed   By: Charlett Nose M.D.   On: 11/11/2014 09:48    EKG:   Orders placed or performed during the hospital encounter of 11/09/14  . ED EKG 12-Lead  . ED EKG 12-Lead  . EKG 12-Lead  . EKG 12-Lead    ASSESSMENT AND PLAN:   81 year old Miss Chizek with past medical history of chronic systolic congestive heart failure, DJD with patient being bedbound for last 6 months, type 2 diabetes, hypertension, A. fib on a liquid comes to the emergency room after she was found to have elevated creatinine on her routine lab work that was done at her nursing home.  1. Acute on chronic renal failure-Baseline creatinine 1.8, creatinine today is 1.8. -likely ATN. Improved now -Gentle hydration. -Renal calcifications likely vascular noted on previous CT scans in about 3 weeks ago. - renal ultrasound with medico-renal disease. Avoid nephrotoxins  2. ESBL UTI- from urine cultures - on invanz starting 11/11/14 and also started contact isolation - PICC line today and cont ABX for 10 days total  2. Chronic systolic CHF-last CT scan from 3 weeks ago showing ascites and abdominal wall edema. -Has chronic bilateral pleural effusions. Currently not in any acute failure -Since creatinine is normalized, discontinue IV fluids today. Diuretics on hold. -Continue to monitor closely  3. CAD status post CABG-stable. -Medications on hold due to hypotension  4. Paroxysmal atrial fibrillation-rate controlled. Coreg discontinued due to hypotension -Continue amiodarone. Also on eliquis for anticoagulation  5. Diabetes mellitus-decreased  Lantus dose as patient was hypoglycemic this morning - and also on sliding scale insulin  6. DVT prophylaxis-on eliquis  7. Myoclonic jerks- didn't see any meds that would cause that - physical therapy recommended for generalised weakness - Continue to monitor and if needed, medications for tremor can be added  Physical therapy consult today  All the records are reviewed and case discussed with Care Management/Social Workerr. Management plans discussed with the patient, family and they are in agreement.  CODE STATUS: Full Code  TOTAL TIME TAKING CARE OF THIS PATIENT: 40 minutes.   POSSIBLE D/C TOMORROW, DEPENDING ON CLINICAL CONDITION.   Enid Baas M.D on 11/12/2014 at 11:56 AM  Between 7am to 6pm - Pager - [phone]  After 6pm go to [email or web address] - password [email or web address] [facility]  [place] [facility]  Office  [phone]  CC: Primary care physician; Fidel Levy, MD

## 2014-11-12 NOTE — Plan of Care (Signed)
Problem: Discharge Progression Outcomes Goal: Other Discharge Outcomes/Goals Outcome: Progressing Patient c/o pain x1 relieved by prn Norco  Patient c/o indigestion x1 relieved prn Mallox/Mylanta  Patient c/o nausea x1 relieved well with prn Zofran PICC line placed today in right Brachial for long term IV  ABX Lasix to start tomorrow  VSS Nystatin ordered and applied  Patient is tolerating diet well PT saw patient Possible discharge to [facility] tomorrow

## 2014-11-13 LAB — BASIC METABOLIC PANEL
Anion gap: 4 — ABNORMAL LOW (ref 5–15)
BUN: 55 mg/dL — AB (ref 6–20)
CHLORIDE: 101 mmol/L (ref 101–111)
CO2: 33 mmol/L — AB (ref 22–32)
CREATININE: 1.74 mg/dL — AB (ref 0.44–1.00)
Calcium: 8.4 mg/dL — ABNORMAL LOW (ref 8.9–10.3)
GFR calc Af Amer: 30 mL/min — ABNORMAL LOW (ref >60)
GFR calc non Af Amer: 26 mL/min — ABNORMAL LOW (ref >60)
GLUCOSE: 105 mg/dL — AB (ref 65–99)
Potassium: 5.5 mmol/L — ABNORMAL HIGH (ref 3.5–5.1)
Sodium: 138 mmol/L (ref 135–145)

## 2014-11-13 LAB — GLUCOSE, CAPILLARY
GLUCOSE-CAPILLARY: 83 mg/dL (ref 65–99)
GLUCOSE-CAPILLARY: 81 mg/dL (ref 65–99)
GLUCOSE-CAPILLARY: 140 mg/dL — AB (ref 65–99)
GLUCOSE-CAPILLARY: 165 mg/dL — AB (ref 65–99)
Glucose-Capillary: 130 mg/dL — ABNORMAL HIGH (ref 65–99)

## 2014-11-13 MED ORDER — GABAPENTIN 400 MG PO CAPS
400.0000 mg | ORAL_CAPSULE | Freq: Two times a day (BID) | ORAL | Status: DC
  Administered 2014-11-13 – 2014-11-19 (×12): 400 mg via ORAL
  Filled 2014-11-13 (×12): qty 1

## 2014-11-13 NOTE — Progress Notes (Signed)
Physical Therapy Treatment Patient Details Name: [name] MRN: [id number] DOB: 03/03/1931 Today's Date: 11/13/2014    History of Present Illness Pt with past medical history of chronic systolic congestive heart failure, DJD with patient being bedbound for last 6 months, type 2 diabetes, hypertension, A. fib on a liquid comes to the emergency room after she was found to have elevated creatinine on her routine lab work that was done at her nursing home.    PT Comments    Pt agreeable to PT; requires encouragement/education as to benefits to agree to sit edge of bed. Requires some assist with several exercises due to weakness and education on managing L hip pain with exercises. Pt continues to requires Mod to Max assist with up and back to bed as well as rolling and repositioning in bed. Tolerated sitting edge of bed 15 minutes. Encouraged participating in exercises several times a day. Continue PT to progress strength to improve all functional mobility.   Follow Up Recommendations  SNF     Equipment Recommendations  Rolling walker with 5" wheels    Recommendations for Other Services       Precautions / Restrictions Precautions Precautions: Fall Restrictions Weight Bearing Restrictions: No    Mobility  Bed Mobility Overal bed mobility: Needs Assistance Bed Mobility: Rolling;Supine to Sit;Sit to Supine Rolling: Min assist (and use of rails)   Supine to sit: Mod assist (BLEs and trunk) Sit to supine: Max assist   General bed mobility comments: Max A to reposition upward in bed   Transfers                 General transfer comment: Not tested due to pain and weakness in L hip; pt reports bone on bone and inability to weightbear on LLE  Ambulation/Gait             General Gait Details: Non ambulatory   Stairs            Wheelchair Mobility    Modified Rankin (Stroke Patients Only)       Balance Overall balance assessment: Needs  assistance Sitting-balance support: Bilateral upper extremity supported Sitting balance-Leahy Scale: Fair Sitting balance - Comments: maintains sitting balance once positioned using BUEs; occasional min guard with LE exercises to maintain balance; forward flexed posture                            Cognition Arousal/Alertness: Awake/alert Behavior During Therapy: WFL for tasks assessed/performed Overall Cognitive Status: Within Functional Limits for tasks assessed                      Exercises General Exercises - Lower Extremity Ankle Circles/Pumps: Both;20 reps;AROM;Supine;Seated (performed 20 both positions) Quad Sets: Strengthening;Both;20 reps;Supine Gluteal Sets: Strengthening;Both;20 reps;Supine Short Arc Quad: Both;20 reps;Supine;AROM Long Arc Quad: AROM;Both;20 reps;Seated Heel Slides: AAROM;Both;20 reps;Supine Hip ABduction/ADduction: AAROM;Both;20 reps;Supine Straight Leg Raises: AAROM;Both;10 reps;Supine Hip Flexion/Marching: AROM;Both;20 reps;Seated (very limited range)    General Comments        Pertinent Vitals/Pain Pain Assessment: 0-10 Pain Score:  ("hurts so much" regarding L hip; generalized aches all over) Pain Location: L hip Pain Descriptors / Indicators: Constant;Aching Pain Intervention(s): Limited activity within patient's tolerance;Monitored during session    Home Living                      Prior Function  PT Goals (current goals can now be found in the care plan section) Progress towards PT goals: Progressing toward goals (slowly)    Frequency  Min 2X/week    PT Plan Current plan remains appropriate    Co-evaluation             End of Session Equipment Utilized During Treatment: Oxygen Activity Tolerance: Patient limited by pain;Patient limited by fatigue Patient left: in bed;with call bell/phone within reach;with bed alarm set     Time: 1023-1103 PT Time Calculation (min) (ACUTE ONLY): 40  min  Charges:  $Therapeutic Exercise: 8-22 mins $Therapeutic Activity: 8-22 mins                    G Codes:      Kristeen Miss 11/13/2014, 11:37 AM

## 2014-11-13 NOTE — Plan of Care (Addendum)
Problem: Discharge Progression Outcomes Goal: Other Discharge Outcomes/Goals Outcome: Progressing Patient may discharge to [facility] on Monday with IV invanz for UTI.  PICC placed in right upper arm Sunday, dried blood noted on dressing from placement.  Patient Alert and oriented, continues to require PRN pain medication for chronic hip/ back pain.  VSS, tolerating diet, no N/V my shift, PRN maalox given for indigestion once and it resolved.

## 2014-11-13 NOTE — Progress Notes (Signed)
[facility] Physicians - [place] at [facility]   PATIENT NAME: [name]    MR#:  [id number]  DATE OF BIRTH:  May 10, 1931  SUBJECTIVE:  CHIEF COMPLAINT:   Chief Complaint  Patient presents with  . Urinary Tract Infection   - Urine cultures growing ESBL E coli. Started on isolation and Invanz. -bleeding from PICC line site this am- dressing changed. No active bleeding -Renal function improved. Possible discharge tomorrow. Lasix restarted at lower dose today   REVIEW OF SYSTEMS:  Review of Systems  Constitutional: Negative for fever and chills.  Respiratory: Negative for cough, shortness of breath and wheezing.   Cardiovascular: Positive for leg swelling. Negative for chest pain and palpitations.  Gastrointestinal: Negative for nausea, vomiting, abdominal pain, diarrhea and constipation.  Genitourinary: Negative for dysuria.  Musculoskeletal: Positive for myalgias and neck pain.  Neurological: Positive for tremors. Negative for dizziness, seizures and headaches.    DRUG ALLERGIES:   Allergies  Allergen Reactions  . Contrast Media [Iodinated Diagnostic Agents] Shortness Of Breath  . Tramadol Nausea Only  . Valium [Diazepam] Other (See Comments)    Reaction:  Elevated heart rate  . Iodine Strong [Iodine] Rash  . Penicillins Rash    VITALS:  Blood pressure 115/55, pulse 64, temperature 97.5 F (36.4 C), temperature source Oral, resp. rate 16, height  (1.549 m), weight 82.056 kg (180 lb 14.4 oz), SpO2 99 %.  PHYSICAL EXAMINATION:  Physical Exam  GENERAL:  81 y.o.-year-old patient lying in the bed with no acute distress.  EYES: Pupils equal, round, reactive to light and accommodation. No scleral icterus. Extraocular muscles intact.  HEENT: Head atraumatic, normocephalic. Oropharynx and nasopharynx clear.  NECK:  Supple, no jugular venous distention. No thyroid enlargement, no tenderness. Improved pain of right trapezius and tender spot noted, no  erythema or swelling on exam LUNGS: Normal breath sounds bilaterally, no wheezing, rales,rhonchi or crepitation. No use of accessory muscles of respiration. Decreased bibasilar breath sounds CARDIOVASCULAR: S1, S2 normal. No murmurs, rubs, or gallops.  ABDOMEN: Soft, nontender, nondistended. Bowel sounds present. No organomegaly or mass.  EXTREMITIES: 2-3+ pedal edema, cyanosis, or clubbing. Edema upto abdominal wall noted. NEUROLOGIC: Cranial nerves II through XII are intact. Muscle strength 5/5 in all extremities. Upper extremities- proximal and distal muscles strength tested- normal, some myoclonic jerks noted prominent on left arm only when arms are above head level. Sensation intact. Gait not checked.  PSYCHIATRIC: The patient is alert and oriented x 3.  SKIN: No obvious rash, lesion, or ulcer.    LABORATORY PANEL:   CBC  Recent Labs Lab 11/09/14 1434  WBC 5.5  HGB 9.6*  HCT 31.8*  PLT 255   ------------------------------------------------------------------------------------------------------------------  Chemistries   Recent Labs Lab 11/09/14 1434  11/12/14 0422 11/13/14 0507  NA 137  < > 139 138  K 5.4*  < > 5.1 5.5*  CL 97*  < > 103 101  CO2 30  < > 31 33*  GLUCOSE 181*  < > 62* 105*  BUN 65*  < > 61* 55*  CREATININE 2.68*  < > 1.87* 1.74*  CALCIUM 8.5*  < > 8.1* 8.4*  MG  --   --  2.7*  --   AST 19  --   --   --   ALT 9*  --   --   --   ALKPHOS 78  --   --   --   BILITOT 0.5  --   --   --   < > =  values in this interval not displayed. ------------------------------------------------------------------------------------------------------------------  Cardiac Enzymes  Recent Labs Lab 11/09/14 1434  TROPONINI 0.05*   ------------------------------------------------------------------------------------------------------------------  RADIOLOGY:  Dg Chest 1 View  11/12/2014   CLINICAL DATA:  PICC line placement  EXAM: CHEST 1 VIEW  COMPARISON:  11/09/2014   FINDINGS: Right PICC line is in place with the tip at the cavoatrial junction. Prior CABG. Cardiomegaly. Concern for bilateral airspace opacities, new since prior study. This could represent edema. No visible effusions.  IMPRESSION: Right PICC line tip at the cavoatrial junction.  Cardiomegaly. New diffuse bilateral airspace disease concerning for edema/CHF.   Electronically Signed   By: Charlett Nose M.D.   On: 11/12/2014 14:28    EKG:   Orders placed or performed during the hospital encounter of 11/09/14  . ED EKG 12-Lead  . ED EKG 12-Lead  . EKG 12-Lead  . EKG 12-Lead    ASSESSMENT AND PLAN:   81 year old Miss [name] with past medical history of chronic systolic congestive heart failure, DJD with patient being bedbound for last 6 months, type 2 diabetes, hypertension, A. fib on a liquid comes to the emergency room after she was found to have elevated creatinine on her routine lab work that was done at her nursing home.  1. Acute on chronic renal failure-Baseline creatinine 1.8, creatinine back to baseline. -likely ATN. Improved now -Gentle hydration. -Renal calcifications likely vascular noted on previous CT scans in about 3 weeks ago. - renal ultrasound with medico-renal disease. Avoid nephrotoxins  2. ESBL UTI- from urine cultures - on invanz starting 11/11/14 and also started contact isolation - PICC line and cont ABX for 10 days total  2. Chronic systolic CHF-last CT scan from 3 weeks ago showing ascites and abdominal wall edema. -Has chronic bilateral pleural effusions. Currently not in any acute failure -Since creatinine is normalized, discontinued IV fluids. Low dose lasix started. -Continue to monitor closely  3. CAD status post CABG-stable. -Medications on hold due to hypotension  4. Paroxysmal atrial fibrillation-rate controlled. Coreg discontinued due to hypotension -Continue amiodarone. Also on eliquis for anticoagulation  5. Diabetes mellitus-decreased Lantus dose  as patient was hypoglycemic this morning - and also on sliding scale insulin  6. DVT prophylaxis-on eliquis  7. Myoclonic jerks- didn't see any meds that would cause that - physical therapy recommended for generalised weakness - Continue to monitor and if needed, medications for tremor can be added  Physical therapy consulted  All the records are reviewed and case discussed with Care Management/Social Workerr. Management plans discussed with the patient, family and they are in agreement.  CODE STATUS: Full Code  TOTAL TIME TAKING CARE OF THIS PATIENT: 40 minutes.   POSSIBLE D/C TOMORROW, DEPENDING ON CLINICAL CONDITION.   Enid Baas M.D on 11/13/2014 at 12:40 PM  Between 7am to 6pm - Pager - [phone]  After 6pm go to [email or web address] - password [email or web address] [facility]  [place]  [facility]ospital[facility]  Office  [phone]  CC: Primary care physician; Fidel Levy, MD

## 2014-11-13 NOTE — Plan of Care (Signed)
Problem: Discharge Progression Outcomes Goal: Other Discharge Outcomes/Goals Outcome: Progressing Plan of care progress to goal for: 1. Pain-pt c/o neck and shoulder pain, prn meds given with improvement 2. Hemodynamically-             -VSS, pt remains afebrile this shift             -IV fluids continue per orders             -IV ABT started for ESBL in urine 3. Complications-no evidence this shift 4. Diet-pt tolerating diet this shift 5. Activity-pt has been bed bound for the past 6 mos.        

## 2014-11-13 NOTE — Care Management Important Message (Signed)
Important Message  Patient Details  Name: [name] MRN: [id number] Date of Birth: [birth year]   Medicare Important Message Given:  Yes-fourth notification given    Gwenette Greet, RN 11/13/2014, 12:23 PM

## 2014-11-14 ENCOUNTER — Inpatient Hospital Stay (HOSPITAL_COMMUNITY)
Admit: 2014-11-14 | Discharge: 2014-11-14 | Disposition: A | Discharge status: Home / Self Care | Payer: Medicare Other | Attending: Internal Medicine | Admitting: Internal Medicine

## 2014-11-14 DIAGNOSIS — I340 Nonrheumatic mitral (valve) insufficiency: Secondary | ICD-10-CM

## 2014-11-14 LAB — CULTURE, BLOOD (ROUTINE X 2): CULTURE: NO GROWTH

## 2014-11-14 LAB — GLUCOSE, CAPILLARY
GLUCOSE-CAPILLARY: 156 mg/dL — AB (ref 65–99)
Glucose-Capillary: 108 mg/dL — ABNORMAL HIGH (ref 65–99)
Glucose-Capillary: 136 mg/dL — ABNORMAL HIGH (ref 65–99)
Glucose-Capillary: 210 mg/dL — ABNORMAL HIGH (ref 65–99)

## 2014-11-14 MED ORDER — FUROSEMIDE 10 MG/ML IJ SOLN: 40.0000 mg | Freq: Once | INTRAMUSCULAR | Status: DC

## 2014-11-14 MED ORDER — SODIUM CHLORIDE 0.9 % IJ SOLN: 3.0000 mL | INTRAMUSCULAR | Status: DC | PRN

## 2014-11-14 MED ORDER — FUROSEMIDE 10 MG/ML IJ SOLN
4.0000 mg/h | INTRAVENOUS | Status: DC
  Administered 2014-11-14 – 2014-11-21 (×5): 4 mg/h via INTRAVENOUS
  Filled 2014-11-14 (×7): qty 25

## 2014-11-14 MED ORDER — SODIUM CHLORIDE 0.9 % IJ SOLN
3.0000 mL | Freq: Two times a day (BID) | INTRAMUSCULAR | Status: DC
  Administered 2014-11-14 – 2014-12-03 (×35): 3 mL via INTRAVENOUS

## 2014-11-14 NOTE — Plan of Care (Signed)
Problem: Discharge Progression Outcomes Goal: Other Discharge Outcomes/Goals Outcome: Progressing Plan of care progress to goal 1. Pain: Prn pain medication given for c/o right shoulder and left hip pain with improvement. 2. Activity: Pt at baseline, has been bed bound for six months. 3. Hemodynamics: Pt VSS. Pt admitted with ARF. Cr 1.74. BUN 55. Which have improved since admission.  4. Diet: Pt tolerating 2 gm sodium diet.  Pt for possible discharge today to [facility].

## 2014-11-14 NOTE — Progress Notes (Signed)
*  PRELIMINARY RESULTS* Echocardiogram 2D Echocardiogram has been performed.  Georgann Housekeeper Hege 11/14/2014, 2:43 PM

## 2014-11-14 NOTE — Consult Note (Signed)
Date: 11/14/2014                  Patient Name:  [name]  MRN: [id number]  DOB: [birth year]  Age / Sex: 81 y.o., female         PCP: Fidel Levy, MD                 Service Requesting Consult: Internal medicine                 Reason for Consult:  chronic kidney disease and fluid overload             History of Present Illness: Patient is a 81 y.o. female with medical problems of chronic systolic congestive heart failure, type 2 diabetes, hypertension, atrial fibrillation, DJD, COPD, coronary disease/CABG 1988, catheter 2015, recurrent UTIs, chronic kidney disease, who was admitted to [facility] on 11/09/2014 for evaluation of elevated creatinine above her baseline and increased swelling.  Admission creatinine was 2.68. Today's creatinine has improved down to 1.74 Patient is incontinent. Urine output is not accurately measured Renal ultrasound report is reviewed  Medications: Outpatient medications: Prescriptions prior to admission  Medication Sig Dispense Refill Last Dose  . acidophilus (RISAQUAD) CAPS capsule Take 1 capsule by mouth 2 (two) times daily. 60 capsule 4 11/09/2014 at 0800  . albuterol (PROVENTIL) (2.5 MG/3ML) 0.083% nebulizer solution Take 3 mLs (2.5 mg total) by nebulization every 6 (six) hours as needed for wheezing or shortness of breath. 150 mL 1 prn at prn  . amiodarone (PACERONE) 200 MG tablet Take 1 tablet (200 mg total) by mouth daily. 90 tablet 3 11/09/2014 at 0800  . apixaban (ELIQUIS) 2.5 MG TABS tablet Take 1 tablet (2.5 mg total) by mouth 2 (two) times daily. 60 tablet 6 11/09/2014 at 0800  . benazepril (LOTENSIN) 20 MG tablet Take 20 mg by mouth daily.   11/09/2014 at 0800  . carvedilol (COREG) 3.125 MG tablet Take 1 tablet (3.125 mg total) by mouth 2 (two) times daily. 180 tablet 3 11/09/2014 at 0800  . Cranberry 1000 MG CAPS Take 1 capsule (1,000 mg total) by mouth daily. 30 each 12 11/09/2014 at 0800  . docusate sodium (COLACE) 100 MG capsule Take 1  capsule (100 mg total) by mouth 2 (two) times daily. 60 capsule 0 11/09/2014 at 0800  . gabapentin (NEURONTIN) 400 MG capsule Take 400 mg by mouth 3 (three) times daily.   11/09/2014 at 1400  . HYDROcodone-acetaminophen (NORCO/VICODIN) 5-325 MG per tablet Take 1-2 tablets by mouth 2 (two) times daily as needed for moderate pain.   11/09/2014 at 0730  . insulin lispro (HUMALOG) 100 UNIT/ML injection Inject 0.04 mLs (4 Units total) into the skin 3 (three) times daily before meals. (Patient taking differently: Inject 10 Units into the skin 3 (three) times daily before meals. ) 10 mL 11 11/09/2014 at Unknown time  . isosorbide mononitrate (IMDUR) 60 MG 24 hr tablet Take 90 mg by mouth daily.    11/09/2014 at 0800  . LANTUS SOLOSTAR 100 UNIT/ML Solostar Pen Inject 32 Units into the skin at bedtime.    11/08/2014 at 2000  . levothyroxine (SYNTHROID, LEVOTHROID) 100 MCG tablet Take 100 mcg by mouth daily.    11/09/2014 at 0600  . lubiprostone (AMITIZA) 24 MCG capsule Take 24 mcg by mouth 2 (two) times daily with a meal.   11/09/2014 at 0800  . nitroGLYCERIN (NITROSTAT) 0.4 MG SL tablet Place 1 tablet (0.4 mg total) under the  tongue every 5 (five) minutes as needed for chest pain. 30 tablet 0 prn at prn  . polyethylene glycol (MIRALAX / GLYCOLAX) packet Take 17 g by mouth daily as needed for mild constipation or moderate constipation. 14 each 0 prn at prn  . potassium chloride (K-DUR) 10 MEQ tablet Take 10 mEq by mouth daily.    11/09/2014 at 0800  . pravastatin (PRAVACHOL) 40 MG tablet Take 1 tablet (40 mg total) by mouth daily. 90 tablet 3 11/08/2014 at 1500  . torsemide (DEMADEX) 20 MG tablet Take 40 mg in the morning and 20 mg in the afternoon. (Patient taking differently: Take 20 mg by mouth 2 (two) times daily. ) 180 tablet 3 11/08/2014 at Unknown time    Current medications: Current Facility-Administered Medications  Medication Dose Route Frequency Provider Last Rate Last Dose  . acetaminophen (TYLENOL) tablet  650 mg  650 mg Oral Q6H PRN Enedina Finner, MD   650 mg at 11/10/14 0650   Or  . acetaminophen (TYLENOL) suppository 650 mg  650 mg Rectal Q6H PRN Enedina Finner, MD      . acidophilus (RISAQUAD) capsule 1 capsule  1 capsule Oral BID Enedina Finner, MD   1 capsule at 11/14/14 0848  . albuterol (PROVENTIL) (2.5 MG/3ML) 0.083% nebulizer solution 2.5 mg  2.5 mg Nebulization Q6H PRN Enedina Finner, MD      . alum & mag hydroxide-simeth (MAALOX/MYLANTA) 200-200-20 MG/5ML suspension 30 mL  30 mL Oral Q6H PRN Enid Baas, MD   30 mL at 11/13/14 1630  . amiodarone (PACERONE) tablet 200 mg  200 mg Oral Daily Enedina Finner, MD   200 mg at 11/14/14 0848  . apixaban (ELIQUIS) tablet 2.5 mg  2.5 mg Oral BID Enedina Finner, MD   2.5 mg at 11/14/14 0849  . docusate sodium (COLACE) capsule 100 mg  100 mg Oral BID Enedina Finner, MD   100 mg at 11/14/14 0849  . ertapenem (INVANZ) 0.5 g in sodium chloride 0.9 % 50 mL IVPB  500 mg Intravenous Q24H Enid Baas, MD   0.5 g at 11/13/14 1115  . furosemide (LASIX) injection 40 mg  40 mg Intravenous Once Enid Baas, MD      . furosemide (LASIX) tablet 20 mg  20 mg Oral Daily Enid Baas, MD   20 mg at 11/14/14 0849  . gabapentin (NEURONTIN) capsule 400 mg  400 mg Oral BID Enid Baas, MD   400 mg at 11/14/14 0848  . HYDROcodone-acetaminophen (NORCO/VICODIN) 5-325 MG per tablet 1-2 tablet  1-2 tablet Oral Q6H PRN Enid Baas, MD   2 tablet at 11/13/14 2228  . insulin aspart (novoLOG) injection 0-5 Units  0-5 Units Subcutaneous QHS Wyatt Haste, MD   2 Units at 11/10/14 2106  . insulin aspart (novoLOG) injection 0-9 Units  0-9 Units Subcutaneous TID WC Wyatt Haste, MD   2 Units at 11/13/14 1812  . insulin glargine (LANTUS) injection 22 Units  22 Units Subcutaneous QHS Enid Baas, MD   22 Units at 11/13/14 2229  . levothyroxine (SYNTHROID, LEVOTHROID) tablet 100 mcg  100 mcg Oral QAC breakfast Enedina Finner, MD   100 mcg at 11/14/14 0849  . lubiprostone  (AMITIZA) capsule 24 mcg  24 mcg Oral BID WC Enedina Finner, MD   24 mcg at 11/14/14 0848  . nitroGLYCERIN (NITROSTAT) SL tablet 0.4 mg  0.4 mg Sublingual Q5 min PRN Enedina Finner, MD      . nystatin cream (MYCOSTATIN)   Topical BID  Enid Baas, MD      . ondansetron [facility]) tablet 4 mg  4 mg Oral Q6H PRN Enedina Finner, MD       Or  . ondansetron (ZOFRAN) injection 4 mg  4 mg Intravenous Q6H PRN Enedina Finner, MD   4 mg at 11/13/14 1812  . polyethylene glycol (MIRALAX / GLYCOLAX) packet 17 g  17 g Oral Daily PRN Enedina Finner, MD      . pravastatin (PRAVACHOL) tablet 40 mg  40 mg Oral Daily Enedina Finner, MD   40 mg at 11/14/14 0848      Allergies: Allergies  Allergen Reactions  . Contrast Media [Iodinated Diagnostic Agents] Shortness Of Breath  . Tramadol Nausea Only  . Valium [Diazepam] Other (See Comments)    Reaction:  Elevated heart rate  . Iodine Strong [Iodine] Rash  . Penicillins Rash      Past Medical History: Past Medical History  Diagnosis Date  . DM2 (diabetes mellitus, type 2)     onset age 61 insulin dependent  . Hyperlipidemia   . COPD (chronic obstructive pulmonary disease)   . Peripheral vascular disease   . CAD (coronary artery disease)     a. MI 1988 s/p CABG in 1988, multiple PCI on SVGs; b. cath 2012: occluded native coronary arteries, patent LIMA to LAD (distal LAD dz), patent SVG-OM & occluded SVG-RCA which filled via left to right collats; c. cath 12/2013 patent LIMA-LAD & SVG-O. known occluded VG-RCA w/ L-R collats, no change since cath 2012  . Chronic systolic CHF (congestive heart failure)     a. 03/2012: EF 40-45%, mild lateral wall HK, mod inf HK, mod post wall HK, mild LVH, mild MR/TR   . Paroxysmal atrial fibrillation     a. CHADSVASc 7: yearly risk of CVA 9.6%;. b. on Eliquis 2.5 mg bid (age, SCr, borderline wt 62.9 Kg)   . HTN (hypertension)   . Yeast infection   . UTI (urinary tract infection)     Recurrent ESBL E.coli UTI  . Atherosclerosis of native  artery of left lower extremity   . Myocardial infarction   . CKD (chronic kidney disease) stage 3, GFR 30-59 ml/min   . Hypoxia     on nocturnal o2     Past Surgical History: Past Surgical History  Procedure Laterality Date  . Abdominal hysterectomy    . Coronary artery bypass graft    . Appendectomy    . Cholecystectomy    . Ovary surgery    . Angioplasty / stenting femoral      Bilateral SFA stenting  . Femur surgery    . Cardiac catheterization      mc  . Cardiac catheterization  12/2013  . Right hip replacement    . Total knee arthroplasty      right knee     Family History: Family History  Problem Relation Age of Onset  . Diabetes Mother   . Heart disease Mother   . Hypertension Mother   . Heart disease Father   . Heart disease Brother   . CVA Father      Social History: Social History   Social History  . Marital Status: Widowed    Spouse Name: N/A  . Number of Children: N/A  . Years of Education: N/A   Occupational History  . Not on file.   Social History Main Topics  . Smoking status: Former Smoker -- 15 years    Types: Cigarettes    Quit  date: 02/18/1988  . Smokeless tobacco: Never Used  . Alcohol Use: No  . Drug Use: No  . Sexual Activity: No   Other Topics Concern  . Not on file   Social History Narrative   Lives at home with granddaughter. Has a wheelchair.     Review of Systems: Gen: Weight gain recently, no fevers or chills HEENT: No new problems reported CV: Patient has history of CABG and congestive heart failure, Resp: No shortness of breath at present GI: Able to eat without any problem. No nausea, vomiting, diarrhea GU : Incontinent MS: Arthritis, wheelchair bound at the nursing home Derm:  No acute complaints Psych: No complaints Heme: No complaints Neuro: Wheelchair-bound Endocrine no complaints  Vital Signs: Blood pressure 120/60, pulse 65, temperature 97.7 F (36.5 C), temperature source Oral, resp. rate 18,  height  (1.549 m), weight 83.235 kg (183 lb 8 oz), SpO2 99 %.   Intake/Output Summary (Last 24 hours) at 11/14/14 1039 Last data filed at 11/13/14 1115  Gross per 24 hour  Intake    150 ml  Output      0 ml  Net    150 ml    Weight trends: Filed Weights   11/12/14 0500 11/13/14 0555 11/14/14 0512  Weight: 80.468 kg (177 lb 6.4 oz) 82.056 kg (180 lb 14.4 oz) 83.235 kg (183 lb 8 oz)    Physical Exam: General:  elderly, frail lady, laying in the bed   HEENT  moist mucous membranes, anicteric sclera   Neck:  supple   Lungs:  mild diffuse basilar crackles   Heart::  irregular rhythm   Abdomen:  soft, nontender   Extremities:  3+ dependent edema up to abdomen   Neurologic:  alert, able to follow commands   Skin:  no acute rashes   Access:   Foley:        Lab results: Basic Metabolic Panel:  Recent Labs Lab 11/11/14 0517 11/12/14 0422 11/13/14 0507  NA 140 139 138  K 5.1 5.1 5.5*  CL 101 103 101  CO2 33* 31 33*  GLUCOSE 86 62* 105*  BUN 64* 61* 55*  CREATININE 2.30* 1.87* 1.74*  CALCIUM 8.3* 8.1* 8.4*  MG  --  2.7*  --     Liver Function Tests:  Recent Labs Lab 11/09/14 1434  AST 19  ALT 9*  ALKPHOS 78  BILITOT 0.5  PROT 6.3*  ALBUMIN 3.3*   No results for input(s): LIPASE, AMYLASE in the last 168 hours. No results for input(s): AMMONIA in the last 168 hours.  CBC:  Recent Labs Lab 11/09/14 1434  WBC 5.5  NEUTROABS 3.9  HGB 9.6*  HCT 31.8*  MCV 78.7*  PLT 255    Cardiac Enzymes:  Recent Labs Lab 11/09/14 1434  TROPONINI 0.05*    BNP: Invalid input(s): POCBNP  CBG:  Recent Labs Lab 11/13/14 0739 11/13/14 1059 11/13/14 1606 11/13/14 2136 11/14/14 0710  GLUCAP 81 140* 165* 130* 108*    Microbiology: Recent Results (from the past 720 hour(s))  Blood culture (routine x 2)     Status: None   Collection Time: 10/17/14  9:07 AM  Result Value Ref Range Status   Specimen Description BLOOD LEFT ASSIST CONTROL  Final    Special Requests BOTTLES DRAWN AEROBIC AND ANAEROBIC  1 CC  Final   Culture NO GROWTH 5 DAYS  Final   Report Status 10/22/2014 FINAL  Final  Blood culture (routine x 2)  Status: None   Collection Time: 10/17/14  9:07 AM  Result Value Ref Range Status   Specimen Description BLOOD RIGHT ASSIST CONTROL  Final   Special Requests BOTTLES DRAWN AEROBIC AND ANAEROBIC  1 CC  Final   Culture NO GROWTH 5 DAYS  Final   Report Status 10/22/2014 FINAL  Final  Urine culture     Status: None   Collection Time: 10/17/14  5:51 PM  Result Value Ref Range Status   Specimen Description URINE, CLEAN CATCH  Final   Special Requests NONE  Final   Culture 20,000 COLONIES/mL CANDIDA ALBICANS  Final   Report Status 10/20/2014 FINAL  Final  MRSA PCR Screening     Status: None   Collection Time: 10/17/14  5:51 PM  Result Value Ref Range Status   MRSA by PCR NEGATIVE NEGATIVE Final    Comment:        The GeneXpert MRSA Assay (FDA approved for NASAL specimens only), is one component of a comprehensive MRSA colonization surveillance program. It is not intended to diagnose MRSA infection nor to guide or monitor treatment for MRSA infections.   Blood Culture (routine x 2)     Status: None (Preliminary result)   Collection Time: 11/09/14  2:34 PM  Result Value Ref Range Status   Specimen Description BLOOD RIGHT ARM  Final   Special Requests BOTTLES DRAWN AEROBIC AND ANAEROBIC 3CC  Final   Culture NO GROWTH 4 DAYS  Final   Report Status PENDING  Incomplete  Urine culture     Status: None   Collection Time: 11/09/14  2:34 PM  Result Value Ref Range Status   Specimen Description URINE, CLEAN CATCH  Final   Special Requests NONE  Final   Culture   Final    >=100,000 COLONIES/mL ESCHERICHIA COLI ESBL-EXTENDED SPECTRUM BETA LACTAMASE-THE ORGANISM IS RESISTANT TO PENICILLINS, CEPHALOSPORINS AND AZTREONAM ACCORDING TO CLSI M100-S15 VOL.25 N01 JAN 2005. CRITICAL RESULT CALLED TO, READ BACK BY AND VERIFIED  WITH: DANIEL JACOBS,RN 11/11/2014 0909 JRS.    Report Status 11/11/2014 FINAL  Final   Organism ID, Bacteria ESCHERICHIA COLI  Final      Susceptibility   Escherichia coli - MIC*    AMPICILLIN >=32 RESISTANT Resistant     CEFAZOLIN >=64 RESISTANT Resistant     CEFTRIAXONE >=64 RESISTANT Resistant     CIPROFLOXACIN >=4 RESISTANT Resistant     GENTAMICIN <=1 SENSITIVE Sensitive     IMIPENEM <=0.25 SENSITIVE Sensitive     NITROFURANTOIN <=16 SENSITIVE Sensitive     TRIMETH/SULFA >=320 RESISTANT Resistant     Extended ESBL POSITIVE Resistant     PIP/TAZO Value in next row Sensitive      SENSITIVE8    LEVOFLOXACIN Value in next row Resistant      RESISTANT>=8    * >=100,000 COLONIES/mL ESCHERICHIA COLI  MRSA PCR Screening     Status: None   Collection Time: 11/09/14  6:24 PM  Result Value Ref Range Status   MRSA by PCR NEGATIVE NEGATIVE Final    Comment:        The GeneXpert MRSA Assay (FDA approved for NASAL specimens only), is one component of a comprehensive MRSA colonization surveillance program. It is not intended to diagnose MRSA infection nor to guide or monitor treatment for MRSA infections.      Coagulation Studies: No results for input(s): LABPROT, INR in the last 72 hours.  Urinalysis: No results for input(s): COLORURINE, LABSPEC, PHURINE, GLUCOSEU, HGBUR, BILIRUBINUR, KETONESUR, PROTEINUR, UROBILINOGEN,  NITRITE, LEUKOCYTESUR in the last 72 hours.  Invalid input(s): APPERANCEUR    Imaging: Dg Chest 1 View  11/12/2014   CLINICAL DATA:  PICC line placement  EXAM: CHEST 1 VIEW  COMPARISON:  11/09/2014  FINDINGS: Right PICC line is in place with the tip at the cavoatrial junction. Prior CABG. Cardiomegaly. Concern for bilateral airspace opacities, new since prior study. This could represent edema. No visible effusions.  IMPRESSION: Right PICC line tip at the cavoatrial junction.  Cardiomegaly. New diffuse bilateral airspace disease concerning for edema/CHF.    Electronically Signed   By: Charlett Nose M.D.   On: 11/12/2014 14:28      Assessment & Plan: Pt is a 81 y.o. yo female with a PMHX of chronic systolic congestive heart failure, type 2 diabetes, hypertension, atrial fibrillation, DJD, COPD, coronary disease/CABG 1988, catheter 2015, recurrent UTIs, chronic kidney disease, who was admitted to [facility] on 11/09/2014 for evaluation of elevated creatinine above her baseline and increased swelling.   1. Acute renal failure on chronic kidney disease stage . Patient's baseline creatinine is 1.8/GFR of 25. CKD is likely secondary to atherosclerosis. Acute worsening in creatinine is likely secondary to recent UTI from ESBL possibly leading to volume issues and ATN. Serum creatinine has improved close to baseline at 1.7/GFR 26. 2. Anasarca. Likely from systolic congestive heart failure. No recent echo available. Recommend getting outpatient results or getting another echo while she is in the hospital. We will place the patient on IV Lasix infusion. She will need a Foley catheter for the duration of diuresis.

## 2014-11-14 NOTE — Progress Notes (Signed)
Physical Therapy Treatment Patient Details Name: [name] MRN: [id number] DOB: 09-09-1931 Today's Date: 11/14/2014    History of Present Illness Pt with past medical history of chronic systolic congestive heart failure, DJD with patient being bedbound for last 6 months, type 2 diabetes, hypertension, A. fib on a liquid comes to the emergency room after she was found to have elevated creatinine on her routine lab work that was done at her nursing home.    PT Comments    Pt has increased L hip pain today, but does not worsen with exercises. Pt requires assist with movements at the hip; limited range that improves a little with continued work. Pt notes increased ease with exercises as she continues. Pt does not wish up in bed today due to L hip pain, general malaise and she notes she has not eaten her breakfast this morning. Pt given her breakfast post session. Continue Pt for progression of strength, endurance and functional mobility at prior level of function.   Follow Up Recommendations  SNF     Equipment Recommendations  Rolling walker with 5" wheels    Recommendations for Other Services       Precautions / Restrictions Precautions Precautions: Fall Restrictions Weight Bearing Restrictions: No    Mobility  Bed Mobility               General bed mobility comments: Not testedm due to pain with l hip movement and pt desired to remain in bed; pt has also not eaten breakfast yet  Transfers                    Ambulation/Gait             General Gait Details: Non ambulatory   Stairs            Wheelchair Mobility    Modified Rankin (Stroke Patients Only)       Balance                                    Cognition Arousal/Alertness: Awake/alert Behavior During Therapy: WFL for tasks assessed/performed Overall Cognitive Status: Within Functional Limits for tasks assessed                      Exercises General  Exercises - Lower Extremity Ankle Circles/Pumps: Both;20 reps;AROM;Supine;Seated Quad Sets: Strengthening;Both;20 reps;Supine Gluteal Sets: Strengthening;Both;20 reps;Supine Short Arc Quad: Both;20 reps;Supine;AROM Heel Slides: AAROM;Both;20 reps;Supine Hip ABduction/ADduction: AAROM;Both;20 reps;Supine Straight Leg Raises: AAROM;Both;10 reps;Supine    General Comments        Pertinent Vitals/Pain Pain Assessment: 0-10 Pain Score: 10-Worst pain ever Pain Location: L hip Pain Descriptors / Indicators: Constant;Aching;Sharp Pain Intervention(s): Limited activity within patient's tolerance;Premedicated before session    Home Living                      Prior Function            PT Goals (current goals can now be found in the care plan section) Progress towards PT goals: Progressing toward goals (slowly)    Frequency  Min 2X/week    PT Plan Current plan remains appropriate    Co-evaluation             End of Session Equipment Utilized During Treatment: Oxygen Activity Tolerance: Patient tolerated treatment well;No increased pain Patient left: in bed;with call bell/phone within reach;with  bed alarm set     Time: 4540-9811 PT Time Calculation (min) (ACUTE ONLY): 23 min  Charges:  $Therapeutic Exercise: 23-37 mins                    G Codes:      Kristeen Miss 11/14/2014, 10:05 AM

## 2014-11-14 NOTE — Progress Notes (Signed)
ANTIBIOTIC CONSULT NOTE -follow up  Pharmacy Consult for Ertapenem Indication: UTI (E Coli) ESBL  Allergies  Allergen Reactions  . Contrast Media [Iodinated Diagnostic Agents] Shortness Of Breath  . Tramadol Nausea Only  . Valium [Diazepam] Other (See Comments)    Reaction:  Elevated heart rate  . Iodine Strong [Iodine] Rash  . Penicillins Rash    Patient Measurements: Height:  (154.9 cm) Weight: 183 lb 8 oz (83.235 kg) IBW/kg (Calculated) : 47.8 Adjusted Body Weight:   Vital Signs: Temp: 97.4 F (36.3 C) (09/27 1350) Temp Source: Oral (09/27 1350) BP: 128/51 mmHg (09/27 1350) Pulse Rate: 71 (09/27 1350) Intake/Output from previous day: 09/26 0701 - 09/27 0700 In: 150 [IV Piggyback:150] Out: -  Intake/Output from this shift:    Labs:  Recent Labs  11/12/14 0422 11/13/14 0507  CREATININE 1.87* 1.74*   Estimated Creatinine Clearance: 24.8 mL/min (by C-G formula based on Cr of 1.74).   Microbiology: Recent Results (from the past 720 hour(s))  Blood culture (routine x 2)     Status: None   Collection Time: 10/17/14  9:07 AM  Result Value Ref Range Status   Specimen Description BLOOD LEFT ASSIST CONTROL  Final   Special Requests BOTTLES DRAWN AEROBIC AND ANAEROBIC  1 CC  Final   Culture NO GROWTH 5 DAYS  Final   Report Status 10/22/2014 FINAL  Final  Blood culture (routine x 2)     Status: None   Collection Time: 10/17/14  9:07 AM  Result Value Ref Range Status   Specimen Description BLOOD RIGHT ASSIST CONTROL  Final   Special Requests BOTTLES DRAWN AEROBIC AND ANAEROBIC  1 CC  Final   Culture NO GROWTH 5 DAYS  Final   Report Status 10/22/2014 FINAL  Final  Urine culture     Status: None   Collection Time: 10/17/14  5:51 PM  Result Value Ref Range Status   Specimen Description URINE, CLEAN CATCH  Final   Special Requests NONE  Final   Culture 20,000 COLONIES/mL CANDIDA ALBICANS  Final   Report Status 10/20/2014 FINAL  Final  MRSA PCR Screening      Status: None   Collection Time: 10/17/14  5:51 PM  Result Value Ref Range Status   MRSA by PCR NEGATIVE NEGATIVE Final    Comment:        The GeneXpert MRSA Assay (FDA approved for NASAL specimens only), is one component of a comprehensive MRSA colonization surveillance program. It is not intended to diagnose MRSA infection nor to guide or monitor treatment for MRSA infections.   Blood Culture (routine x 2)     Status: None   Collection Time: 11/09/14  2:34 PM  Result Value Ref Range Status   Specimen Description BLOOD RIGHT ARM  Final   Special Requests BOTTLES DRAWN AEROBIC AND ANAEROBIC 3CC  Final   Culture NO GROWTH 5 DAYS  Final   Report Status 11/14/2014 FINAL  Final  Urine culture     Status: None   Collection Time: 11/09/14  2:34 PM  Result Value Ref Range Status   Specimen Description URINE, CLEAN CATCH  Final   Special Requests NONE  Final   Culture   Final    >=100,000 COLONIES/mL ESCHERICHIA COLI ESBL-EXTENDED SPECTRUM BETA LACTAMASE-THE ORGANISM IS RESISTANT TO PENICILLINS, CEPHALOSPORINS AND AZTREONAM ACCORDING TO CLSI M100-S15 VOL.25 N01 JAN 2005. CRITICAL RESULT CALLED TO, READ BACK BY AND VERIFIED WITH: DANIEL JACOBS,RN 11/11/2014 0909 JRS.    Report Status 11/11/2014  FINAL  Final   Organism ID, Bacteria ESCHERICHIA COLI  Final      Susceptibility   Escherichia coli - MIC*    AMPICILLIN >=32 RESISTANT Resistant     CEFAZOLIN >=64 RESISTANT Resistant     CEFTRIAXONE >=64 RESISTANT Resistant     CIPROFLOXACIN >=4 RESISTANT Resistant     GENTAMICIN <=1 SENSITIVE Sensitive     IMIPENEM <=0.25 SENSITIVE Sensitive     NITROFURANTOIN <=16 SENSITIVE Sensitive     TRIMETH/SULFA >=320 RESISTANT Resistant     Extended ESBL POSITIVE Resistant     PIP/TAZO Value in next row Sensitive      SENSITIVE8    LEVOFLOXACIN Value in next row Resistant      RESISTANT>=8    * >=100,000 COLONIES/mL ESCHERICHIA COLI  MRSA PCR Screening     Status: None   Collection Time:  11/09/14  6:24 PM  Result Value Ref Range Status   MRSA by PCR NEGATIVE NEGATIVE Final    Comment:        The GeneXpert MRSA Assay (FDA approved for NASAL specimens only), is one component of a comprehensive MRSA colonization surveillance program. It is not intended to diagnose MRSA infection nor to guide or monitor treatment for MRSA infections.     Medical History: Past Medical History  Diagnosis Date  . DM2 (diabetes mellitus, type 2)     onset age 10 insulin dependent  . Hyperlipidemia   . COPD (chronic obstructive pulmonary disease)   . Peripheral vascular disease   . CAD (coronary artery disease)     a. MI 1988 s/p CABG in 1988, multiple PCI on SVGs; b. cath 2012: occluded native coronary arteries, patent LIMA to LAD (distal LAD dz), patent SVG-OM & occluded SVG-RCA which filled via left to right collats; c. cath 12/2013 patent LIMA-LAD & SVG-O. known occluded VG-RCA w/ L-R collats, no change since cath 2012  . Chronic systolic CHF (congestive heart failure)     a. 03/2012: EF 40-45%, mild lateral wall HK, mod inf HK, mod post wall HK, mild LVH, mild MR/TR   . Paroxysmal atrial fibrillation     a. CHADSVASc 7: yearly risk of CVA 9.6%;. b. on Eliquis 2.5 mg bid (age, SCr, borderline wt 62.9 Kg)   . HTN (hypertension)   . Yeast infection   . UTI (urinary tract infection)     Recurrent ESBL E.coli UTI  . Atherosclerosis of native artery of left lower extremity   . Myocardial infarction   . CKD (chronic kidney disease) stage 3, GFR 30-59 ml/min   . Hypoxia     on nocturnal o2    Medications:  Prescriptions prior to admission  Medication Sig Dispense Refill Last Dose  . acidophilus (RISAQUAD) CAPS capsule Take 1 capsule by mouth 2 (two) times daily. 60 capsule 4 11/09/2014 at 0800  . albuterol (PROVENTIL) (2.5 MG/3ML) 0.083% nebulizer solution Take 3 mLs (2.5 mg total) by nebulization every 6 (six) hours as needed for wheezing or shortness of breath. 150 mL 1 prn at prn   . amiodarone (PACERONE) 200 MG tablet Take 1 tablet (200 mg total) by mouth daily. 90 tablet 3 11/09/2014 at 0800  . apixaban (ELIQUIS) 2.5 MG TABS tablet Take 1 tablet (2.5 mg total) by mouth 2 (two) times daily. 60 tablet 6 11/09/2014 at 0800  . benazepril (LOTENSIN) 20 MG tablet Take 20 mg by mouth daily.   11/09/2014 at 0800  . carvedilol (COREG) 3.125 MG tablet Take 1 tablet (3.125  mg total) by mouth 2 (two) times daily. 180 tablet 3 11/09/2014 at 0800  . Cranberry 1000 MG CAPS Take 1 capsule (1,000 mg total) by mouth daily. 30 each 12 11/09/2014 at 0800  . docusate sodium (COLACE) 100 MG capsule Take 1 capsule (100 mg total) by mouth 2 (two) times daily. 60 capsule 0 11/09/2014 at 0800  . gabapentin (NEURONTIN) 400 MG capsule Take 400 mg by mouth 3 (three) times daily.   11/09/2014 at 1400  . HYDROcodone-acetaminophen (NORCO/VICODIN) 5-325 MG per tablet Take 1-2 tablets by mouth 2 (two) times daily as needed for moderate pain.   11/09/2014 at 0730  . insulin lispro (HUMALOG) 100 UNIT/ML injection Inject 0.04 mLs (4 Units total) into the skin 3 (three) times daily before meals. (Patient taking differently: Inject 10 Units into the skin 3 (three) times daily before meals. ) 10 mL 11 11/09/2014 at Unknown time  . isosorbide mononitrate (IMDUR) 60 MG 24 hr tablet Take 90 mg by mouth daily.    11/09/2014 at 0800  . LANTUS SOLOSTAR 100 UNIT/ML Solostar Pen Inject 32 Units into the skin at bedtime.    11/08/2014 at 2000  . levothyroxine (SYNTHROID, LEVOTHROID) 100 MCG tablet Take 100 mcg by mouth daily.    11/09/2014 at 0600  . lubiprostone (AMITIZA) 24 MCG capsule Take 24 mcg by mouth 2 (two) times daily with a meal.   11/09/2014 at 0800  . nitroGLYCERIN (NITROSTAT) 0.4 MG SL tablet Place 1 tablet (0.4 mg total) under the tongue every 5 (five) minutes as needed for chest pain. 30 tablet 0 prn at prn  . polyethylene glycol (MIRALAX / GLYCOLAX) packet Take 17 g by mouth daily as needed for mild constipation or  moderate constipation. 14 each 0 prn at prn  . potassium chloride (K-DUR) 10 MEQ tablet Take 10 mEq by mouth daily.    11/09/2014 at 0800  . pravastatin (PRAVACHOL) 40 MG tablet Take 1 tablet (40 mg total) by mouth daily. 90 tablet 3 11/08/2014 at 1500  . torsemide (DEMADEX) 20 MG tablet Take 40 mg in the morning and 20 mg in the afternoon. (Patient taking differently: Take 20 mg by mouth 2 (two) times daily. ) 180 tablet 3 11/08/2014 at Unknown time   Assessment: Pt with Urine Cx now growing E. Coli ESBL+ CrCl  = 18.4 ml/min  Goal of Therapy:  Resolution of infection  Plan:  Expected duration 7 days with resolution of temperature and/or normalization of WBC  Ertapenem 1 gm IV Q24H originally ordered.  Will adjust dose to Ertapenem 500 mg IV Q24H based on CrCl < 30 ml/min.  9/27: Scr 1.74  crcl 24.8. Will continue Ertapenem  IV Q24h   Bari Mantis PharmD Clinical Pharmacist 11/14/2014 2:28 PM

## 2014-11-14 NOTE — Progress Notes (Signed)
[name]   PATIENT NAME: [name]    MR#:  [id number]  DATE OF BIRTH:  09/22/1931  SUBJECTIVE:  CHIEF COMPLAINT:   Chief Complaint  Patient presents with  . Urinary Tract Infection   - Urine cultures growing ESBL E coli. Started on isolation and Invanz. -worsening weight gain, 3+ edema of lower extremities- will need lasix drip - nephrology consult today   REVIEW OF SYSTEMS:  Review of Systems  Constitutional: Negative for fever and chills.  Respiratory: Negative for cough, shortness of breath and wheezing.   Cardiovascular: Positive for leg swelling. Negative for chest pain and palpitations.  Gastrointestinal: Negative for nausea, vomiting, abdominal pain, diarrhea and constipation.  Genitourinary: Negative for dysuria.  Musculoskeletal: Positive for myalgias and neck pain.  Neurological: Positive for tremors. Negative for dizziness, seizures and headaches.    DRUG ALLERGIES:   Allergies  Allergen Reactions  . Contrast Media [Iodinated Diagnostic Agents] Shortness Of Breath  . Tramadol Nausea Only  . Valium [Diazepam] Other (See Comments)    Reaction:  Elevated heart rate  . Iodine Strong [Iodine] Rash  . Penicillins Rash    VITALS:  Blood pressure 120/60, pulse 65, temperature 97.7 F (36.5 C), temperature source Oral, resp. rate 18, height  (1.549 m), weight 83.235 kg (183 lb 8 oz), SpO2 99 %.  PHYSICAL EXAMINATION:  Physical Exam  GENERAL:  81 y.o.-year-old patient lying in the bed with no acute distress.  EYES: Pupils equal, round, reactive to light and accommodation. No scleral icterus. Extraocular muscles intact.  HEENT: Head atraumatic, normocephalic. Oropharynx and nasopharynx clear.  NECK:  Supple, no jugular venous distention. No thyroid enlargement, no tenderness. Improved pain of right trapezius and tender spot noted, no erythema or swelling on exam LUNGS: Normal breath sounds bilaterally,  no wheezing, rales,rhonchi or crepitation. No use of accessory muscles of respiration. Decreased bibasilar breath sounds CARDIOVASCULAR: S1, S2 normal. No murmurs, rubs, or gallops.  ABDOMEN: Soft, nontender, nondistended. Bowel sounds present. No organomegaly or mass.  EXTREMITIES: 2-3+ pedal edema, cyanosis, or clubbing. Edema upto abdominal wall noted. NEUROLOGIC: Cranial nerves II through XII are intact. Muscle strength 5/5 in all extremities. Upper extremities- proximal and distal muscles strength tested- normal, some myoclonic jerks noted prominent on left arm only when arms are above head level. Sensation intact. Gait not checked.  PSYCHIATRIC: The patient is alert and oriented x 3.  SKIN: No obvious rash, lesion, or ulcer.    LABORATORY PANEL:   CBC  Recent Labs Lab 11/09/14 1434  WBC 5.5  HGB 9.6*  HCT 31.8*  PLT 255   ------------------------------------------------------------------------------------------------------------------  Chemistries   Recent Labs Lab 11/09/14 1434  11/12/14 0422 11/13/14 0507  NA 137  < > 139 138  K 5.4*  < > 5.1 5.5*  CL 97*  < > 103 101  CO2 30  < > 31 33*  GLUCOSE 181*  < > 62* 105*  BUN 65*  < > 61* 55*  CREATININE 2.68*  < > 1.87* 1.74*  CALCIUM 8.5*  < > 8.1* 8.4*  MG  --   --  2.7*  --   AST 19  --   --   --   ALT 9*  --   --   --   ALKPHOS 78  --   --   --   BILITOT 0.5  --   --   --   < > = values in this  interval not displayed. ------------------------------------------------------------------------------------------------------------------  Cardiac Enzymes  Recent Labs Lab 11/09/14 1434  TROPONINI 0.05*   ------------------------------------------------------------------------------------------------------------------  RADIOLOGY:  Dg Chest 1 View  11/12/2014   CLINICAL DATA:  PICC line placement  EXAM: CHEST 1 VIEW  COMPARISON:  11/09/2014  FINDINGS: Right PICC line is in place with the tip at the cavoatrial  junction. Prior CABG. Cardiomegaly. Concern for bilateral airspace opacities, new since prior study. This could represent edema. No visible effusions.  IMPRESSION: Right PICC line tip at the cavoatrial junction.  Cardiomegaly. New diffuse bilateral airspace disease concerning for edema/CHF.   Electronically Signed   By: Charlett Nose M.D.   On: 11/12/2014 14:28    EKG:   Orders placed or performed during the hospital encounter of 11/09/14  . ED EKG 12-Lead  . ED EKG 12-Lead  . EKG 12-Lead  . EKG 12-Lead    ASSESSMENT AND PLAN:   81 year old [name] with past medical history of chronic systolic congestive heart failure, DJD with patient being bedbound for last 6 months, type 2 diabetes, hypertension, A. fib on a liquid comes to the emergency room after she was found to have elevated creatinine on her routine lab work that was done at her nursing home.  1. Acute on chronic renal failure-Baseline creatinine 1.8, creatinine back to baseline. -likely ATN. Improved now, back to baseline -Gentle hydration. -Renal calcifications likely vascular noted on previous CT scans in about 3 weeks ago. - renal ultrasound with medico-renal disease. Avoid nephrotoxins - nephrology consulted - started on lasix drip today  2. ESBL UTI- from urine cultures - on invanz starting 11/11/14 and also started contact isolation - PICC line and cont ABX for 10 days total  2. Chronic systolic CHF-last CT scan from 3 weeks ago showing ascites and abdominal wall edema. -Has chronic bilateral pleural effusions.  -lasix drip will be started, repeat ECHO -Continue to monitor closely  3. CAD status post CABG-stable. -Medications on hold due to hypotension  4. Paroxysmal atrial fibrillation-rate controlled. Coreg discontinued due to hypotension -Continue amiodarone. Also on eliquis for anticoagulation  5. Diabetes mellitus-decreased Lantus dose as patient was hypoglycemic this admission - and also on sliding  scale insulin  6. DVT prophylaxis-on eliquis  7. Myoclonic jerks- decrease gabapentin dose and monitor - physical therapy recommended for generalised weakness  Physical therapy consulted  All the records are reviewed and case discussed with Care Management/Social Workerr. Management plans discussed with the patient, family and they are in agreement.  CODE STATUS: Full Code  TOTAL TIME TAKING CARE OF THIS PATIENT: 40 minutes.   POSSIBLE D/C WHEN STABLE, DEPENDING ON CLINICAL CONDITION.   Enid Baas M.D on 11/14/2014 at 12:48 PM  Between 7am to 6pm - Pager - [phone]  After 6pm go to [email or web address] - password [email or web address] [facility]  [place]  Hospitalists  Office  [phone]  CC: Primary care physician; Fidel Levy, MD

## 2014-11-15 ENCOUNTER — Inpatient Hospital Stay: Payer: Medicare Other

## 2014-11-15 LAB — BASIC METABOLIC PANEL
ANION GAP: 9 (ref 5–15)
Anion gap: 5 (ref 5–15)
BUN: 60 mg/dL — AB (ref 6–20)
BUN: 63 mg/dL — ABNORMAL HIGH (ref 6–20)
CALCIUM: 8.1 mg/dL — AB (ref 8.9–10.3)
CO2: 32 mmol/L (ref 22–32)
CO2: 34 mmol/L — AB (ref 22–32)
CREATININE: 1.75 mg/dL — AB (ref 0.44–1.00)
Calcium: 8.4 mg/dL — ABNORMAL LOW (ref 8.9–10.3)
Chloride: 95 mmol/L — ABNORMAL LOW (ref 101–111)
Chloride: 98 mmol/L — ABNORMAL LOW (ref 101–111)
Creatinine, Ser: 1.79 mg/dL — ABNORMAL HIGH (ref 0.44–1.00)
GFR calc Af Amer: 29 mL/min — ABNORMAL LOW (ref >60)
GFR calc non Af Amer: 26 mL/min — ABNORMAL LOW (ref >60)
GFR, EST AFRICAN AMERICAN: 30 mL/min — AB (ref >60)
GFR, EST NON AFRICAN AMERICAN: 25 mL/min — AB (ref >60)
Glucose, Bld: 126 mg/dL — ABNORMAL HIGH (ref 65–99)
Glucose, Bld: 128 mg/dL — ABNORMAL HIGH (ref 65–99)
POTASSIUM: 5.7 mmol/L — AB (ref 3.5–5.1)
Potassium: 5.1 mmol/L (ref 3.5–5.1)
SODIUM: 136 mmol/L (ref 135–145)
Sodium: 137 mmol/L (ref 135–145)

## 2014-11-15 LAB — CBC
HCT: 29.1 % — ABNORMAL LOW (ref 35.0–47.0)
HEMOGLOBIN: 8.7 g/dL — AB (ref 12.0–16.0)
MCH: 24 pg — ABNORMAL LOW (ref 26.0–34.0)
MCHC: 30 g/dL — AB (ref 32.0–36.0)
MCV: 80 fL (ref 80.0–100.0)
Platelets: 252 10*3/uL (ref 150–440)
RBC: 3.64 MIL/uL — AB (ref 3.80–5.20)
RDW: 24.9 % — ABNORMAL HIGH (ref 11.5–14.5)
WBC: 4.1 10*3/uL (ref 3.6–11.0)

## 2014-11-15 LAB — URINALYSIS COMPLETE WITH MICROSCOPIC (ARMC ONLY)
BACTERIA UA: NONE SEEN
Bilirubin Urine: NEGATIVE
Glucose, UA: NEGATIVE mg/dL
KETONES UR: NEGATIVE mg/dL
NITRITE: NEGATIVE
PROTEIN: NEGATIVE mg/dL
Specific Gravity, Urine: 1.011 (ref 1.005–1.030)
pH: 5 (ref 5.0–8.0)

## 2014-11-15 LAB — GLUCOSE, CAPILLARY
GLUCOSE-CAPILLARY: 101 mg/dL — AB (ref 65–99)
GLUCOSE-CAPILLARY: 187 mg/dL — AB (ref 65–99)
GLUCOSE-CAPILLARY: 148 mg/dL — AB (ref 65–99)
Glucose-Capillary: 163 mg/dL — ABNORMAL HIGH (ref 65–99)

## 2014-11-15 MED ORDER — SODIUM POLYSTYRENE SULFONATE 15 GM/60ML PO SUSP
30.0000 g | Freq: Once | ORAL | Status: AC
  Administered 2014-11-15: 30 g via ORAL
  Filled 2014-11-15: qty 120

## 2014-11-15 MED ORDER — ATORVASTATIN CALCIUM 20 MG PO TABS
40.0000 mg | ORAL_TABLET | Freq: Every day | ORAL | Status: DC
  Administered 2014-11-15 – 2014-12-02 (×18): 40 mg via ORAL
  Filled 2014-11-15 (×19): qty 2

## 2014-11-15 NOTE — Progress Notes (Signed)
[facility] [place] [facility]   PATIENT NAME: [name]    MR#:  [id number]  DATE OF BIRTH:  October 09, 1931  SUBJECTIVE:  CHIEF COMPLAINT:   Chief Complaint  Patient presents with  . Urinary Tract Infection   - continues on lasix drip, with improvement in her edema - urine output being monitored- marginal so far - continue lasix drip for today   REVIEW OF SYSTEMS:  Review of Systems  Constitutional: Negative for fever and chills.  Respiratory: Negative for cough, shortness of breath and wheezing.   Cardiovascular: Positive for leg swelling. Negative for chest pain and palpitations.  Gastrointestinal: Negative for nausea, vomiting, abdominal pain, diarrhea and constipation.  Genitourinary: Negative for dysuria.  Musculoskeletal: Positive for neck pain. Negative for myalgias.  Neurological: Positive for tremors. Negative for dizziness, seizures and headaches.    DRUG ALLERGIES:   Allergies  Allergen Reactions  . Contrast Media [Iodinated Diagnostic Agents] Shortness Of Breath  . Tramadol Nausea Only  . Valium [Diazepam] Other (See Comments)    Reaction:  Elevated heart rate  . Iodine Strong [Iodine] Rash  . Penicillins Rash    VITALS:  Blood pressure 113/52, pulse 61, temperature 97.1 F (36.2 C), temperature source Tympanic, resp. rate 18, height  (1.549 m), weight 81.012 kg (178 lb 9.6 oz), SpO2 100 %.  PHYSICAL EXAMINATION:  Physical Exam  GENERAL:  81 y.o.-year-old patient lying in the bed with no acute distress.  EYES: Pupils equal, round, reactive to light and accommodation. No scleral icterus. Extraocular muscles intact.  HEENT: Head atraumatic, normocephalic. Oropharynx and nasopharynx clear.  NECK:  Supple, no jugular venous distention. No thyroid enlargement, no tenderness.no erythema or swelling on exam LUNGS: Normal breath sounds bilaterally, no wheezing, rales,rhonchi or crepitation. No use of accessory muscles of  respiration. Decreased bibasilar breath sounds CARDIOVASCULAR: S1, S2 normal. No rubs, or gallops. 3/6 systolic murmur noted ABDOMEN: Soft, nontender, nondistended. Bowel sounds present. No organomegaly or mass.  EXTREMITIES: 2-3+ pedal edema, cyanosis, or clubbing. Edema upto abdominal wall noted. NEUROLOGIC: Cranial nerves II through XII are intact. Muscle strength 5/5 in all extremities. Upper extremities- proximal and distal muscles strength  normal, myoclonic jerks improving. Sensation intact. Gait not checked.  PSYCHIATRIC: The patient is alert and oriented x 3.  SKIN: No obvious rash, lesion, or ulcer.    LABORATORY PANEL:   CBC  Recent Labs Lab 11/15/14 1013  WBC 4.1  HGB 8.7*  HCT 29.1*  PLT 252   ------------------------------------------------------------------------------------------------------------------  Chemistries   Recent Labs Lab 11/09/14 1434  11/12/14 0422  11/15/14 0443  NA 137  < > 139  < > 136  K 5.4*  < > 5.1  < > 5.7*  CL 97*  < > 103  < > 95*  CO2 30  < > 31  < > 32  GLUCOSE 181*  < > 62*  < > 126*  BUN 65*  < > 61*  < > 60*  CREATININE 2.68*  < > 1.87*  < > 1.79*  CALCIUM 8.5*  < > 8.1*  < > 8.4*  MG  --   --  2.7*  --   --   AST 19  --   --   --   --   ALT 9*  --   --   --   --   ALKPHOS 78  --   --   --   --   BILITOT 0.5  --   --   --   --   < > =  values in this interval not displayed. ------------------------------------------------------------------------------------------------------------------  Cardiac Enzymes  Recent Labs Lab 11/09/14 1434  TROPONINI 0.05*   ------------------------------------------------------------------------------------------------------------------  RADIOLOGY:  No results found.  EKG:   Orders placed or performed during the hospital encounter of 11/09/14  . ED EKG 12-Lead  . ED EKG 12-Lead  . EKG 12-Lead  . EKG 12-Lead    ASSESSMENT AND PLAN:   81 year old [name] with past medical  history of chronic systolic congestive heart failure, DJD with patient being bedbound for last 6 months, type 2 diabetes, hypertension, A. fib on a liquid comes to the emergency room after she was found to have elevated creatinine on her routine lab work that was done at her nursing home.  1. Acute on chronic renal failure-Baseline creatinine 1.8, creatinine back to baseline. - on lasix drip currently,. Appreciate nephrology consult -Renal calcifications likely vascular noted on previous CT scans in about 3 weeks ago. - renal ultrasound with medico-renal disease. Avoid nephrotoxins - nephrology consulted  2. ESBL UTI- from urine cultures - on invanz starting 11/11/14 and also started contact isolation - PICC line and cont ABX for 10 days total  2. Acute on Chronic systolic CHF with anasarca-worsened in the last 4 weeks -Has chronic bilateral pleural effusions.  -lasix drip started, ECHO with EF 20%, hypokinesis of several areas -Continue lasix drip for another day, monitor urine output closely - patient follows with Dr. Kirke Corin as outpatient  3. CAD status post CABG-stable. -Medications on hold due to hypotension BP is low normal. No ACEI/ARB due to hypotension and ARF  4. Paroxysmal atrial fibrillation-rate controlled. Coreg discontinued due to hypotension -Continue amiodarone. Also on eliquis for anticoagulation  5. Diabetes mellitus-decreased Lantus dose as patient was hypoglycemic this admission - and also on sliding scale insulin  6. DVT prophylaxis-on eliquis  7. Hyperkalemia- kayexalate today, on lasix drip as well - not on any supplements also. monitor  Physical therapy consulted  All the records are reviewed and case discussed with Care Management/Social Workerr. Management plans discussed with the patient, family and they are in agreement.  CODE STATUS: Full Code  TOTAL TIME TAKING CARE OF THIS PATIENT: 40 minutes.   POSSIBLE D/C on Friday to  [facility].   Enid Baas M.D on 11/15/2014 at 12:10 PM  Between 7am to 6pm - Pager - [phone]  After 6pm go to [email or web address] - password [email or web address] [facility]  [place] Hospital[facility]  Office  [phone]  CC: Primary care physician; Fidel Levy, MD

## 2014-11-15 NOTE — Progress Notes (Signed)
Spoke with Dr. Sheryle Hail regarding telemetry order, reported to me that telemetry was discontinued however order still in computer.  MD aware, out of telemetry boxes hospital wide, he reports with re evaluate with day shift to see who needs tele and who is being discharged.

## 2014-11-15 NOTE — Plan of Care (Addendum)
Problem: Discharge Progression Outcomes Goal: Other Discharge Outcomes/Goals Outcome: Not Progressing VSS, Hypotension improved since coreg held, Continues to have chronic hip pain requiring PRN coverage, remains on lasix drip and foley with urine output monitored, Creatinine stable at this time

## 2014-11-15 NOTE — Care Management Important Message (Signed)
Important Message  Patient Details  Name: [name] MRN: [id number] Date of Birth: 11/22/1931   Medicare Important Message Given:  Yes-fourth notification given    Gwenette Greet, RN 11/15/2014, 2:02 PM

## 2014-11-15 NOTE — Plan of Care (Signed)
Problem: Discharge Progression Outcomes Goal: Other Discharge Outcomes/Goals Outcome: Progressing 1. Pain-pt c/o neck and shoulder pain, prn meds given with improvement 2. Hemodynamically-             -VSS, pt remains afebrile this shift             -IV fluids continue per orders             -IV ABT continue per orders             -IV Lasix continue per orders 3. Complications-no evidence this shift 4. Diet-pt tolerating diet this shift 5. Activity-pt has been bed bound for the past 6 mos.

## 2014-11-15 NOTE — Plan of Care (Addendum)
Problem: Discharge Progression Outcomes Goal: Other Discharge Outcomes/Goals Outcome: Progressing 1. Pain-pt c/o neck and shoulder pain, prn meds given with improvement 2. Hemodynamically-             -VSS, pt remains afebrile this shift             -IV fluids continue per orders             -IV ABT continue per orders  -IV Lasix continues per orders 3. Complications-no evidence this shift 4. Diet-pt tolerating diet this shift 5. Activity-pt has been bed bound for the past 6 mos.

## 2014-11-15 NOTE — Progress Notes (Signed)
Patient been increasingly sleepy today and this evening with me, noticed Patient has developed non productive cough, and urine is dark in color.  Prime MD called and ordered Chest xray, U/A, and Met B to recheck potassium level.  Chest x ray showed improving pulmonary edema, positive for blood in urine, potassium has improved.  Started patient back on telemetry monitoring with continuous pulse ox.  Will continue to closely monitor.

## 2014-11-15 NOTE — Progress Notes (Signed)
Subjective:  Patient is overall doing well. Starting to diurese good. Serum creatinine 1.79 Potassium slightly elevated at 5.7 Urine output seems to be improving this morning No nausea or vomiting reported  Objective:  Vital signs in last 24 hours:  Temp:  [97.1 F (36.2 C)-97.7 F (36.5 C)] 97.1 F (36.2 C) (09/28 0511) Pulse Rate:  [63-71] 63 (09/28 0511) Resp:  [18] 18 (09/28 0511) BP: (112-128)/(45-58) 112/45 mmHg (09/28 0511) SpO2:  [95 %-100 %] 100 % (09/28 0511) Weight:  [81.012 kg (178 lb 9.6 oz)] 81.012 kg (178 lb 9.6 oz) (09/28 0511)  Weight change: -2.223 kg (-4 lb 14.4 oz) Filed Weights   11/13/14 0555 11/14/14 0512 11/15/14 0511  Weight: 82.056 kg (180 lb 14.4 oz) 83.235 kg (183 lb 8 oz) 81.012 kg (178 lb 9.6 oz)    Intake/Output:    Intake/Output Summary (Last 24 hours) at 11/15/14 0940 Last data filed at 11/14/14 1956  Gross per 24 hour  Intake    121 ml  Output    350 ml  Net   -229 ml     Physical Exam: General:  elderly, frail, laying in the bed   HEENT  moist mucous membranes, anicteric sclera   Neck  supple, no masses   Pulm/lungs  mild basilar crackles, normal effort   CVS/Heart  irregular rhythm   Abdomen:   soft, nontender, nondistended   Extremities:  2-3+ dependent edema   Neurologic:  alert, oriented, speech normal   Skin:  no acute rashes, skin breakdown over shins   Access:     Foley in place     Basic Metabolic Panel:   Recent Labs Lab 11/10/14 0535 11/11/14 0517 11/12/14 0422 11/13/14 0507 11/15/14 0443  NA 137 140 139 138 136  K 5.5* 5.1 5.1 5.5* 5.7*  CL 101 101 103 101 95*  CO2 30 33* 31 33* 32  GLUCOSE 171* 86 62* 105* 126*  BUN 65* 64* 61* 55* 60*  CREATININE 2.50* 2.30* 1.87* 1.74* 1.79*  CALCIUM 8.0* 8.3* 8.1* 8.4* 8.4*  MG  --   --  2.7*  --   --      CBC:  Recent Labs Lab 11/09/14 1434  WBC 5.5  NEUTROABS 3.9  HGB 9.6*  HCT 31.8*  MCV 78.7*  PLT 255      Microbiology:  Recent Results  (from the past 720 hour(s))  Blood culture (routine x 2)     Status: None   Collection Time: 10/17/14  9:07 AM  Result Value Ref Range Status   Specimen Description BLOOD LEFT ASSIST CONTROL  Final   Special Requests BOTTLES DRAWN AEROBIC AND ANAEROBIC  1 CC  Final   Culture NO GROWTH 5 DAYS  Final   Report Status 10/22/2014 FINAL  Final  Blood culture (routine x 2)     Status: None   Collection Time: 10/17/14  9:07 AM  Result Value Ref Range Status   Specimen Description BLOOD RIGHT ASSIST CONTROL  Final   Special Requests BOTTLES DRAWN AEROBIC AND ANAEROBIC  1 CC  Final   Culture NO GROWTH 5 DAYS  Final   Report Status 10/22/2014 FINAL  Final  Urine culture     Status: None   Collection Time: 10/17/14  5:51 PM  Result Value Ref Range Status   Specimen Description URINE, CLEAN CATCH  Final   Special Requests NONE  Final   Culture 20,000 COLONIES/mL CANDIDA ALBICANS  Final   Report Status 10/20/2014 FINAL  Final  MRSA PCR Screening     Status: None   Collection Time: 10/17/14  5:51 PM  Result Value Ref Range Status   MRSA by PCR NEGATIVE NEGATIVE Final    Comment:        The GeneXpert MRSA Assay (FDA approved for NASAL specimens only), is one component of a comprehensive MRSA colonization surveillance program. It is not intended to diagnose MRSA infection nor to guide or monitor treatment for MRSA infections.   Blood Culture (routine x 2)     Status: None   Collection Time: 11/09/14  2:34 PM  Result Value Ref Range Status   Specimen Description BLOOD RIGHT ARM  Final   Special Requests BOTTLES DRAWN AEROBIC AND ANAEROBIC 3CC  Final   Culture NO GROWTH 5 DAYS  Final   Report Status 11/14/2014 FINAL  Final  Urine culture     Status: None   Collection Time: 11/09/14  2:34 PM  Result Value Ref Range Status   Specimen Description URINE, CLEAN CATCH  Final   Special Requests NONE  Final   Culture   Final    >=100,000 COLONIES/mL ESCHERICHIA COLI ESBL-EXTENDED SPECTRUM  BETA LACTAMASE-THE ORGANISM IS RESISTANT TO PENICILLINS, CEPHALOSPORINS AND AZTREONAM ACCORDING TO CLSI M100-S15 VOL.25 N01 JAN 2005. CRITICAL RESULT CALLED TO, READ BACK BY AND VERIFIED WITH: DANIEL JACOBS,RN 11/11/2014 0909 JRS.    Report Status 11/11/2014 FINAL  Final   Organism ID, Bacteria ESCHERICHIA COLI  Final      Susceptibility   Escherichia coli - MIC*    AMPICILLIN >=32 RESISTANT Resistant     CEFAZOLIN >=64 RESISTANT Resistant     CEFTRIAXONE >=64 RESISTANT Resistant     CIPROFLOXACIN >=4 RESISTANT Resistant     GENTAMICIN <=1 SENSITIVE Sensitive     IMIPENEM <=0.25 SENSITIVE Sensitive     NITROFURANTOIN <=16 SENSITIVE Sensitive     TRIMETH/SULFA >=320 RESISTANT Resistant     Extended ESBL POSITIVE Resistant     PIP/TAZO Value in next row Sensitive      SENSITIVE8    LEVOFLOXACIN Value in next row Resistant      RESISTANT>=8    * >=100,000 COLONIES/mL ESCHERICHIA COLI  MRSA PCR Screening     Status: None   Collection Time: 11/09/14  6:24 PM  Result Value Ref Range Status   MRSA by PCR NEGATIVE NEGATIVE Final    Comment:        The GeneXpert MRSA Assay (FDA approved for NASAL specimens only), is one component of a comprehensive MRSA colonization surveillance program. It is not intended to diagnose MRSA infection nor to guide or monitor treatment for MRSA infections.     Coagulation Studies: No results for input(s): LABPROT, INR in the last 72 hours.  Urinalysis: No results for input(s): COLORURINE, LABSPEC, PHURINE, GLUCOSEU, HGBUR, BILIRUBINUR, KETONESUR, PROTEINUR, UROBILINOGEN, NITRITE, LEUKOCYTESUR in the last 72 hours.  Invalid input(s): APPERANCEUR    Imaging: No results found.   Medications:   . furosemide (LASIX) infusion 4 mg/hr (11/14/14 1213)   . acidophilus  1 capsule Oral BID  . amiodarone  200 mg Oral Daily  . apixaban  2.5 mg Oral BID  . docusate sodium  100 mg Oral BID  . ertapenem  500 mg Intravenous Q24H  . gabapentin  400 mg  Oral BID  . insulin aspart  0-5 Units Subcutaneous QHS  . insulin aspart  0-9 Units Subcutaneous TID WC  . insulin glargine  22 Units Subcutaneous QHS  . levothyroxine  100 mcg Oral QAC breakfast  . lubiprostone  24 mcg Oral BID WC  . nystatin cream   Topical BID  . pravastatin  40 mg Oral Daily  . sodium chloride  3 mL Intravenous Q12H   acetaminophen **OR** acetaminophen, albuterol, alum & mag hydroxide-simeth, HYDROcodone-acetaminophen, nitroGLYCERIN, ondansetron **OR** ondansetron (ZOFRAN) IV, polyethylene glycol, sodium chloride  Assessment/ Plan:  81 y.o. female with a PMHX of chronic systolic congestive heart failure, type 2 diabetes, hypertension, atrial fibrillation, DJD, COPD, coronary disease/CABG 1988, catheter 2015, recurrent UTIs, chronic kidney disease, who was admitted to [facility] on 11/09/2014 for evaluation of elevated creatinine above her baseline and increased swelling.   1. Acute renal failure on chronic kidney disease stage 4  . Patient's baseline creatinine is 1.8/GFR of 25. CKD is likely secondary to atherosclerosis. Acute worsening in creatinine is likely secondary to recent UTI from ESBL possibly leading to volume issues and ATN. Serum creatinine has improved close to baseline at 1.7/GFR 26. 2. Anasarca. Likely from systolic congestive heart failure. Continue on IV Lasix infusion. She will need a Foley catheter for the duration of diuresis. Monitor electrolytes Echo shows severely reduced LVEF 20-25%, diffuse hypokinesis, moderate TR, moderately elevated pulmonary artery pressure at 46 mm Hg   LOS: 6 SINGH,HARMEET 9/28/20169:40 AM

## 2014-11-16 LAB — GLUCOSE, CAPILLARY
GLUCOSE-CAPILLARY: 115 mg/dL — AB (ref 65–99)
Glucose-Capillary: 77 mg/dL (ref 65–99)
Glucose-Capillary: 109 mg/dL — ABNORMAL HIGH (ref 65–99)
Glucose-Capillary: 177 mg/dL — ABNORMAL HIGH (ref 65–99)

## 2014-11-16 LAB — BASIC METABOLIC PANEL
Anion gap: 5 (ref 5–15)
BUN: 58 mg/dL — AB (ref 6–20)
CHLORIDE: 99 mmol/L — AB (ref 101–111)
CO2: 34 mmol/L — AB (ref 22–32)
CREATININE: 1.66 mg/dL — AB (ref 0.44–1.00)
Calcium: 8.1 mg/dL — ABNORMAL LOW (ref 8.9–10.3)
GFR calc Af Amer: 32 mL/min — ABNORMAL LOW (ref >60)
GFR calc non Af Amer: 28 mL/min — ABNORMAL LOW (ref >60)
GLUCOSE: 97 mg/dL (ref 65–99)
Potassium: 4.7 mmol/L (ref 3.5–5.1)
Sodium: 138 mmol/L (ref 135–145)

## 2014-11-16 LAB — CBC
HCT: 28.7 % — ABNORMAL LOW (ref 35.0–47.0)
Hemoglobin: 8.7 g/dL — ABNORMAL LOW (ref 12.0–16.0)
MCH: 24.2 pg — AB (ref 26.0–34.0)
MCHC: 30.4 g/dL — AB (ref 32.0–36.0)
MCV: 79.5 fL — AB (ref 80.0–100.0)
PLATELETS: 252 10*3/uL (ref 150–440)
RBC: 3.61 MIL/uL — AB (ref 3.80–5.20)
RDW: 24.2 % — ABNORMAL HIGH (ref 11.5–14.5)
WBC: 4.8 10*3/uL (ref 3.6–11.0)

## 2014-11-16 NOTE — Progress Notes (Signed)
[facility] [place] [facility]   PATIENT NAME: [name]    MR#:  [id number]  DATE OF BIRTH:  02/07/1932  SUBJECTIVE:  Patient had a bowel movement. She was complaining constipation earlier today. She is still on Lasix drip with improvement in her edema REVIEW OF SYSTEMS:    Review of Systems  Constitutional: Negative for fever, chills and malaise/fatigue.  HENT: Negative for sore throat.   Eyes: Negative for blurred vision.  Respiratory: Positive for shortness of breath. Negative for cough, hemoptysis and wheezing.   Cardiovascular: Positive for leg swelling. Negative for chest pain and palpitations.  Gastrointestinal: Negative for nausea, vomiting, abdominal pain, diarrhea and blood in stool.  Genitourinary: Negative for dysuria.  Musculoskeletal: Negative for back pain.  Neurological: Negative for dizziness, tremors and headaches.  Endo/Heme/Allergies: Does not bruise/bleed easily.    Tolerating Diet: Yes      DRUG ALLERGIES:   Allergies  Allergen Reactions  . Contrast Media [Iodinated Diagnostic Agents] Shortness Of Breath  . Tramadol Nausea Only  . Valium [Diazepam] Other (See Comments)    Reaction:  Elevated heart rate  . Iodine Strong [Iodine] Rash  . Penicillins Rash    VITALS:  Blood pressure 115/47, pulse 73, temperature 98.2 F (36.8 C), temperature source Oral, resp. rate 18, height  (1.549 m), weight 80.332 kg (177 lb 1.6 oz), SpO2 100 %.  PHYSICAL EXAMINATION:   Physical Exam  Constitutional: She is oriented to person, place, and time and well-developed, well-nourished, and in no distress. No distress.  HENT:  Head: Normocephalic.  Eyes: No scleral icterus.  Neck: Normal range of motion. Neck supple. No JVD present. No tracheal deviation present.  Cardiovascular: Normal rate, regular rhythm and normal heart sounds.  Exam reveals no gallop and no friction rub.   No murmur heard. Pulmonary/Chest: Effort  normal and breath sounds normal. No respiratory distress. She has no wheezes. She has no rales. She exhibits no tenderness.  Abdominal: Soft. Bowel sounds are normal. She exhibits no distension and no mass. There is no tenderness. There is no rebound and no guarding.  Musculoskeletal: Normal range of motion. She exhibits edema.  Neurological: She is alert and oriented to person, place, and time.  Skin: Skin is warm. No rash noted. No erythema.  Psychiatric: Affect and judgment normal.      LABORATORY PANEL:   CBC  Recent Labs Lab 11/16/14 0527  WBC 4.8  HGB 8.7*  HCT 28.7*  PLT 252   ------------------------------------------------------------------------------------------------------------------  Chemistries   Recent Labs Lab 11/09/14 1434  11/12/14 0422  11/16/14 0527  NA 137  < > 139  < > 138  K 5.4*  < > 5.1  < > 4.7  CL 97*  < > 103  < > 99*  CO2 30  < > 31  < > 34*  GLUCOSE 181*  < > 62*  < > 97  BUN 65*  < > 61*  < > 58*  CREATININE 2.68*  < > 1.87*  < > 1.66*  CALCIUM 8.5*  < > 8.1*  < > 8.1*  MG  --   --  2.7*  --   --   AST 19  --   --   --   --   ALT 9*  --   --   --   --   ALKPHOS 78  --   --   --   --   BILITOT 0.5  --   --   --   --   < > =  values in this interval not displayed. ------------------------------------------------------------------------------------------------------------------  Cardiac Enzymes  Recent Labs Lab 11/09/14 1434  TROPONINI 0.05*   ------------------------------------------------------------------------------------------------------------------  RADIOLOGY:  Dg Chest Port 1 View  11/15/2014   CLINICAL DATA:  Cough and congestion, onset this evening.  EXAM: PORTABLE CHEST 1 VIEW  COMPARISON:  Chest radiograph 11/12/2014  FINDINGS: Tip of the right upper extremity PICC in the distal SVC. Patient is post median sternotomy. There is stable cardiomegaly. Diminished bilateral airspace disease from prior exam, likely improved  edema. Mild right infrahilar and left suprahilar atelectasis. Limited left basilar assessment scenario soft tissue attenuation. Chronic deformity about the left proximal humerus, unchanged.  IMPRESSION: 1. Findings suggest resolving pulmonary edema.  Stable cardiomegaly. 2. Tip of the right upper extremity PICC in the distal SVC.   Electronically Signed   By: Rubye Oaks M.D.   On: 11/15/2014 21:58     ASSESSMENT AND PLAN:   81 year old female with a history of chronic systolic heart failure, DJD with functional quadriplegia, type 2 diabetes and hypertension who presented with elevation in her creatinine and edema.  1. Acute on chronic renal failure: Patient's cranium has much improved and is at baseline. Patient is currently on Lasix drip. Nephrology is following the patient in consultation. Renal ultrasound showed medical renal disease. Continue to avoid nephrotoxic agents. Her acute worsening in creatinine is likely secondary to recent UTI from ESBL leading to volume issues and ATN. 2. ESBL urinary tract infection: Patient is currently on Invanz which was started on September 24. She has PICC line and will need 10 days of antibiotics.  3. Acute on chronic systolic heart failure with anasarca: Patient's currently on Lasix drip. Her echocardiogram showed EF of 20% with hypokinesis of several areas. Patient will continue Lasix drip for another 24 hours.  4. CAD status post CABG: Patient is not on ACE inhibitor/ARB due to hypotension and acute renal failure. Blood pressure is low normal in the setting of using Lasix drip.  5. Atrial fibrillation: Patient's heart rate is controlled. Patient will continue on Eliquis and amiodarone.  6. Diabetes type 2: Continue Lantus and sliding scale insulin.      Management plans discussed with the patient and she is in agreement.  CODE STATUS: Full  TOTAL TIME TAKING CARE OF THIS PATIENT: 28 minutes.     POSSIBLE D/C tomorrow, DEPENDING ON  CLINICAL CONDITION.   MODY, SITAL M.D on 11/16/2014 at 12:03 PM  Between 7am to 6pm - Pager - [phone] After 6pm go to [email or web address] - password [email or web address] [facility]  [facility]  Office  [phone]  CC: Primary care physician; Fidel Levy, MD

## 2014-11-16 NOTE — Progress Notes (Signed)
Nutrition Follow-up   INTERVENTION:   Meals and Snacks: Cater to patient preferences Medical Food Supplement Therapy: Continue Mighty Shakes as ordered as pt is drinking and likes   NUTRITION DIAGNOSIS:   Inadequate oral intake related to poor appetite as evidenced by per patient/family report.  GOAL:   Patient will meet greater than or equal to 90% of their needs; ongoing  MONITOR:    (Energy Intake, Anthropometrics, Electrolyte and Renal Profile, digestive system)    ASSESSMENT:   Pt admitted with acute renal failure secondary to prerenal azotemia. Per MD note pt with likely systolic CHF. Nephrology following. Pt on strict I/O.   Diet Order:  Diet 2 gram sodium Room service appropriate?: Yes; Fluid consistency:: Thin    Current Nutrition: Pt ate 90% of breakfast this am including grits, peaches and a bagel. Pt reports better appetite this am. Pt reports eating about half of fish and bread.    Medications: Novolog, Colace, Novolog,lantus, lasix drip  Electrolyte/Renal Profile and Glucose Profile:   Recent Labs Lab 11/12/14 0422  11/15/14 0443 11/15/14 2157 11/16/14 0527  NA 139  < > 136 137 138  K 5.1  < > 5.7* 5.1 4.7  CL 103  < > 95* 98* 99*  CO2 31  < > 32 34* 34*  BUN 61*  < > 60* 63* 58*  CREATININE 1.87*  < > 1.79* 1.75* 1.66*  CALCIUM 8.1*  < > 8.4* 8.1* 8.1*  MG 2.7*  --   --   --   --   GLUCOSE 62*  < > 126* 128* 97  < > = values in this interval not displayed. Protein Profile:  Recent Labs Lab 11/09/14 1434  ALBUMIN 3.3*    Gastrointestinal Profile: Last BM: 11/16/2014 UOP: in last 24 hours  Weight Change:  Filed Weights   11/14/14 0512 11/15/14 0511 11/16/14 0630  Weight: 183 lb 8 oz (83.235 kg) 178 lb 9.6 oz (81.012 kg) 177 lb 1.6 oz (80.332 kg)    Weight loss noted.    BMI:  Body mass index is 33.48 kg/(m^2).  Estimated Nutritional Needs:   Kcal:  using IBW of 48kg, BEE: 882kcals, TEE: (IF 1.0-1.2)(AF 1.3)  1146-1490kcals  Protein:  using IBW of 48kg, 48-58g protein (1.0-1.2g/kg)  Fluid:  using IBW of 48kg, (25-45mL/kg) 1440-1668mL/kg  EDUCATION NEEDS:   No education needs identified at this time   MODERATE Care Level  Leda Quail, RD, LDN Pager [phone]

## 2014-11-16 NOTE — Progress Notes (Signed)
Patient discussed in unit rounds and is awaiting medical clearance for return to [facility]. WOC consult pending.   Reece Levy, MSW, Theresia Majors  [phone]

## 2014-11-16 NOTE — Plan of Care (Signed)
Problem: Discharge Progression Outcomes Goal: Other Discharge Outcomes/Goals Outcome: Not Progressing Patient remains on Isolation for ESBL in urine, Patient remains alert and oriented, continues to have foley and continuous lasix drip at 10ml/hr, generalized edema has improved, daily weights improving, Hg starting to trend down, latest U/A positive for blood, urine very dark with sediment, Patient unit draw from right upper arm PICC, Patient had chest xray last night due to new non productive cough results showed improved pulmonary edema, will continue to monitor Hg in blood work, discharge plan for snf with IV abx through PICC.

## 2014-11-16 NOTE — Plan of Care (Signed)
Problem: Discharge Progression Outcomes Goal: Discharge plan in place and appropriate Individualization of care  Outcome: Not Progressing Pt remains on lasix drip. Foley patent and pt diuresing Goal: Barriers To Progression Addressed/Resolved Outcome: Not Progressing bedrest Goal: Pain controlled with appropriate interventions Outcome: Progressing Calls for pain med po  X 1 this shift.  Med for nausea w/o vomiting and med for indigestion. Pt  Belching frequently Goal: Hemodynamically stable Outcome: Progressing vss Goal: Tolerating diet Outcome: Not Progressing tol only small amt of  Meals today

## 2014-11-16 NOTE — Progress Notes (Signed)
Subjective:  Patient is overall doing well. Starting to diurese good. Serum creatinine 1.66 today Potassium level is now connected Urine output seems to be improving. Recorded at 1250 cc last 24 hours No nausea or vomiting reported  Objective:  Vital signs in last 24 hours:  Temp:  [97.4 F (36.3 C)-98.2 F (36.8 C)] 98.2 F (36.8 C) (09/29 0936) Pulse Rate:  [65-73] 73 (09/29 0936) Resp:  [18-19] 18 (09/29 0503) BP: (115-132)/(47-57) 115/47 mmHg (09/29 0936) SpO2:  [96 %-100 %] 100 % (09/29 0936) Weight:  [80.332 kg (177 lb 1.6 oz)] 80.332 kg (177 lb 1.6 oz) (09/29 0630)  Weight change: -0.68 kg (-1 lb 8 oz) Filed Weights   11/14/14 0512 11/15/14 0511 11/16/14 0630  Weight: 83.235 kg (183 lb 8 oz) 81.012 kg (178 lb 9.6 oz) 80.332 kg (177 lb 1.6 oz)    Intake/Output:    Intake/Output Summary (Last 24 hours) at 11/16/14 1213 Last data filed at 11/16/14 1011  Gross per 24 hour  Intake    250 ml  Output   1250 ml  Net  -1000 ml     Physical Exam: General:  elderly, frail, laying in the bed   HEENT  moist mucous membranes, anicteric sclera   Neck  supple, no masses   Pulm/lungs  mild basilar crackles, normal effort   CVS/Heart  irregular rhythm   Abdomen:   soft, nontender, nondistended   Extremities:  2-3+ dependent edema   Neurologic:  alert, oriented, speech normal   Skin:  no acute rashes, skin breakdown over shins   Access:     Foley in place     Basic Metabolic Panel:   Recent Labs Lab 11/12/14 0422 11/13/14 0507 11/15/14 0443 11/15/14 2157 11/16/14 0527  NA 139 138 136 137 138  K 5.1 5.5* 5.7* 5.1 4.7  CL 103 101 95* 98* 99*  CO2 31 33* 32 34* 34*  GLUCOSE 62* 105* 126* 128* 97  BUN 61* 55* 60* 63* 58*  CREATININE 1.87* 1.74* 1.79* 1.75* 1.66*  CALCIUM 8.1* 8.4* 8.4* 8.1* 8.1*  MG 2.7*  --   --   --   --      CBC:  Recent Labs Lab 11/09/14 1434 11/15/14 1013 11/16/14 0527  WBC 5.5 4.1 4.8  NEUTROABS 3.9  --   --   HGB 9.6* 8.7*  8.7*  HCT 31.8* 29.1* 28.7*  MCV 78.7* 80.0 79.5*  PLT 255 252 252      Microbiology:  Recent Results (from the past 720 hour(s))  Urine culture     Status: None   Collection Time: 10/17/14  5:51 PM  Result Value Ref Range Status   Specimen Description URINE, CLEAN CATCH  Final   Special Requests NONE  Final   Culture 20,000 COLONIES/mL CANDIDA ALBICANS  Final   Report Status 10/20/2014 FINAL  Final  MRSA PCR Screening     Status: None   Collection Time: 10/17/14  5:51 PM  Result Value Ref Range Status   MRSA by PCR NEGATIVE NEGATIVE Final    Comment:        The GeneXpert MRSA Assay (FDA approved for NASAL specimens only), is one component of a comprehensive MRSA colonization surveillance program. It is not intended to diagnose MRSA infection nor to guide or monitor treatment for MRSA infections.   Blood Culture (routine x 2)     Status: None   Collection Time: 11/09/14  2:34 PM  Result Value Ref Range Status  Specimen Description BLOOD RIGHT ARM  Final   Special Requests BOTTLES DRAWN AEROBIC AND ANAEROBIC 3CC  Final   Culture NO GROWTH 5 DAYS  Final   Report Status 11/14/2014 FINAL  Final  Urine culture     Status: None   Collection Time: 11/09/14  2:34 PM  Result Value Ref Range Status   Specimen Description URINE, CLEAN CATCH  Final   Special Requests NONE  Final   Culture   Final    >=100,000 COLONIES/mL ESCHERICHIA COLI ESBL-EXTENDED SPECTRUM BETA LACTAMASE-THE ORGANISM IS RESISTANT TO PENICILLINS, CEPHALOSPORINS AND AZTREONAM ACCORDING TO CLSI M100-S15 VOL.25 N01 JAN 2005. CRITICAL RESULT CALLED TO, READ BACK BY AND VERIFIED WITH: DANIEL JACOBS,RN 11/11/2014 0909 JRS.    Report Status 11/11/2014 FINAL  Final   Organism ID, Bacteria ESCHERICHIA COLI  Final      Susceptibility   Escherichia coli - MIC*    AMPICILLIN >=32 RESISTANT Resistant     CEFAZOLIN >=64 RESISTANT Resistant     CEFTRIAXONE >=64 RESISTANT Resistant     CIPROFLOXACIN >=4 RESISTANT  Resistant     GENTAMICIN <=1 SENSITIVE Sensitive     IMIPENEM <=0.25 SENSITIVE Sensitive     NITROFURANTOIN <=16 SENSITIVE Sensitive     TRIMETH/SULFA >=320 RESISTANT Resistant     Extended ESBL POSITIVE Resistant     PIP/TAZO Value in next row Sensitive      SENSITIVE8    LEVOFLOXACIN Value in next row Resistant      RESISTANT>=8    * >=100,000 COLONIES/mL ESCHERICHIA COLI  MRSA PCR Screening     Status: None   Collection Time: 11/09/14  6:24 PM  Result Value Ref Range Status   MRSA by PCR NEGATIVE NEGATIVE Final    Comment:        The GeneXpert MRSA Assay (FDA approved for NASAL specimens only), is one component of a comprehensive MRSA colonization surveillance program. It is not intended to diagnose MRSA infection nor to guide or monitor treatment for MRSA infections.     Coagulation Studies: No results for input(s): LABPROT, INR in the last 72 hours.  Urinalysis:  Recent Labs  11/15/14 2158  COLORURINE YELLOW*  LABSPEC 1.011  PHURINE 5.0  GLUCOSEU NEGATIVE  HGBUR 3+*  BILIRUBINUR NEGATIVE  KETONESUR NEGATIVE  PROTEINUR NEGATIVE  NITRITE NEGATIVE  LEUKOCYTESUR 3+*      Imaging: Dg Chest Port 1 View  11/15/2014   CLINICAL DATA:  Cough and congestion, onset this evening.  EXAM: PORTABLE CHEST 1 VIEW  COMPARISON:  Chest radiograph 11/12/2014  FINDINGS: Tip of the right upper extremity PICC in the distal SVC. Patient is post median sternotomy. There is stable cardiomegaly. Diminished bilateral airspace disease from prior exam, likely improved edema. Mild right infrahilar and left suprahilar atelectasis. Limited left basilar assessment scenario soft tissue attenuation. Chronic deformity about the left proximal humerus, unchanged.  IMPRESSION: 1. Findings suggest resolving pulmonary edema.  Stable cardiomegaly. 2. Tip of the right upper extremity PICC in the distal SVC.   Electronically Signed   By: Rubye Oaks M.D.   On: 11/15/2014 21:58     Medications:    . furosemide (LASIX) infusion 4 mg/hr (11/14/14 1213)   . acidophilus  1 capsule Oral BID  . amiodarone  200 mg Oral Daily  . apixaban  2.5 mg Oral BID  . atorvastatin  40 mg Oral q1800  . docusate sodium  100 mg Oral BID  . ertapenem  500 mg Intravenous Q24H  . gabapentin  400  mg Oral BID  . insulin aspart  0-5 Units Subcutaneous QHS  . insulin aspart  0-9 Units Subcutaneous TID WC  . insulin glargine  22 Units Subcutaneous QHS  . levothyroxine  100 mcg Oral QAC breakfast  . lubiprostone  24 mcg Oral BID WC  . nystatin cream   Topical BID  . sodium chloride  3 mL Intravenous Q12H   acetaminophen **OR** acetaminophen, albuterol, alum & mag hydroxide-simeth, HYDROcodone-acetaminophen, nitroGLYCERIN, ondansetron **OR** ondansetron (ZOFRAN) IV, polyethylene glycol, sodium chloride  Assessment/ Plan:  81 y.o. female with a PMHX of chronic systolic congestive heart failure, type 2 diabetes, hypertension, atrial fibrillation, DJD, COPD, coronary disease/CABG 1988, catheter 2015, recurrent UTIs, chronic kidney disease, who was admitted to [facility] on 11/09/2014 for evaluation of elevated creatinine above her baseline and increased swelling.   1. Acute renal failure on chronic kidney disease stage 4  . Patient's baseline creatinine is 1.8/GFR of 25. CKD is likely secondary to atherosclerosis. Acute worsening in creatinine is likely secondary to recent UTI from ESBL possibly leading to volume issues and ATN. Serum creatinine has improved close to baseline at 1.66/GFR 28 2. Anasarca. Likely from systolic congestive heart failure. Continue on IV Lasix infusion. She will need a Foley catheter for the duration of diuresis. Monitor electrolytes. Echo shows severely reduced LVEF 20-25%, diffuse hypokinesis, moderate TR, moderately elevated pulmonary artery pressure at 46 mm Hg   LOS: 7 Mahad Newstrom 9/29/201612:13 PM

## 2014-11-16 NOTE — Consult Note (Signed)
WOC wound consult note Reason for Consult: Bilateral intact boggy heels.  Blistering to lower extremities from edema, CHF.  Resolving.  Full thickness Moisture Associated Skin Damage to sacrum.   Wound type: See above. Pressure Ulcer POA: Yes redness and denuded skin to sacrum.  Blisters were intact and have ruptured.  Measurement:Left anterior lower leg 6 cm x 4 cm intact serum filled blister Left medial malleolus, ruptured blister 1 cm x 1cm 100% slough to wound bed.  Right anterior lower leg 2 cm x 2 cm x 0.2 cm Bilateral boggy heels intact 1 cm x1 cm blanchable redness Sacrum 4.5 cm x 4 cm x 0.1 cm Wound bed: pink and moist Drainage (amount, consistency, odor) Minimal serosanguinous.   Edema is resolving to bilateral lower extremities. Periwound:intact Dressing procedure/placement/frequency:Cleanse blisters to bilateral lower legs with NS and pat dry.  Apply Allevyn silicone border foam dressing.  Change every 3 days.  Cleanse sacrum with soap and water.  Apply barrier cream twice daily.  Keep skin clean and dry.  No briefs. Prevalon boots to bilateral heels.   Will not follow at this time.  Please re-consult if needed.  Maple Hudson RN BSN CWON Pager [phone]

## 2014-11-17 LAB — BASIC METABOLIC PANEL
Anion gap: 7 (ref 5–15)
BUN: 56 mg/dL — AB (ref 6–20)
CALCIUM: 8.3 mg/dL — AB (ref 8.9–10.3)
CHLORIDE: 98 mmol/L — AB (ref 101–111)
CO2: 34 mmol/L — AB (ref 22–32)
CREATININE: 1.66 mg/dL — AB (ref 0.44–1.00)
GFR calc non Af Amer: 28 mL/min — ABNORMAL LOW (ref >60)
GFR, EST AFRICAN AMERICAN: 32 mL/min — AB (ref >60)
GLUCOSE: 36 mg/dL — AB (ref 65–99)
Potassium: 4.2 mmol/L (ref 3.5–5.1)
Sodium: 139 mmol/L (ref 135–145)

## 2014-11-17 LAB — GLUCOSE, CAPILLARY
GLUCOSE-CAPILLARY: 61 mg/dL — AB (ref 65–99)
GLUCOSE-CAPILLARY: 69 mg/dL (ref 65–99)
GLUCOSE-CAPILLARY: 60 mg/dL — AB (ref 65–99)
GLUCOSE-CAPILLARY: 88 mg/dL (ref 65–99)
Glucose-Capillary: 29 mg/dL — CL (ref 65–99)
Glucose-Capillary: 31 mg/dL — CL (ref 65–99)
Glucose-Capillary: 48 mg/dL — ABNORMAL LOW (ref 65–99)
Glucose-Capillary: 63 mg/dL — ABNORMAL LOW (ref 65–99)
Glucose-Capillary: 72 mg/dL (ref 65–99)
Glucose-Capillary: 226 mg/dL — ABNORMAL HIGH (ref 65–99)
Glucose-Capillary: 234 mg/dL — ABNORMAL HIGH (ref 65–99)
Glucose-Capillary: 243 mg/dL — ABNORMAL HIGH (ref 65–99)

## 2014-11-17 MED ORDER — CETYLPYRIDINIUM CHLORIDE 0.05 % MT LIQD
7.0000 mL | Freq: Two times a day (BID) | OROMUCOSAL | Status: DC
  Administered 2014-11-17 – 2014-12-04 (×24): 7 mL via OROMUCOSAL

## 2014-11-17 MED ORDER — PROMETHAZINE HCL 25 MG/ML IJ SOLN
12.5000 mg | Freq: Four times a day (QID) | INTRAMUSCULAR | Status: DC | PRN
  Administered 2014-11-17 – 2014-11-19 (×3): 12.5 mg via INTRAVENOUS
  Filled 2014-11-17 (×3): qty 1

## 2014-11-17 MED ORDER — PANTOPRAZOLE SODIUM 40 MG PO TBEC
40.0000 mg | DELAYED_RELEASE_TABLET | Freq: Every day | ORAL | Status: DC
  Administered 2014-11-17 – 2014-11-30 (×14): 40 mg via ORAL
  Filled 2014-11-17 (×14): qty 1

## 2014-11-17 NOTE — Plan of Care (Signed)
Problem: Discharge Progression Outcomes Goal: Other Discharge Outcomes/Goals Outcome: Progressing Pt hypoglycemic this am. Pt refused dinner last night. Lab called with critical value. Hypoglycemic protocol initiated. Will continue to monitor.

## 2014-11-17 NOTE — Care Management Important Message (Signed)
Important Message  Patient Details  Name: [name] MRN: [id number] Date of Birth: [birth year]   Medicare Important Message Given:  Other (see comment) (IM issued)    Gwenette Greet, RN 11/17/2014, 2:12 PM

## 2014-11-17 NOTE — Progress Notes (Signed)
ANTIBIOTIC CONSULT NOTE -follow up  Pharmacy Consult for Ertapenem Indication: UTI (E Coli) ESBL  Allergies  Allergen Reactions  . Contrast Media [Iodinated Diagnostic Agents] Shortness Of Breath  . Tramadol Nausea Only  . Valium [Diazepam] Other (See Comments)    Reaction:  Elevated heart rate  . Iodine Strong [Iodine] Rash  . Penicillins Rash    Patient Measurements: Height:  (154.9 cm) Weight: 189 lb 4.8 oz (85.866 kg) IBW/kg (Calculated) : 47.8  Vital Signs: Temp: 97.6 F (36.4 C) (09/30 0532) Temp Source: Oral (09/30 0532) BP: 121/64 mmHg (09/30 0532) Pulse Rate: 72 (09/30 0532) Labs:  Recent Labs  11/15/14 1013 11/15/14 2157 11/16/14 0527 11/17/14 0554  WBC 4.1  --  4.8  --   HGB 8.7*  --  8.7*  --   PLT 252  --  252  --   CREATININE  --  1.75* 1.66* 1.66*   Estimated Creatinine Clearance: 26.4 mL/min (by C-G formula based on Cr of 1.66).   Microbiology: Recent Results (from the past 720 hour(s))  Blood Culture (routine x 2)     Status: None   Collection Time: 11/09/14  2:34 PM  Result Value Ref Range Status   Specimen Description BLOOD RIGHT ARM  Final   Special Requests BOTTLES DRAWN AEROBIC AND ANAEROBIC 3CC  Final   Culture NO GROWTH 5 DAYS  Final   Report Status 11/14/2014 FINAL  Final  Urine culture     Status: None   Collection Time: 11/09/14  2:34 PM  Result Value Ref Range Status   Specimen Description URINE, CLEAN CATCH  Final   Special Requests NONE  Final   Culture   Final    >=100,000 COLONIES/mL ESCHERICHIA COLI ESBL-EXTENDED SPECTRUM BETA LACTAMASE-THE ORGANISM IS RESISTANT TO PENICILLINS, CEPHALOSPORINS AND AZTREONAM ACCORDING TO CLSI M100-S15 VOL.25 N01 JAN 2005. CRITICAL RESULT CALLED TO, READ BACK BY AND VERIFIED WITH: DANIEL JACOBS,RN 11/11/2014 0909 JRS.    Report Status 11/11/2014 FINAL  Final   Organism ID, Bacteria ESCHERICHIA COLI  Final      Susceptibility   Escherichia coli - MIC*    AMPICILLIN >=32 RESISTANT  Resistant     CEFAZOLIN >=64 RESISTANT Resistant     CEFTRIAXONE >=64 RESISTANT Resistant     CIPROFLOXACIN >=4 RESISTANT Resistant     GENTAMICIN <=1 SENSITIVE Sensitive     IMIPENEM <=0.25 SENSITIVE Sensitive     NITROFURANTOIN <=16 SENSITIVE Sensitive     TRIMETH/SULFA >=320 RESISTANT Resistant     Extended ESBL POSITIVE Resistant     PIP/TAZO Value in next row Sensitive      SENSITIVE8    LEVOFLOXACIN Value in next row Resistant      RESISTANT>=8    * >=100,000 COLONIES/mL ESCHERICHIA COLI  MRSA PCR Screening     Status: None   Collection Time: 11/09/14  6:24 PM  Result Value Ref Range Status   MRSA by PCR NEGATIVE NEGATIVE Final    Comment:        The GeneXpert MRSA Assay (FDA approved for NASAL specimens only), is one component of a comprehensive MRSA colonization surveillance program. It is not intended to diagnose MRSA infection nor to guide or monitor treatment for MRSA infections.     Assessment: Pharmacy consulted to dose ertapenem for ESBL UTI. Patient on day 7 of 10 day treatment course.  Plan:  Will continue ertapenem  IV Q24H, which is appropriate for renal function.  Pharmacy to follow per consult  [facility],  PharmD Clinical Pharmacist  11/17/2014 12:15 PM

## 2014-11-17 NOTE — Progress Notes (Signed)
[facility] - [place] at [facility]   PATIENT NAME: [name]    MR#:  [id number]  DATE OF BIRTH:  [birth year]  SUBJECTIVE:  Patient's blood sugar was low this morning. She had a rough night she reports. She continues to have lower extremity edema.  REVIEW OF SYSTEMS:    Review of Systems  Constitutional: Negative for fever, chills and malaise/fatigue.  HENT: Negative for sore throat.   Eyes: Negative for blurred vision.  Respiratory: Positive for shortness of breath. Negative for cough, hemoptysis and wheezing.   Cardiovascular: Positive for leg swelling. Negative for chest pain and palpitations.  Gastrointestinal: Negative for nausea, vomiting, abdominal pain, diarrhea and blood in stool.  Genitourinary: Negative for dysuria.  Musculoskeletal: Negative for back pain.  Neurological: Negative for dizziness, tremors and headaches.  Endo/Heme/Allergies: Does not bruise/bleed easily.    Tolerating Diet: Yes      DRUG ALLERGIES:   Allergies  Allergen Reactions  . Contrast Media [Iodinated Diagnostic Agents] Shortness Of Breath  . Tramadol Nausea Only  . Valium [Diazepam] Other (See Comments)    Reaction:  Elevated heart rate  . Iodine Strong [Iodine] Rash  . Penicillins Rash    VITALS:  Blood pressure 121/64, pulse 72, temperature 97.6 F (36.4 C), temperature source Oral, resp. rate 20, height  (1.549 m), weight 85.866 kg (189 lb 4.8 oz), SpO2 97 %.  PHYSICAL EXAMINATION:   Physical Exam  Constitutional: She is oriented to person, place, and time and well-developed, well-nourished, and in no distress. No distress.  HENT:  Head: Normocephalic.  Eyes: No scleral icterus.  Neck: Normal range of motion. Neck supple. No JVD present. No tracheal deviation present.  Cardiovascular: Normal rate, regular rhythm and normal heart sounds.  Exam reveals no gallop and no friction rub.   No murmur heard. Pulmonary/Chest: Effort normal and  breath sounds normal. No respiratory distress. She has no wheezes. She has no rales. She exhibits no tenderness.  Abdominal: Soft. Bowel sounds are normal. She exhibits no distension and no mass. There is no tenderness. There is no rebound and no guarding.  Musculoskeletal: Normal range of motion. She exhibits edema.  Neurological: She is alert and oriented to person, place, and time.  Skin: Skin is warm. No rash noted. No erythema.  Psychiatric: Affect and judgment normal.      LABORATORY PANEL:   CBC  Recent Labs Lab 11/16/14 0527  WBC 4.8  HGB 8.7*  HCT 28.7*  PLT 252   ------------------------------------------------------------------------------------------------------------------  Chemistries   Recent Labs Lab 11/12/14 0422  11/17/14 0554  NA 139  < > 139  K 5.1  < > 4.2  CL 103  < > 98*  CO2 31  < > 34*  GLUCOSE 62*  < > 36*  BUN 61*  < > 56*  CREATININE 1.87*  < > 1.66*  CALCIUM 8.1*  < > 8.3*  MG 2.7*  --   --   < > = values in this interval not displayed. ------------------------------------------------------------------------------------------------------------------  Cardiac Enzymes No results for input(s): TROPONINI in the last 168 hours. ------------------------------------------------------------------------------------------------------------------  RADIOLOGY:  Dg Chest Port 1 View  11/15/2014   CLINICAL DATA:  Cough and congestion, onset this evening.  EXAM: PORTABLE CHEST 1 VIEW  COMPARISON:  Chest radiograph 11/12/2014  FINDINGS: Tip of the right upper extremity PICC in the distal SVC. Patient is post median sternotomy. There is stable cardiomegaly. Diminished bilateral airspace disease from prior exam, likely improved edema. Mild  right infrahilar and left suprahilar atelectasis. Limited left basilar assessment scenario soft tissue attenuation. Chronic deformity about the left proximal humerus, unchanged.  IMPRESSION: 1. Findings suggest resolving  pulmonary edema.  Stable cardiomegaly. 2. Tip of the right upper extremity PICC in the distal SVC.   Electronically Signed   By: Rubye Oaks M.D.   On: 11/15/2014 21:58     ASSESSMENT AND PLAN:   81 year old female with a history of chronic systolic heart failure, DJD with functional quadriplegia, type 2 diabetes and hypertension who presented with elevation in her creatinine and edema.  1. Acute on chronic renal failure: Patient's creatinine has much improved and is at baseline. Patient is currently on Lasix drip. Nephrology is following. Renal ultrasound showed medical renal disease. Continue to avoid nephrotoxic agents. Her acute worsening in creatinine is likely secondary to recent UTI from ESBL leading to volume issues and ATN.  2. ESBL urinary tract infection: Patient is currently on Invanz which was started on September 24. She has PICC line and will need 10 days of antibiotics.  3. Acute on chronic systolic heart failure with anasarca: Patient's currently on Lasix drip. Her echocardiogram showed EF of 20% with hypokinesis of several areas. Patient will continue Lasix drip until we see more improvement in her anasarca.  4. CAD status post CABG: Patient is not on ACE inhibitor/ARB due to hypotension and acute renal failure. Blood pressure is low normal in the setting of using Lasix drip.  5. Atrial fibrillation: Patient's heart rate is controlled. Patient will continue on Eliquis and amiodarone.  6. Diabetes type 2: Continue Lantus and sliding scale insulin.      Management plans discussed with the patient and she is in agreement.  CODE STATUS: Full  TOTAL TIME TAKING CARE OF THIS PATIENT: 25 minutes.     POSSIBLE D/C 2-3 days, DEPENDING ON CLINICAL CONDITION.   MODY, SITAL M.D on 11/17/2014 at 11:33 AM  Between 7am to 6pm - Pager - [phone] After 6pm go to [email or web address] - password [email or web address] [facility]  [place] [facility]  Office  [phone]  CC: Primary  care physician; Fidel Levy, MD

## 2014-11-17 NOTE — Progress Notes (Signed)
PT Cancellation Note  Patient Details Name: [name] MRN: [id number] DOB: 1931/04/14   Cancelled Treatment:    Reason Eval/Treat Not Completed: Patient declined, no reason specified (Pt reports not feeling well and that if she moves at all she will get sick).  Will re-attempt PT treatment at a later date/time as able.   Irving Burton Hauschild 11/17/2014, 2:05 PM Hendricks Limes, PT [phone]

## 2014-11-17 NOTE — Progress Notes (Signed)
Inpatient Diabetes Program Recommendations  AACE/ADA: New Consensus Statement on Inpatient Glycemic Control (2015)  Target Ranges:  Prepandial:   less than 140 mg/dL      Peak postprandial:   less than 180 mg/dL (1-2 hours)      Critically ill patients:  140 - 180 mg/dL  Results for [name], [name] (MRN [id number]) as of 11/17/2014 11:55  Ref. Range 11/17/2014 06:42 11/17/2014 06:44 11/17/2014 06:50 11/17/2014 06:54 11/17/2014 07:02 11/17/2014 07:07 11/17/2014 07:10 11/17/2014 07:38 11/17/2014 08:13 11/17/2014 11:24  Glucose-Capillary Latest Ref Range: 65-99 mg/dL 31 (LL) 29 (LL) 48 (L) 63 (L) 61 (L) 69 72 60 (L) 88 226 (H)   Review of Glycemic Control  Diabetes history: DM2 Outpatient Diabetes medications: Lantus 32 units QHS, Humalog 4 units TID with meals Current orders for Inpatient glycemic control: Lantus 22 units QHS, Novolog 0-9 units TID with meals, Novolog 0-5 units HS  Inpatient Diabetes Program Recommendations: Insulin - Basal: Noted nurse note that patient refused supper last night. Fasting lab glucose 36 mg/dl this morning at 4:09 am and finger stick 29 mg/dl at 8:11. Please consider decreasing Lantus to 15 units QHS.  Thanks, Orlando Penner, RN, MSN, CCRN, CDE Diabetes Coordinator Inpatient Diabetes Program [phone] (Team Pager from 8am to 5pm) [phone] ([facility] office) [phone] [facility] office) [phone] [facility] office)

## 2014-11-17 NOTE — Progress Notes (Addendum)
Notified Dr Mody-Patient reports continued nausea and complaints of burping. Dr Juliene Pina ordered protonix 40 mg orally daily, 25 mg phenergan IV every 6 hrs PRN (12.5 mg phenergan ordered per policy)

## 2014-11-17 NOTE — Plan of Care (Signed)
Problem: Discharge Progression Outcomes Goal: Other Discharge Outcomes/Goals Outcome: Progressing Continues on contact isolation. VSS. Patient receiving lasix drip. Foley in place and draining. No complaints of pain. Nausea meds given with good relief.

## 2014-11-17 NOTE — Progress Notes (Signed)
Subjective:  Patient reports that her blood sugar dropped overnight. She had restless night with some shortness of breath Urine output recorded at 1000 cc   Objective:  Vital signs in last 24 hours:  Temp:  [97.6 F (36.4 C)-98.4 F (36.9 C)] 97.6 F (36.4 C) (09/30 0532) Pulse Rate:  [72-80] 72 (09/30 0532) Resp:  [18-20] 20 (09/30 0532) BP: (104-130)/(58-64) 121/64 mmHg (09/30 0532) SpO2:  [95 %-99 %] 97 % (09/30 0532) Weight:  [85.866 kg (189 lb 4.8 oz)] 85.866 kg (189 lb 4.8 oz) (09/30 0532)  Weight change: 5.534 kg (12 lb 3.2 oz) Filed Weights   11/15/14 0511 11/16/14 0630 11/17/14 0532  Weight: 81.012 kg (178 lb 9.6 oz) 80.332 kg (177 lb 1.6 oz) 85.866 kg (189 lb 4.8 oz)    Intake/Output:    Intake/Output Summary (Last 24 hours) at 11/17/14 1152 Last data filed at 11/17/14 0816  Gross per 24 hour  Intake 320.13 ml  Output   1000 ml  Net -679.87 ml     Physical Exam: General:  elderly, frail, laying in the bed   HEENT  moist mucous membranes, anicteric sclera   Neck  supple, no masses   Pulm/lungs  mild basilar crackles, normal effort   CVS/Heart  irregular rhythm   Abdomen:   soft, nontender, nondistended   Extremities:  2-3+ dependent edema   Neurologic:  alert, oriented, speech normal   Skin:  no acute rashes, skin breakdown over shins   Access:     Foley in place     Basic Metabolic Panel:   Recent Labs Lab 11/12/14 0422 11/13/14 0507 11/15/14 0443 11/15/14 2157 11/16/14 0527 11/17/14 0554  NA 139 138 136 137 138 139  K 5.1 5.5* 5.7* 5.1 4.7 4.2  CL 103 101 95* 98* 99* 98*  CO2 31 33* 32 34* 34* 34*  GLUCOSE 62* 105* 126* 128* 97 36*  BUN 61* 55* 60* 63* 58* 56*  CREATININE 1.87* 1.74* 1.79* 1.75* 1.66* 1.66*  CALCIUM 8.1* 8.4* 8.4* 8.1* 8.1* 8.3*  MG 2.7*  --   --   --   --   --      CBC:  Recent Labs Lab 11/15/14 1013 11/16/14 0527  WBC 4.1 4.8  HGB 8.7* 8.7*  HCT 29.1* 28.7*  MCV 80.0 79.5*  PLT 252 252       Microbiology:  Recent Results (from the past 720 hour(s))  Blood Culture (routine x 2)     Status: None   Collection Time: 11/09/14  2:34 PM  Result Value Ref Range Status   Specimen Description BLOOD RIGHT ARM  Final   Special Requests BOTTLES DRAWN AEROBIC AND ANAEROBIC 3CC  Final   Culture NO GROWTH 5 DAYS  Final   Report Status 11/14/2014 FINAL  Final  Urine culture     Status: None   Collection Time: 11/09/14  2:34 PM  Result Value Ref Range Status   Specimen Description URINE, CLEAN CATCH  Final   Special Requests NONE  Final   Culture   Final    >=100,000 COLONIES/mL ESCHERICHIA COLI ESBL-EXTENDED SPECTRUM BETA LACTAMASE-THE ORGANISM IS RESISTANT TO PENICILLINS, CEPHALOSPORINS AND AZTREONAM ACCORDING TO CLSI M100-S15 VOL.25 N01 JAN 2005. CRITICAL RESULT CALLED TO, READ BACK BY AND VERIFIED WITH: DANIEL JACOBS,RN 11/11/2014 0909 JRS.    Report Status 11/11/2014 FINAL  Final   Organism ID, Bacteria ESCHERICHIA COLI  Final      Susceptibility   Escherichia coli - MIC*  AMPICILLIN >=32 RESISTANT Resistant     CEFAZOLIN >=64 RESISTANT Resistant     CEFTRIAXONE >=64 RESISTANT Resistant     CIPROFLOXACIN >=4 RESISTANT Resistant     GENTAMICIN <=1 SENSITIVE Sensitive     IMIPENEM <=0.25 SENSITIVE Sensitive     NITROFURANTOIN <=16 SENSITIVE Sensitive     TRIMETH/SULFA >=320 RESISTANT Resistant     Extended ESBL POSITIVE Resistant     PIP/TAZO Value in next row Sensitive      SENSITIVE8    LEVOFLOXACIN Value in next row Resistant      RESISTANT>=8    * >=100,000 COLONIES/mL ESCHERICHIA COLI  MRSA PCR Screening     Status: None   Collection Time: 11/09/14  6:24 PM  Result Value Ref Range Status   MRSA by PCR NEGATIVE NEGATIVE Final    Comment:        The GeneXpert MRSA Assay (FDA approved for NASAL specimens only), is one component of a comprehensive MRSA colonization surveillance program. It is not intended to diagnose MRSA infection nor to guide or monitor  treatment for MRSA infections.     Coagulation Studies: No results for input(s): LABPROT, INR in the last 72 hours.  Urinalysis:  Recent Labs  11/15/14 2158  COLORURINE YELLOW*  LABSPEC 1.011  PHURINE 5.0  GLUCOSEU NEGATIVE  HGBUR 3+*  BILIRUBINUR NEGATIVE  KETONESUR NEGATIVE  PROTEINUR NEGATIVE  NITRITE NEGATIVE  LEUKOCYTESUR 3+*      Imaging: Dg Chest Port 1 View  11/15/2014   CLINICAL DATA:  Cough and congestion, onset this evening.  EXAM: PORTABLE CHEST 1 VIEW  COMPARISON:  Chest radiograph 11/12/2014  FINDINGS: Tip of the right upper extremity PICC in the distal SVC. Patient is post median sternotomy. There is stable cardiomegaly. Diminished bilateral airspace disease from prior exam, likely improved edema. Mild right infrahilar and left suprahilar atelectasis. Limited left basilar assessment scenario soft tissue attenuation. Chronic deformity about the left proximal humerus, unchanged.  IMPRESSION: 1. Findings suggest resolving pulmonary edema.  Stable cardiomegaly. 2. Tip of the right upper extremity PICC in the distal SVC.   Electronically Signed   By: Rubye Oaks M.D.   On: 11/15/2014 21:58     Medications:   . furosemide (LASIX) infusion 4 mg/hr (11/16/14 2156)   . acidophilus  1 capsule Oral BID  . amiodarone  200 mg Oral Daily  . antiseptic oral rinse  7 mL Mouth Rinse BID  . apixaban  2.5 mg Oral BID  . atorvastatin  40 mg Oral q1800  . docusate sodium  100 mg Oral BID  . ertapenem  500 mg Intravenous Q24H  . gabapentin  400 mg Oral BID  . insulin aspart  0-5 Units Subcutaneous QHS  . insulin aspart  0-9 Units Subcutaneous TID WC  . insulin glargine  22 Units Subcutaneous QHS  . levothyroxine  100 mcg Oral QAC breakfast  . lubiprostone  24 mcg Oral BID WC  . nystatin cream   Topical BID  . sodium chloride  3 mL Intravenous Q12H   acetaminophen **OR** acetaminophen, albuterol, alum & mag hydroxide-simeth, HYDROcodone-acetaminophen, nitroGLYCERIN,  ondansetron **OR** ondansetron (ZOFRAN) IV, polyethylene glycol, sodium chloride  Assessment/ Plan:  81 y.o. female with a PMHX of chronic systolic congestive heart failure, type 2 diabetes, hypertension, atrial fibrillation, DJD, COPD, coronary disease/CABG 1988, catheter 2015, recurrent UTIs, chronic kidney disease, who was admitted to [facility] on 11/09/2014 for evaluation of elevated creatinine above her baseline and increased swelling.   1. Acute renal failure  on chronic kidney disease stage 4  . Patient's baseline creatinine is 1.66/GFR of 28. CKD is likely secondary to atherosclerosis. Acute worsening in creatinine is likely secondary to recent UTI from ESBL possibly leading to volume issues and ATN.   2. Anasarca. Likely from systolic congestive heart failure. Continue on IV Lasix infusion. She will need a Foley catheter for the duration of diuresis. Monitor electrolytes. Echo shows severely reduced LVEF 20-25%, diffuse hypokinesis, moderate TR, moderately elevated pulmonary artery pressure at 46 mm Hg Increase the rate of IV Lasix drip to 6 milligram per hour  LOS: 8 SINGH,HARMEET 9/30/201611:52 AM

## 2014-11-17 NOTE — Plan of Care (Signed)
Problem: Discharge Progression Outcomes Goal: Other Discharge Outcomes/Goals Outcome: Not Progressing Plan of care progress to goal 1: Barriers to progression-Pt on Lasix drip. Lasix drip possibly will be discontinued today.  2. Pain: Norco one tab given for left hip pain. Pain med effective. 3. Hemodynamics: Vital signs stable. Indwelling foley cath in place for aggressive diuresis.  4. Complications: Pt with elevated BUN/Cr. Dr Thedore Mins consulted. 5. Activity appropriate: Not progressing. Pt mainly bed bound. Pt was not as active prior to admission. This may very well be her baseline.

## 2014-11-17 NOTE — Clinical Documentation Improvement (Signed)
Internal Medicine  WOC RN consult note states Sacral pressure ulcer, present on admission.  Please document in your future progress notes and discharge summary if you agree with the assessment of pressure ulcer to sacrum and present on admission, along with stage.    Possible Clinical Conditions: -Pressure Ulcer to Sacrum, Present on Admission (please also document stage) -Pressure Ulcer to Sacrum, not present on admission (please also document stage) -Other condition -Unable to determine at present   Please exercise your independent, professional judgment when responding. A specific answer is not anticipated or expected.  Thank you, Doy Mince, RN [phone] Clinical Documentation Specialist

## 2014-11-18 LAB — BASIC METABOLIC PANEL
ANION GAP: 6 (ref 5–15)
BUN: 57 mg/dL — ABNORMAL HIGH (ref 6–20)
CHLORIDE: 97 mmol/L — AB (ref 101–111)
CO2: 35 mmol/L — AB (ref 22–32)
Calcium: 8.1 mg/dL — ABNORMAL LOW (ref 8.9–10.3)
Creatinine, Ser: 1.59 mg/dL — ABNORMAL HIGH (ref 0.44–1.00)
GFR calc non Af Amer: 29 mL/min — ABNORMAL LOW (ref >60)
GFR, EST AFRICAN AMERICAN: 34 mL/min — AB (ref >60)
GLUCOSE: 135 mg/dL — AB (ref 65–99)
POTASSIUM: 4.9 mmol/L (ref 3.5–5.1)
Sodium: 138 mmol/L (ref 135–145)

## 2014-11-18 LAB — GLUCOSE, CAPILLARY
GLUCOSE-CAPILLARY: 110 mg/dL — AB (ref 65–99)
GLUCOSE-CAPILLARY: 103 mg/dL — AB (ref 65–99)
GLUCOSE-CAPILLARY: 94 mg/dL (ref 65–99)
Glucose-Capillary: 105 mg/dL — ABNORMAL HIGH (ref 65–99)

## 2014-11-18 MED ORDER — ALBUMIN HUMAN 25 % IV SOLN
12.5000 g | Freq: Two times a day (BID) | INTRAVENOUS | Status: DC
  Administered 2014-11-18 – 2014-11-23 (×11): 12.5 g via INTRAVENOUS
  Filled 2014-11-18 (×17): qty 50

## 2014-11-18 NOTE — Progress Notes (Signed)
[facility] - [place] at [facility]   PATIENT NAME: [name]    MR#:  [id number]  DATE OF BIRTH:  05-11-1931  SUBJECTIVE:  Had good sleep last night, and happy with the medicine she received last evening for " indigestion"  REVIEW OF SYSTEMS:    Review of Systems  Constitutional: Negative for fever, chills and malaise/fatigue.  HENT: Negative for sore throat.   Eyes: Negative for blurred vision.  Respiratory: Positive for shortness of breath. Negative for cough, hemoptysis and wheezing.   Cardiovascular: Positive for leg swelling. Negative for chest pain and palpitations.  Gastrointestinal: Negative for nausea, vomiting, abdominal pain, diarrhea and blood in stool.  Genitourinary: Negative for dysuria.  Musculoskeletal: Negative for back pain.  Neurological: Negative for dizziness, tremors and headaches.  Endo/Heme/Allergies: Does not bruise/bleed easily.    Tolerating Diet: Yes   DRUG ALLERGIES:   Allergies  Allergen Reactions  . Contrast Media [Iodinated Diagnostic Agents] Shortness Of Breath  . Tramadol Nausea Only  . Valium [Diazepam] Other (See Comments)    Reaction:  Elevated heart rate  . Iodine Strong [Iodine] Rash  . Penicillins Rash    VITALS:  Blood pressure 113/57, pulse 75, temperature 98.5 F (36.9 C), temperature source Oral, resp. rate 16, height  (1.549 m), weight 82.237 kg (181 lb 4.8 oz), SpO2 95 %.  PHYSICAL EXAMINATION:   Physical Exam  Constitutional: She is oriented to person, place, and time and well-developed, well-nourished, and in no distress. No distress.  HENT:  Head: Normocephalic.  Eyes: No scleral icterus.  Neck: Normal range of motion. Neck supple. No JVD present. No tracheal deviation present.  Cardiovascular: Normal rate, regular rhythm and normal heart sounds.  Exam reveals no gallop and no friction rub.   No murmur heard. Pulmonary/Chest: Effort normal and breath sounds normal. No  respiratory distress. She has no wheezes. She has no rales. She exhibits no tenderness.  Abdominal: Soft. Bowel sounds are normal. She exhibits no distension and no mass. There is no tenderness. There is no rebound and no guarding.  Musculoskeletal: Normal range of motion. She exhibits edema.  Neurological: She is alert and oriented to person, place, and time.  Skin: Skin is warm. No rash noted. No erythema.  Breakdowns on both legs.  Psychiatric: Affect and judgment normal.    LABORATORY PANEL:   CBC  Recent Labs Lab 11/16/14 0527  WBC 4.8  HGB 8.7*  HCT 28.7*  PLT 252   ------------------------------------------------------------------------------------------------------------------  Chemistries   Recent Labs Lab 11/12/14 0422  11/18/14 0542  NA 139  < > 138  K 5.1  < > 4.9  CL 103  < > 97*  CO2 31  < > 35*  GLUCOSE 62*  < > 135*  BUN 61*  < > 57*  CREATININE 1.87*  < > 1.59*  CALCIUM 8.1*  < > 8.1*  MG 2.7*  --   --   < > = values in this interval not displayed. ------------------------------------------------------------------------------------------------------------------  Cardiac Enzymes No results for input(s): TROPONINI in the last 168 hours. ------------------------------------------------------------------------------------------------------------------  RADIOLOGY:  No results found.   ASSESSMENT AND PLAN:   81 year old female with a history of chronic systolic heart failure, DJD with functional quadriplegia, type 2 diabetes and hypertension who presented with elevation in her creatinine and edema.  1. Acute on chronic renal failure: Patient's creatinine has much improved and is at baseline. Patient is currently on Lasix drip. Nephrology is following. Renal ultrasound showed medical  renal disease. Continue to avoid nephrotoxic agents. Her acute worsening in creatinine is likely secondary to recent UTI from ESBL leading to volume issues and ATN.  Will  need 1-2 more days of better diuresis.  2. ESBL urinary tract infection:  on Invanz which was started on September 24. She has PICC line and will need 10 days of antibiotics.  3. Acute on chronic systolic heart failure with anasarca: Patient's currently on Lasix drip. Her echocardiogram showed EF of 20% with hypokinesis of several areas.  continue Lasix drip we expect some more improvement in her anasarca.  4. CAD status post CABG: Patient is not on ACE inhibitor/ARB due to hypotension and acute renal failure. Blood pressure is low normal in the setting of using Lasix drip.  5. Atrial fibrillation:  heart rate is controlled. Patient will continue on Eliquis and amiodarone.  6. Diabetes type 2: Continue Lantus and sliding scale insulin.   She lives at [facility].   Management plans discussed with the patient and she is in agreement.  CODE STATUS: Full  TOTAL TIME TAKING CARE OF THIS PATIENT: 25 minutes.     POSSIBLE D/C 2-3 days, DEPENDING ON CLINICAL CONDITION.   Altamese Dilling M.D on 11/18/2014 at 10:24 AM  Between 7am to 6pm - Pager - [phone] After 6pm go to [email or web address] - password [email or web address] [facility]  [facility]  Office  [phone]  CC: Primary care physician; Fidel Levy, MD

## 2014-11-18 NOTE — Progress Notes (Signed)
Subjective:  Patient reports that she feels fair today. No acute shortness of breath but breathing is not normal IV Lasix is continued but she also continues to have large amount of generalized edema Urine output recorded at 750 cc   Objective:  Vital signs in last 24 hours:  Temp:  [97.6 F (36.4 C)-98.5 F (36.9 C)] 98.5 F (36.9 C) (10/01 0459) Pulse Rate:  [75-80] 75 (10/01 0459) Resp:  [16] 16 (10/01 0459) BP: (113-127)/(57-64) 113/57 mmHg (10/01 0459) SpO2:  [95 %-100 %] 95 % (10/01 0459) Weight:  [82.237 kg (181 lb 4.8 oz)] 82.237 kg (181 lb 4.8 oz) (10/01 0500)  Weight change: -3.629 kg (-8 lb) Filed Weights   11/16/14 0630 11/17/14 0532 11/18/14 0500  Weight: 80.332 kg (177 lb 1.6 oz) 85.866 kg (189 lb 4.8 oz) 82.237 kg (181 lb 4.8 oz)    Intake/Output:    Intake/Output Summary (Last 24 hours) at 11/18/14 1112 Last data filed at 11/18/14 0903  Gross per 24 hour  Intake 149.13 ml  Output    750 ml  Net -600.87 ml     Physical Exam: General:  elderly, frail, laying in the bed   HEENT  moist mucous membranes, anicteric sclera   Neck  supple, no masses   Pulm/lungs  mild basilar crackles, normal effort   CVS/Heart  irregular rhythm   Abdomen:   soft, nontender, nondistended   Extremities:  2-3+ dependent edema   Neurologic:  alert, oriented, speech normal   Skin:  no acute rashes, skin breakdown over shins   Access:     Foley in place     Basic Metabolic Panel:   Recent Labs Lab 11/12/14 0422  11/15/14 0443 11/15/14 2157 11/16/14 0527 11/17/14 0554 11/18/14 0542  NA 139  < > 136 137 138 139 138  K 5.1  < > 5.7* 5.1 4.7 4.2 4.9  CL 103  < > 95* 98* 99* 98* 97*  CO2 31  < > 32 34* 34* 34* 35*  GLUCOSE 62*  < > 126* 128* 97 36* 135*  BUN 61*  < > 60* 63* 58* 56* 57*  CREATININE 1.87*  < > 1.79* 1.75* 1.66* 1.66* 1.59*  CALCIUM 8.1*  < > 8.4* 8.1* 8.1* 8.3* 8.1*  MG 2.7*  --   --   --   --   --   --   < > = values in this interval not  displayed.   CBC:  Recent Labs Lab 11/15/14 1013 11/16/14 0527  WBC 4.1 4.8  HGB 8.7* 8.7*  HCT 29.1* 28.7*  MCV 80.0 79.5*  PLT 252 252      Microbiology:  Recent Results (from the past 720 hour(s))  Blood Culture (routine x 2)     Status: None   Collection Time: 11/09/14  2:34 PM  Result Value Ref Range Status   Specimen Description BLOOD RIGHT ARM  Final   Special Requests BOTTLES DRAWN AEROBIC AND ANAEROBIC 3CC  Final   Culture NO GROWTH 5 DAYS  Final   Report Status 11/14/2014 FINAL  Final  Urine culture     Status: None   Collection Time: 11/09/14  2:34 PM  Result Value Ref Range Status   Specimen Description URINE, CLEAN CATCH  Final   Special Requests NONE  Final   Culture   Final    >=100,000 COLONIES/mL ESCHERICHIA COLI ESBL-EXTENDED SPECTRUM BETA LACTAMASE-THE ORGANISM IS RESISTANT TO PENICILLINS, CEPHALOSPORINS AND AZTREONAM ACCORDING TO CLSI M100-S15 VOL.25  N01 JAN 2005. CRITICAL RESULT CALLED TO, READ BACK BY AND VERIFIED WITH: DANIEL JACOBS,RN 11/11/2014 0909 JRS.    Report Status 11/11/2014 FINAL  Final   Organism ID, Bacteria ESCHERICHIA COLI  Final      Susceptibility   Escherichia coli - MIC*    AMPICILLIN >=32 RESISTANT Resistant     CEFAZOLIN >=64 RESISTANT Resistant     CEFTRIAXONE >=64 RESISTANT Resistant     CIPROFLOXACIN >=4 RESISTANT Resistant     GENTAMICIN <=1 SENSITIVE Sensitive     IMIPENEM <=0.25 SENSITIVE Sensitive     NITROFURANTOIN <=16 SENSITIVE Sensitive     TRIMETH/SULFA >=320 RESISTANT Resistant     Extended ESBL POSITIVE Resistant     PIP/TAZO Value in next row Sensitive      SENSITIVE8    LEVOFLOXACIN Value in next row Resistant      RESISTANT>=8    * >=100,000 COLONIES/mL ESCHERICHIA COLI  MRSA PCR Screening     Status: None   Collection Time: 11/09/14  6:24 PM  Result Value Ref Range Status   MRSA by PCR NEGATIVE NEGATIVE Final    Comment:        The GeneXpert MRSA Assay (FDA approved for NASAL specimens only),  is one component of a comprehensive MRSA colonization surveillance program. It is not intended to diagnose MRSA infection nor to guide or monitor treatment for MRSA infections.     Coagulation Studies: No results for input(s): LABPROT, INR in the last 72 hours.  Urinalysis:  Recent Labs  11/15/14 2158  COLORURINE YELLOW*  LABSPEC 1.011  PHURINE 5.0  GLUCOSEU NEGATIVE  HGBUR 3+*  BILIRUBINUR NEGATIVE  KETONESUR NEGATIVE  PROTEINUR NEGATIVE  NITRITE NEGATIVE  LEUKOCYTESUR 3+*      Imaging: No results found.   Medications:   . furosemide (LASIX) infusion 4 mg/hr (11/18/14 1000)   . acidophilus  1 capsule Oral BID  . amiodarone  200 mg Oral Daily  . antiseptic oral rinse  7 mL Mouth Rinse BID  . apixaban  2.5 mg Oral BID  . atorvastatin  40 mg Oral q1800  . docusate sodium  100 mg Oral BID  . ertapenem  500 mg Intravenous Q24H  . gabapentin  400 mg Oral BID  . insulin aspart  0-5 Units Subcutaneous QHS  . insulin aspart  0-9 Units Subcutaneous TID WC  . insulin glargine  22 Units Subcutaneous QHS  . levothyroxine  100 mcg Oral QAC breakfast  . lubiprostone  24 mcg Oral BID WC  . nystatin cream   Topical BID  . pantoprazole  40 mg Oral Daily  . sodium chloride  3 mL Intravenous Q12H   acetaminophen **OR** acetaminophen, albuterol, alum & mag hydroxide-simeth, HYDROcodone-acetaminophen, nitroGLYCERIN, ondansetron **OR** ondansetron (ZOFRAN) IV, polyethylene glycol, promethazine, sodium chloride  Assessment/ Plan:  81 y.o. female with a PMHX of chronic systolic congestive heart failure, type 2 diabetes, hypertension, atrial fibrillation, DJD, COPD, coronary disease/CABG 1988, catheter 2015, recurrent UTIs, chronic kidney disease, who was admitted to [facility] on 11/09/2014 for evaluation of elevated creatinine above her baseline and increased swelling.   1. Acute renal failure on chronic kidney disease stage 4  . Patient's baseline creatinine is 1.66/GFR of 28. CKD is  likely secondary to atherosclerosis. Acute worsening in creatinine is likely secondary to recent UTI from ESBL possibly leading to volume issues and ATN.   2. Anasarca. Likely from systolic congestive heart failure. Continue on IV Lasix infusion. She will need a Foley catheter for the  duration of diuresis. Monitor electrolytes. Echo shows severely reduced LVEF 20-25%, diffuse hypokinesis, moderate TR, moderately elevated pulmonary artery pressure at 46 mm Hg Continue IV Lasix drip @ 6 milligram per hour Add low-dose IV albumin twice a day   LOS: 9 [name] 10/1/201611:12 AM

## 2014-11-18 NOTE — Plan of Care (Signed)
Problem: Discharge Progression Outcomes Goal: Other Discharge Outcomes/Goals Plan of care progress to goal: No complaints of pain or nausea this shift Lasix drip continues at 75ml/hr Diet: no intake this shift

## 2014-11-19 LAB — BASIC METABOLIC PANEL
ANION GAP: 8 (ref 5–15)
BUN: 60 mg/dL — ABNORMAL HIGH (ref 6–20)
CALCIUM: 8 mg/dL — AB (ref 8.9–10.3)
CO2: 34 mmol/L — AB (ref 22–32)
CREATININE: 1.76 mg/dL — AB (ref 0.44–1.00)
Chloride: 95 mmol/L — ABNORMAL LOW (ref 101–111)
GFR, EST AFRICAN AMERICAN: 30 mL/min — AB (ref >60)
GFR, EST NON AFRICAN AMERICAN: 26 mL/min — AB (ref >60)
GLUCOSE: 107 mg/dL — AB (ref 65–99)
Potassium: 5.3 mmol/L — ABNORMAL HIGH (ref 3.5–5.1)
Sodium: 137 mmol/L (ref 135–145)

## 2014-11-19 LAB — CBC
HCT: 28.8 % — ABNORMAL LOW (ref 35.0–47.0)
Hemoglobin: 9.3 g/dL — ABNORMAL LOW (ref 12.0–16.0)
MCH: 25.8 pg — ABNORMAL LOW (ref 26.0–34.0)
MCHC: 32.3 g/dL (ref 32.0–36.0)
MCV: 80 fL (ref 80.0–100.0)
PLATELETS: 279 10*3/uL (ref 150–440)
RBC: 3.6 MIL/uL — ABNORMAL LOW (ref 3.80–5.20)
RDW: 26.1 % — AB (ref 11.5–14.5)
WBC: 6.2 10*3/uL (ref 3.6–11.0)

## 2014-11-19 LAB — HEPATIC FUNCTION PANEL
ALBUMIN: 3.1 g/dL — AB (ref 3.5–5.0)
ALK PHOS: 80 U/L (ref 38–126)
ALT: 20 U/L (ref 14–54)
AST: 61 U/L — AB (ref 15–41)
BILIRUBIN TOTAL: 0.9 mg/dL (ref 0.3–1.2)
Bilirubin, Direct: 0.3 mg/dL (ref 0.1–0.5)
Indirect Bilirubin: 0.6 mg/dL (ref 0.3–0.9)
Total Protein: 5.7 g/dL — ABNORMAL LOW (ref 6.5–8.1)

## 2014-11-19 LAB — GLUCOSE, CAPILLARY
Glucose-Capillary: 99 mg/dL (ref 65–99)
Glucose-Capillary: 87 mg/dL (ref 65–99)
Glucose-Capillary: 117 mg/dL — ABNORMAL HIGH (ref 65–99)
Glucose-Capillary: 128 mg/dL — ABNORMAL HIGH (ref 65–99)

## 2014-11-19 MED ORDER — GABAPENTIN 300 MG PO CAPS
300.0000 mg | ORAL_CAPSULE | Freq: Every day | ORAL | Status: DC
  Administered 2014-11-20 – 2014-12-03 (×14): 300 mg via ORAL
  Filled 2014-11-19 (×14): qty 1

## 2014-11-19 NOTE — Plan of Care (Signed)
Problem: Discharge Progression Outcomes Goal: Other Discharge Outcomes/Goals Pt had pain x 1 with relief, lasix gtt remains intact per orders, edema continues at this time, albumin x 1 given, pt tolerated well.  Foley in place for aggressive diuresis at this time, draining well.  Pt rested at intervals throughout the day.

## 2014-11-19 NOTE — Progress Notes (Signed)
CSW following for dc coordination. Pt is from [facility] and plans to return at dc once stable. PT's HCPOA is [name] [phone]. CSW will continue to follow to assist as needed.   Wilford Grist, LCSW [phone]

## 2014-11-19 NOTE — Progress Notes (Signed)
[facility] - [place] at [facility]   PATIENT NAME: [name]    MR#:  [id number]  DATE OF BIRTH:  [birth year]  SUBJECTIVE:  Not feeling " Good" today. Still have generalized swelling.   REVIEW OF SYSTEMS:    Review of Systems  Constitutional: Negative for fever, chills and malaise/fatigue.  HENT: Negative for sore throat.   Eyes: Negative for blurred vision.  Respiratory: Positive for shortness of breath. Negative for cough, hemoptysis and wheezing.   Cardiovascular: Positive for leg swelling. Negative for chest pain and palpitations.  Gastrointestinal: Negative for nausea, vomiting, abdominal pain, diarrhea and blood in stool.  Genitourinary: Negative for dysuria.  Musculoskeletal: Negative for back pain.  Neurological: Negative for dizziness, tremors and headaches.  Endo/Heme/Allergies: Does not bruise/bleed easily.    Tolerating Diet: Yes   DRUG ALLERGIES:   Allergies  Allergen Reactions  . Contrast Media [Iodinated Diagnostic Agents] Shortness Of Breath  . Tramadol Nausea Only  . Valium [Diazepam] Other (See Comments)    Reaction:  Elevated heart rate  . Iodine Strong [Iodine] Rash  . Penicillins Rash    VITALS:  Blood pressure 137/72, pulse 83, temperature 98.3 F (36.8 C), temperature source Oral, resp. rate 20, height  (1.549 m), weight 82.129 kg (181 lb 1 oz), SpO2 94 %.  PHYSICAL EXAMINATION:   Physical Exam  Constitutional: She is oriented to person, place, and time and well-developed, well-nourished, and in no distress. No distress.  HENT:  Head: Normocephalic.  Eyes: No scleral icterus.  Neck: Normal range of motion. Neck supple. No JVD present. No tracheal deviation present.  Cardiovascular: Normal rate, regular rhythm and normal heart sounds.  Exam reveals no gallop and no friction rub.   No murmur heard. Pulmonary/Chest: Effort normal and breath sounds normal. No respiratory distress. She has no wheezes. She has  no rales. She exhibits no tenderness.  Abdominal: Soft. Bowel sounds are normal. She exhibits no distension and no mass. There is no tenderness. There is no rebound and no guarding.  Edema on abdominal wall and pelvic area present.  Musculoskeletal: Normal range of motion. She exhibits edema.  Neurological: She is alert and oriented to person, place, and time.  Skin: Skin is warm. No rash noted. No erythema.  Breakdowns on both legs.  Psychiatric: Affect and judgment normal.    LABORATORY PANEL:   CBC  Recent Labs Lab 11/19/14 0448  WBC 6.2  HGB 9.3*  HCT 28.8*  PLT 279   ------------------------------------------------------------------------------------------------------------------  Chemistries   Recent Labs Lab 11/19/14 0448  NA 137  K 5.3*  CL 95*  CO2 34*  GLUCOSE 107*  BUN 60*  CREATININE 1.76*  CALCIUM 8.0*  AST 61*  ALT 20  ALKPHOS 80  BILITOT 0.9   ------------------------------------------------------------------------------------------------------------------  Cardiac Enzymes No results for input(s): TROPONINI in the last 168 hours. ------------------------------------------------------------------------------------------------------------------  RADIOLOGY:  No results found.   ASSESSMENT AND PLAN:   81 year old female with a history of chronic systolic heart failure, DJD with functional quadriplegia, type 2 diabetes and hypertension who presented with elevation in her creatinine and edema.  1. Acute on chronic renal failure:  Patient's creatinine has much improved - slight worsening today.  currently on Lasix drip. Nephrology is following. Renal ultrasound showed medical renal disease. Continue to avoid nephrotoxic agents. decreased gabapentin dose. Her acute worsening in creatinine is likely secondary to recent UTI from ESBL leading to volume issues and ATN.  Will need 1-2 more days of better diuresis.  2. ESBL urinary tract infection:  on  Invanz which was started on September 24. She has PICC line and will need 10 days of antibiotics.  3. Acute on chronic systolic heart failure with anasarca: Patient's currently on Lasix drip. Her echocardiogram showed EF of 20% with hypokinesis of several areas.  continue Lasix drip we expect some more improvement in her anasarca.albumin is satisfactory.  4. CAD status post CABG: Patient is not on ACE inhibitor/ARB due to hypotension and acute renal failure. Blood pressure is low normal in the setting of using Lasix drip.  5. Atrial fibrillation:  heart rate is controlled. Patient will continue on Eliquis and amiodarone.  6. Diabetes type 2: Continue Lantus and sliding scale insulin.   She lives at [facility]. Still have significant edema.  Management plans discussed with the patient and she is in agreement.  CODE STATUS: Full  TOTAL TIME TAKING CARE OF THIS PATIENT: 25 minutes.   POSSIBLE D/C 2-3 days, DEPENDING ON CLINICAL CONDITION.   Altamese Dilling M.D on 11/19/2014 at 1:08 PM  Between 7am to 6pm - Pager - [phone] After 6pm go to [email or web address] - password [email or web address] [facility]  [facility]  Office  [phone]  CC: Primary care physician; Fidel Levy, MD

## 2014-11-19 NOTE — Plan of Care (Signed)
Problem: Discharge Progression Outcomes Goal: Other Discharge Outcomes/Goals Outcome: Progressing Plan of care progress to goal: 1. Discharge plan- pt is from [facility] and plan is to d/c back to facility when stable.  2. Barriers to progression-Lasix gtt continues at 46ml/hr.  3. Pain: No c/o pain noted this shift. 4. Hemodynamics: Vital signs stable. Indwelling foley cath in place for aggressive diuresis. Monitor AM labs ordered.   5. Complications: Dr. Anne Hahn notified for FSBS of 94 and 22 units of Lantus ordered, new order to hold 11/18/2014 2200 dose of Lantus. 6. Activity appropriate: Not progressing. Pt bed bound, per report this may be her baseline.

## 2014-11-19 NOTE — Progress Notes (Signed)
Subjective:  Patient reports that she does not feel good today. She has no appetite. Denied any breakfast. No acute shortness of breath  IV Lasix is continued but she also has large amount of generalized edema Urine output recorded at 700 cc   Objective:  Vital signs in last 24 hours:  Temp:  [97.5 F (36.4 C)-98.9 F (37.2 C)] 98.5 F (36.9 C) (10/02 0938) Pulse Rate:  [73-82] 82 (10/02 0938) Resp:  [16-21] 21 (10/02 0938) BP: (120-144)/(56-68) 144/68 mmHg (10/02 0938) SpO2:  [93 %-100 %] 97 % (10/02 0938) Weight:  [82.129 kg (181 lb 1 oz)] 82.129 kg (181 lb 1 oz) (10/02 0622)  Weight change: -0.108 kg (-3.8 oz) Filed Weights   11/17/14 0532 11/18/14 0500 11/19/14 0622  Weight: 85.866 kg (189 lb 4.8 oz) 82.237 kg (181 lb 4.8 oz) 82.129 kg (181 lb 1 oz)    Intake/Output:    Intake/Output Summary (Last 24 hours) at 11/19/14 1027 Last data filed at 11/19/14 0100  Gross per 24 hour  Intake  189.4 ml  Output    700 ml  Net -510.6 ml     Physical Exam: General:  elderly, frail, laying in the bed   HEENT  moist mucous membranes, anicteric sclera   Neck  supple, no masses   Pulm/lungs  mild basilar crackles, normal effort , [place] oxygen  CVS/Heart  irregular rhythm   Abdomen:   soft, nontender, nondistended   Extremities:  2-3+ dependent edema   Neurologic:  alert, oriented, speech normal   Skin:  no acute rashes, skin breakdown over shins   Access:     Foley in place     Basic Metabolic Panel:   Recent Labs Lab 11/15/14 2157 11/16/14 0527 11/17/14 0554 11/18/14 0542 11/19/14 0448  NA 137 138 139 138 137  K 5.1 4.7 4.2 4.9 5.3*  CL 98* 99* 98* 97* 95*  CO2 34* 34* 34* 35* 34*  GLUCOSE 128* 97 36* 135* 107*  BUN 63* 58* 56* 57* 60*  CREATININE 1.75* 1.66* 1.66* 1.59* 1.76*  CALCIUM 8.1* 8.1* 8.3* 8.1* 8.0*     CBC:  Recent Labs Lab 11/15/14 1013 11/16/14 0527 11/19/14 0448  WBC 4.1 4.8 6.2  HGB 8.7* 8.7* 9.3*  HCT 29.1* 28.7* 28.8*  MCV 80.0  79.5* 80.0  PLT 252 252 279      Microbiology:  Recent Results (from the past 720 hour(s))  Blood Culture (routine x 2)     Status: None   Collection Time: 11/09/14  2:34 PM  Result Value Ref Range Status   Specimen Description BLOOD RIGHT ARM  Final   Special Requests BOTTLES DRAWN AEROBIC AND ANAEROBIC 3CC  Final   Culture NO GROWTH 5 DAYS  Final   Report Status 11/14/2014 FINAL  Final  Urine culture     Status: None   Collection Time: 11/09/14  2:34 PM  Result Value Ref Range Status   Specimen Description URINE, CLEAN CATCH  Final   Special Requests NONE  Final   Culture   Final    >=100,000 COLONIES/mL ESCHERICHIA COLI ESBL-EXTENDED SPECTRUM BETA LACTAMASE-THE ORGANISM IS RESISTANT TO PENICILLINS, CEPHALOSPORINS AND AZTREONAM ACCORDING TO CLSI M100-S15 VOL.25 N01 JAN 2005. CRITICAL RESULT CALLED TO, READ BACK BY AND VERIFIED WITH: DANIEL JACOBS,RN 11/11/2014 0909 JRS.    Report Status 11/11/2014 FINAL  Final   Organism ID, Bacteria ESCHERICHIA COLI  Final      Susceptibility   Escherichia coli - MIC*  AMPICILLIN >=32 RESISTANT Resistant     CEFAZOLIN >=64 RESISTANT Resistant     CEFTRIAXONE >=64 RESISTANT Resistant     CIPROFLOXACIN >=4 RESISTANT Resistant     GENTAMICIN <=1 SENSITIVE Sensitive     IMIPENEM <=0.25 SENSITIVE Sensitive     NITROFURANTOIN <=16 SENSITIVE Sensitive     TRIMETH/SULFA >=320 RESISTANT Resistant     Extended ESBL POSITIVE Resistant     PIP/TAZO Value in next row Sensitive      SENSITIVE8    LEVOFLOXACIN Value in next row Resistant      RESISTANT>=8    * >=100,000 COLONIES/mL ESCHERICHIA COLI  MRSA PCR Screening     Status: None   Collection Time: 11/09/14  6:24 PM  Result Value Ref Range Status   MRSA by PCR NEGATIVE NEGATIVE Final    Comment:        The GeneXpert MRSA Assay (FDA approved for NASAL specimens only), is one component of a comprehensive MRSA colonization surveillance program. It is not intended to diagnose  MRSA infection nor to guide or monitor treatment for MRSA infections.     Coagulation Studies: No results for input(s): LABPROT, INR in the last 72 hours.  Urinalysis: No results for input(s): COLORURINE, LABSPEC, PHURINE, GLUCOSEU, HGBUR, BILIRUBINUR, KETONESUR, PROTEINUR, UROBILINOGEN, NITRITE, LEUKOCYTESUR in the last 72 hours.  Invalid input(s): APPERANCEUR    Imaging: No results found.   Medications:   . furosemide (LASIX) infusion 4 mg/hr (11/18/14 1000)   . acidophilus  1 capsule Oral BID  . albumin human  12.5 g Intravenous BID  . amiodarone  200 mg Oral Daily  . antiseptic oral rinse  7 mL Mouth Rinse BID  . apixaban  2.5 mg Oral BID  . atorvastatin  40 mg Oral q1800  . docusate sodium  100 mg Oral BID  . ertapenem  500 mg Intravenous Q24H  . gabapentin  400 mg Oral BID  . insulin aspart  0-5 Units Subcutaneous QHS  . insulin aspart  0-9 Units Subcutaneous TID WC  . insulin glargine  22 Units Subcutaneous QHS  . levothyroxine  100 mcg Oral QAC breakfast  . lubiprostone  24 mcg Oral BID WC  . nystatin cream   Topical BID  . pantoprazole  40 mg Oral Daily  . sodium chloride  3 mL Intravenous Q12H   acetaminophen **OR** acetaminophen, albuterol, alum & mag hydroxide-simeth, HYDROcodone-acetaminophen, nitroGLYCERIN, ondansetron **OR** ondansetron (ZOFRAN) IV, polyethylene glycol, promethazine, sodium chloride  Assessment/ Plan:  81 y.o. female with a PMHX of chronic systolic congestive heart failure, type 2 diabetes, hypertension, atrial fibrillation, DJD, COPD, coronary disease/CABG 1988, catheter 2015, recurrent UTIs, chronic kidney disease, who was admitted to [facility] on 11/09/2014 for evaluation of elevated creatinine above her baseline and increased swelling.   1. Acute renal failure on chronic kidney disease stage 4  . Patient's baseline creatinine is 1.66/GFR of 28. CKD is likely secondary to atherosclerosis. Acute worsening in creatinine is likely secondary to  recent UTI from ESBL possibly leading to volume issues and ATN.  Today's serum creatinine is noted to be slightly higher at 1.76, potassium is also trending higher at 5.3, which is concerning Will decrease the dose of gabapentin due to renal insufficiency  2. Anasarca. From systolic congestive heart failure. She will need a Foley catheter for the duration of diuresis. Monitor electrolytes. Echo shows severely reduced LVEF 20-25%, diffuse hypokinesis, moderate TR, moderately elevated pulmonary artery pressure at 46 mm Hg Continue IV Lasix drip @ 4 milligram  per hour Added low-dose IV albumin twice a day x 6 doses    LOS: 10 Carlyn Mullenbach 10/2/201610:27 AM

## 2014-11-20 LAB — BASIC METABOLIC PANEL
ANION GAP: 10 (ref 5–15)
BUN: 67 mg/dL — ABNORMAL HIGH (ref 6–20)
CHLORIDE: 90 mmol/L — AB (ref 101–111)
CO2: 31 mmol/L (ref 22–32)
Calcium: 7.9 mg/dL — ABNORMAL LOW (ref 8.9–10.3)
Creatinine, Ser: 2.02 mg/dL — ABNORMAL HIGH (ref 0.44–1.00)
GFR calc Af Amer: 25 mL/min — ABNORMAL LOW (ref >60)
GFR, EST NON AFRICAN AMERICAN: 22 mL/min — AB (ref >60)
GLUCOSE: 137 mg/dL — AB (ref 65–99)
POTASSIUM: 4.9 mmol/L (ref 3.5–5.1)
Sodium: 131 mmol/L — ABNORMAL LOW (ref 135–145)

## 2014-11-20 LAB — GLUCOSE, CAPILLARY
GLUCOSE-CAPILLARY: 82 mg/dL (ref 65–99)
GLUCOSE-CAPILLARY: 96 mg/dL (ref 65–99)
GLUCOSE-CAPILLARY: 106 mg/dL — AB (ref 65–99)
Glucose-Capillary: 153 mg/dL — ABNORMAL HIGH (ref 65–99)

## 2014-11-20 LAB — TSH: TSH: 3.463 u[IU]/mL (ref 0.350–4.500)

## 2014-11-20 MED ORDER — ENSURE ENLIVE PO LIQD
237.0000 mL | Freq: Two times a day (BID) | ORAL | Status: DC
  Administered 2014-11-20 – 2014-11-21 (×2): 237 mL via ORAL

## 2014-11-20 NOTE — Plan of Care (Signed)
Problem: Discharge Progression Outcomes Goal: Other Discharge Outcomes/Goals Outcome: Progressing Plan of care progress to goals- 1. Discharge plan- pt is from [facility] and would like to return to [place] at time of discharge.   2. Barriers to progression-Lasix gtt continues at 68ml/hr with indwelling foley catheter in place for aggressive diuresis.    3. Pain: c/o left hip pain improved with PRN pain medications. 4. Hemodynamics: Vital signs stable. Monitor AM labs ordered.    5. Complications: wound care to BL ankles and Left lower leg, pink foam allevyn dry and intact. Perineum/BL groin edematous and excoreatation, orders to clean, apply barrier cream and leave ota.  6. Activity appropriate: Not progressing. Pt bed bound, Pt needs assistance with turning per report this may be her baseline.

## 2014-11-20 NOTE — Plan of Care (Signed)
Problem: Discharge Progression Outcomes Goal: Other Discharge Outcomes/Goals Outcome: Progressing 1. Pain-no c/o pain this shift 2. Hemodynamically-             -VSS, pt remains afebrile this shift             -IV fluids continue per orders             -IV ABT continue per orders             -IV Lasix continue per orders 3. Complications-pt had coughed up yellow/white sputum 4. Diet-pt tolerating diet this shift 5. Activity-pt has been bed bound for the past 6 mos.

## 2014-11-20 NOTE — Care Management Important Message (Signed)
Important Message  Patient Details  Name: [name] MRN: [id number] Date of Birth: 12/04/1931   Medicare Important Message Given:   (IM given)    Gwenette Greet, RN 11/20/2014, 10:05 AM

## 2014-11-20 NOTE — Progress Notes (Signed)
Subjective:  Continues to complain of shortness of breath.  Continues on furosemide /hr   Objective:  Vital signs in last 24 hours:  Temp:  [97.3 F (36.3 C)-98.3 F (36.8 C)] 97.6 F (36.4 C) (10/03 0534) Pulse Rate:  [66-83] 66 (10/03 0534) Resp:  [18-20] 20 (10/03 0534) BP: (124-137)/(64-78) 128/67 mmHg (10/03 0534) SpO2:  [94 %-97 %] 97 % (10/03 0534) Weight:  [83.553 kg (184 lb 3.2 oz)] 83.553 kg (184 lb 3.2 oz) (10/03 0500)  Weight change: 1.423 kg (3 lb 2.2 oz) Filed Weights   11/18/14 0500 11/19/14 0622 11/20/14 0500  Weight: 82.237 kg (181 lb 4.8 oz) 82.129 kg (181 lb 1 oz) 83.553 kg (184 lb 3.2 oz)    Intake/Output:    Intake/Output Summary (Last 24 hours) at 11/20/14 1220 Last data filed at 11/20/14 0155  Gross per 24 hour  Intake     94 ml  Output    600 ml  Net   -506 ml     Physical Exam: General:  elderly, frail, laying in the bed   HEENT  moist mucous membranes, anicteric sclera   Neck  supple, no masses   Pulm/lungs  mild basilar crackles, normal effort , [place] oxygen 4 Litres  CVS/Heart  irregular rhythm   Abdomen:   soft, nontender, nondistended   Extremities:  2-3+ dependent edema   Neurologic:  alert, oriented, speech normal   Skin:  no acute rashes, skin breakdown over shins      GU  Foley in place     Basic Metabolic Panel:   Recent Labs Lab 11/15/14 2157 11/16/14 0527 11/17/14 0554 11/18/14 0542 11/19/14 0448  NA 137 138 139 138 137  K 5.1 4.7 4.2 4.9 5.3*  CL 98* 99* 98* 97* 95*  CO2 34* 34* 34* 35* 34*  GLUCOSE 128* 97 36* 135* 107*  BUN 63* 58* 56* 57* 60*  CREATININE 1.75* 1.66* 1.66* 1.59* 1.76*  CALCIUM 8.1* 8.1* 8.3* 8.1* 8.0*     CBC:  Recent Labs Lab 11/15/14 1013 11/16/14 0527 11/19/14 0448  WBC 4.1 4.8 6.2  HGB 8.7* 8.7* 9.3*  HCT 29.1* 28.7* 28.8*  MCV 80.0 79.5* 80.0  PLT 252 252 279      Microbiology:  Recent Results (from the past 720 hour(s))  Blood Culture (routine x 2)     Status: None    Collection Time: 11/09/14  2:34 PM  Result Value Ref Range Status   Specimen Description BLOOD RIGHT ARM  Final   Special Requests BOTTLES DRAWN AEROBIC AND ANAEROBIC 3CC  Final   Culture NO GROWTH 5 DAYS  Final   Report Status 11/14/2014 FINAL  Final  Urine culture     Status: None   Collection Time: 11/09/14  2:34 PM  Result Value Ref Range Status   Specimen Description URINE, CLEAN CATCH  Final   Special Requests NONE  Final   Culture   Final    >=100,000 COLONIES/mL ESCHERICHIA COLI ESBL-EXTENDED SPECTRUM BETA LACTAMASE-THE ORGANISM IS RESISTANT TO PENICILLINS, CEPHALOSPORINS AND AZTREONAM ACCORDING TO CLSI M100-S15 VOL.25 N01 JAN 2005. CRITICAL RESULT CALLED TO, READ BACK BY AND VERIFIED WITH: DANIEL JACOBS,RN 11/11/2014 0909 JRS.    Report Status 11/11/2014 FINAL  Final   Organism ID, Bacteria ESCHERICHIA COLI  Final      Susceptibility   Escherichia coli - MIC*    AMPICILLIN >=32 RESISTANT Resistant     CEFAZOLIN >=64 RESISTANT Resistant     CEFTRIAXONE >=64 RESISTANT Resistant  CIPROFLOXACIN >=4 RESISTANT Resistant     GENTAMICIN <=1 SENSITIVE Sensitive     IMIPENEM <=0.25 SENSITIVE Sensitive     NITROFURANTOIN <=16 SENSITIVE Sensitive     TRIMETH/SULFA >=320 RESISTANT Resistant     Extended ESBL POSITIVE Resistant     PIP/TAZO Value in next row Sensitive      SENSITIVE8    LEVOFLOXACIN Value in next row Resistant      RESISTANT>=8    * >=100,000 COLONIES/mL ESCHERICHIA COLI  MRSA PCR Screening     Status: None   Collection Time: 11/09/14  6:24 PM  Result Value Ref Range Status   MRSA by PCR NEGATIVE NEGATIVE Final    Comment:        The GeneXpert MRSA Assay (FDA approved for NASAL specimens only), is one component of a comprehensive MRSA colonization surveillance program. It is not intended to diagnose MRSA infection nor to guide or monitor treatment for MRSA infections.     Coagulation Studies: No results for input(s): LABPROT, INR in the last 72  hours.  Urinalysis: No results for input(s): COLORURINE, LABSPEC, PHURINE, GLUCOSEU, HGBUR, BILIRUBINUR, KETONESUR, PROTEINUR, UROBILINOGEN, NITRITE, LEUKOCYTESUR in the last 72 hours.  Invalid input(s): APPERANCEUR    Imaging: No results found.   Medications:   . furosemide (LASIX) infusion 4 mg/hr (11/19/14 1742)   . acidophilus  1 capsule Oral BID  . albumin human  12.5 g Intravenous BID  . amiodarone  200 mg Oral Daily  . antiseptic oral rinse  7 mL Mouth Rinse BID  . apixaban  2.5 mg Oral BID  . atorvastatin  40 mg Oral q1800  . docusate sodium  100 mg Oral BID  . ertapenem  500 mg Intravenous Q24H  . gabapentin  300 mg Oral QHS  . insulin aspart  0-5 Units Subcutaneous QHS  . insulin aspart  0-9 Units Subcutaneous TID WC  . insulin glargine  22 Units Subcutaneous QHS  . levothyroxine  100 mcg Oral QAC breakfast  . lubiprostone  24 mcg Oral BID WC  . nystatin cream   Topical BID  . pantoprazole  40 mg Oral Daily  . sodium chloride  3 mL Intravenous Q12H   acetaminophen **OR** acetaminophen, albuterol, alum & mag hydroxide-simeth, HYDROcodone-acetaminophen, nitroGLYCERIN, ondansetron **OR** ondansetron (ZOFRAN) IV, polyethylene glycol, promethazine, sodium chloride  Assessment/ Plan:  81 y.o. female with a PMHX of chronic systolic congestive heart failure, type 2 diabetes, hypertension, atrial fibrillation, DJD, COPD, coronary disease/CABG 1988, catheter 2015, recurrent UTIs, chronic kidney disease, who was admitted to [facility] on 11/09/2014 for evaluation of elevated creatinine above her baseline and increased swelling.   1. Acute renal failure on chronic kidney disease stage 4  . Patient's baseline creatinine is 1.66/GFR of 28. CKD is likely secondary to atherosclerosis and hypertension. Acute worsening in creatinine is likely secondary to recent UTI from ESBL possibly leading to volume issues and ATN.  No new labs today.   2. Hypertension and Anasarca. From systolic  congestive heart failure acute exacerbation. - Continue furosemide gtt  /hr - IV albumin - Low salt diet and fluid restriction.     LOS: 11 Oreta Soloway 10/3/201612:20 PM

## 2014-11-20 NOTE — Progress Notes (Signed)
[facility] - [place] at [facility]   PATIENT NAME: [name]    MR#:  [id number]  DATE OF BIRTH:  25-May-1931  SUBJECTIVE:  feeling " Good" today. Still have generalized swelling.   She is thankful and satisfied with overall improvement, said her swelling is much better than she came with.   REVIEW OF SYSTEMS:    Review of Systems  Constitutional: Negative for fever, chills and malaise/fatigue.  HENT: Negative for sore throat.   Eyes: Negative for blurred vision.  Respiratory: Negative for cough, hemoptysis, shortness of breath and wheezing.   Cardiovascular: Positive for leg swelling. Negative for chest pain and palpitations.  Gastrointestinal: Negative for nausea, vomiting, abdominal pain, diarrhea and blood in stool.  Genitourinary: Negative for dysuria.  Musculoskeletal: Negative for back pain.  Neurological: Negative for dizziness, tremors and headaches.  Endo/Heme/Allergies: Does not bruise/bleed easily.    Tolerating Diet: Yes   DRUG ALLERGIES:   Allergies  Allergen Reactions  . Contrast Media [Iodinated Diagnostic Agents] Shortness Of Breath  . Tramadol Nausea Only  . Valium [Diazepam] Other (See Comments)    Reaction:  Elevated heart rate  . Iodine Strong [Iodine] Rash  . Penicillins Rash    VITALS:  Blood pressure 128/67, pulse 66, temperature 97.6 F (36.4 C), temperature source Oral, resp. rate 20, height  (1.549 m), weight 83.553 kg (184 lb 3.2 oz), SpO2 97 %.  PHYSICAL EXAMINATION:   Physical Exam  Constitutional: She is oriented to person, place, and time and well-developed, well-nourished, and in no distress. No distress.  HENT:  Head: Normocephalic.  Eyes: No scleral icterus.  Neck: Normal range of motion. Neck supple. No JVD present. No tracheal deviation present.  Cardiovascular: Normal rate, regular rhythm and normal heart sounds.  Exam reveals no gallop and no friction rub.   No murmur  heard. Pulmonary/Chest: Effort normal and breath sounds normal. No respiratory distress. She has no wheezes. She has no rales. She exhibits no tenderness.  Abdominal: Soft. Bowel sounds are normal. She exhibits no distension and no mass. There is no tenderness. There is no rebound and no guarding.  Edema on abdominal wall and pelvic area present.  Musculoskeletal: Normal range of motion. She exhibits edema.  Neurological: She is alert and oriented to person, place, and time.  Skin: Skin is warm. No rash noted. No erythema.  Breakdowns on both legs.  Psychiatric: Affect and judgment normal.    LABORATORY PANEL:   CBC  Recent Labs Lab 11/19/14 0448  WBC 6.2  HGB 9.3*  HCT 28.8*  PLT 279   ------------------------------------------------------------------------------------------------------------------  Chemistries   Recent Labs Lab 11/19/14 0448  NA 137  K 5.3*  CL 95*  CO2 34*  GLUCOSE 107*  BUN 60*  CREATININE 1.76*  CALCIUM 8.0*  AST 61*  ALT 20  ALKPHOS 80  BILITOT 0.9   ------------------------------------------------------------------------------------------------------------------  Cardiac Enzymes No results for input(s): TROPONINI in the last 168 hours. ------------------------------------------------------------------------------------------------------------------  RADIOLOGY:  No results found.   ASSESSMENT AND PLAN:   81 year old female with a history of chronic systolic heart failure, DJD with functional quadriplegia, type 2 diabetes and hypertension who presented with elevation in her creatinine and edema.  1. Acute on chronic renal failure:  Patient's creatinine has much improved - slight worsening today.  currently on Lasix drip. Nephrology is following. Renal ultrasound showed medical renal disease. Continue to avoid nephrotoxic agents. decreased gabapentin dose. Her acute worsening in creatinine is likely secondary to recent UTI  from ESBL  leading to volume issues and ATN. She also have poor oral intake, i called dietary consult to help with supplements.  2. ESBL urinary tract infection:  on Invanz which was started on September 24. She has PICC line and will need 10 days of antibiotics. Up till 11/21/14  3. Acute on chronic systolic heart failure with anasarca: Patient's currently on Lasix drip. Her echocardiogram showed EF of 20% with hypokinesis of several areas.  continue Lasix drip we expect some more improvement in her anasarca.albumin is satisfactory.  4. CAD status post CABG: Patient is not on ACE inhibitor/ARB due to hypotension and acute renal failure. Blood pressure is low normal in the setting of using Lasix drip.  5. Atrial fibrillation:  heart rate is controlled. Patient will continue on Eliquis and amiodarone.  6. Diabetes type 2: Continue Lantus and sliding scale insulin.  7. Hypothyroidism   On Levothyroxin, check TSH.  She lives at [facility]. Still have significant edema.  Management plans discussed with the patient and she is in agreement.  CODE STATUS: Full  TOTAL TIME TAKING CARE OF THIS PATIENT: 25 minutes.  I called her grand daughter and spoke to her on phone.  POSSIBLE D/C 2-3 days, DEPENDING ON CLINICAL CONDITION.   Altamese Dilling M.D on 11/20/2014 at 1:24 PM  Between 7am to 6pm - Pager - [phone] After 6pm go to [email or web address] - password [email or web address] [facility]  [facility]  Office  [phone]  CC: Primary care physician; Fidel Levy, MD

## 2014-11-20 NOTE — Consult Note (Signed)
WOC wound consult note Reason for Consult: Bilateral intact boggy heels. Blistering to lower extremities from edema, CHF. Resolving. Full thickness Moisture Associated Skin Damage to sacrum.  Wound type: See above. Pressure Ulcer POA: Yes redness and denuded skin to sacrum. Blisters were intact and have ruptured.  Measurement:Left anterior lower leg 6 cm x 4 cm x0.1 cm ruptured blister.  Resolving.   Left medial malleolus, ruptured blister 1 cm x 1cm 100% slough to wound bed.  Right anterior lower leg 2 cm x 2 cm x 0.2 cm Bilateral boggy heels intact 1 cm x1 cm blanchable redness Refuses PRevalon boots.  Does have heels floated today with pillow under the calf.  Sacrum 4.5 cm x 4 cm x 0.1 cm Erythema improved.  Barrier cream each shift.  Wound bed: pink and moist Drainage (amount, consistency, odor) Minimal serosanguinous.  Edema resolved to bilateral lower extremities. Periwound:intact Dressing procedure/placement/frequency:Cleanse blisters to bilateral lower legs with NS and pat dry. Apply Allevyn silicone border foam dressing. Change every 3 days.  Cleanse sacrum with soap and water. Apply barrier cream twice daily. Keep skin clean and dry. No briefs. Prevalon boots to bilateral heels.  If refuses, float heels with pillow under the calf.  Will not follow at this time.  Please re-consult if needed.  Maple Hudson RN BSN CWON Pager [phone]

## 2014-11-21 DIAGNOSIS — I5043 Acute on chronic combined systolic (congestive) and diastolic (congestive) heart failure: Secondary | ICD-10-CM | POA: Diagnosis present

## 2014-11-21 DIAGNOSIS — I480 Paroxysmal atrial fibrillation: Secondary | ICD-10-CM

## 2014-11-21 DIAGNOSIS — I5023 Acute on chronic systolic (congestive) heart failure: Secondary | ICD-10-CM

## 2014-11-21 DIAGNOSIS — I2510 Atherosclerotic heart disease of native coronary artery without angina pectoris: Secondary | ICD-10-CM | POA: Insufficient documentation

## 2014-11-21 DIAGNOSIS — R600 Localized edema: Secondary | ICD-10-CM | POA: Diagnosis present

## 2014-11-21 DIAGNOSIS — N179 Acute kidney failure, unspecified: Secondary | ICD-10-CM | POA: Insufficient documentation

## 2014-11-21 DIAGNOSIS — I25810 Atherosclerosis of coronary artery bypass graft(s) without angina pectoris: Secondary | ICD-10-CM | POA: Diagnosis present

## 2014-11-21 LAB — GLUCOSE, CAPILLARY
GLUCOSE-CAPILLARY: 88 mg/dL (ref 65–99)
GLUCOSE-CAPILLARY: 118 mg/dL — AB (ref 65–99)
GLUCOSE-CAPILLARY: 144 mg/dL — AB (ref 65–99)
Glucose-Capillary: 136 mg/dL — ABNORMAL HIGH (ref 65–99)
Glucose-Capillary: 157 mg/dL — ABNORMAL HIGH (ref 65–99)

## 2014-11-21 LAB — BASIC METABOLIC PANEL
Anion gap: 6 (ref 5–15)
BUN: 68 mg/dL — AB (ref 6–20)
CALCIUM: 8.1 mg/dL — AB (ref 8.9–10.3)
CO2: 34 mmol/L — ABNORMAL HIGH (ref 22–32)
CREATININE: 2.09 mg/dL — AB (ref 0.44–1.00)
Chloride: 96 mmol/L — ABNORMAL LOW (ref 101–111)
GFR, EST AFRICAN AMERICAN: 24 mL/min — AB (ref >60)
GFR, EST NON AFRICAN AMERICAN: 21 mL/min — AB (ref >60)
Glucose, Bld: 101 mg/dL — ABNORMAL HIGH (ref 65–99)
Potassium: 4.9 mmol/L (ref 3.5–5.1)
SODIUM: 136 mmol/L (ref 135–145)

## 2014-11-21 LAB — MRSA PCR SCREENING: MRSA by PCR: NEGATIVE

## 2014-11-21 MED ORDER — ENSURE ENLIVE PO LIQD
237.0000 mL | Freq: Three times a day (TID) | ORAL | Status: DC
  Administered 2014-11-21 – 2014-11-22 (×4): 237 mL via ORAL

## 2014-11-21 MED ORDER — DOPAMINE-DEXTROSE 3.2-5 MG/ML-% IV SOLN
2.5000 ug/kg/min | INTRAVENOUS | Status: DC
  Administered 2014-11-21: 2.5 ug/kg/min via INTRAVENOUS
  Filled 2014-11-21: qty 250

## 2014-11-21 MED ORDER — DOBUTAMINE IN D5W 4-5 MG/ML-% IV SOLN
5.0000 ug/kg/min | INTRAVENOUS | Status: DC
  Administered 2014-11-21: 2.5 ug/kg/min via INTRAVENOUS
  Filled 2014-11-21: qty 250

## 2014-11-21 NOTE — Progress Notes (Signed)
ANTIBIOTIC CONSULT NOTE -follow up  Pharmacy Consult for Ertapenem Indication: UTI (E Coli) ESBL  Allergies  Allergen Reactions  . Contrast Media [Iodinated Diagnostic Agents] Shortness Of Breath  . Tramadol Nausea Only  . Valium [Diazepam] Other (See Comments)    Reaction:  Elevated heart rate  . Iodine Strong [Iodine] Rash  . Penicillins Rash    Patient Measurements: Height:  (154.9 cm) Weight: 187 lb 14.4 oz (85.231 kg) IBW/kg (Calculated) : 47.8  Vital Signs: Temp: 97.4 F (36.3 C) (10/04 0527) Temp Source: Oral (10/04 0527) BP: 121/62 mmHg (10/04 0527) Pulse Rate: 73 (10/04 0527) Labs:  Recent Labs  11/19/14 0448 11/20/14 1304 11/21/14 0541  WBC 6.2  --   --   HGB 9.3*  --   --   PLT 279  --   --   CREATININE 1.76* 2.02* 2.09*   Estimated Creatinine Clearance: 20.9 mL/min (by C-G formula based on Cr of 2.09).   Microbiology: Recent Results (from the past 720 hour(s))  Blood Culture (routine x 2)     Status: None   Collection Time: 11/09/14  2:34 PM  Result Value Ref Range Status   Specimen Description BLOOD RIGHT ARM  Final   Special Requests BOTTLES DRAWN AEROBIC AND ANAEROBIC 3CC  Final   Culture NO GROWTH 5 DAYS  Final   Report Status 11/14/2014 FINAL  Final  Urine culture     Status: None   Collection Time: 11/09/14  2:34 PM  Result Value Ref Range Status   Specimen Description URINE, CLEAN CATCH  Final   Special Requests NONE  Final   Culture   Final    >=100,000 COLONIES/mL ESCHERICHIA COLI ESBL-EXTENDED SPECTRUM BETA LACTAMASE-THE ORGANISM IS RESISTANT TO PENICILLINS, CEPHALOSPORINS AND AZTREONAM ACCORDING TO CLSI M100-S15 VOL.25 N01 JAN 2005. CRITICAL RESULT CALLED TO, READ BACK BY AND VERIFIED WITH: DANIEL JACOBS,RN 11/11/2014 0909 JRS.    Report Status 11/11/2014 FINAL  Final   Organism ID, Bacteria ESCHERICHIA COLI  Final      Susceptibility   Escherichia coli - MIC*    AMPICILLIN >=32 RESISTANT Resistant     CEFAZOLIN >=64  RESISTANT Resistant     CEFTRIAXONE >=64 RESISTANT Resistant     CIPROFLOXACIN >=4 RESISTANT Resistant     GENTAMICIN <=1 SENSITIVE Sensitive     IMIPENEM <=0.25 SENSITIVE Sensitive     NITROFURANTOIN <=16 SENSITIVE Sensitive     TRIMETH/SULFA >=320 RESISTANT Resistant     Extended ESBL POSITIVE Resistant     PIP/TAZO Value in next row Sensitive      SENSITIVE8    LEVOFLOXACIN Value in next row Resistant      RESISTANT>=8    * >=100,000 COLONIES/mL ESCHERICHIA COLI  MRSA PCR Screening     Status: None   Collection Time: 11/09/14  6:24 PM  Result Value Ref Range Status   MRSA by PCR NEGATIVE NEGATIVE Final    Comment:        The GeneXpert MRSA Assay (FDA approved for NASAL specimens only), is one component of a comprehensive MRSA colonization surveillance program. It is not intended to diagnose MRSA infection nor to guide or monitor treatment for MRSA infections.     Assessment: Pharmacy consulted to dose ertapenem for ESBL UTI.  Plan:  Will continue ertapenem  IV Q24H, which is appropriate for renal function.  Pharmacy to follow per consult  Bari Mantis PharmD Clinical Pharmacist 11/21/2014 8:24 AM

## 2014-11-21 NOTE — Progress Notes (Signed)
Pt was transferred to CCU for dopamine and dobutamine gtts.  Lasix gtt at /hr. She is alert and oriented. SR with BBB on monitor. HR 80's.  Lungs with fine crackles and diminished. Foley patent with good uop. BP stable. Chronic left hip pain helped by Norco and repositioning. Large amount of excoriation and edema to sacral and perineal area. This excoriation is yeast like.  Nystatin to bilateral groins and under breast.  Combination of nystatin and barrier cream applied to sacral excoriation.  Poor appetite.  Had all of ensure and jello.

## 2014-11-21 NOTE — Progress Notes (Signed)
Subjective:  Continues to complain of shortness of breath.  Continues on furosemide /hr. UOP 500 No improvement in peripheral edema   Objective:  Vital signs in last 24 hours:  Temp:  [97.4 F (36.3 C)-97.6 F (36.4 C)] 97.4 F (36.3 C) (10/04 0527) Pulse Rate:  [70-79] 73 (10/04 0527) Resp:  [18-20] 20 (10/04 0527) BP: (118-123)/(62-97) 121/62 mmHg (10/04 0527) SpO2:  [92 %-99 %] 92 % (10/04 0527) Weight:  [85.231 kg (187 lb 14.4 oz)] 85.231 kg (187 lb 14.4 oz) (10/04 0500)  Weight change: 1.678 kg (3 lb 11.2 oz) Filed Weights   11/19/14 0622 11/20/14 0500 11/21/14 0500  Weight: 82.129 kg (181 lb 1 oz) 83.553 kg (184 lb 3.2 oz) 85.231 kg (187 lb 14.4 oz)    Intake/Output:    Intake/Output Summary (Last 24 hours) at 11/21/14 1415 Last data filed at 11/21/14 1003  Gross per 24 hour  Intake     50 ml  Output    500 ml  Net   -450 ml     Physical Exam: General:  elderly, frail, laying in the bed   HEENT  moist mucous membranes, anicteric sclera   Neck  supple, no masses   Pulm/lungs  mild basilar crackles, normal effort , [place] oxygen 4 Litres  CVS/Heart  irregular rhythm   Abdomen:   soft, nontender, nondistended   Extremities:  2-3+ dependent edema   Neurologic:  alert, oriented, speech normal   Skin:  no acute rashes, skin breakdown over shins      GU  Foley in place     Basic Metabolic Panel:   Recent Labs Lab 11/17/14 0554 11/18/14 0542 11/19/14 0448 11/20/14 1304 11/21/14 0541  NA 139 138 137 131* 136  K 4.2 4.9 5.3* 4.9 4.9  CL 98* 97* 95* 90* 96*  CO2 34* 35* 34* 31 34*  GLUCOSE 36* 135* 107* 137* 101*  BUN 56* 57* 60* 67* 68*  CREATININE 1.66* 1.59* 1.76* 2.02* 2.09*  CALCIUM 8.3* 8.1* 8.0* 7.9* 8.1*     CBC:  Recent Labs Lab 11/15/14 1013 11/16/14 0527 11/19/14 0448  WBC 4.1 4.8 6.2  HGB 8.7* 8.7* 9.3*  HCT 29.1* 28.7* 28.8*  MCV 80.0 79.5* 80.0  PLT 252 252 279      Microbiology:  Recent Results (from the past 720  hour(s))  Blood Culture (routine x 2)     Status: None   Collection Time: 11/09/14  2:34 PM  Result Value Ref Range Status   Specimen Description BLOOD RIGHT ARM  Final   Special Requests BOTTLES DRAWN AEROBIC AND ANAEROBIC 3CC  Final   Culture NO GROWTH 5 DAYS  Final   Report Status 11/14/2014 FINAL  Final  Urine culture     Status: None   Collection Time: 11/09/14  2:34 PM  Result Value Ref Range Status   Specimen Description URINE, CLEAN CATCH  Final   Special Requests NONE  Final   Culture   Final    >=100,000 COLONIES/mL ESCHERICHIA COLI ESBL-EXTENDED SPECTRUM BETA LACTAMASE-THE ORGANISM IS RESISTANT TO PENICILLINS, CEPHALOSPORINS AND AZTREONAM ACCORDING TO CLSI M100-S15 VOL.25 N01 JAN 2005. CRITICAL RESULT CALLED TO, READ BACK BY AND VERIFIED WITH: DANIEL JACOBS,RN 11/11/2014 0909 JRS.    Report Status 11/11/2014 FINAL  Final   Organism ID, Bacteria ESCHERICHIA COLI  Final      Susceptibility   Escherichia coli - MIC*    AMPICILLIN >=32 RESISTANT Resistant     CEFAZOLIN >=64 RESISTANT Resistant  CEFTRIAXONE >=64 RESISTANT Resistant     CIPROFLOXACIN >=4 RESISTANT Resistant     GENTAMICIN <=1 SENSITIVE Sensitive     IMIPENEM <=0.25 SENSITIVE Sensitive     NITROFURANTOIN <=16 SENSITIVE Sensitive     TRIMETH/SULFA >=320 RESISTANT Resistant     Extended ESBL POSITIVE Resistant     PIP/TAZO Value in next row Sensitive      SENSITIVE8    LEVOFLOXACIN Value in next row Resistant      RESISTANT>=8    * >=100,000 COLONIES/mL ESCHERICHIA COLI  MRSA PCR Screening     Status: None   Collection Time: 11/09/14  6:24 PM  Result Value Ref Range Status   MRSA by PCR NEGATIVE NEGATIVE Final    Comment:        The GeneXpert MRSA Assay (FDA approved for NASAL specimens only), is one component of a comprehensive MRSA colonization surveillance program. It is not intended to diagnose MRSA infection nor to guide or monitor treatment for MRSA infections.     Coagulation  Studies: No results for input(s): LABPROT, INR in the last 72 hours.  Urinalysis: No results for input(s): COLORURINE, LABSPEC, PHURINE, GLUCOSEU, HGBUR, BILIRUBINUR, KETONESUR, PROTEINUR, UROBILINOGEN, NITRITE, LEUKOCYTESUR in the last 72 hours.  Invalid input(s): APPERANCEUR    Imaging: No results found.   Medications:   . furosemide (LASIX) infusion 4 mg/hr (11/21/14 1346)   . acidophilus  1 capsule Oral BID  . albumin human  12.5 g Intravenous BID  . amiodarone  200 mg Oral Daily  . antiseptic oral rinse  7 mL Mouth Rinse BID  . apixaban  2.5 mg Oral BID  . atorvastatin  40 mg Oral q1800  . docusate sodium  100 mg Oral BID  . feeding supplement (ENSURE ENLIVE)  237 mL Oral TID WC  . gabapentin  300 mg Oral QHS  . insulin aspart  0-5 Units Subcutaneous QHS  . insulin aspart  0-9 Units Subcutaneous TID WC  . insulin glargine  22 Units Subcutaneous QHS  . levothyroxine  100 mcg Oral QAC breakfast  . lubiprostone  24 mcg Oral BID WC  . nystatin cream   Topical BID  . pantoprazole  40 mg Oral Daily  . sodium chloride  3 mL Intravenous Q12H   acetaminophen **OR** acetaminophen, albuterol, alum & mag hydroxide-simeth, HYDROcodone-acetaminophen, nitroGLYCERIN, ondansetron **OR** ondansetron (ZOFRAN) IV, polyethylene glycol, promethazine, sodium chloride  Assessment/ Plan:  81 y.o. female with a PMHX of chronic systolic congestive heart failure, type 2 diabetes, hypertension, atrial fibrillation, DJD, COPD, coronary disease/CABG 1988, catheter 2015, recurrent UTIs, chronic kidney disease, who was admitted to [facility] on 11/09/2014 for evaluation of elevated creatinine above her baseline and increased swelling.   1. Acute renal failure on chronic kidney disease stage 4  . Patient's baseline creatinine is 1.66/GFR of 28. CKD is likely secondary to atherosclerosis and hypertension. Acute worsening in creatinine is due to overdiuresis with metabolic alkalosis.   2. Hypertension and  Anasarca. From systolic congestive heart failure acute exacerbation. - Continue furosemide gtt  /hr - IV albumin give last two days without improvement.  - Low salt diet and fluid restriction.  - Appreciate cardiology input. Dobutamine gtt is probably the next option.     LOS: 12 Resa Rinks 10/4/20162:15 PM

## 2014-11-21 NOTE — Plan of Care (Signed)
Problem: Discharge Progression Outcomes Goal: Other Discharge Outcomes/Goals Outcome: Not Progressing 1. Pain-pt c/o pain this shift, prn meds given with improvement 2. Hemodynamically-             -VSS, pt remains afebrile this shift             -IV ABT continue per orders             -IV Lasix continue per orders 3. Complications-pt's edema is not improving with Lasix drip and pt will be moved to CCU for more aggressive diuresis 4. Diet-pt had poor po intake this shift 5. Activity-pt has been bed bound for the past 6 mos.

## 2014-11-21 NOTE — Consult Note (Signed)
Cardiology Consultation Note  Patient ID: [name], MRN: [id number], DOB/AGE: 79-Apr-1933 79 y.o. Admit date: 11/09/2014   Date of Consult: 11/21/2014 Primary Physician: Fidel Levy, MD Primary Cardiologist: Dr. Kirke Corin, MD  Chief Complaint: "My kidney numbers are high" Reason for Consult: Acute on chronic systolic CHF  HPI: 81 y.o. female with h/o CAD s/p CABG s/p PCI on SVGs, ischemic cardiomyopathy/chronic systolic CHF, PAF on Eliquis, PVD, COPD requiring nocturnal oxygen, CKD stage III, IDDM, HLD, and HTN who has been admitted recently to [facility] in mid June and mid July for ESBL UTI, HCAP and acute on chronic systolic CHF, mid August for ileus, and late August for HCAP again who presented to [facility] on 9/22 with from [facility] with acute on chronic CKD. Cardiology was consulted on 10/4 with acute on chronic systolic CHF in the setting of newly depressed EF of 20-25% by echo on 11/14/2014.   In April 2016, she was hospitalized for NSTEMI and underwent cardiac cath that showed occluded native vessels with patent LIMA to LAD and SVG to OM. The SVG to RCA is chronically occluded and fills via left-to-right collaterals. There was diffuse disease in the distal LAD beyond the anastomosis. Ejection fraction was 50 % by echo. There was no significant change from previous cardiac catheterization in 2012.  She has recently been hospitalized in June for ESBL UTI that was successfully treated. Follow up urine culture for clearing in mid July showed continued ESBL infection. She was admitted in mid July with HCAP and acute on chronic systolic CHF. Troponin with trend of 0.58-->0.59-->0.77-->1.74. This was felt to be 2/2 her underlying comorbidities. She was diuresed successfully and discharged home. She returned to [facility] in mid August with an ileus. Mild troponin elevation at 0.09. No angina. She was successfully treated. She returned again in late August with another episode of HCAP, her 4th admission  since June for either ESBL UTI or PNA, and was successfully treated with ABX. Troponin was mildly elevated in the setting of her PNA. It was recommended to treat the underlying cause of her symptoms.   She presented to [facility] on 9/22 with after undergoing routine blood work at [facility] and found that her SCr was elevated at 2.68 with a baseline of 1.8. K+ 5.4. She was sent to the hospital. At baseline she was on torsemide 40 mg q AM and 20 mg q PM and KCl 10 meq daily. She denied any increased fluids. She was otherwise feeling ok. She was started on low dose IV fluids and her torsemide was held. Nephrology was consulted who felt like her acute on chronic renal failure was 2/2 volume issues and ATN given her recent infections. She underwent repeat echo to assess LV function that showed EF 20-25%, diffuse HK with severe HK of the anterior, anteroseptal, and apical regions. Diastolic function was normal. Moderate MR. Left atrium was mildly dilated. RV systolic function was mildly reduced. Moderate TR. PASP 46 mm Hg. She was placed on a Lasix gtt, though has not diuresed much, a total 3680 mL for the admission. Her renal function remains above her baseline at 2.09 today. Her BP has remained stable in the 120s systolic. HGB 8 to 9 range. TSH normal. Albumin remains low at 3.1. On telemetry she has been in the 70s, sinus.    There has been no improvement in her peripheral edema with the above diuresis. She continues to note SOB. Currently on 2.5L via nasal cannula.    Past Medical  History  Diagnosis Date  . DM2 (diabetes mellitus, type 2)     onset age 15 insulin dependent  . Hyperlipidemia   . COPD (chronic obstructive pulmonary disease)   . Peripheral vascular disease   . CAD (coronary artery disease)     a. MI 1988 s/p CABG in 1988, multiple PCI on SVGs; b. cath 2012: occluded native coronary arteries, patent LIMA to LAD (distal LAD dz), patent SVG-OM & occluded SVG-RCA which filled via left to right  collats; c. cath 12/2013 patent LIMA-LAD & SVG-O. known occluded VG-RCA w/ L-R collats, no change since cath 2012  . Chronic systolic CHF (congestive heart failure)     a. 03/2012: EF 40-45%, mild lateral wall HK, mod inf HK, mod post wall HK, mild LVH, mild MR/TR   . Paroxysmal atrial fibrillation     a. CHADSVASc 7: yearly risk of CVA 9.6%;. b. on Eliquis 2.5 mg bid (age, SCr, borderline wt 62.9 Kg)   . HTN (hypertension)   . Yeast infection   . UTI (urinary tract infection)     Recurrent ESBL E.coli UTI  . Atherosclerosis of native artery of left lower extremity   . Myocardial infarction   . CKD (chronic kidney disease) stage 3, GFR 30-59 ml/min   . Hypoxia     on nocturnal o2      Most Recent Cardiac Studies: As above   Surgical History:  Past Surgical History  Procedure Laterality Date  . Abdominal hysterectomy    . Coronary artery bypass graft    . Appendectomy    . Cholecystectomy    . Ovary surgery    . Angioplasty / stenting femoral      Bilateral SFA stenting  . Femur surgery    . Cardiac catheterization      mc  . Cardiac catheterization  12/2013  . Right hip replacement    . Total knee arthroplasty      right knee     Home Meds: Prior to Admission medications   Medication Sig Start Date End Date Taking? Authorizing Provider  acidophilus (RISAQUAD) CAPS capsule Take 1 capsule by mouth 2 (two) times daily. 10/24/14  Yes Katharina Caper, MD  albuterol (PROVENTIL) (2.5 MG/3ML) 0.083% nebulizer solution Take 3 mLs (2.5 mg total) by nebulization every 6 (six) hours as needed for wheezing or shortness of breath. 09/12/14  Yes [name] Rusty Aus, NP  amiodarone (PACERONE) 200 MG tablet Take 1 tablet (200 mg total) by mouth daily. 02/15/14  Yes Iran Ouch, MD  apixaban (ELIQUIS) 2.5 MG TABS tablet Take 1 tablet (2.5 mg total) by mouth 2 (two) times daily. 05/08/14  Yes Antonieta Iba, MD  benazepril (LOTENSIN) 20 MG tablet Take 20 mg by mouth daily.   Yes Historical  Provider, MD  carvedilol (COREG) 3.125 MG tablet Take 1 tablet (3.125 mg total) by mouth 2 (two) times daily. 02/15/14  Yes Iran Ouch, MD  Cranberry 1000 MG CAPS Take 1 capsule (1,000 mg total) by mouth daily. 10/24/14  Yes Katharina Caper, MD  docusate sodium (COLACE) 100 MG capsule Take 1 capsule (100 mg total) by mouth 2 (two) times daily. 09/30/14  Yes Srikar Sudini, MD  gabapentin (NEURONTIN) 400 MG capsule Take 400 mg by mouth 3 (three) times daily.   Yes Historical Provider, MD  HYDROcodone-acetaminophen (NORCO/VICODIN) 5-325 MG per tablet Take 1-2 tablets by mouth 2 (two) times daily as needed for moderate pain.   Yes Historical Provider, MD  insulin lispro (  HUMALOG) 100 UNIT/ML injection Inject 0.04 mLs (4 Units total) into the skin 3 (three) times daily before meals. Patient taking differently: Inject 10 Units into the skin 3 (three) times daily before meals.  06/18/14  Yes Alford Highland, MD  isosorbide mononitrate (IMDUR) 60 MG 24 hr tablet Take 90 mg by mouth daily.    Yes Historical Provider, MD  LANTUS SOLOSTAR 100 UNIT/ML Solostar Pen Inject 32 Units into the skin at bedtime.  09/15/14  Yes Historical Provider, MD  levothyroxine (SYNTHROID, LEVOTHROID) 100 MCG tablet Take 100 mcg by mouth daily.    Yes Historical Provider, MD  lubiprostone (AMITIZA) 24 MCG capsule Take 24 mcg by mouth 2 (two) times daily with a meal.   Yes Historical Provider, MD  nitroGLYCERIN (NITROSTAT) 0.4 MG SL tablet Place 1 tablet (0.4 mg total) under the tongue every 5 (five) minutes as needed for chest pain. 06/18/14  Yes Alford Highland, MD  polyethylene glycol [facility] / GLYCOLAX) packet Take 17 g by mouth daily as needed for mild constipation or moderate constipation. 09/30/14  Yes Srikar Sudini, MD  potassium chloride (K-DUR) 10 MEQ tablet Take 10 mEq by mouth daily.    Yes Historical Provider, MD  pravastatin (PRAVACHOL) 40 MG tablet Take 1 tablet (40 mg total) by mouth daily. 10/03/14  Yes [name] Rusty Aus,  NP  torsemide (DEMADEX) 20 MG tablet Take 40 mg in the morning and 20 mg in the afternoon. Patient taking differently: Take 20 mg by mouth 2 (two) times daily.  09/11/14  Yes Iran Ouch, MD    Inpatient Medications:  . acidophilus  1 capsule Oral BID  . albumin human  12.5 g Intravenous BID  . amiodarone  200 mg Oral Daily  . antiseptic oral rinse  7 mL Mouth Rinse BID  . apixaban  2.5 mg Oral BID  . atorvastatin  40 mg Oral q1800  . docusate sodium  100 mg Oral BID  . ertapenem  500 mg Intravenous Q24H  . feeding supplement (ENSURE ENLIVE)  237 mL Oral TID WC  . gabapentin  300 mg Oral QHS  . insulin aspart  0-5 Units Subcutaneous QHS  . insulin aspart  0-9 Units Subcutaneous TID WC  . insulin glargine  22 Units Subcutaneous QHS  . levothyroxine  100 mcg Oral QAC breakfast  . lubiprostone  24 mcg Oral BID WC  . nystatin cream   Topical BID  . pantoprazole  40 mg Oral Daily  . sodium chloride  3 mL Intravenous Q12H   . furosemide (LASIX) infusion 4 mg/hr (11/19/14 1742)    Allergies:  Allergies  Allergen Reactions  . Contrast Media [Iodinated Diagnostic Agents] Shortness Of Breath  . Tramadol Nausea Only  . Valium [Diazepam] Other (See Comments)    Reaction:  Elevated heart rate  . Iodine Strong [Iodine] Rash  . Penicillins Rash    Social History   Social History  . Marital Status: Widowed    Spouse Name: N/A  . Number of Children: N/A  . Years of Education: N/A   Occupational History  . Not on file.   Social History Main Topics  . Smoking status: Former Smoker -- 15 years    Types: Cigarettes    Quit date: 02/18/1988  . Smokeless tobacco: Never Used  . Alcohol Use: No  . Drug Use: No  . Sexual Activity: No   Other Topics Concern  . Not on file   Social History Narrative   Lives at home  with granddaughter. Has a wheelchair.     Family History  Problem Relation Age of Onset  . Diabetes Mother   . Heart disease Mother   . Hypertension Mother     . Heart disease Father   . Heart disease Brother   . CVA Father      Review of Systems: Review of Systems  Constitutional: Positive for malaise/fatigue. Negative for fever, chills, weight loss and diaphoresis.  HENT: Negative for congestion.   Eyes: Negative for discharge and redness.  Respiratory: Positive for cough and shortness of breath. Negative for hemoptysis, sputum production and wheezing.   Cardiovascular: Positive for leg swelling. Negative for chest pain, palpitations, orthopnea, claudication and PND.       LE edema from bilateral knees proximal   Gastrointestinal: Negative for nausea and vomiting.  Musculoskeletal: Positive for myalgias and back pain. Negative for falls.  Skin: Negative for itching and rash.  Neurological: Positive for weakness. Negative for dizziness, tingling, tremors, sensory change, speech change, focal weakness, seizures and loss of consciousness.  Endo/Heme/Allergies: Does not bruise/bleed easily.  Psychiatric/Behavioral: Positive for depression. Negative for substance abuse. The patient is not nervous/anxious.   All other systems reviewed and are negative.   Labs: No results for input(s): CKTOTAL, CKMB, TROPONINI in the last 72 hours. Lab Results  Component Value Date   WBC 6.2 11/19/2014   HGB 9.3* 11/19/2014   HCT 28.8* 11/19/2014   MCV 80.0 11/19/2014   PLT 279 11/19/2014    Recent Labs Lab 11/19/14 0448  11/21/14 0541  NA 137  < > 136  K 5.3*  < > 4.9  CL 95*  < > 96*  CO2 34*  < > 34*  BUN 60*  < > 68*  CREATININE 1.76*  < > 2.09*  CALCIUM 8.0*  < > 8.1*  PROT 5.7*  --   --   BILITOT 0.9  --   --   ALKPHOS 80  --   --   ALT 20  --   --   AST 61*  --   --   GLUCOSE 107*  < > 101*  < > = values in this interval not displayed. Lab Results  Component Value Date   CHOL 129 06/16/2014   HDL 33* 06/16/2014   LDLCALC 55 06/16/2014   TRIG 206* 06/16/2014   Lab Results  Component Value Date   DDIMER 0.93* 11/15/2010     Radiology/Studies:  Dg Chest 1 View  11/12/2014   CLINICAL DATA:  PICC line placement  EXAM: CHEST 1 VIEW  COMPARISON:  11/09/2014  FINDINGS: Right PICC line is in place with the tip at the cavoatrial junction. Prior CABG. Cardiomegaly. Concern for bilateral airspace opacities, new since prior study. This could represent edema. No visible effusions.  IMPRESSION: Right PICC line tip at the cavoatrial junction.  Cardiomegaly. New diffuse bilateral airspace disease concerning for edema/CHF.   Electronically Signed   By: Charlett Nose M.D.   On: 11/12/2014 14:28   US Renal  11/11/2014   CLINICAL DATA:  Acute renal failure  EXAM: RENAL / URINARY TRACT ULTRASOUND COMPLETE  COMPARISON:  CT 10/20/2014  FINDINGS: Right Kidney:  Length: 10.8 cm. Slightly increased echotexture. No mass or hydronephrosis visualized.  Left Kidney:  Length: 8.9 cm. Increased echotexture. No mass or hydronephrosis visualized.  Bladder:  Appears normal for degree of bladder distention.  Ascites noted in the abdomen adjacent to the liver and spleen as seen on recent CT  IMPRESSION:  Increased echotexture within the kidneys bilaterally suggesting chronic medical renal disease. No hydronephrosis.  Ascites.   Electronically Signed   By: Charlett Nose M.D.   On: 11/11/2014 09:48   Dg Chest Port 1 View  11/15/2014   CLINICAL DATA:  Cough and congestion, onset this evening.  EXAM: PORTABLE CHEST 1 VIEW  COMPARISON:  Chest radiograph 11/12/2014  FINDINGS: Tip of the right upper extremity PICC in the distal SVC. Patient is post median sternotomy. There is stable cardiomegaly. Diminished bilateral airspace disease from prior exam, likely improved edema. Mild right infrahilar and left suprahilar atelectasis. Limited left basilar assessment scenario soft tissue attenuation. Chronic deformity about the left proximal humerus, unchanged.  IMPRESSION: 1. Findings suggest resolving pulmonary edema.  Stable cardiomegaly. 2. Tip of the right upper  extremity PICC in the distal SVC.   Electronically Signed   By: Rubye Oaks M.D.   On: 11/15/2014 21:58   Dg Chest Port 1 View  11/09/2014   CLINICAL DATA:  Sepsis.  Hypoxia.  EXAM: PORTABLE CHEST 1 VIEW  COMPARISON:  October 18, 2014.  FINDINGS: Stable cardiomegaly. Status post coronary artery bypass graft. No pneumothorax is noted. Left basilar opacity is noted concerning for pneumonia or atelectasis with associated pleural effusion. Mild right basilar opacity is noted concerning for subsegmental atelectasis or mild pleural effusion. Bony thorax is unremarkable.  IMPRESSION: Left basilar opacity is noted concerning for pneumonia or atelectasis with associated pleural effusion. Mild right basilar opacity is noted concerning for subsegmental atelectasis or pleural effusion.   Electronically Signed   By: Lupita Raider, M.D.   On: 11/09/2014 14:58    EKG: sinus bradycardiac, 57 bpm, RBBB, cannot rule out septal infarct   Weights: Filed Weights   11/19/14 0622 11/20/14 0500 11/21/14 0500  Weight: 181 lb 1 oz (82.129 kg) 184 lb 3.2 oz (83.553 kg) 187 lb 14.4 oz (85.231 kg)     Physical Exam: Blood pressure 121/62, pulse 73, temperature 97.4 F (36.3 C), temperature source Oral, resp. rate 20, height 5\' 1"  (1.549 m), weight 187 lb 14.4 oz (85.231 kg), SpO2 92 %. Body mass index is 35.52 kg/(m^2). General: Well developed, well nourished, in no acute distress. Head: Normocephalic, atraumatic, sclera non-icteric, no xanthomas, nares are without discharge.  Neck: Negative for carotid bruits. JVD elevated 8 cm. Lungs: Clear bilaterally to auscultation without wheezes, rales, or rhonchi. Breathing is unlabored. Heart: RRR with S1 S2. II/VI systolic murmur. No rubs, or gallops appreciated. Abdomen: Soft, non-tender, non-distended with normoactive bowel sounds. No hepatomegaly. No rebound/guarding. No obvious abdominal masses. Msk:  Strength and tone appear normal for age. Extremities: No clubbing  or cyanosis. 2+ bilateral pitting LE edema.  Distal pedal pulses are 2+ and equal bilaterally. Neuro: Alert and oriented X 3. No facial asymmetry. No focal deficit. Moves all extremities spontaneously. Psych:  Responds to questions appropriately with a normal affect.    Assessment and Plan:  81 y.o. female with h/o CAD s/p CABG s/p PCI on SVGs, ischemic cardiomyopathy/chronic systolic CHF, PAF on Eliquis, PVD, COPD requiring nocturnal oxygen, CKD stage III, IDDM, HLD, and HTN who has been admitted recently to [facility] in mid June and mid July for ESBL UTI, HCAP and acute on chronic systolic CHF, mid August for ileus, and late August for HCAP again who presented to [facility] on 9/22 with from [facility] with acute on chronic CKD. Cardiology was consulted on 10/4 with acute on chronic systolic CHF in the setting of newly depressed EF of  20-25% by echo on 11/14/2014.  1. Acute on chronic systolic CHF: -Leading to anasarca s/p albumin, LE edema, and HTN -EF has worsened from previous 50% by echo in 2014 to now 20-25% this admission -Has not diuresed well with Lasix gtt since admission on 9/22 -Would likely benefit from transfer to CCU for initiation of dopamine gtt at 5 mcgs/min -Consider dual therapy with dobutamine, will discuss with MD -Continue Lasix gtt at 4 mg/hr per nephrology  -Restart Coreg 3.125 mg bid when able given her significant cardiomyopathy  -Renal function may preclude starting Entresto in the future, thus may need to use Imdur/hydralazine for heart failure   2. Acute on CKD stage IV: -Stable  -Likely in the setting of HTN and atherosclerosis  -Acute worsening possibly 2/2 overdiuresis with metabolic alkalosis and recent infections  -IV albumin as above -Would benefit from dopamine infusion as above  3. PAF: -Currently in sinus rhythm -Restart Coreg as above -Amiodarone 200 mg daily  -Eliquis 2.5 mg bid -CHADSVASc at least 7 (CHF, HTN, DM, vascular disease, age x 2, female)  4.  CAD s/p CABG s/p PCI as above: -No angina -Would benefit from ischemic work up at later date given her newly found depressed EF -On Eliquis in place of aspirin  -Coreg as above -Lipitor 40 mg  5. DM2: -SSI per IM     Signed, Eula Listen, PA-C Pager: [phone] 11/21/2014, 12:28 PM

## 2014-11-21 NOTE — Plan of Care (Signed)
Problem: Discharge Progression Outcomes Goal: Other Discharge Outcomes/Goals Outcome: Progressing Plan of Care Progress to Goal:   Pt is alert and report pain x1 during shift. Pt has been resting comfortably during shift. VSS. No other signs of distress noted. Will continue to monitor.

## 2014-11-21 NOTE — Progress Notes (Signed)
[facility] [place] at [facility]   PATIENT NAME: [name]    MR#:  [id number]  DATE OF BIRTH:  [birth year]  SUBJECTIVE:   have generalized swelling.   She is thankful and satisfied with overall improvement, said her swelling is much better than she came with. Urine out put is decreasing.   REVIEW OF SYSTEMS:    Review of Systems  Constitutional: Negative for fever, chills and malaise/fatigue.  HENT: Negative for sore throat.   Eyes: Negative for blurred vision.  Respiratory: Negative for cough, hemoptysis, shortness of breath and wheezing.   Cardiovascular: Positive for leg swelling. Negative for chest pain and palpitations.  Gastrointestinal: Negative for nausea, vomiting, abdominal pain, diarrhea and blood in stool.  Genitourinary: Negative for dysuria.  Musculoskeletal: Negative for back pain.  Neurological: Negative for dizziness, tremors and headaches.  Endo/Heme/Allergies: Does not bruise/bleed easily.    Tolerating Diet: Yes   DRUG ALLERGIES:   Allergies  Allergen Reactions  . Contrast Media [Iodinated Diagnostic Agents] Shortness Of Breath  . Tramadol Nausea Only  . Valium [Diazepam] Other (See Comments)    Reaction:  Elevated heart rate  . Iodine Strong [Iodine] Rash  . Penicillins Rash    VITALS:  Blood pressure 121/67, pulse 98, temperature 97.6 F (36.4 C), temperature source Oral, resp. rate 14, height  (1.549 m), weight 85.231 kg (187 lb 14.4 oz), SpO2 93 %.  PHYSICAL EXAMINATION:   Physical Exam  Constitutional: She is oriented to person, place, and time and well-developed, well-nourished, and in no distress. No distress.  HENT:  Head: Normocephalic.  Eyes: No scleral icterus.  Neck: Normal range of motion. Neck supple. No JVD present. No tracheal deviation present.  Cardiovascular: Normal rate, regular rhythm and normal heart sounds.  Exam reveals no gallop and no friction rub.   No murmur  heard. Pulmonary/Chest: Effort normal and breath sounds normal. No respiratory distress. She has no wheezes. She has no rales. She exhibits no tenderness.  Abdominal: Soft. Bowel sounds are normal. She exhibits no distension and no mass. There is no tenderness. There is no rebound and no guarding.  Edema on abdominal wall and pelvic area present.  Musculoskeletal: Normal range of motion. She exhibits edema.  Neurological: She is alert and oriented to person, place, and time.  Skin: Skin is warm. No rash noted. No erythema.  Breakdowns on both legs.  Psychiatric: Affect and judgment normal.    LABORATORY PANEL:   CBC  Recent Labs Lab 11/19/14 0448  WBC 6.2  HGB 9.3*  HCT 28.8*  PLT 279   ------------------------------------------------------------------------------------------------------------------  Chemistries   Recent Labs Lab 11/19/14 0448  11/21/14 0541  NA 137  < > 136  K 5.3*  < > 4.9  CL 95*  < > 96*  CO2 34*  < > 34*  GLUCOSE 107*  < > 101*  BUN 60*  < > 68*  CREATININE 1.76*  < > 2.09*  CALCIUM 8.0*  < > 8.1*  AST 61*  --   --   ALT 20  --   --   ALKPHOS 80  --   --   BILITOT 0.9  --   --   < > = values in this interval not displayed. ------------------------------------------------------------------------------------------------------------------  Cardiac Enzymes No results for input(s): TROPONINI in the last 168 hours. ------------------------------------------------------------------------------------------------------------------  RADIOLOGY:  No results found.   ASSESSMENT AND PLAN:   81 year old female with a history of chronic systolic heart failure,  DJD with functional quadriplegia, type 2 diabetes and hypertension who presented with elevation in her creatinine and edema.  1. Acute on chronic renal failure:  Patient's creatinine has much improved initially but  - now for last 2-3 days gradual worsening..  currently on Lasix drip for > 1  week. Nephrology is following. Renal ultrasound showed medical renal disease. Continue to avoid nephrotoxic agents. decreased gabapentin dose. Her acute worsening in creatinine is likely secondary to recent UTI from ESBL leading to volume issues and ATN. She also have poor oral intake, i called dietary consult to help with supplements.    Due to very Low EF- there may be component of cardio renal syndrome.    Called cardio consult.  2. ESBL urinary tract infection:  on Invanz which was started on September 24. She has PICC line and will need 10 days of antibiotics. Up till 11/21/14- stopped now.  3. Acute on chronic systolic heart failure with anasarca: Patient's currently on Lasix drip. Her echocardiogram showed EF of 20% with hypokinesis of several areas.  continue Lasix drip did gave decreased urine and worsening renal func- so called Cardio consult- likely cardiorenal syndrome- and may need dobutamine.  4. CAD status post CABG: Patient is not on ACE inhibitor/ARB due to hypotension and acute renal failure. Blood pressure is low normal in the setting of using Lasix drip.  5. Atrial fibrillation:  heart rate is controlled. Patient will continue on Eliquis and amiodarone.  6. Diabetes type 2: Continue Lantus and sliding scale insulin.  7. Hypothyroidism   On Levothyroxin, check TSH.  She lives at [facility]. Still have significant edema.  Management plans discussed with the patient and she is in agreement.  CODE STATUS: Full  TOTAL TIME TAKING CARE OF THIS PATIENT: 35 minutes.  i discussed the condition and co-ordinated plan with Nephrology and cardiology.  POSSIBLE D/C 2-3 days, DEPENDING ON CLINICAL CONDITION.   Altamese Dilling M.D on 11/21/2014 at 10:22 PM  Between 7am to 6pm - Pager - [phone] After 6pm go to [email or web address] - password [email or web address] [facility]  [facility]  Office  [phone]  CC: Primary care physician; Fidel Levy, MD

## 2014-11-21 NOTE — Progress Notes (Signed)
Nutrition Follow-up   INTERVENTION:   Meals and Snacks: Cater to patient preferences, will send egg salad as 3pm snack. Encouraged intake including egg salad. Medical Food Supplement Therapy: pt agreeable to trying Ensure; recommend sending TID for added nutrition Coordination of Care: Recommend calorie count, as documentation limited since admission  and pt on isolation.   NUTRITION DIAGNOSIS:   Inadequate oral intake related to poor appetite as evidenced by per patient/family report.  GOAL:   Patient will meet greater than or equal to 90% of their needs; ongoing  MONITOR:    (Energy Intake, Anthropometrics, Electrolyte and Renal Profile, digestive system)   ASSESSMENT:   Pt with anasarca secondary to CHF continues on Lasix drip. Nephrology following.   Diet Order:  Diet 2 gram sodium Room service appropriate?: Yes; Fluid consistency:: Thin    Current Nutrition: Pt ate 50% of french toast and Total cereal this am on visit. Pt reports eating all of tomato soup last night. Limited documentation of I/O per chart. Pt reports Mighty Shake is too sweet and does not like any more. Pt reports meats are often too tough to eat.    Gastrointestinal Profile: Last BM: 11/20/2014   Medications: Lasix drip, Protonix,Lantus, Novolog, albumin, colace  Electrolyte/Renal Profile and Glucose Profile:   Recent Labs Lab 11/19/14 0448 11/20/14 1304 11/21/14 0541  NA 137 131* 136  K 5.3* 4.9 4.9  CL 95* 90* 96*  CO2 34* 31 34*  BUN 60* 67* 68*  CREATININE 1.76* 2.02* 2.09*  CALCIUM 8.0* 7.9* 8.1*  GLUCOSE 107* 137* 101*   Protein Profile:  Recent Labs Lab 11/19/14 0448  ALBUMIN 3.1*     Weight Trend since Admission: Filed Weights   11/19/14 0622 11/20/14 0500 11/21/14 0500  Weight: 181 lb 1 oz (82.129 kg) 184 lb 3.2 oz (83.553 kg) 187 lb 14.4 oz (85.231 kg)    BMI:  Body mass index is 35.52 kg/(m^2).  Estimated Nutritional Needs:   Kcal:  using IBW of 48kg, BEE:  882kcals, TEE: (IF 1.0-1.2)(AF 1.3) 1146-1490kcals  Protein:  using IBW of 48kg, 48-58g protein (1.0-1.2g/kg)  Fluid:  using IBW of 48kg, (25-34mL/kg) 1440-1615mL/kg  EDUCATION NEEDS:   No education needs identified at this time    HIGH Care Level  Leda Quail, RD, LDN Pager [phone]

## 2014-11-21 NOTE — Progress Notes (Signed)
PT Cancellation Note  Patient Details Name: [name] MRN: [id number] DOB: [birth year]   Cancelled Treatment:    Reason Eval/Treat Not Completed: Patient not medically ready. Spoke with nursing, pt ready to be transferred to CCU floor. Check patient status tomorrow.    Elsie Stain Bishop 11/21/2014, 3:05 PM

## 2014-11-21 NOTE — Progress Notes (Signed)
Plan is for patient to return to [facility] for short term rehab when she is medically stable to D/C from the hospital. Clinical Social Worker (CSW) contacted [employer] at [facility] and made him aware that patient is on isolation for ESBL in her urine. Per Raiford Noble they will make arrangements for patient to go to a private room. Per RN patient is not ready for D/C today and is finishing her last does of IV Abx today. Per RN patient will probably not require IV Abx at D/C. CSW will continue to follow and assist as needed.   Jetta Lout, LCSWA [phone]

## 2014-11-21 NOTE — Plan of Care (Signed)
Problem: Discharge Progression Outcomes Goal: Medication needs addressed/assessed Outcome: Progressing Sent to CCU for dobutamine, dopamine and lasix gtt.  Action, purpose and side effects discussed with patient. Lung fields with fine crackles and diminished bilateral Goal: Barriers To Progression Addressed/Resolved Outcome: Progressing aggressive diuresis and inotropic therapy needed Goal: Pain controlled with appropriate interventions Outcome: Progressing Norco relieves hip pain.  No c/o chestpain. Goal: Tolerating diet Outcome: Progressing Poor appetite. Ensure and peach jello are her favorites

## 2014-11-21 NOTE — Progress Notes (Signed)
Reported call to CCU and this nurse and orderly transported pt to CCU via bed.  Orson Ape, RN

## 2014-11-21 NOTE — Progress Notes (Signed)
Initial appointment was made at the outpatient [facility] on December 11, 2014 at 11:00am. Thank you

## 2014-11-22 ENCOUNTER — Inpatient Hospital Stay: Payer: Medicare Other

## 2014-11-22 DIAGNOSIS — I10 Essential (primary) hypertension: Secondary | ICD-10-CM

## 2014-11-22 DIAGNOSIS — N170 Acute kidney failure with tubular necrosis: Secondary | ICD-10-CM

## 2014-11-22 DIAGNOSIS — I5043 Acute on chronic combined systolic (congestive) and diastolic (congestive) heart failure: Principal | ICD-10-CM

## 2014-11-22 DIAGNOSIS — R7989 Other specified abnormal findings of blood chemistry: Secondary | ICD-10-CM

## 2014-11-22 LAB — GLUCOSE, CAPILLARY
GLUCOSE-CAPILLARY: 192 mg/dL — AB (ref 65–99)
GLUCOSE-CAPILLARY: 171 mg/dL — AB (ref 65–99)
Glucose-Capillary: 142 mg/dL — ABNORMAL HIGH (ref 65–99)
Glucose-Capillary: 155 mg/dL — ABNORMAL HIGH (ref 65–99)
Glucose-Capillary: 142 mg/dL — ABNORMAL HIGH (ref 65–99)

## 2014-11-22 LAB — CBC
HCT: 28.1 % — ABNORMAL LOW (ref 35.0–47.0)
Hemoglobin: 8.9 g/dL — ABNORMAL LOW (ref 12.0–16.0)
MCH: 25.4 pg — AB (ref 26.0–34.0)
MCHC: 31.8 g/dL — AB (ref 32.0–36.0)
MCV: 79.9 fL — AB (ref 80.0–100.0)
PLATELETS: 223 10*3/uL (ref 150–440)
RBC: 3.52 MIL/uL — ABNORMAL LOW (ref 3.80–5.20)
RDW: 27 % — AB (ref 11.5–14.5)
WBC: 5.7 10*3/uL (ref 3.6–11.0)

## 2014-11-22 LAB — BASIC METABOLIC PANEL
Anion gap: 7 (ref 5–15)
BUN: 68 mg/dL — ABNORMAL HIGH (ref 6–20)
CALCIUM: 8.2 mg/dL — AB (ref 8.9–10.3)
CHLORIDE: 95 mmol/L — AB (ref 101–111)
CO2: 34 mmol/L — ABNORMAL HIGH (ref 22–32)
CREATININE: 1.92 mg/dL — AB (ref 0.44–1.00)
GFR calc non Af Amer: 23 mL/min — ABNORMAL LOW (ref >60)
GFR, EST AFRICAN AMERICAN: 27 mL/min — AB (ref >60)
GLUCOSE: 168 mg/dL — AB (ref 65–99)
Potassium: 4.6 mmol/L (ref 3.5–5.1)
Sodium: 136 mmol/L (ref 135–145)

## 2014-11-22 NOTE — Progress Notes (Signed)
Patient transferred to ICU from 1C. Clinical Child psychotherapist (CSW) sent message to ICU social worker.  Jetta Lout, LCSWA [phone]

## 2014-11-22 NOTE — Progress Notes (Signed)
Patient: [name] / Admit Date: 11/09/2014 / Date of Encounter: 11/22/2014, 11:46 AM   Subjective: Feeling better this morning. SCr has improved this morning to 1.92 from 2.09 on dopamine and dobutamine. BP improved. Has urine output of 4.3L.   Review of Systems: Review of Systems  Constitutional: Positive for weight loss and malaise/fatigue. Negative for fever, chills and diaphoresis.  Respiratory: Positive for cough and shortness of breath. Negative for hemoptysis, sputum production and wheezing.   Cardiovascular: Positive for leg swelling. Negative for chest pain, palpitations, orthopnea, claudication and PND.  Gastrointestinal: Negative for nausea and vomiting.  Musculoskeletal: Negative for falls.  Neurological: Positive for weakness. Negative for sensory change, speech change and focal weakness.  Endo/Heme/Allergies: Does not bruise/bleed easily.  Psychiatric/Behavioral: The patient is not nervous/anxious.     Objective: Telemetry: NSR, 80's Physical Exam: Blood pressure 119/61, pulse 88, temperature 97.6 F (36.4 C), temperature source Oral, resp. rate 11, height  (1.549 m), weight 183 lb 13.8 oz (83.4 kg), SpO2 100 %. Body mass index is 34.76 kg/(m^2). General: Well developed, well nourished, in no acute distress. Head: Normocephalic, atraumatic, sclera non-icteric, no xanthomas, nares are without discharge. Neck: Negative for carotid bruits. Improved JVP. Lungs: Clear bilaterally to auscultation without wheezes, rales, or rhonchi. Breathing is unlabored. Heart: RRR S1 S2 without murmurs, rubs, or gallops.  Abdomen: Soft, non-tender, non-distended with normoactive bowel sounds. No rebound/guarding. Extremities: No clubbing or cyanosis. 2+ pitting edema LE. Distal pedal pulses are 2+ and equal bilaterally. Neuro: Alert and oriented X 3. Moves all extremities spontaneously. Psych:  Responds to questions appropriately with a normal affect.   Intake/Output  Summary (Last 24 hours) at 11/22/14 1146 Last data filed at 11/22/14 0900  Gross per 24 hour  Intake 431.75 ml  Output   1200 ml  Net -768.25 ml    Inpatient Medications:  . acidophilus  1 capsule Oral BID  . albumin human  12.5 g Intravenous BID  . amiodarone  200 mg Oral Daily  . antiseptic oral rinse  7 mL Mouth Rinse BID  . apixaban  2.5 mg Oral BID  . atorvastatin  40 mg Oral q1800  . docusate sodium  100 mg Oral BID  . feeding supplement (ENSURE ENLIVE)  237 mL Oral TID WC  . gabapentin  300 mg Oral QHS  . insulin aspart  0-5 Units Subcutaneous QHS  . insulin aspart  0-9 Units Subcutaneous TID WC  . insulin glargine  22 Units Subcutaneous QHS  . levothyroxine  100 mcg Oral QAC breakfast  . lubiprostone  24 mcg Oral BID WC  . nystatin cream   Topical BID  . pantoprazole  40 mg Oral Daily  . sodium chloride  3 mL Intravenous Q12H   Infusions:  . DOBUTamine 2.5 mcg/kg/min (11/22/14 0000)  . DOPamine 2.5 mcg/kg/min (11/21/14 2000)  . furosemide (LASIX) infusion 4 mg/hr (11/22/14 0000)    Labs:  Recent Labs  11/21/14 0541 11/22/14 0926  NA 136 136  K 4.9 4.6  CL 96* 95*  CO2 34* 34*  GLUCOSE 101* 168*  BUN 68* 68*  CREATININE 2.09* 1.92*  CALCIUM 8.1* 8.2*   No results for input(s): AST, ALT, ALKPHOS, BILITOT, PROT, ALBUMIN in the last 72 hours.  Recent Labs  11/22/14 0518  WBC 5.7  HGB 8.9*  HCT 28.1*  MCV 79.9*  PLT 223   No results for input(s): CKTOTAL, CKMB, TROPONINI in the last 72 hours. Invalid input(s): POCBNP No  results for input(s): HGBA1C in the last 72 hours.   Weights: Filed Weights   11/20/14 0500 11/21/14 0500 11/22/14 0500  Weight: 184 lb 3.2 oz (83.553 kg) 187 lb 14.4 oz (85.231 kg) 183 lb 13.8 oz (83.4 kg)     Radiology/Studies:  Dg Chest 1 View  11/22/2014   CLINICAL DATA:  Hypoxia  EXAM: CHEST 1 VIEW  COMPARISON:  11/15/2014  FINDINGS: Prior CABG. Cardiomegaly with vascular congestion and bilateral airspace opacities in the  perihilar and lower lobe regions, most compatible with edema/ CHF. Suspect small layering effusions.  IMPRESSION: Mild to moderate CHF.  Suspect small layering effusions.   Electronically Signed   By: Charlett Nose M.D.   On: 11/22/2014 08:35   Dg Chest 1 View  11/12/2014   CLINICAL DATA:  PICC line placement  EXAM: CHEST 1 VIEW  COMPARISON:  11/09/2014  FINDINGS: Right PICC line is in place with the tip at the cavoatrial junction. Prior CABG. Cardiomegaly. Concern for bilateral airspace opacities, new since prior study. This could represent edema. No visible effusions.  IMPRESSION: Right PICC line tip at the cavoatrial junction.  Cardiomegaly. New diffuse bilateral airspace disease concerning for edema/CHF.   Electronically Signed   By: Charlett Nose M.D.   On: 11/12/2014 14:28   US Renal  11/11/2014   CLINICAL DATA:  Acute renal failure  EXAM: RENAL / URINARY TRACT ULTRASOUND COMPLETE  COMPARISON:  CT 10/20/2014  FINDINGS: Right Kidney:  Length: 10.8 cm. Slightly increased echotexture. No mass or hydronephrosis visualized.  Left Kidney:  Length: 8.9 cm. Increased echotexture. No mass or hydronephrosis visualized.  Bladder:  Appears normal for degree of bladder distention.  Ascites noted in the abdomen adjacent to the liver and spleen as seen on recent CT  IMPRESSION: Increased echotexture within the kidneys bilaterally suggesting chronic medical renal disease. No hydronephrosis.  Ascites.   Electronically Signed   By: Charlett Nose M.D.   On: 11/11/2014 09:48   Dg Chest Port 1 View  11/15/2014   CLINICAL DATA:  Cough and congestion, onset this evening.  EXAM: PORTABLE CHEST 1 VIEW  COMPARISON:  Chest radiograph 11/12/2014  FINDINGS: Tip of the right upper extremity PICC in the distal SVC. Patient is post median sternotomy. There is stable cardiomegaly. Diminished bilateral airspace disease from prior exam, likely improved edema. Mild right infrahilar and left suprahilar atelectasis. Limited left basilar  assessment scenario soft tissue attenuation. Chronic deformity about the left proximal humerus, unchanged.  IMPRESSION: 1. Findings suggest resolving pulmonary edema.  Stable cardiomegaly. 2. Tip of the right upper extremity PICC in the distal SVC.   Electronically Signed   By: Rubye Oaks M.D.   On: 11/15/2014 21:58   Dg Chest Port 1 View  11/09/2014   CLINICAL DATA:  Sepsis.  Hypoxia.  EXAM: PORTABLE CHEST 1 VIEW  COMPARISON:  October 18, 2014.  FINDINGS: Stable cardiomegaly. Status post coronary artery bypass graft. No pneumothorax is noted. Left basilar opacity is noted concerning for pneumonia or atelectasis with associated pleural effusion. Mild right basilar opacity is noted concerning for subsegmental atelectasis or mild pleural effusion. Bony thorax is unremarkable.  IMPRESSION: Left basilar opacity is noted concerning for pneumonia or atelectasis with associated pleural effusion. Mild right basilar opacity is noted concerning for subsegmental atelectasis or pleural effusion.   Electronically Signed   By: Lupita Raider, M.D.   On: 11/09/2014 14:58     Assessment and Plan  81 y.o. female with h/o CAD s/p  CABG s/p PCI on SVGs, ischemic cardiomyopathy/chronic systolic CHF, PAF on Eliquis, PVD, COPD requiring nocturnal oxygen, CKD stage III, IDDM, HLD, and HTN who has been admitted recently to [facility] in mid June and mid July for ESBL UTI, HCAP and acute on chronic systolic CHF, mid August for ileus, and late August for HCAP again who presented to [facility] on 9/22 with from [facility] with acute on chronic CKD. Cardiology was consulted on 10/4 with acute on chronic systolic CHF in the setting of newly depressed EF of 20-25% by echo on 11/14/2014.  1. Acute on chronic systolic CHF: -Improving  -Leading to anasarca s/p albumin, LE edema, and HTN -EF has worsened from previous 50% by echo in 2014 to now 20-25% this admission -Diuresis improving on dopamine and dobutamine gtts for today (10/5) -Plan to  change to afterload reduction on 10/6, followed by BB on 10/7 -Would continue dopamine and dobutamine gtts  -Continue Lasix gtt per nephrology  -Restart Coreg 3.125 mg bid when able given her significant cardiomyopathy  -Renal function may preclude starting Entresto in the future, thus may need to use Imdur/hydralazine for heart failure   2. Acute on CKD stage IV: -Improving  -Likely in the setting of HTN and atherosclerosis  -Acute worsening possibly 2/2 overdiuresis with metabolic alkalosis and recent infections  -IV albumin as above -Continue gtts as above  3. PAF: -Currently in sinus rhythm -Restart Coreg as above -Amiodarone 200 mg daily  -Eliquis 2.5 mg bid -CHADSVASc at least 7 (CHF, HTN, DM, vascular disease, age x 2, female)  4. CAD s/p CABG s/p PCI as above: -No angina -Would benefit from ischemic work up at later date given her newly found depressed EF -On Eliquis in place of aspirin  -Coreg as above -Lipitor 40 mg  5. DM2: -SSI per IM   Signed, Eula Listen, PA-C Pager: [phone] 11/22/2014, 11:46 AM  I saw examined the patient this morning along with Eula Listen, PA-C. We have reviewed the chart and discuss the findings, examination and recommendations as noted above -- per our discussion.  She was transferred to the unit last night started on dopamine plus dobutamine as well as Lasix drip. Her urine output has picked up, and she deathly feels better symptomatically. Renal function has improved somewhat not significantly. My recreational be to continue current course for at least another day if not 2. Her current afterload reduction agents and beta blockers are on hold while she is on pressors.  Continue to follow renal function. Would consider possibly starting to wean off dopamine by the end date tomorrow versus following day. With then consider starting to add back afterload reduction agents.  She is in sinus rhythm on beta blocker and amiodarone.  Anticoagulated with ELIQUIS. No active anginal symptoms. At that think she would probably benefit from an ischemic workup once she is stabilized.   Marykay Lex, M.D., M.S. Interventional Cardiologist   Pager # [phone]

## 2014-11-22 NOTE — Progress Notes (Signed)
[facility] [place] at [facility]   PATIENT NAME: [name]    MR#:  [id number]  DATE OF BIRTH:  [birth year]  SUBJECTIVE:   have generalized swelling.   She is thankful and satisfied with overall improvement, said her swelling is much better than she came with. Urine out put is decreasing.   REVIEW OF SYSTEMS:    Review of Systems  Constitutional: Negative for fever, chills and malaise/fatigue.  HENT: Negative for sore throat.   Eyes: Negative for blurred vision.  Respiratory: Negative for cough, hemoptysis, shortness of breath and wheezing.   Cardiovascular: Positive for leg swelling. Negative for chest pain and palpitations.  Gastrointestinal: Negative for nausea, vomiting, abdominal pain, diarrhea and blood in stool.  Genitourinary: Negative for dysuria.  Musculoskeletal: Negative for back pain.  Neurological: Negative for dizziness, tremors and headaches.  Endo/Heme/Allergies: Does not bruise/bleed easily.    Tolerating Diet: Yes   DRUG ALLERGIES:   Allergies  Allergen Reactions  . Contrast Media [Iodinated Diagnostic Agents] Shortness Of Breath  . Tramadol Nausea Only  . Valium [Diazepam] Other (See Comments)    Reaction:  Elevated heart rate  . Iodine Strong [Iodine] Rash  . Penicillins Rash    VITALS:  Blood pressure 107/58, pulse 88, temperature 97.7 F (36.5 C), temperature source Oral, resp. rate 24, height  (1.549 m), weight 83.4 kg (183 lb 13.8 oz), SpO2 97 %.  PHYSICAL EXAMINATION:   Physical Exam  Constitutional: She is oriented to person, place, and time and well-developed, well-nourished, and in no distress. No distress.  HENT:  Head: Normocephalic.  Eyes: No scleral icterus.  Neck: Normal range of motion. Neck supple. No JVD present. No tracheal deviation present.  Cardiovascular: Normal rate, regular rhythm and normal heart sounds.  Exam reveals no gallop and no friction rub.   No murmur  heard. Pulmonary/Chest: Effort normal and breath sounds normal. No respiratory distress. She has no wheezes. She has no rales. She exhibits no tenderness.  Abdominal: Soft. Bowel sounds are normal. She exhibits no distension and no mass. There is no tenderness. There is no rebound and no guarding.  Edema on abdominal wall and pelvic area present.  Musculoskeletal: Normal range of motion. She exhibits edema.  Neurological: She is alert and oriented to person, place, and time.  Skin: Skin is warm. No rash noted. No erythema.  Breakdowns on both legs.  Psychiatric: Affect and judgment normal.    LABORATORY PANEL:   CBC  Recent Labs Lab 11/22/14 0518  WBC 5.7  HGB 8.9*  HCT 28.1*  PLT 223   ------------------------------------------------------------------------------------------------------------------  Chemistries   Recent Labs Lab 11/19/14 0448  11/22/14 0926  NA 137  < > 136  K 5.3*  < > 4.6  CL 95*  < > 95*  CO2 34*  < > 34*  GLUCOSE 107*  < > 168*  BUN 60*  < > 68*  CREATININE 1.76*  < > 1.92*  CALCIUM 8.0*  < > 8.2*  AST 61*  --   --   ALT 20  --   --   ALKPHOS 80  --   --   BILITOT 0.9  --   --   < > = values in this interval not displayed. ------------------------------------------------------------------------------------------------------------------  Cardiac Enzymes No results for input(s): TROPONINI in the last 168 hours. ------------------------------------------------------------------------------------------------------------------  RADIOLOGY:  Dg Chest 1 View  11/22/2014   CLINICAL DATA:  Hypoxia  EXAM: CHEST 1 VIEW  COMPARISON:  11/15/2014  FINDINGS: Prior CABG. Cardiomegaly with vascular congestion and bilateral airspace opacities in the perihilar and lower lobe regions, most compatible with edema/ CHF. Suspect small layering effusions.  IMPRESSION: Mild to moderate CHF.  Suspect small layering effusions.   Electronically Signed   By: Charlett Nose  M.D.   On: 11/22/2014 08:35     ASSESSMENT AND PLAN:   81 year old female with a history of chronic systolic heart failure, DJD with functional quadriplegia, type 2 diabetes and hypertension who presented with elevation in her creatinine and edema.  1. Acute on chronic renal failure:  Patient's creatinine has much improved initially but  - now for last 2-3 days gradual worsening..  currently on Lasix drip for > 1 week. Nephrology is following. Renal ultrasound showed medical renal disease. Continue to avoid nephrotoxic agents. decreased gabapentin dose. Her acute worsening in creatinine is likely secondary to recent UTI from ESBL leading to volume issues and ATN. She also have poor oral intake,  dietary consult to help with supplements.    Due to very Low EF- there may be component of cardio renal syndrome.    Called cardio consult. Started on dopamin drip in CCU, showing good initial response.  2. ESBL urinary tract infection:  on Invanz which was started on September 24. She has PICC line and will need 10 days of antibiotics. Up till 11/21/14- stopped now.  3. Acute on chronic systolic heart failure with anasarca: Patient's currently on Lasix drip. Her echocardiogram showed EF of 20% with hypokinesis of several areas.  continue Lasix drip did gave decreased urine and worsening renal func- so called Cardio consult- likely cardiorenal syndrome- appreciated help.  4. CAD status post CABG: Patient is not on ACE inhibitor/ARB due to hypotension and acute renal failure. Blood pressure is low normal in the setting of using Lasix drip.  5. Atrial fibrillation:  heart rate is controlled. Patient will continue on Eliquis and amiodarone.  6. Diabetes type 2: Continue Lantus and sliding scale insulin.  7. Hypothyroidism   On Levothyroxin, check TSH.  She lives at [facility]. Still have significant edema.  Management plans discussed with the patient and she is in agreement.  CODE STATUS:  Full  TOTAL TIME TAKING CARE OF THIS PATIENT: 35 minutes.  i discussed the condition and co-ordinated plan with Nephrology and cardiology.  Spoke to her grand daughter- who is a Engineer, civil (consulting) and updated her about the condition.  POSSIBLE D/C 2-3 days, DEPENDING ON CLINICAL CONDITION.   Altamese Dilling M.D on 11/22/2014 at 10:44 PM  Between 7am to 6pm - Pager - [phone] After 6pm go to [email or web address] - password [email or web address] [facility]  [address] [place] [facility]  Office  [phone]  CC: Primary care physician; Fidel Levy, MD

## 2014-11-22 NOTE — Progress Notes (Addendum)
Physical Therapy Treatment Patient Details Name: [name] MRN: [id number] DOB: October 13, 1931 Today's Date: 11/22/2014    History of Present Illness Pt with past medical history of chronic systolic congestive heart failure, DJD with patient being bedbound for last 6 months, type 2 diabetes, hypertension, A. fib on a liquid comes to the emergency room after she was found to have elevated creatinine on her routine lab work that was done at her nursing home.    PT Comments    Pt lethargic, but agreeable to PT. Pt participates in supine bed exercises and notes B LE feeling better post session. Pt complains of B heels being sore. Pt initially positioned more on left side with pillow shifted and heels in contact with bed; repositioned more on R side with extra pillow to float heels. Continue PT to progress strength and endurance to improve functional mobility.   Follow Up Recommendations  SNF     Equipment Recommendations  Rolling walker with 5" wheels    Recommendations for Other Services       Precautions / Restrictions Restrictions Weight Bearing Restrictions: No    Mobility  Bed Mobility               General bed mobility comments: Max A to reposition in bed  Transfers                 General transfer comment: not tested  Ambulation/Gait             General Gait Details: non ambulatory   Stairs            Wheelchair Mobility    Modified Rankin (Stroke Patients Only)       Balance                                    Cognition Arousal/Alertness: Lethargic Behavior During Therapy: WFL for tasks assessed/performed Overall Cognitive Status: Within Functional Limits for tasks assessed                      Exercises General Exercises - Lower Extremity Ankle Circles/Pumps: AROM;Both;20 reps;Supine Quad Sets: Strengthening;Both;Supine;Other reps (comment) (12) Gluteal Sets: Strengthening;Both;Supine;Other reps  (comment) (12) Short Arc Quad: AAROM;Both;Other reps (comment);Supine (12) Heel Slides: AAROM;Both;Other reps (comment);Supine (12) Hip ABduction/ADduction: AAROM;Both;Other reps (comment);Supine (12)    General Comments        Pertinent Vitals/Pain Pain Assessment: 0-10 Pain Score: 8  Pain Location: L hip (with movement) Pain Intervention(s): Repositioned;Monitored during session    Home Living                      Prior Function            PT Goals (current goals can now be found in the care plan section) Progress towards PT goals: Progressing toward goals (slowly)    Frequency  Min 2X/week    PT Plan Current plan remains appropriate    Co-evaluation             End of Session Equipment Utilized During Treatment: Oxygen Activity Tolerance: Patient limited by lethargy (Pt notes B LE feel better post session) Patient left: in bed;with call bell/phone within reach;with bed alarm set     Time: 1131-1148 PT Time Calculation (min) (ACUTE ONLY): 17 min  Charges:  $Therapeutic Exercise: 8-22 mins  G Codes:      Kristeen Miss 11/22/2014, 11:56 AM

## 2014-11-22 NOTE — Consult Note (Signed)
WOC wound consult note Reason for Consult:Re assess wounds to lower extremities and sacrum  New onset intertriginous dermatitis, likely fungal to abdominal pannus and perineum.  Wound type: Ruptured blisters, resolving Fungal overgrowth  Moisture Associated Skin Damage to sacrum, improved but new onset fungal overgrowth to perineum  Pressure Ulcer POA: Yes Left medial malleolus, ruptured blister 0.5cm x 1cm 100% slough to wound bed.  Right anterior lower leg 2 cm x 2 cm new epithelium noted.  Will continue to protect Bilateral boggy heels intact 1 cm x1 cm blanchable redness Refuses PRevalon boots. Does have heels floated today with pillow under the calf.  Sacrum 4.5 cm x 4 cm x 0.1 cm Erythema improved. Will switch to Allevyn sacral dressing.  Interdry Ag to abdominal pannus and inguinal folds.    Drainage (amount, consistency, odor) None Periwound:Intact Dressing procedure/placement/frequency:Continue silicone foam dressings to lower extremities Begin sacral silicone foam dressing.  Change every 3 days and PRN soilage. Interdry Ag to abdominal pannus and inguinal folds.  Measure and cut length of InterDry Ag+ to fit in skin folds that have skin breakdown Tuck InterDry  Ag+ fabric into skin folds in a single layer, allow for 2 inches of overhang from skin edges to allow for wicking to occur May remove to bathe; dry area thoroughly and then tuck into affected areas again Do not apply any creams or ointments when using InterDry Ag+ DO NOT THROW AWAY FOR 5 DAYS unless soiled with stool DO NOT [facility] product, this will inactivate the silver in the material  New sheet of Interdry Ag+ should be applied after 5 days of use if patient continues to have skin breakdown

## 2014-11-22 NOTE — Progress Notes (Signed)
Subjective:   Moved to CCU. Placed on peripheral dopamine and dobutamine. Continues on furosemide 4mg  /hr gtt.  UOP 1025 No creatinine this morning.   Objective:  Vital signs in last 24 hours:  Temp:  [97.6 F (36.4 C)-98.5 F (36.9 C)] 97.6 F (36.4 C) (10/05 0800) Pulse Rate:  [73-98] 90 (10/05 0700) Resp:  [7-19] 9 (10/05 0700) BP: (104-128)/(56-98) 125/63 mmHg (10/05 0700) SpO2:  [90 %-100 %] 100 % (10/05 0700) Weight:  [83.4 kg (183 lb 13.8 oz)] 83.4 kg (183 lb 13.8 oz) (10/05 0500)  Weight change: -1.831 kg (-4 lb 0.6 oz) Filed Weights   11/20/14 0500 11/21/14 0500 11/22/14 0500  Weight: 83.553 kg (184 lb 3.2 oz) 85.231 kg (187 lb 14.4 oz) 83.4 kg (183 lb 13.8 oz)    Intake/Output:    Intake/Output Summary (Last 24 hours) at 11/22/14 0916 Last data filed at 11/22/14 0800  Gross per 24 hour  Intake 481.75 ml  Output   1200 ml  Net -718.25 ml     Physical Exam: General:  elderly, frail, laying in the bed   HEENT  moist mucous membranes, anicteric sclera   Neck  supple, no masses   Pulm/lungs  mild basilar crackles, normal effort , Warner oxygen 4 Litres  CVS/Heart  irregular rhythm   Abdomen:   soft, nontender, nondistended   Extremities:  2+ dependent edema   Neurologic:  alert, oriented, speech normal   Skin:  no acute rashes, skin breakdown over shins      GU  Foley in place     Basic Metabolic Panel:   Recent Labs Lab 11/17/14 0554 11/18/14 0542 11/19/14 0448 11/20/14 1304 11/21/14 0541  NA 139 138 137 131* 136  K 4.2 4.9 5.3* 4.9 4.9  CL 98* 97* 95* 90* 96*  CO2 34* 35* 34* 31 34*  GLUCOSE 36* 135* 107* 137* 101*  BUN 56* 57* 60* 67* 68*  CREATININE 1.66* 1.59* 1.76* 2.02* 2.09*  CALCIUM 8.3* 8.1* 8.0* 7.9* 8.1*     CBC:  Recent Labs Lab 11/15/14 1013 11/16/14 0527 11/19/14 0448 11/22/14 0518  WBC 4.1 4.8 6.2 5.7  HGB 8.7* 8.7* 9.3* 8.9*  HCT 29.1* 28.7* 28.8* 28.1*  MCV 80.0 79.5* 80.0 79.9*  PLT 252 252 279 223       Microbiology:  Recent Results (from the past 720 hour(s))  Blood Culture (routine x 2)     Status: None   Collection Time: 11/09/14  2:34 PM  Result Value Ref Range Status   Specimen Description BLOOD RIGHT ARM  Final   Special Requests BOTTLES DRAWN AEROBIC AND ANAEROBIC 3CC  Final   Culture NO GROWTH 5 DAYS  Final   Report Status 11/14/2014 FINAL  Final  Urine culture     Status: None   Collection Time: 11/09/14  2:34 PM  Result Value Ref Range Status   Specimen Description URINE, CLEAN CATCH  Final   Special Requests NONE  Final   Culture   Final    >=100,000 COLONIES/mL ESCHERICHIA COLI ESBL-EXTENDED SPECTRUM BETA LACTAMASE-THE ORGANISM IS RESISTANT TO PENICILLINS, CEPHALOSPORINS AND AZTREONAM ACCORDING TO CLSI M100-S15 VOL.25 N01 JAN 2005. CRITICAL RESULT CALLED TO, READ BACK BY AND VERIFIED WITH: DANIEL JACOBS,RN 11/11/2014 0909 JRS.    Report Status 11/11/2014 FINAL  Final   Organism ID, Bacteria ESCHERICHIA COLI  Final      Susceptibility   Escherichia coli - MIC*    AMPICILLIN >=32 RESISTANT Resistant     CEFAZOLIN >=  64 RESISTANT Resistant     CEFTRIAXONE >=64 RESISTANT Resistant     CIPROFLOXACIN >=4 RESISTANT Resistant     GENTAMICIN <=1 SENSITIVE Sensitive     IMIPENEM <=0.25 SENSITIVE Sensitive     NITROFURANTOIN <=16 SENSITIVE Sensitive     TRIMETH/SULFA >=320 RESISTANT Resistant     Extended ESBL POSITIVE Resistant     PIP/TAZO Value in next row Sensitive      SENSITIVE8    LEVOFLOXACIN Value in next row Resistant      RESISTANT>=8    * >=100,000 COLONIES/mL ESCHERICHIA COLI  MRSA PCR Screening     Status: None   Collection Time: 11/09/14  6:24 PM  Result Value Ref Range Status   MRSA by PCR NEGATIVE NEGATIVE Final    Comment:        The GeneXpert MRSA Assay (FDA approved for NASAL specimens only), is one component of a comprehensive MRSA colonization surveillance program. It is not intended to diagnose MRSA infection nor to guide or monitor  treatment for MRSA infections.   MRSA PCR Screening     Status: None   Collection Time: 11/21/14  2:04 PM  Result Value Ref Range Status   MRSA by PCR NEGATIVE NEGATIVE Final    Comment:        The GeneXpert MRSA Assay (FDA approved for NASAL specimens only), is one component of a comprehensive MRSA colonization surveillance program. It is not intended to diagnose MRSA infection nor to guide or monitor treatment for MRSA infections.     Coagulation Studies: No results for input(s): LABPROT, INR in the last 72 hours.  Urinalysis: No results for input(s): COLORURINE, LABSPEC, PHURINE, GLUCOSEU, HGBUR, BILIRUBINUR, KETONESUR, PROTEINUR, UROBILINOGEN, NITRITE, LEUKOCYTESUR in the last 72 hours.  Invalid input(s): APPERANCEUR    Imaging: Dg Chest 1 View  11/22/2014   CLINICAL DATA:  Hypoxia  EXAM: CHEST 1 VIEW  COMPARISON:  11/15/2014  FINDINGS: Prior CABG. Cardiomegaly with vascular congestion and bilateral airspace opacities in the perihilar and lower lobe regions, most compatible with edema/ CHF. Suspect small layering effusions.  IMPRESSION: Mild to moderate CHF.  Suspect small layering effusions.   Electronically Signed   By: Charlett Nose M.D.   On: 11/22/2014 08:35     Medications:   . DOBUTamine 2.5 mcg/kg/min (11/22/14 0000)  . DOPamine 2.5 mcg/kg/min (11/21/14 2000)  . furosemide (LASIX) infusion 4 mg/hr (11/22/14 0000)   . acidophilus  1 capsule Oral BID  . albumin human  12.5 g Intravenous BID  . amiodarone  200 mg Oral Daily  . antiseptic oral rinse  7 mL Mouth Rinse BID  . apixaban  2.5 mg Oral BID  . atorvastatin  40 mg Oral q1800  . docusate sodium  100 mg Oral BID  . feeding supplement (ENSURE ENLIVE)  237 mL Oral TID WC  . gabapentin  300 mg Oral QHS  . insulin aspart  0-5 Units Subcutaneous QHS  . insulin aspart  0-9 Units Subcutaneous TID WC  . insulin glargine  22 Units Subcutaneous QHS  . levothyroxine  100 mcg Oral QAC breakfast  . lubiprostone   24 mcg Oral BID WC  . nystatin cream   Topical BID  . pantoprazole  40 mg Oral Daily  . sodium chloride  3 mL Intravenous Q12H   acetaminophen **OR** acetaminophen, albuterol, alum & mag hydroxide-simeth, HYDROcodone-acetaminophen, nitroGLYCERIN, ondansetron **OR** ondansetron (ZOFRAN) IV, polyethylene glycol, promethazine, sodium chloride  Assessment/ Plan:  81 y.o. female with a PMHX of chronic  systolic congestive heart failure, type 2 diabetes, hypertension, atrial fibrillation, DJD, COPD, coronary disease/CABG 1988, catheter 2015, recurrent UTIs, chronic kidney disease, who was admitted to [facility] on 11/09/2014 for evaluation of elevated creatinine above her baseline and increased swelling.   1. Acute renal failure on chronic kidney disease stage 4  . Patient's baseline creatinine is 1.66/GFR of 28. CKD is likely secondary to atherosclerosis and hypertension. Acute worsening in creatinine is due to overdiuresis with metabolic alkalosis. - check renal function panel.    2. Hypertension and Anasarca. From systolic congestive heart failure acute exacerbation. - Continue furosemide gtt  /hr - IV albumin given without improvement.  - Low salt diet and fluid restriction.  - Appreciate cardiology input. Dobutamine and dopamine.     LOS: 13 Rahim Astorga 10/5/20169:16 AM

## 2014-11-23 LAB — BASIC METABOLIC PANEL
Anion gap: 7 (ref 5–15)
BUN: 62 mg/dL — AB (ref 6–20)
CALCIUM: 8.3 mg/dL — AB (ref 8.9–10.3)
CHLORIDE: 95 mmol/L — AB (ref 101–111)
CO2: 36 mmol/L — AB (ref 22–32)
CREATININE: 1.65 mg/dL — AB (ref 0.44–1.00)
GFR calc non Af Amer: 28 mL/min — ABNORMAL LOW (ref >60)
GFR, EST AFRICAN AMERICAN: 33 mL/min — AB (ref >60)
Glucose, Bld: 72 mg/dL (ref 65–99)
Potassium: 4.2 mmol/L (ref 3.5–5.1)
SODIUM: 138 mmol/L (ref 135–145)

## 2014-11-23 LAB — GLUCOSE, CAPILLARY
GLUCOSE-CAPILLARY: 56 mg/dL — AB (ref 65–99)
GLUCOSE-CAPILLARY: 76 mg/dL (ref 65–99)
GLUCOSE-CAPILLARY: 180 mg/dL — AB (ref 65–99)
GLUCOSE-CAPILLARY: 157 mg/dL — AB (ref 65–99)
Glucose-Capillary: 142 mg/dL — ABNORMAL HIGH (ref 65–99)

## 2014-11-23 MED ORDER — INSULIN GLARGINE 100 UNIT/ML ~~LOC~~ SOLN
18.0000 [IU] | Freq: Every day | SUBCUTANEOUS | Status: DC
  Administered 2014-11-23: 18 [IU] via SUBCUTANEOUS
  Filled 2014-11-23: qty 0.18

## 2014-11-23 MED ORDER — BOOST / RESOURCE BREEZE PO LIQD
1.0000 | Freq: Three times a day (TID) | ORAL | Status: DC
  Administered 2014-11-25 – 2014-11-28 (×9): 1 via ORAL

## 2014-11-23 MED ORDER — FUROSEMIDE 10 MG/ML IJ SOLN
40.0000 mg | Freq: Two times a day (BID) | INTRAMUSCULAR | Status: DC
  Administered 2014-11-23 – 2014-11-25 (×4): 40 mg via INTRAVENOUS
  Filled 2014-11-23 (×4): qty 4

## 2014-11-23 NOTE — Progress Notes (Signed)
Physical Therapy Treatment Patient Details Name: [name] MRN: [id number] DOB: 02-24-1931 Today's Date: 11/23/2014    History of Present Illness Pt with past medical history of chronic systolic congestive heart failure, DJD with patient being bedbound for last 6 months, type 2 diabetes, hypertension, A. fib on a liquid comes to the emergency room after she was found to have elevated creatinine on her routine lab work that was done at her nursing home.    PT Comments    Pt lethargic, but agreeable to bed exercises. Pt participates with assist as needed particularly on the R due to R hip pain. Pt refuses transfer to sit edge of bed due to fatigue. Offered repositioning; pt notes she was just turned prior to PT. Continue PT to progress strength/endurance and allow for progression to transfers up in bed.   Follow Up Recommendations  SNF     Equipment Recommendations       Recommendations for Other Services       Precautions / Restrictions Restrictions Weight Bearing Restrictions: No    Mobility  Bed Mobility               General bed mobility comments: not tested; pt refuses sitting edge of bed  Transfers                    Ambulation/Gait             General Gait Details: non ambulatory   Stairs            Wheelchair Mobility    Modified Rankin (Stroke Patients Only)       Balance                                    Cognition Arousal/Alertness: Lethargic Behavior During Therapy: WFL for tasks assessed/performed Overall Cognitive Status: Within Functional Limits for tasks assessed                      Exercises General Exercises - Lower Extremity Ankle Circles/Pumps: AROM;20 reps;Supine;Both Quad Sets: Strengthening;Both;20 reps;Supine Gluteal Sets: Strengthening;Both;20 reps;Supine Short Arc Quad: AROM;Both;20 reps;Supine Heel Slides: AAROM;Both;20 reps;Supine Hip ABduction/ADduction: AAROM;Both;20  reps;Supine    General Comments        Pertinent Vitals/Pain Pain Assessment: 0-10 Pain Score: 9  Pain Descriptors / Indicators: Aching Pain Intervention(s): Monitored during session    Home Living                      Prior Function            PT Goals (current goals can now be found in the care plan section)      Frequency  Min 2X/week    PT Plan Current plan remains appropriate    Co-evaluation             End of Session   Activity Tolerance: Patient limited by lethargy;Patient tolerated treatment well Patient left: in bed;with bed alarm set;with call bell/phone within reach     Time: 1434-1453 PT Time Calculation (min) (ACUTE ONLY): 19 min  Charges:  $Therapeutic Exercise: 8-22 mins                    G Codes:      Kristeen Miss 11/23/2014, 2:54 PM

## 2014-11-23 NOTE — Care Management Note (Signed)
Case Management Note  Patient Details  Name: [name] MRN: [id number] Date of Birth: 14-Aug-1931  Subjective/Objective:    Transferred to CCU due to hypotension. Cardiology consult. Placed on dopamine, dobutamine and lasix gtt.                Action/Plan:   Expected Discharge Date:                  Expected Discharge Plan:     In-House Referral:     Discharge planning Services     Post Acute Care Choice:    Choice offered to:     DME Arranged:    DME Agency:     HH Arranged:    HH Agency:     Status of Service:     Medicare Important Message Given:   (IM given) Date Medicare IM Given:    Medicare IM give by:    Date Additional Medicare IM Given:    Additional Medicare Important Message give by:     If discussed at Long Length of Stay Meetings, dates discussed:    Additional Comments:  Marily Memos, RN 11/23/2014, 8:45 AM

## 2014-11-23 NOTE — Consult Note (Signed)
Palliative Medicine Inpatient Consult Note   Name: [name] Date: 11/23/2014 MRN: [id number]  DOB: 04/25/1931  Referring Physician: Delfino Lovett, MD  Palliative Care consult requested for this 81 y.o. female for goals of medical therapy in patient who came to the ER after routine labs showed a creatinine of 2.68 (baseline 1.8) with potassium of 5.4 also.  She has the additional medical problems listed below. She was placed on a dobutamine drip and lasix and has improved with creatinine down to 1.65. She is not in CHF acutely but was admitted due to acute on chronic renal failure due at least in part to poor oral intake. She has a new finding, however, of a drop in EF down to 20-25%.     IMPRESSION: Acute renal failure ---improved with aggressive intervention in ICU Chronic renal failure stage IV with Estimated GFR 28 Cardiomyopathy with EF previously thought to be around 40%  ---but echo on 11/14/14 showed EF of 20 - 25%  ith diffuse hypkinesis and severe WMAs ---with CHRONIC SYSTOLIC CHF Paroxysmal AFib on Eliquis CAD and hx of MI and CABG Nocturnal Hypoxemia  --on home O2 at night normally Tricuspid Moderate Regurgitation Mild to Moderate Pulmonary HTN with PA peak pressure of 46 mm Hg Mild to Moderate Pleural Effusion on Left Hyperkalemia --resolved Elevated AST (liver function lab) Anemia--probably iron deficient with Hgb 8 -9 and MCV 80 Recent ESBL UTI --treated --also h/o yeast infections and recurrent UTIs Recent HCAP Peripheral edema despite diuresis  DM2 Hyperlipidemia PVD with Bilateral SFA stenting in hx DJD History of Lymphoma 2010 treated by Dr Sherrlyn Hock Hypothyroidism Paroxysmal Afib Decubiti (stage 2 sacrum)  TODAY'S DISCUSSIONS AND DECISIONS: Despite the patient being totally oriented and able to talk, she defers ALL conversation about code status and wishes to her granddaughter, who she says has POA / HCPOA.  She is informed about code status issues and  she says we should look at 'papers she signed' --presumably a Living Will --- which unfortunately was not brought here at this admission.  I have called [name], pts granddaughter,  I established that pt had 4 adopted children.  One is deceased.  One  daughter in [facility]. One son is in prison.  One son ([name]'s dad) is Clinical biochemist and only comes around when he needs money (per [place]).  None of the 3 living adult children are involved in pt's life or care decisioins.  Grandaughter, [name], an Charity fundraiser, has been living with pt since she was [age] yrs old (went to live with her grandparents when her parents divorced).    [name] will have a NEW HCPOA / LIVING WILL form completed.  I spoke with Chaplain and will put detailed order in regarding this.  Then [name] and I will meet on Monday around 1PM and discuss together with pt issues such as code status, feeding tubes, dialysis, and possibly even Hospice.  [name] feels pt should go back to SNF for more rehab (she had used about 15 days before coming in here this time) --so I will pass this on to Child psychotherapist.  A pt eval is appropriate given this wish.    REVIEW OF SYSTEMS:  Says she got no sleep and this makes her feel yuchy.    SPIRITUAL SUPPORT SYSTEM: Yes ---granddaughter.  SOCIAL HISTORY:  reports that she quit smoking about 26 years ago. Her smoking use included Cigarettes. She quit after 15 years of use. She has never used smokeless tobacco. She reports that  she does not drink alcohol or use illicit drugs.  LEGAL DOCUMENTS:  None found in computer data and none in paper chart.  HCPOA and LIVING WILL were both done, but the forms have been lost for a long time, per [place]. New ones to be completed.   CODE STATUS: Full code  PAST MEDICAL HISTORY: Past Medical History  Diagnosis Date  . DM2 (diabetes mellitus, type 2)     onset age 82 insulin dependent  . Hyperlipidemia   . COPD (chronic obstructive pulmonary disease)    . Peripheral vascular disease   . CAD (coronary artery disease)     a. MI 1988 s/p CABG in 1988, multiple PCI on SVGs; b. cath 2012: occluded native coronary arteries, patent LIMA to LAD (distal LAD dz), patent SVG-OM & occluded SVG-RCA which filled via left to right collats; c. cath 12/2013 patent LIMA-LAD & SVG-O. known occluded VG-RCA w/ L-R collats, no change since cath 2012  . Chronic systolic CHF (congestive heart failure)     a. 03/2012: EF 40-45%, mild lateral wall HK, mod inf HK, mod post wall HK, mild LVH, mild MR/TR   . Paroxysmal atrial fibrillation     a. CHADSVASc 7: yearly risk of CVA 9.6%;. b. on Eliquis 2.5 mg bid (age, SCr, borderline wt 62.9 Kg)   . HTN (hypertension)   . Yeast infection   . UTI (urinary tract infection)     Recurrent ESBL E.coli UTI  . Atherosclerosis of native artery of left lower extremity   . Myocardial infarction   . CKD (chronic kidney disease) stage 3, GFR 30-59 ml/min   . Hypoxia     on nocturnal o2    PAST SURGICAL HISTORY:  Past Surgical History  Procedure Laterality Date  . Abdominal hysterectomy    . Coronary artery bypass graft    . Appendectomy    . Cholecystectomy    . Ovary surgery    . Angioplasty / stenting femoral      Bilateral SFA stenting  . Femur surgery    . Cardiac catheterization      mc  . Cardiac catheterization  12/2013  . Right hip replacement    . Total knee arthroplasty      right knee    ALLERGIES:  is allergic to contrast media; tramadol; valium; iodine strong; and penicillins.  MEDICATIONS:  Current Facility-Administered Medications  Medication Dose Route Frequency Provider Last Rate Last Dose  . acetaminophen (TYLENOL) tablet 650 mg  650 mg Oral Q6H PRN Enedina Finner, MD   650 mg at 11/10/14 0650   Or  . acetaminophen (TYLENOL) suppository 650 mg  650 mg Rectal Q6H PRN Enedina Finner, MD      . acidophilus (RISAQUAD) capsule 1 capsule  1 capsule Oral BID Enedina Finner, MD   1 capsule at 11/23/14 1041  .  albumin human 25 % solution 12.5 g  12.5 g Intravenous BID Harmeet Singh, MD   12.5 g at 11/23/14 1051  . albuterol (PROVENTIL) (2.5 MG/3ML) 0.083% nebulizer solution 2.5 mg  2.5 mg Nebulization Q6H PRN Enedina Finner, MD   2.5 mg at 11/17/14 1720  . alum & mag hydroxide-simeth (MAALOX/MYLANTA) 200-200-20 MG/5ML suspension 30 mL  30 mL Oral Q6H PRN Enid Baas, MD   30 mL at 11/23/14 1348  . amiodarone (PACERONE) tablet 200 mg  200 mg Oral Daily Enedina Finner, MD   200 mg at 11/23/14 1041  . antiseptic oral rinse (CPC / CETYLPYRIDINIUM CHLORIDE 0.05%) solution  7 mL  7 mL Mouth Rinse BID Adrian Saran, MD   7 mL at 11/21/14 2200  . apixaban (ELIQUIS) tablet 2.5 mg  2.5 mg Oral BID Enedina Finner, MD   2.5 mg at 11/23/14 1041  . atorvastatin (LIPITOR) tablet 40 mg  40 mg Oral q1800 Enid Baas, MD   40 mg at 11/22/14 1742  . DOBUTamine (DOBUTREX) infusion 4000 mcg/mL  5 mcg/kg/min Intravenous Titrated Antonieta Iba, MD 3.2 mL/hr at 11/23/14 0600 2.504 mcg/kg/min at 11/23/14 0600  . docusate sodium (COLACE) capsule 100 mg  100 mg Oral BID Enedina Finner, MD   100 mg at 11/23/14 1041  . DOPamine (INTROPIN) 800 mg in dextrose 5 % 250 mL (3.2 mg/mL) infusion  2.5 mcg/kg/min Intravenous Titrated Antonieta Iba, MD   Stopped at 11/23/14 0505  . feeding supplement (BOOST / RESOURCE BREEZE) liquid 1 Container  1 Container Oral TID WC Vipul Shah, MD      . furosemide (LASIX) 250 mg in dextrose 5 % 250 mL (1 mg/mL) infusion  4 mg/hr Intravenous Continuous Harmeet Singh, MD 4 mL/hr at 11/23/14 0600 4 mg/hr at 11/23/14 0600  . gabapentin (NEURONTIN) capsule 300 mg  300 mg Oral QHS Mosetta Pigeon, MD   300 mg at 11/22/14 2145  . HYDROcodone-acetaminophen (NORCO/VICODIN) 5-325 MG per tablet 1-2 tablet  1-2 tablet Oral Q6H PRN Enid Baas, MD   1 tablet at 11/22/14 0009  . insulin aspart (novoLOG) injection 0-5 Units  0-5 Units Subcutaneous QHS Wyatt Haste, MD   Stopped at 11/22/14 2130  . insulin aspart  (novoLOG) injection 0-9 Units  0-9 Units Subcutaneous TID WC Wyatt Haste, MD   1 Units at 11/23/14 1221  . insulin glargine (LANTUS) injection 18 Units  18 Units Subcutaneous QHS Shane Crutch, MD      . levothyroxine (SYNTHROID, LEVOTHROID) tablet 100 mcg  100 mcg Oral QAC breakfast Enedina Finner, MD   100 mcg at 11/23/14 0801  . lubiprostone (AMITIZA) capsule 24 mcg  24 mcg Oral BID WC Enedina Finner, MD   24 mcg at 11/23/14 0758  . nitroGLYCERIN (NITROSTAT) SL tablet 0.4 mg  0.4 mg Sublingual Q5 min PRN Enedina Finner, MD   0.4 mg at 11/21/14 2146  . nystatin cream (MYCOSTATIN)   Topical BID Enid Baas, MD      . ondansetron (ZOFRAN) tablet 4 mg  4 mg Oral Q6H PRN Enedina Finner, MD       Or  . ondansetron (ZOFRAN) injection 4 mg  4 mg Intravenous Q6H PRN Enedina Finner, MD   4 mg at 11/21/14 1829  . pantoprazole (PROTONIX) EC tablet 40 mg  40 mg Oral Daily Adrian Saran, MD   40 mg at 11/23/14 1041  . polyethylene glycol (MIRALAX / GLYCOLAX) packet 17 g  17 g Oral Daily PRN Enedina Finner, MD   17 g at 11/16/14 1239  . promethazine (PHENERGAN) injection 12.5 mg  12.5 mg Intravenous Q6H PRN Adrian Saran, MD   12.5 mg at 11/19/14 0049  . sodium chloride 0.9 % injection 3 mL  3 mL Intravenous Q12H Enid Baas, MD   3 mL at 11/23/14 1051  . sodium chloride 0.9 % injection 3 mL  3 mL Intravenous PRN Enid Baas, MD        Vital Signs: BP 117/65 mmHg  Pulse 81  Temp(Src) 97.4 F (36.3 C) (Axillary)  Resp 15  Ht  (1.549 m)  Wt 83.8 kg (184 lb 11.9  oz)  BMI 34.93 kg/m2  SpO2 98% Filed Weights   11/21/14 0500 11/22/14 0500 11/23/14 0454  Weight: 85.231 kg (187 lb 14.4 oz) 83.4 kg (183 lb 13.8 oz) 83.8 kg (184 lb 11.9 oz)    Estimated body mass index is 34.93 kg/(m^2) as calculated from the following:   Height as of this encounter: 5\' 1"  (1.549 m).   Weight as of this encounter: 83.8 kg (184 lb 11.9 oz).  PERFORMANCE STATUS (ECOG) : 4 - Bedbound at this time  PHYSICAL EXAM: She  wakens easily and is oriented fully EOMI OP clear Neck w/o JVD or TM Heart rrr 2/6 m Lungs reduced BS bases Abd soft and NT No cyanosis or mottling   LABS: CBC:    Component Value Date/Time   WBC 5.7 11/22/2014 0518   WBC 4.4 07/10/2014 1558   WBC 6.5 06/17/2014 0412   HGB 8.9* 11/22/2014 0518   HGB 9.5* 06/17/2014 0412   HCT 28.1* 11/22/2014 0518   HCT 27.6* 07/10/2014 1558   HCT 28.6* 06/17/2014 0412   PLT 223 11/22/2014 0518   PLT 228 06/17/2014 0412   MCV 79.9* 11/22/2014 0518   MCV 94 06/17/2014 0412   NEUTROABS 3.9 11/09/2014 1434   NEUTROABS 5.5 06/17/2014 0412   NEUTROABS 2.5 01/17/2014 1647   LYMPHSABS 0.7* 11/09/2014 1434   LYMPHSABS 0.6* 06/17/2014 0412   LYMPHSABS 2.0 01/17/2014 1647   MONOABS 0.6 11/09/2014 1434   MONOABS 0.4 06/17/2014 0412   EOSABS 0.2 11/09/2014 1434   EOSABS 0.0 06/17/2014 0412   EOSABS 0.4 01/17/2014 1647   BASOSABS 0.1 11/09/2014 1434   BASOSABS 0.0 06/17/2014 0412   BASOSABS 0.0 01/17/2014 1647   Comprehensive Metabolic Panel:    Component Value Date/Time   NA 138 11/23/2014 0454   NA 141 08/03/2014 1413   NA 140 06/16/2014 0215   K 4.2 11/23/2014 0454   K 4.3 06/16/2014 0215   CL 95* 11/23/2014 0454   CL 104 06/16/2014 0215   CO2 36* 11/23/2014 0454   CO2 25 06/16/2014 0215   BUN 62* 11/23/2014 0454   BUN 36* 08/03/2014 1413   BUN 40* 06/16/2014 0215   CREATININE 1.65* 11/23/2014 0454   CREATININE 1.42* 06/16/2014 0215   GLUCOSE 72 11/23/2014 0454   GLUCOSE 155* 08/03/2014 1413   GLUCOSE 278* 06/16/2014 0215   CALCIUM 8.3* 11/23/2014 0454   CALCIUM 8.5* 06/16/2014 0215   AST 61* 11/19/2014 0448   AST 28 06/14/2014 1950   ALT 20 11/19/2014 0448   ALT 13* 06/14/2014 1950   ALKPHOS 80 11/19/2014 0448   ALKPHOS 62 06/14/2014 1950   BILITOT 0.9 11/19/2014 0448   BILITOT 0.3 06/14/2014 1950   PROT 5.7* 11/19/2014 0448   PROT 7.3 06/14/2014 1950   ALBUMIN 3.1* 11/19/2014 0448   ALBUMIN 3.8 06/14/2014 1950      More than 50% of the visit was spent in counseling/coordination of care: Yes  Time Spent: 80 minutes

## 2014-11-23 NOTE — Progress Notes (Signed)
Subjective:   Continues on furosemide 4mg  /hr gtt UOP 1220 Dobutamine gtt. Off dopamine gtt  Objective:  Vital signs in last 24 hours:  Temp:  [97.3 F (36.3 C)-97.7 F (36.5 C)] 97.4 F (36.3 C) (10/06 0815) Pulse Rate:  [76-89] 81 (10/06 0900) Resp:  [9-30] 13 (10/06 0900) BP: (105-126)/(50-65) 108/50 mmHg (10/06 0900) SpO2:  [96 %-100 %] 100 % (10/06 0900) Weight:  [83.8 kg (184 lb 11.9 oz)] 83.8 kg (184 lb 11.9 oz) (10/06 0454)  Weight change: 0.4 kg (14.1 oz) Filed Weights   11/21/14 0500 11/22/14 0500 11/23/14 0454  Weight: 85.231 kg (187 lb 14.4 oz) 83.4 kg (183 lb 13.8 oz) 83.8 kg (184 lb 11.9 oz)    Intake/Output:    Intake/Output Summary (Last 24 hours) at 11/23/14 0940 Last data filed at 11/23/14 0744  Gross per 24 hour  Intake  964.8 ml  Output   1275 ml  Net -310.2 ml     Physical Exam: General:  elderly, frail, laying in the bed   HEENT  moist mucous membranes, anicteric sclera   Neck  supple, no masses   Pulm/lungs  mild basilar crackles, normal effort , [place] oxygen 4 Litres  CVS/Heart  regular rhythm   Abdomen:   soft, nontender, nondistended   Extremities:  1+ dependent edema   Neurologic:  alert, oriented, speech normal   Skin:  no acute rashes, skin breakdown over shins      GU  Foley in place     Basic Metabolic Panel:   Recent Labs Lab 11/19/14 0448 11/20/14 1304 11/21/14 0541 11/22/14 0926 11/23/14 0454  NA 137 131* 136 136 138  K 5.3* 4.9 4.9 4.6 4.2  CL 95* 90* 96* 95* 95*  CO2 34* 31 34* 34* 36*  GLUCOSE 107* 137* 101* 168* 72  BUN 60* 67* 68* 68* 62*  CREATININE 1.76* 2.02* 2.09* 1.92* 1.65*  CALCIUM 8.0* 7.9* 8.1* 8.2* 8.3*     CBC:  Recent Labs Lab 11/19/14 0448 11/22/14 0518  WBC 6.2 5.7  HGB 9.3* 8.9*  HCT 28.8* 28.1*  MCV 80.0 79.9*  PLT 279 223      Microbiology:  Recent Results (from the past 720 hour(s))  Blood Culture (routine x 2)     Status: None   Collection Time: 11/09/14  2:34 PM  Result  Value Ref Range Status   Specimen Description BLOOD RIGHT ARM  Final   Special Requests BOTTLES DRAWN AEROBIC AND ANAEROBIC 3CC  Final   Culture NO GROWTH 5 DAYS  Final   Report Status 11/14/2014 FINAL  Final  Urine culture     Status: None   Collection Time: 11/09/14  2:34 PM  Result Value Ref Range Status   Specimen Description URINE, CLEAN CATCH  Final   Special Requests NONE  Final   Culture   Final    >=100,000 COLONIES/mL ESCHERICHIA COLI ESBL-EXTENDED SPECTRUM BETA LACTAMASE-THE ORGANISM IS RESISTANT TO PENICILLINS, CEPHALOSPORINS AND AZTREONAM ACCORDING TO CLSI M100-S15 VOL.25 N01 JAN 2005. CRITICAL RESULT CALLED TO, READ BACK BY AND VERIFIED WITH: DANIEL JACOBS,RN 11/11/2014 0909 JRS.    Report Status 11/11/2014 FINAL  Final   Organism ID, Bacteria ESCHERICHIA COLI  Final      Susceptibility   Escherichia coli - MIC*    AMPICILLIN >=32 RESISTANT Resistant     CEFAZOLIN >=64 RESISTANT Resistant     CEFTRIAXONE >=64 RESISTANT Resistant     CIPROFLOXACIN >=4 RESISTANT Resistant     GENTAMICIN <=1  SENSITIVE Sensitive     IMIPENEM <=0.25 SENSITIVE Sensitive     NITROFURANTOIN <=16 SENSITIVE Sensitive     TRIMETH/SULFA >=320 RESISTANT Resistant     Extended ESBL POSITIVE Resistant     PIP/TAZO Value in next row Sensitive      SENSITIVE8    LEVOFLOXACIN Value in next row Resistant      RESISTANT>=8    * >=100,000 COLONIES/mL ESCHERICHIA COLI  MRSA PCR Screening     Status: None   Collection Time: 11/09/14  6:24 PM  Result Value Ref Range Status   MRSA by PCR NEGATIVE NEGATIVE Final    Comment:        The GeneXpert MRSA Assay (FDA approved for NASAL specimens only), is one component of a comprehensive MRSA colonization surveillance program. It is not intended to diagnose MRSA infection nor to guide or monitor treatment for MRSA infections.   MRSA PCR Screening     Status: None   Collection Time: 11/21/14  2:04 PM  Result Value Ref Range Status   MRSA by PCR  NEGATIVE NEGATIVE Final    Comment:        The GeneXpert MRSA Assay (FDA approved for NASAL specimens only), is one component of a comprehensive MRSA colonization surveillance program. It is not intended to diagnose MRSA infection nor to guide or monitor treatment for MRSA infections.     Coagulation Studies: No results for input(s): LABPROT, INR in the last 72 hours.  Urinalysis: No results for input(s): COLORURINE, LABSPEC, PHURINE, GLUCOSEU, HGBUR, BILIRUBINUR, KETONESUR, PROTEINUR, UROBILINOGEN, NITRITE, LEUKOCYTESUR in the last 72 hours.  Invalid input(s): APPERANCEUR    Imaging: Dg Chest 1 View  11/22/2014   CLINICAL DATA:  Hypoxia  EXAM: CHEST 1 VIEW  COMPARISON:  11/15/2014  FINDINGS: Prior CABG. Cardiomegaly with vascular congestion and bilateral airspace opacities in the perihilar and lower lobe regions, most compatible with edema/ CHF. Suspect small layering effusions.  IMPRESSION: Mild to moderate CHF.  Suspect small layering effusions.   Electronically Signed   By: Charlett Nose M.D.   On: 11/22/2014 08:35     Medications:   . DOBUTamine 2.504 mcg/kg/min (11/23/14 0600)  . DOPamine Stopped (11/23/14 0505)  . furosemide (LASIX) infusion 4 mg/hr (11/23/14 0600)   . acidophilus  1 capsule Oral BID  . albumin human  12.5 g Intravenous BID  . amiodarone  200 mg Oral Daily  . antiseptic oral rinse  7 mL Mouth Rinse BID  . apixaban  2.5 mg Oral BID  . atorvastatin  40 mg Oral q1800  . docusate sodium  100 mg Oral BID  . feeding supplement (ENSURE ENLIVE)  237 mL Oral TID WC  . gabapentin  300 mg Oral QHS  . insulin aspart  0-5 Units Subcutaneous QHS  . insulin aspart  0-9 Units Subcutaneous TID WC  . insulin glargine  22 Units Subcutaneous QHS  . levothyroxine  100 mcg Oral QAC breakfast  . lubiprostone  24 mcg Oral BID WC  . nystatin cream   Topical BID  . pantoprazole  40 mg Oral Daily  . sodium chloride  3 mL Intravenous Q12H   acetaminophen **OR**  acetaminophen, albuterol, alum & mag hydroxide-simeth, HYDROcodone-acetaminophen, nitroGLYCERIN, ondansetron **OR** ondansetron (ZOFRAN) IV, polyethylene glycol, promethazine, sodium chloride  Assessment/ Plan:  81 y.o. female with a PMHX of chronic systolic congestive heart failure, type 2 diabetes, hypertension, atrial fibrillation, DJD, COPD, coronary disease/CABG 1988, catheter 2015, recurrent UTIs, chronic kidney disease, who was admitted to  [facility] on 11/09/2014 for evaluation of elevated creatinine above her baseline and increased swelling.   1. Acute renal failure on chronic kidney disease stage 4  . Patient's baseline creatinine is 1.66/GFR of 28. CKD is likely secondary to atherosclerosis and hypertension. Acute renal failure from acute cardiorenal syndrome. Improving with dobutamine and furosemide - Continue furosemide gtt for another 24 hours - Dobutamine and dopamine as per cardiology.   2. Hypertension and Anasarca/edema. From systolic congestive heart failure acute exacerbation. - Continue furosemide gtt  /hr - IV albumin given without improvement.  - Low salt diet and fluid restriction.  - Appreciate cardiology input. Dobutamine and dopamine.     LOS: 14 [name] 10/6/20169:40 AM

## 2014-11-23 NOTE — Progress Notes (Signed)
Spoke with Dr. Sherryll Burger on the phone about change in patient's code status order. MD confirmed that meant for the room next door. RN confirmed Full Code for [name].

## 2014-11-23 NOTE — Progress Notes (Signed)
[facility] [place] at [facility]   PATIENT NAME: [name]    MR#:  [id number]  DATE OF BIRTH:  [birth year]  SUBJECTIVE:  Improving,. Good UOP, creat improving 1.76-->1.65 on dobutamine gtt. On lasix drip. Neg 4.1 liters   REVIEW OF SYSTEMS:    Review of Systems  Constitutional: Negative for fever, chills and malaise/fatigue.  HENT: Negative for sore throat.   Eyes: Negative for blurred vision.  Respiratory: Negative for cough, hemoptysis, shortness of breath and wheezing.   Cardiovascular: Positive for leg swelling. Negative for chest pain and palpitations.  Gastrointestinal: Negative for nausea, vomiting, abdominal pain, diarrhea and blood in stool.  Genitourinary: Negative for dysuria.  Musculoskeletal: Negative for back pain.  Neurological: Negative for dizziness, tremors and headaches.  Endo/Heme/Allergies: Does not bruise/bleed easily.    Tolerating Diet: Yes   DRUG ALLERGIES:   Allergies  Allergen Reactions  . Contrast Media [Iodinated Diagnostic Agents] Shortness Of Breath  . Tramadol Nausea Only  . Valium [Diazepam] Other (See Comments)    Reaction:  Elevated heart rate  . Iodine Strong [Iodine] Rash  . Penicillins Rash    VITALS:  Blood pressure 117/65, pulse 81, temperature 97.4 F (36.3 C), temperature source Axillary, resp. rate 15, height  (1.549 m), weight 83.8 kg (184 lb 11.9 oz), SpO2 98 %.  PHYSICAL EXAMINATION:   Physical Exam  Constitutional: She is oriented to person, place, and time and well-developed, well-nourished, and in no distress. No distress.  HENT:  Head: Normocephalic.  Eyes: No scleral icterus.  Neck: Normal range of motion. Neck supple. No JVD present. No tracheal deviation present.  Cardiovascular: Normal rate, regular rhythm and normal heart sounds.  Exam reveals no gallop and no friction rub.   No murmur heard. Pulmonary/Chest: Effort normal and breath sounds normal. No respiratory  distress. She has no wheezes. She has no rales. She exhibits no tenderness.  Abdominal: Soft. Bowel sounds are normal. She exhibits no distension and no mass. There is no tenderness. There is no rebound and no guarding.  Edema on abdominal wall and pelvic area present.  Musculoskeletal: Normal range of motion. She exhibits edema.  Neurological: She is alert and oriented to person, place, and time.  Skin: Skin is warm. No rash noted. No erythema.  Breakdowns on both legs.  Psychiatric: Affect and judgment normal.    LABORATORY PANEL:   CBC  Recent Labs Lab 11/22/14 0518  WBC 5.7  HGB 8.9*  HCT 28.1*  PLT 223   ------------------------------------------------------------------------------------------------------------------  Chemistries   Recent Labs Lab 11/19/14 0448  11/23/14 0454  NA 137  < > 138  K 5.3*  < > 4.2  CL 95*  < > 95*  CO2 34*  < > 36*  GLUCOSE 107*  < > 72  BUN 60*  < > 62*  CREATININE 1.76*  < > 1.65*  CALCIUM 8.0*  < > 8.3*  AST 61*  --   --   ALT 20  --   --   ALKPHOS 80  --   --   BILITOT 0.9  --   --   < > = values in this interval not displayed. ------------------------------------------------------------------------------------------------------------------  Cardiac Enzymes No results for input(s): TROPONINI in the last 168 hours. ------------------------------------------------------------------------------------------------------------------  RADIOLOGY:  Dg Chest 1 View  11/22/2014   CLINICAL DATA:  Hypoxia  EXAM: CHEST 1 VIEW  COMPARISON:  11/15/2014  FINDINGS: Prior CABG. Cardiomegaly with vascular congestion and bilateral airspace opacities in  the perihilar and lower lobe regions, most compatible with edema/ CHF. Suspect small layering effusions.  IMPRESSION: Mild to moderate CHF.  Suspect small layering effusions.   Electronically Signed   By: Charlett Nose M.D.   On: 11/22/2014 08:35   ASSESSMENT AND PLAN:   81 year old female with a  history of chronic systolic heart failure, DJD with functional quadriplegia, type 2 diabetes and hypertension who presented with elevation in her creatinine and edema.  1. Acute on chronic renal failure:  Patient's creatinine has much improved 1.7->1.6  currently on Lasix drip for > 1 week. Nephrology is following. Renal ultrasound showed medical renal disease. Continue to avoid nephrotoxic agents. decreased gabapentin dose. Her acute worsening in creatinine is likely secondary to recent UTI from ESBL leading to volume issues and ATN. She also have poor oral intake,  dietary consult to help with supplements.    Due to very Low EF- there may be component of cardio renal syndrome. On dobutamine drip  2. ESBL urinary tract infection:  on Invanz which was started on September 24. She has PICC line and will need 10 days of antibiotics. Up till 11/21/14- stopped now.  3. Acute on chronic systolic heart failure with anasarca: Patient's currently on Lasix drip. Her echocardiogram showed EF of 20% with hypokinesis of several areas.  continue Lasix drip did gave decreased urine and worsening renal func- so called Cardio consult- likely cardiorenal syndrome- appreciated help.  4. CAD status post CABG: Patient is not on ACE inhibitor/ARB due to hypotension and acute renal failure. Blood pressure is low normal in the setting of using Lasix drip.  5. Atrial fibrillation:  heart rate is controlled. Patient will continue on Eliquis and amiodarone.  6. Diabetes type 2: Continue Lantus and sliding scale insulin.  7. Hypothyroidism   On Levothyroxin, check TSH.  She lives at [facility]. Still have significant edema.  Management plans discussed with the patient and she is in agreement.  CODE STATUS: Full  TOTAL TIME TAKING CARE OF THIS PATIENT: 35 minutes.  i discussed the condition and co-ordinated plan with Nephrology and cardiology.    POSSIBLE D/C 1-2 days, DEPENDING ON CLINICAL  CONDITION.   [facility], Fortunato Nordin M.D on 11/23/2014 at 5:14 PM  Between 7am to 6pm - Pager - [phone] After 6pm go to [email or web address] - password [email or web address] [facility]  [place]  Office  [phone]  CC: Primary care physician; Fidel Levy, MD

## 2014-11-23 NOTE — Progress Notes (Signed)
Spoke with Maralyn Sago, RN and pt not drinking ensure enlive, did not want to try magic cup.   Willing to try boost breeze supplement TID for added nutrition.  Will follow-up tomorrow with pt.   Alizay Bronkema B. Freida Busman, RD, LDN [phone] (pager)

## 2014-11-23 NOTE — Progress Notes (Signed)
Patient: [name] / Admit Date: 11/09/2014 / Date of Encounter: 11/23/2014, 11:12 AM   Subjective: Continues to improve. SCr continues to improve from 1.92-->1.65 on dobutamine gtt. Dopamine gtt was discontinued at 5 AM on 10/6. She has diuresed 3868 for the admission and net output of 105 for the past 24 hours. She remains on Lasix gtt at this time per nephrology.   Review of Systems: Review of Systems  Constitutional: Positive for malaise/fatigue. Negative for fever, weight loss and diaphoresis.  HENT: Negative for congestion.   Eyes: Negative for discharge and redness.  Respiratory: Positive for cough and shortness of breath. Negative for hemoptysis, sputum production and wheezing.   Cardiovascular: Positive for leg swelling. Negative for chest pain, palpitations, orthopnea, claudication and PND.  Gastrointestinal: Negative for nausea and vomiting.  Neurological: Positive for weakness. Negative for dizziness, tingling, tremors, sensory change, speech change, focal weakness and loss of consciousness.  Psychiatric/Behavioral: The patient is not nervous/anxious.     Objective: Telemetry: NSR, 80's, PVCs Physical Exam: Blood pressure 108/50, pulse 81, temperature 97.4 F (36.3 C), temperature source Axillary, resp. rate 13, height  (1.549 m), weight 184 lb 11.9 oz (83.8 kg), SpO2 100 %. Body mass index is 34.93 kg/(m^2). General: Well developed, well nourished, in no acute distress. Head: Normocephalic, atraumatic, sclera non-icteric, no xanthomas, nares are without discharge. Neck: Negative for carotid bruits. Improving JVP. Lungs: Clear bilaterally to auscultation without wheezes, rales, or rhonchi. Breathing is unlabored. Heart: RRR S1 S2 without murmurs, rubs, or gallops.  Abdomen: Soft, non-tender, non-distended with normoactive bowel sounds. No rebound/guarding. Extremities: No clubbing or cyanosis. 2+ pitting edema LE. Distal pedal pulses are 2+ and equal  bilaterally. Neuro: Alert and oriented X 3. Moves all extremities spontaneously. Psych:  Responds to questions appropriately with a normal affect.   Intake/Output Summary (Last 24 hours) at 11/23/14 1112 Last data filed at 11/23/14 0900  Gross per 24 hour  Intake 1444.8 ml  Output   1275 ml  Net  169.8 ml    Inpatient Medications:  . acidophilus  1 capsule Oral BID  . albumin human  12.5 g Intravenous BID  . amiodarone  200 mg Oral Daily  . antiseptic oral rinse  7 mL Mouth Rinse BID  . apixaban  2.5 mg Oral BID  . atorvastatin  40 mg Oral q1800  . docusate sodium  100 mg Oral BID  . feeding supplement (ENSURE ENLIVE)  237 mL Oral TID WC  . gabapentin  300 mg Oral QHS  . insulin aspart  0-5 Units Subcutaneous QHS  . insulin aspart  0-9 Units Subcutaneous TID WC  . insulin glargine  18 Units Subcutaneous QHS  . levothyroxine  100 mcg Oral QAC breakfast  . lubiprostone  24 mcg Oral BID WC  . nystatin cream   Topical BID  . pantoprazole  40 mg Oral Daily  . sodium chloride  3 mL Intravenous Q12H   Infusions:  . DOBUTamine 2.504 mcg/kg/min (11/23/14 0600)  . DOPamine Stopped (11/23/14 0505)  . furosemide (LASIX) infusion 4 mg/hr (11/23/14 0600)    Labs:  Recent Labs  11/22/14 0926 11/23/14 0454  NA 136 138  K 4.6 4.2  CL 95* 95*  CO2 34* 36*  GLUCOSE 168* 72  BUN 68* 62*  CREATININE 1.92* 1.65*  CALCIUM 8.2* 8.3*   No results for input(s): AST, ALT, ALKPHOS, BILITOT, PROT, ALBUMIN in the last 72 hours.  Recent Labs  11/22/14 0518  WBC  5.7  HGB 8.9*  HCT 28.1*  MCV 79.9*  PLT 223   No results for input(s): CKTOTAL, CKMB, TROPONINI in the last 72 hours. Invalid input(s): POCBNP No results for input(s): HGBA1C in the last 72 hours.   Weights: Filed Weights   11/21/14 0500 11/22/14 0500 11/23/14 0454  Weight: 187 lb 14.4 oz (85.231 kg) 183 lb 13.8 oz (83.4 kg) 184 lb 11.9 oz (83.8 kg)     Radiology/Studies:  Dg Chest 1 View  11/22/2014   CLINICAL  DATA:  Hypoxia  EXAM: CHEST 1 VIEW  COMPARISON:  11/15/2014  FINDINGS: Prior CABG. Cardiomegaly with vascular congestion and bilateral airspace opacities in the perihilar and lower lobe regions, most compatible with edema/ CHF. Suspect small layering effusions.  IMPRESSION: Mild to moderate CHF.  Suspect small layering effusions.   Electronically Signed   By: Charlett Nose M.D.   On: 11/22/2014 08:35   Dg Chest 1 View  11/12/2014   CLINICAL DATA:  PICC line placement  EXAM: CHEST 1 VIEW  COMPARISON:  11/09/2014  FINDINGS: Right PICC line is in place with the tip at the cavoatrial junction. Prior CABG. Cardiomegaly. Concern for bilateral airspace opacities, new since prior study. This could represent edema. No visible effusions.  IMPRESSION: Right PICC line tip at the cavoatrial junction.  Cardiomegaly. New diffuse bilateral airspace disease concerning for edema/CHF.   Electronically Signed   By: Charlett Nose M.D.   On: 11/12/2014 14:28   US Renal  11/11/2014   CLINICAL DATA:  Acute renal failure  EXAM: RENAL / URINARY TRACT ULTRASOUND COMPLETE  COMPARISON:  CT 10/20/2014  FINDINGS: Right Kidney:  Length: 10.8 cm. Slightly increased echotexture. No mass or hydronephrosis visualized.  Left Kidney:  Length: 8.9 cm. Increased echotexture. No mass or hydronephrosis visualized.  Bladder:  Appears normal for degree of bladder distention.  Ascites noted in the abdomen adjacent to the liver and spleen as seen on recent CT  IMPRESSION: Increased echotexture within the kidneys bilaterally suggesting chronic medical renal disease. No hydronephrosis.  Ascites.   Electronically Signed   By: Charlett Nose M.D.   On: 11/11/2014 09:48   Dg Chest Port 1 View  11/15/2014   CLINICAL DATA:  Cough and congestion, onset this evening.  EXAM: PORTABLE CHEST 1 VIEW  COMPARISON:  Chest radiograph 11/12/2014  FINDINGS: Tip of the right upper extremity PICC in the distal SVC. Patient is post median sternotomy. There is stable  cardiomegaly. Diminished bilateral airspace disease from prior exam, likely improved edema. Mild right infrahilar and left suprahilar atelectasis. Limited left basilar assessment scenario soft tissue attenuation. Chronic deformity about the left proximal humerus, unchanged.  IMPRESSION: 1. Findings suggest resolving pulmonary edema.  Stable cardiomegaly. 2. Tip of the right upper extremity PICC in the distal SVC.   Electronically Signed   By: Rubye Oaks M.D.   On: 11/15/2014 21:58   Dg Chest Port 1 View  11/09/2014   CLINICAL DATA:  Sepsis.  Hypoxia.  EXAM: PORTABLE CHEST 1 VIEW  COMPARISON:  October 18, 2014.  FINDINGS: Stable cardiomegaly. Status post coronary artery bypass graft. No pneumothorax is noted. Left basilar opacity is noted concerning for pneumonia or atelectasis with associated pleural effusion. Mild right basilar opacity is noted concerning for subsegmental atelectasis or mild pleural effusion. Bony thorax is unremarkable.  IMPRESSION: Left basilar opacity is noted concerning for pneumonia or atelectasis with associated pleural effusion. Mild right basilar opacity is noted concerning for subsegmental atelectasis or pleural effusion.  Electronically Signed   By: Lupita Raider, M.D.   On: 11/09/2014 14:58     Assessment and Plan  81 y.o. female with h/o CAD s/p CABG s/p PCI on SVGs, ischemic cardiomyopathy/chronic systolic CHF, PAF on Eliquis, PVD, COPD requiring nocturnal oxygen, CKD stage III, IDDM, HLD, and HTN who has been admitted recently to [facility] in mid June and mid July for ESBL UTI, HCAP and acute on chronic systolic CHF, mid August for ileus, and late August for HCAP again who presented to [facility] on 9/22 with from [facility] with acute on chronic CKD. Cardiology was consulted on 10/4 with acute on chronic systolic CHF in the setting of newly depressed EF of 20-25% by echo on 11/14/2014.  1. Acute on chronic systolic CHF: -Improving  -Leading to anasarca s/p albumin, LE edema,  and HTN -EF has worsened from previous 50% by echo in 2014 to now 20-25% this admission -Diuresis improved on dopamine and dobutamine gtts -Continue dobutamine throughout the day today, if ok with MD, and plan to wean by 10/7 -Dopamine was discontinued around 5 AM on 10/6 -Plan to change to afterload reduction on 10/7, followed by BB on 10/8 -Continue Lasix gtt per nephrology  -Restart Coreg 3.125 mg bid when able given her significant cardiomyopathy  -Renal function may preclude starting Entresto in the future, thus may need to use Imdur/hydralazine for heart failure   2. Acute on CKD stage IV: -Improving  -Likely in the setting of HTN and atherosclerosis  -Acute worsening possibly 2/2 overdiuresis with metabolic alkalosis and recent infections  -IV albumin as above -Continue gtts as above  3. PAF: -Currently in sinus rhythm -Restart Coreg as above -Amiodarone 200 mg daily  -Eliquis 2.5 mg bid -CHADSVASc at least 7 (CHF, HTN, DM, vascular disease, age x 2, female)  4. CAD s/p CABG s/p PCI as above: -No angina -Would benefit from ischemic work up at later date given her newly found depressed EF -On Eliquis in place of aspirin  -Coreg as above -Lipitor 40 mg  5. DM2: -SSI per IM   Signed, Eula Listen, PA-C Pager: [phone] 11/23/2014, 11:12 AM

## 2014-11-24 DIAGNOSIS — I5022 Chronic systolic (congestive) heart failure: Secondary | ICD-10-CM

## 2014-11-24 DIAGNOSIS — I129 Hypertensive chronic kidney disease with stage 1 through stage 4 chronic kidney disease, or unspecified chronic kidney disease: Secondary | ICD-10-CM

## 2014-11-24 DIAGNOSIS — Z515 Encounter for palliative care: Secondary | ICD-10-CM

## 2014-11-24 DIAGNOSIS — E876 Hypokalemia: Secondary | ICD-10-CM

## 2014-11-24 DIAGNOSIS — I272 Other secondary pulmonary hypertension: Secondary | ICD-10-CM

## 2014-11-24 DIAGNOSIS — Z794 Long term (current) use of insulin: Secondary | ICD-10-CM

## 2014-11-24 DIAGNOSIS — I071 Rheumatic tricuspid insufficiency: Secondary | ICD-10-CM

## 2014-11-24 DIAGNOSIS — G4736 Sleep related hypoventilation in conditions classified elsewhere: Secondary | ICD-10-CM

## 2014-11-24 DIAGNOSIS — E039 Hypothyroidism, unspecified: Secondary | ICD-10-CM

## 2014-11-24 DIAGNOSIS — N184 Chronic kidney disease, stage 4 (severe): Secondary | ICD-10-CM

## 2014-11-24 DIAGNOSIS — Z79899 Other long term (current) drug therapy: Secondary | ICD-10-CM

## 2014-11-24 DIAGNOSIS — I252 Old myocardial infarction: Secondary | ICD-10-CM

## 2014-11-24 DIAGNOSIS — Z87891 Personal history of nicotine dependence: Secondary | ICD-10-CM

## 2014-11-24 DIAGNOSIS — Z8579 Personal history of other malignant neoplasms of lymphoid, hematopoietic and related tissues: Secondary | ICD-10-CM | POA: Insufficient documentation

## 2014-11-24 DIAGNOSIS — I739 Peripheral vascular disease, unspecified: Secondary | ICD-10-CM

## 2014-11-24 DIAGNOSIS — L89152 Pressure ulcer of sacral region, stage 2: Secondary | ICD-10-CM

## 2014-11-24 DIAGNOSIS — M1990 Unspecified osteoarthritis, unspecified site: Secondary | ICD-10-CM

## 2014-11-24 DIAGNOSIS — E119 Type 2 diabetes mellitus without complications: Secondary | ICD-10-CM

## 2014-11-24 DIAGNOSIS — Z87440 Personal history of urinary (tract) infections: Secondary | ICD-10-CM

## 2014-11-24 DIAGNOSIS — Z951 Presence of aortocoronary bypass graft: Secondary | ICD-10-CM

## 2014-11-24 DIAGNOSIS — Z8572 Personal history of non-Hodgkin lymphomas: Secondary | ICD-10-CM | POA: Insufficient documentation

## 2014-11-24 DIAGNOSIS — J90 Pleural effusion, not elsewhere classified: Secondary | ICD-10-CM

## 2014-11-24 DIAGNOSIS — I4891 Unspecified atrial fibrillation: Secondary | ICD-10-CM

## 2014-11-24 DIAGNOSIS — R944 Abnormal results of kidney function studies: Secondary | ICD-10-CM

## 2014-11-24 DIAGNOSIS — R609 Edema, unspecified: Secondary | ICD-10-CM

## 2014-11-24 DIAGNOSIS — I429 Cardiomyopathy, unspecified: Secondary | ICD-10-CM

## 2014-11-24 LAB — GLUCOSE, CAPILLARY
GLUCOSE-CAPILLARY: 46 mg/dL — AB (ref 65–99)
GLUCOSE-CAPILLARY: 92 mg/dL (ref 65–99)
GLUCOSE-CAPILLARY: 115 mg/dL — AB (ref 65–99)
GLUCOSE-CAPILLARY: 102 mg/dL — AB (ref 65–99)
GLUCOSE-CAPILLARY: 87 mg/dL (ref 65–99)

## 2014-11-24 LAB — RENAL FUNCTION PANEL
ANION GAP: 7 (ref 5–15)
Albumin: 3.5 g/dL (ref 3.5–5.0)
BUN: 62 mg/dL — ABNORMAL HIGH (ref 6–20)
CHLORIDE: 95 mmol/L — AB (ref 101–111)
CO2: 37 mmol/L — AB (ref 22–32)
CREATININE: 1.65 mg/dL — AB (ref 0.44–1.00)
Calcium: 8.1 mg/dL — ABNORMAL LOW (ref 8.9–10.3)
GFR, EST AFRICAN AMERICAN: 33 mL/min — AB (ref >60)
GFR, EST NON AFRICAN AMERICAN: 28 mL/min — AB (ref >60)
Glucose, Bld: 56 mg/dL — ABNORMAL LOW (ref 65–99)
POTASSIUM: 4.1 mmol/L (ref 3.5–5.1)
Phosphorus: 3.9 mg/dL (ref 2.5–4.6)
Sodium: 139 mmol/L (ref 135–145)

## 2014-11-24 MED ORDER — ZOLPIDEM TARTRATE 5 MG PO TABS
5.0000 mg | ORAL_TABLET | Freq: Every evening | ORAL | Status: DC | PRN
  Administered 2014-11-26 – 2014-12-04 (×5): 5 mg via ORAL
  Filled 2014-11-24 (×5): qty 1

## 2014-11-24 MED ORDER — SIMETHICONE 80 MG PO CHEW
80.0000 mg | CHEWABLE_TABLET | Freq: Four times a day (QID) | ORAL | Status: DC | PRN
  Administered 2014-11-24: 80 mg via ORAL
  Filled 2014-11-24: qty 1

## 2014-11-24 NOTE — Clinical Social Work Note (Signed)
CSW noted new consult for placement. Patient is an existing resident at [facility] and can return to [facility] at discharge per previous CSW. York Spaniel MSW,LCSW [phone]

## 2014-11-24 NOTE — Progress Notes (Addendum)
Nutrition Follow-up     INTERVENTION:   Meals and Snacks: Cater to patient preferences; recommend liberalizing diet to regular as pt continues with poor appetite and po intake, episodes of hypoglycemia, reports sugar free items like the jello make her gag. Recommend small, frequent meals due to early satiety, poor appetite Medical Food Supplement Therapy: pt reports she did not like the Ensure so Boost Breeze was ordered; pt attempted to try this. At this time will try [employer] supplement Coordination of Care: pt reports issues with abdominal discomfort, gas, burping; pt has prn order for mylanta/maalox, pt has received today and has not helped; discussed with RNs, RNs paging MD to address  NUTRITION DIAGNOSIS:   Inadequate oral intake related to poor appetite as evidenced by per patient/family report. Continues but being addressed as   GOAL:   Patient will meet greater than or equal to 90% of their needs  MONITOR:    (Energy Intake, Anthropometrics, Electrolyte and Renal Profile, digestive system)  REASON FOR ASSESSMENT:   Diagnosis    ASSESSMENT:   Pt with anasarca secondary to CHF continues on Lasix drip. Nephrology following. Pt with EF 20%, off dobutamine and dopamine at present  Diet Order:  2g sodium, Carb Modified Diet  Energy Intake: recorded po intake of 100% at lunch today, although pt reports she only ate jello. Pt reports she ate an egg sandwich with 1 egg, 2 pieces of bread and mayo for breakfast. Recorded po intake 33% on average. Not drinking supplements, did not like Ensure or Mighty Shakes; Boost Breeze ordered yesterday which pt reports she tried to dilute with cold ice water but still does not like. Pt reports she can only eat a few bites, complains of early satiety. MD is aware of pt poor po intake  Digestive System: no nausea at present but has been an issue for pt; pt does complain of gas, burping, abdominal discomfort; +large BM today per  pt  Meds: acidophilus, maalox/mylanta, lasix, ss novolog  Urine Volume: UOP 1430 mL  Height:   Ht Readings from Last 1 Encounters:  11/21/14  (1.549 m)    Weight:   Wt Readings from Last 1 Encounters:  11/24/14 179 lb 3.7 oz (81.3 kg)    Filed Weights   11/22/14 0500 11/23/14 0454 11/24/14 0500  Weight: 183 lb 13.8 oz (83.4 kg) 184 lb 11.9 oz (83.8 kg) 179 lb 3.7 oz (81.3 kg)   BMI:  Body mass index is 33.88 kg/(m^2).  Estimated Nutritional Needs:   Kcal:  using IBW of 48kg, BEE: 882kcals, TEE: (IF 1.0-1.2)(AF 1.3) 1146-1490kcals  Protein:  using IBW of 48kg, 48-58g protein (1.0-1.2g/kg)  Fluid:  using IBW of 48kg, (25-87mL/kg) 1440-1661mL/kg  EDUCATION NEEDS:   No education needs identified at this time  HIGH Care Level  Romelle Starcher MS, RD, LDN [phone] Pager

## 2014-11-24 NOTE — Progress Notes (Signed)
Inpatient Diabetes Program Recommendations  AACE/ADA: New Consensus Statement on Inpatient Glycemic Control (2015)  Target Ranges:  Prepandial:   less than 140 mg/dL      Peak postprandial:   less than 180 mg/dL (1-2 hours)      Critically ill patients:  140 - 180 mg/dL   Review of Glycemic Control  Diabetes history: DM2 Outpatient Diabetes medications: Lantus 32 units QHS, Humalog 4 units TID with meals Current orders for Inpatient glycemic control: Lantus 22 units QHS, Novolog 0-9 units TID with meals, Novolog 0-5 units HS  Inpatient Diabetes Program Recommendations:    Please consider decreasing Lantus to 15 units QHS. Spoke to MD regarding my recommendation- he has decided to D/C Lantus for now. Will follow.   Susette Racer, RN, BA, MHA, CDE Diabetes Coordinator Inpatient Diabetes Program  [phone] (Team Pager) [phone] [facility] Office) 11/24/2014 8:12 AM

## 2014-11-24 NOTE — Progress Notes (Signed)
Subjective:   Off dopamine and dobutamine. UOP 1430 Furosemide  IV and albumin 12.5g q12  Objective:  Vital signs in last 24 hours:  Temp:  [96.3 F (35.7 C)-98 F (36.7 C)] 97.9 F (36.6 C) (10/07 0200) Pulse Rate:  [74-86] 76 (10/07 0800) Resp:  [11-19] 11 (10/07 0800) BP: (98-138)/(51-85) 111/61 mmHg (10/07 0800) SpO2:  [96 %-100 %] 100 % (10/07 0800) Weight:  [81.3 kg (179 lb 3.7 oz)] 81.3 kg (179 lb 3.7 oz) (10/07 0500)  Weight change: -2.5 kg (-5 lb 8.2 oz) Filed Weights   11/22/14 0500 11/23/14 0454 11/24/14 0500  Weight: 83.4 kg (183 lb 13.8 oz) 83.8 kg (184 lb 11.9 oz) 81.3 kg (179 lb 3.7 oz)    Intake/Output:    Intake/Output Summary (Last 24 hours) at 11/24/14 1610 Last data filed at 11/24/14 0800  Gross per 24 hour  Intake 910.63 ml  Output   1110 ml  Net -199.37 ml     Physical Exam: General:  elderly, frail, laying in the bed   HEENT  moist mucous membranes, anicteric sclera   Neck  supple, no masses   Pulm/lungs  mild basilar crackles, normal effort , [place] oxygen 4 Litres  CVS/Heart  regular rhythm   Abdomen:   soft, nontender, nondistended   Extremities: trace dependent edema   Neurologic:  alert, oriented, speech normal   Skin:  no acute rashes, skin breakdown over shins      GU  Foley in place     Basic Metabolic Panel:   Recent Labs Lab 11/20/14 1304 11/21/14 0541 11/22/14 0926 11/23/14 0454 11/24/14 0543  NA 131* 136 136 138 139  K 4.9 4.9 4.6 4.2 4.1  CL 90* 96* 95* 95* 95*  CO2 31 34* 34* 36* 37*  GLUCOSE 137* 101* 168* 72 56*  BUN 67* 68* 68* 62* 62*  CREATININE 2.02* 2.09* 1.92* 1.65* 1.65*  CALCIUM 7.9* 8.1* 8.2* 8.3* 8.1*  PHOS  --   --   --   --  3.9     CBC:  Recent Labs Lab 11/19/14 0448 11/22/14 0518  WBC 6.2 5.7  HGB 9.3* 8.9*  HCT 28.8* 28.1*  MCV 80.0 79.9*  PLT 279 223      Microbiology:  Recent Results (from the past 720 hour(s))  Blood Culture (routine x 2)     Status: None   Collection  Time: 11/09/14  2:34 PM  Result Value Ref Range Status   Specimen Description BLOOD RIGHT ARM  Final   Special Requests BOTTLES DRAWN AEROBIC AND ANAEROBIC 3CC  Final   Culture NO GROWTH 5 DAYS  Final   Report Status 11/14/2014 FINAL  Final  Urine culture     Status: None   Collection Time: 11/09/14  2:34 PM  Result Value Ref Range Status   Specimen Description URINE, CLEAN CATCH  Final   Special Requests NONE  Final   Culture   Final    >=100,000 COLONIES/mL ESCHERICHIA COLI ESBL-EXTENDED SPECTRUM BETA LACTAMASE-THE ORGANISM IS RESISTANT TO PENICILLINS, CEPHALOSPORINS AND AZTREONAM ACCORDING TO CLSI M100-S15 VOL.25 N01 JAN 2005. CRITICAL RESULT CALLED TO, READ BACK BY AND VERIFIED WITH: DANIEL JACOBS,RN 11/11/2014 0909 JRS.    Report Status 11/11/2014 FINAL  Final   Organism ID, Bacteria ESCHERICHIA COLI  Final      Susceptibility   Escherichia coli - MIC*    AMPICILLIN >=32 RESISTANT Resistant     CEFAZOLIN >=64 RESISTANT Resistant     CEFTRIAXONE >=64 RESISTANT  Resistant     CIPROFLOXACIN >=4 RESISTANT Resistant     GENTAMICIN <=1 SENSITIVE Sensitive     IMIPENEM <=0.25 SENSITIVE Sensitive     NITROFURANTOIN <=16 SENSITIVE Sensitive     TRIMETH/SULFA >=320 RESISTANT Resistant     Extended ESBL POSITIVE Resistant     PIP/TAZO Value in next row Sensitive      SENSITIVE8    LEVOFLOXACIN Value in next row Resistant      RESISTANT>=8    * >=100,000 COLONIES/mL ESCHERICHIA COLI  MRSA PCR Screening     Status: None   Collection Time: 11/09/14  6:24 PM  Result Value Ref Range Status   MRSA by PCR NEGATIVE NEGATIVE Final    Comment:        The GeneXpert MRSA Assay (FDA approved for NASAL specimens only), is one component of a comprehensive MRSA colonization surveillance program. It is not intended to diagnose MRSA infection nor to guide or monitor treatment for MRSA infections.   MRSA PCR Screening     Status: None   Collection Time: 11/21/14  2:04 PM  Result Value Ref  Range Status   MRSA by PCR NEGATIVE NEGATIVE Final    Comment:        The GeneXpert MRSA Assay (FDA approved for NASAL specimens only), is one component of a comprehensive MRSA colonization surveillance program. It is not intended to diagnose MRSA infection nor to guide or monitor treatment for MRSA infections.     Coagulation Studies: No results for input(s): LABPROT, INR in the last 72 hours.  Urinalysis: No results for input(s): COLORURINE, LABSPEC, PHURINE, GLUCOSEU, HGBUR, BILIRUBINUR, KETONESUR, PROTEINUR, UROBILINOGEN, NITRITE, LEUKOCYTESUR in the last 72 hours.  Invalid input(s): APPERANCEUR    Imaging: No results found.   Medications:   . DOBUTamine Stopped (11/24/14 0253)  . DOPamine Stopped (11/23/14 0505)   . acidophilus  1 capsule Oral BID  . albumin human  12.5 g Intravenous BID  . amiodarone  200 mg Oral Daily  . antiseptic oral rinse  7 mL Mouth Rinse BID  . apixaban  2.5 mg Oral BID  . atorvastatin  40 mg Oral q1800  . docusate sodium  100 mg Oral BID  . feeding supplement  1 Container Oral TID WC  . furosemide  40 mg Intravenous Q12H  . gabapentin  300 mg Oral QHS  . insulin aspart  0-5 Units Subcutaneous QHS  . insulin aspart  0-9 Units Subcutaneous TID WC  . levothyroxine  100 mcg Oral QAC breakfast  . lubiprostone  24 mcg Oral BID WC  . nystatin cream   Topical BID  . pantoprazole  40 mg Oral Daily  . sodium chloride  3 mL Intravenous Q12H   acetaminophen **OR** acetaminophen, albuterol, alum & mag hydroxide-simeth, HYDROcodone-acetaminophen, nitroGLYCERIN, ondansetron **OR** ondansetron (ZOFRAN) IV, polyethylene glycol, promethazine, sodium chloride  Assessment/ Plan:  81 y.o. female with a PMHX of chronic systolic congestive heart failure, type 2 diabetes, hypertension, atrial fibrillation, DJD, COPD, coronary disease/CABG 1988, catheter 2015, recurrent UTIs, chronic kidney disease, who was admitted to [facility] on 11/09/2014 for evaluation of  elevated creatinine above her baseline and increased swelling.   1. Acute renal failure on chronic kidney disease stage 4 . Patient's baseline creatinine is 1.66/GFR of 28. CKD is likely secondary to atherosclerosis and hypertension. Creatinine now at baseline.  Acute renal failure from acute cardiorenal syndrome. Improved with dobutamine, dopamine, albumin and furosemide. - Continue furosemide 40mg  IV q12 - Discontinue albumin. Now off dopamine  and dobutamine - Appreciate cardiology input. Case Discussed with Dr. Kirke Corin.  - Plan to remove foley catheter tomorrow.   2. Hypertension and Anasarca/edema. From systolic congestive heart failure acute exacerbation. - Continue furosemide as above.  - IV albumin to be stopped.  - Low salt diet and fluid restriction.     LOS: 15 Apolo Cutshaw 10/7/20169:29 AM

## 2014-11-24 NOTE — Progress Notes (Signed)
[facility] [place] at [facility]   PATIENT NAME: [name]    MR#:  [id number]  DATE OF BIRTH:  [birth year]  SUBJECTIVE:  Couldn't sleep all night. Off dobutamine and lasix drip today. Not feeling well. Neg 4 liters REVIEW OF SYSTEMS:    Review of Systems  Constitutional: Negative for fever, chills and malaise/fatigue.  HENT: Negative for sore throat.   Eyes: Negative for blurred vision.  Respiratory: Negative for cough, hemoptysis, shortness of breath and wheezing.   Cardiovascular: Positive for leg swelling. Negative for chest pain and palpitations.  Gastrointestinal: Negative for nausea, vomiting, abdominal pain, diarrhea and blood in stool.  Genitourinary: Negative for dysuria.  Musculoskeletal: Negative for back pain.  Neurological: Negative for dizziness, tremors and headaches.  Endo/Heme/Allergies: Does not bruise/bleed easily.    Tolerating Diet: Yes DRUG ALLERGIES:   Allergies  Allergen Reactions  . Contrast Media [Iodinated Diagnostic Agents] Shortness Of Breath  . Tramadol Nausea Only  . Valium [Diazepam] Other (See Comments)    Reaction:  Elevated heart rate  . Iodine Strong [Iodine] Rash  . Penicillins Rash   VITALS:  Blood pressure 120/65, pulse 78, temperature 95.9 F (35.5 C), temperature source Axillary, resp. rate 12, height  (1.549 m), weight 81.3 kg (179 lb 3.7 oz), SpO2 99 %. PHYSICAL EXAMINATION:   Physical Exam  Constitutional: She is oriented to person, place, and time and well-developed, well-nourished, and in no distress. No distress.  HENT:  Head: Normocephalic.  Eyes: No scleral icterus.  Neck: Normal range of motion. Neck supple. No JVD present. No tracheal deviation present.  Cardiovascular: Normal rate, regular rhythm and normal heart sounds.  Exam reveals no gallop and no friction rub.   No murmur heard. Pulmonary/Chest: Effort normal and breath sounds normal. No respiratory distress. She has no  wheezes. She has no rales. She exhibits no tenderness.  Abdominal: Soft. Bowel sounds are normal. She exhibits no distension and no mass. There is no tenderness. There is no rebound and no guarding.  Edema on abdominal wall and pelvic area present.  Musculoskeletal: Normal range of motion. She exhibits edema.  Neurological: She is alert and oriented to person, place, and time.  Skin: Skin is warm. No rash noted. No erythema.  Breakdowns on both legs.  Psychiatric: Affect and judgment normal.   LABORATORY PANEL:   CBC  Recent Labs Lab 11/22/14 0518  WBC 5.7  HGB 8.9*  HCT 28.1*  PLT 223   ------------------------------------------------------------------------------------------------------------------  Chemistries   Recent Labs Lab 11/19/14 0448  11/24/14 0543  NA 137  < > 139  K 5.3*  < > 4.1  CL 95*  < > 95*  CO2 34*  < > 37*  GLUCOSE 107*  < > 56*  BUN 60*  < > 62*  CREATININE 1.76*  < > 1.65*  CALCIUM 8.0*  < > 8.1*  AST 61*  --   --   ALT 20  --   --   ALKPHOS 80  --   --   BILITOT 0.9  --   --   < > = values in this interval not displayed.  ASSESSMENT AND PLAN:   81 year old female with a history of chronic systolic heart failure, DJD with functional quadriplegia, type 2 diabetes and hypertension who presented with elevation in her creatinine and edema.  1. Acute on chronic renal failure 4:  Patient's creatinine has much improved 1.7->1.6 Off lasix and dobutamine drip. - changed to furosemide   IV q12 - Discontinue albumin.  - Plan to remove foley catheter tomorrow.   2. ESBL urinary tract infection:  on Invanz which was started on September 24. She has PICC line and will need 10 days of antibiotics. Up till 11/21/14- stopped now.  3. Acute on chronic systolic heart failure with anasarca: Her echocardiogram showed EF of 20% with hypokinesis of several areas. 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 Apreciate cardio input. They would like to Monitor closely off  inotropic support to ensure stability.  4. CAD status post CABG: Patient is not on ACE inhibitor/ARB due to hypotension and acute renal failure.   5. Atrial fibrillation: heart rate is controlled. Patient will continue on Eliquis and amiodarone.  6. Diabetes type 2: Continue Lantus and sliding scale insulin.  7. Hypothyroidism   On Levothyroxin  She lives at [place] [facility] and will likely need rehab on D/C  Management plans discussed with the patient and she is in agreement.  CODE STATUS: Full  TOTAL TIME TAKING CARE OF THIS PATIENT: 35 minutes.   I discussed the condition and co-ordinated plan with Nephrology and cardiology.    POSSIBLE D/C 1-2 days, DEPENDING ON CLINICAL CONDITION.   [facility], Jiovany Scheffel M.D on 11/24/2014 at 3:50 PM  Between 7am to 6pm - Pager - [phone] After 6pm go to [email or web address] - password [email or web address] [facility]  [facility]  Office  [phone]  CC: Primary care physician; Fidel Levy, MD

## 2014-11-24 NOTE — Progress Notes (Signed)
   11/24/14 1617  Clinical Encounter Type  Visited With Patient  Visit Type Follow-up;Spiritual support  Referral From Palliative care team  Consult/Referral To Chaplain  Spiritual Encounters  Spiritual Needs Literature;Brochure;Emotional;Other (Comment)  Stress Factors  Patient Stress Factors Other (Comment)  Chaplain rounded in the unit and spoke with Dr. Orvan Falconer regarding a HCPOA. I asked her to put in a spiritual consult for when the granddaughter [name] will be able to assist. I followed up with patient and that was her desire as well so I left a HCPOA packet on the patient's window sill for when her granddaughter is able to discuss it with her.Contacted a Nurse Tech to assist patient with her other needs. Chaplain Lelia Jons A. Rhys Lichty Ext. [phone]

## 2014-11-24 NOTE — Progress Notes (Signed)
Patient: [name] / Admit Date: 11/09/2014 / Date of Encounter: 11/24/2014, 9:45 AM   Subjective: Continues to improve. SCr continues to improve from 1.92-->1.65 . She was switched to Lasix IV push instead of a drip. Overall she is feeling better although she did not sleep well.  Review of Systems: Review of Systems  Constitutional: Positive for malaise/fatigue. Negative for fever, weight loss and diaphoresis.  HENT: Negative for congestion.   Eyes: Negative for discharge and redness.  Respiratory: Positive for cough and shortness of breath. Negative for hemoptysis, sputum production and wheezing.   Cardiovascular: Positive for leg swelling. Negative for chest pain, palpitations, orthopnea, claudication and PND.  Gastrointestinal: Negative for nausea and vomiting.  Neurological: Positive for weakness. Negative for dizziness, tingling, tremors, sensory change, speech change, focal weakness and loss of consciousness.  Psychiatric/Behavioral: The patient is not nervous/anxious.     Objective: Telemetry: NSR, 80's, PVCs Physical Exam: Blood pressure 111/61, pulse 76, temperature 97.9 F (36.6 C), temperature source Oral, resp. rate 11, height  (1.549 m), weight 179 lb 3.7 oz (81.3 kg), SpO2 100 %. Body mass index is 33.88 kg/(m^2). General: Well developed, well nourished, in no acute distress. Head: Normocephalic, atraumatic, sclera non-icteric, no xanthomas, nares are without discharge. Neck: Negative for carotid bruits. Improving JVP. Lungs: Clear bilaterally to auscultation without wheezes, rales, or rhonchi. Breathing is unlabored. Heart: RRR S1 S2 without murmurs, rubs, or gallops.  Abdomen: Soft, non-tender, non-distended with normoactive bowel sounds. No rebound/guarding. Extremities: No clubbing or cyanosis. 1 + pitting edema LE. Distal pedal pulses are 2+ and equal bilaterally. Neuro: Alert and oriented X 3. Moves all extremities spontaneously. Psych:  Responds  to questions appropriately with a normal affect.   Intake/Output Summary (Last 24 hours) at 11/24/14 0945 Last data filed at 11/24/14 0800  Gross per 24 hour  Intake 910.63 ml  Output   1110 ml  Net -199.37 ml    Inpatient Medications:  . acidophilus  1 capsule Oral BID  . amiodarone  200 mg Oral Daily  . antiseptic oral rinse  7 mL Mouth Rinse BID  . apixaban  2.5 mg Oral BID  . atorvastatin  40 mg Oral q1800  . docusate sodium  100 mg Oral BID  . feeding supplement  1 Container Oral TID WC  . furosemide  40 mg Intravenous Q12H  . gabapentin  300 mg Oral QHS  . insulin aspart  0-5 Units Subcutaneous QHS  . insulin aspart  0-9 Units Subcutaneous TID WC  . levothyroxine  100 mcg Oral QAC breakfast  . lubiprostone  24 mcg Oral BID WC  . nystatin cream   Topical BID  . pantoprazole  40 mg Oral Daily  . sodium chloride  3 mL Intravenous Q12H   Infusions:     Labs:  Recent Labs  11/23/14 0454 11/24/14 0543  NA 138 139  K 4.2 4.1  CL 95* 95*  CO2 36* 37*  GLUCOSE 72 56*  BUN 62* 62*  CREATININE 1.65* 1.65*  CALCIUM 8.3* 8.1*  PHOS  --  3.9    Recent Labs  11/24/14 0543  ALBUMIN 3.5    Recent Labs  11/22/14 0518  WBC 5.7  HGB 8.9*  HCT 28.1*  MCV 79.9*  PLT 223   No results for input(s): CKTOTAL, CKMB, TROPONINI in the last 72 hours. Invalid input(s): POCBNP No results for input(s): HGBA1C in the last 72 hours.   Weights: Filed Weights   11/22/14  0500 11/23/14 0454 11/24/14 0500  Weight: 183 lb 13.8 oz (83.4 kg) 184 lb 11.9 oz (83.8 kg) 179 lb 3.7 oz (81.3 kg)     Radiology/Studies:  Dg Chest 1 View  11/22/2014   CLINICAL DATA:  Hypoxia  EXAM: CHEST 1 VIEW  COMPARISON:  11/15/2014  FINDINGS: Prior CABG. Cardiomegaly with vascular congestion and bilateral airspace opacities in the perihilar and lower lobe regions, most compatible with edema/ CHF. Suspect small layering effusions.  IMPRESSION: Mild to moderate CHF.  Suspect small layering effusions.    Electronically Signed   By: Charlett Nose M.D.   On: 11/22/2014 08:35   Dg Chest 1 View  11/12/2014   CLINICAL DATA:  PICC line placement  EXAM: CHEST 1 VIEW  COMPARISON:  11/09/2014  FINDINGS: Right PICC line is in place with the tip at the cavoatrial junction. Prior CABG. Cardiomegaly. Concern for bilateral airspace opacities, new since prior study. This could represent edema. No visible effusions.  IMPRESSION: Right PICC line tip at the cavoatrial junction.  Cardiomegaly. New diffuse bilateral airspace disease concerning for edema/CHF.   Electronically Signed   By: Charlett Nose M.D.   On: 11/12/2014 14:28   US Renal  11/11/2014   CLINICAL DATA:  Acute renal failure  EXAM: RENAL / URINARY TRACT ULTRASOUND COMPLETE  COMPARISON:  CT 10/20/2014  FINDINGS: Right Kidney:  Length: 10.8 cm. Slightly increased echotexture. No mass or hydronephrosis visualized.  Left Kidney:  Length: 8.9 cm. Increased echotexture. No mass or hydronephrosis visualized.  Bladder:  Appears normal for degree of bladder distention.  Ascites noted in the abdomen adjacent to the liver and spleen as seen on recent CT  IMPRESSION: Increased echotexture within the kidneys bilaterally suggesting chronic medical renal disease. No hydronephrosis.  Ascites.   Electronically Signed   By: Charlett Nose M.D.   On: 11/11/2014 09:48   Dg Chest Port 1 View  11/15/2014   CLINICAL DATA:  Cough and congestion, onset this evening.  EXAM: PORTABLE CHEST 1 VIEW  COMPARISON:  Chest radiograph 11/12/2014  FINDINGS: Tip of the right upper extremity PICC in the distal SVC. Patient is post median sternotomy. There is stable cardiomegaly. Diminished bilateral airspace disease from prior exam, likely improved edema. Mild right infrahilar and left suprahilar atelectasis. Limited left basilar assessment scenario soft tissue attenuation. Chronic deformity about the left proximal humerus, unchanged.  IMPRESSION: 1. Findings suggest resolving pulmonary edema.  Stable  cardiomegaly. 2. Tip of the right upper extremity PICC in the distal SVC.   Electronically Signed   By: Rubye Oaks M.D.   On: 11/15/2014 21:58   Dg Chest Port 1 View  11/09/2014   CLINICAL DATA:  Sepsis.  Hypoxia.  EXAM: PORTABLE CHEST 1 VIEW  COMPARISON:  October 18, 2014.  FINDINGS: Stable cardiomegaly. Status post coronary artery bypass graft. No pneumothorax is noted. Left basilar opacity is noted concerning for pneumonia or atelectasis with associated pleural effusion. Mild right basilar opacity is noted concerning for subsegmental atelectasis or mild pleural effusion. Bony thorax is unremarkable.  IMPRESSION: Left basilar opacity is noted concerning for pneumonia or atelectasis with associated pleural effusion. Mild right basilar opacity is noted concerning for subsegmental atelectasis or pleural effusion.   Electronically Signed   By: Lupita Raider, M.D.   On: 11/09/2014 14:58     Assessment and Plan  81 y.o. female with h/o CAD s/p CABG s/p PCI on SVGs, ischemic cardiomyopathy/chronic systolic CHF, PAF on Eliquis, PVD, COPD requiring nocturnal oxygen,  CKD stage III, IDDM, HLD, and HTN who has been admitted recently to [facility] in mid June and mid July for ESBL UTI, HCAP and acute on chronic systolic CHF, mid August for ileus, and late August for HCAP again who presented to [facility] on 9/22 with from [facility] with acute on chronic CKD. Cardiology was consulted on 10/4 with acute on chronic systolic CHF in the setting of newly depressed EF of 20-25% by echo on 11/14/2014.  1. Acute on chronic systolic CHF: -Improving  -Leading to anasarca s/p albumin, LE edema, and HTN -EF has worsened from previous 50% by echo in 2014 to now 20-25% this admission -Diuresis improved with dobutamine gtt. dopamine was discontinued 2 days ago. - I stopped dobutamine today. Monitor closely off inotropic support to ensure stability. -Continue IV Lasix for now. -If blood pressure remains stable, recommend resuming  small dose carvedilol 3.125 mg twice daily followed by the addition of  Imdur/hydralazine if tolerated by blood pressure   2. Acute on CKD stage IV: -Improving  -Likely in the setting of low cardiac output -This gradually improved.  3. PAF: -Currently in sinus rhythm -Restart Coreg as above -Amiodarone 200 mg daily  -Eliquis 2.5 mg bid -CHADSVASc at least 7 (CHF, HTN, DM, vascular disease, age x 2, female)  4. CAD s/p CABG s/p PCI as above: -No angina - Given multiple comorbidities, she is not a good candidate for further ischemic workup. -On Eliquis in place of aspirin  -Coreg as above -Lipitor 40 mg  5. DM2: -SSI per IM  Disposition: I have known the patient over the last few years. she clearly had significant deterioration over the last 6 months with multiple admissions for heart failure and other medical issues. Functional capacity is limited by her left hip. She has not been able to have hip surgery due to multiple comorbidities. I agree that short-term rehabilitation might be beneficial to see if the patient can walk again with a walker as she used to do in the past.   for the most part she has mostly been wheelchair bound.   Signed, Lorine Bears, MD   11/24/2014, 9:45 AM

## 2014-11-25 ENCOUNTER — Inpatient Hospital Stay: Payer: Medicare Other

## 2014-11-25 LAB — CBC
HEMATOCRIT: 27.5 % — AB (ref 35.0–47.0)
HEMOGLOBIN: 8.5 g/dL — AB (ref 12.0–16.0)
MCH: 25 pg — ABNORMAL LOW (ref 26.0–34.0)
MCHC: 30.9 g/dL — AB (ref 32.0–36.0)
MCV: 80.9 fL (ref 80.0–100.0)
Platelets: 218 10*3/uL (ref 150–440)
RBC: 3.4 MIL/uL — ABNORMAL LOW (ref 3.80–5.20)
RDW: 27.4 % — ABNORMAL HIGH (ref 11.5–14.5)
WBC: 5.3 10*3/uL (ref 3.6–11.0)

## 2014-11-25 LAB — C DIFFICILE QUICK SCREEN W PCR REFLEX
C DIFFICILE (CDIFF) TOXIN: NEGATIVE
C Diff antigen: POSITIVE — AB

## 2014-11-25 LAB — BASIC METABOLIC PANEL
Anion gap: 7 (ref 5–15)
BUN: 58 mg/dL — ABNORMAL HIGH (ref 6–20)
CHLORIDE: 95 mmol/L — AB (ref 101–111)
CO2: 37 mmol/L — AB (ref 22–32)
CREATININE: 1.64 mg/dL — AB (ref 0.44–1.00)
Calcium: 8.4 mg/dL — ABNORMAL LOW (ref 8.9–10.3)
GFR calc non Af Amer: 28 mL/min — ABNORMAL LOW (ref >60)
GFR, EST AFRICAN AMERICAN: 33 mL/min — AB (ref >60)
GLUCOSE: 62 mg/dL — AB (ref 65–99)
Potassium: 4.4 mmol/L (ref 3.5–5.1)
Sodium: 139 mmol/L (ref 135–145)

## 2014-11-25 LAB — GLUCOSE, CAPILLARY
GLUCOSE-CAPILLARY: 55 mg/dL — AB (ref 65–99)
GLUCOSE-CAPILLARY: 99 mg/dL (ref 65–99)
GLUCOSE-CAPILLARY: 127 mg/dL — AB (ref 65–99)
GLUCOSE-CAPILLARY: 124 mg/dL — AB (ref 65–99)
Glucose-Capillary: 114 mg/dL — ABNORMAL HIGH (ref 65–99)
Glucose-Capillary: 156 mg/dL — ABNORMAL HIGH (ref 65–99)

## 2014-11-25 LAB — CLOSTRIDIUM DIFFICILE BY PCR: Toxigenic C. Difficile by PCR: NEGATIVE

## 2014-11-25 MED ORDER — FUROSEMIDE 10 MG/ML IJ SOLN
40.0000 mg | Freq: Three times a day (TID) | INTRAMUSCULAR | Status: DC
  Administered 2014-11-25 – 2014-11-27 (×6): 40 mg via INTRAVENOUS
  Filled 2014-11-25 (×6): qty 4

## 2014-11-25 MED ORDER — METOPROLOL TARTRATE 25 MG PO TABS
12.5000 mg | ORAL_TABLET | Freq: Two times a day (BID) | ORAL | Status: DC
  Administered 2014-11-25 – 2014-11-29 (×9): 12.5 mg via ORAL
  Filled 2014-11-25 (×9): qty 1

## 2014-11-25 NOTE — Progress Notes (Signed)
OT Cancellation Note  Patient Details Name: [name] MRN: [id number] DOB: [birth year]   Cancelled Treatment:      Attempted to see patient this am however, patient was off the floor for testing at CT scan per nursing.  Will continue attempts as time permits. \  [name] T Lovett, OTR/L, CLT Lovett,[name] 11/25/2014, 10:04 AM

## 2014-11-25 NOTE — Progress Notes (Signed)
Subjective: Resting comfortably  No CP  Objective: Filed Vitals:   11/25/14 0900 11/25/14 1000 11/25/14 1100 11/25/14 1200  BP: 117/77 118/71 108/93 121/54  Pulse: 72 79 92 77  Temp:    97.7 F (36.5 C)  TempSrc:    Oral  Resp: Height:      Weight:      SpO2: 98% 100% 86% 98%   Weight change: -14.1 oz (-0.4 kg)  Intake/Output Summary (Last 24 hours) at 11/25/14 1347 Last data filed at 11/25/14 1000  Gross per 24 hour  Intake    510 ml  Output    427 ml  Net     83 ml   Net Neg  4.06 L     General: Alert, awake, oriented x3, in no acute distress Neck:  JVP is normal Heart: Regular rate and rhythm, without murmurs, rubs, gallops.  Lungs: Clear to auscultation.  No rales or wheezes. Exemities:  1+ edema.  Upper and LE   Neuro: Grossly intact, nonfocal.  Tele:  SR     Lab Results: Results for orders placed or performed during the hospital encounter of 11/09/14 (from the past 24 hour(s))  Glucose, capillary     Status: Abnormal   Collection Time: 11/24/14  4:21 PM  Result Value Ref Range   Glucose-Capillary 102 (H) 65 - 99 mg/dL  Glucose, capillary     Status: None   Collection Time: 11/24/14  9:37 PM  Result Value Ref Range   Glucose-Capillary 87 65 - 99 mg/dL  C difficile quick scan w PCR reflex     Status: Abnormal   Collection Time: 11/24/14 11:01 PM  Result Value Ref Range   C Diff antigen POSITIVE (A) NEGATIVE   C Diff toxin NEGATIVE NEGATIVE   C Diff interpretation      Negative for toxigenic C. difficile. Toxin gene and active toxin production not detected. May be a nontoxigenic strain of C. difficile bacteria present, lacking the ability to produce toxin.  Clostridium Difficile by PCR     Status: None   Collection Time: 11/24/14 11:01 PM  Result Value Ref Range   Toxigenic C Difficile by pcr NEGATIVE NEGATIVE  CBC     Status: Abnormal   Collection Time: 11/25/14  4:38 AM  Result Value Ref Range   WBC 5.3 3.6 - 11.0 K/uL   RBC 3.40 (L)  3.80 - 5.20 MIL/uL   Hemoglobin 8.5 (L) 12.0 - 16.0 g/dL   HCT 84.6 (L) 96.2 - 95.2 %   MCV 80.9 80.0 - 100.0 fL   MCH 25.0 (L) 26.0 - 34.0 pg   MCHC 30.9 (L) 32.0 - 36.0 g/dL   RDW 84.1 (H) 32.4 - 40.1 %   Platelets 218 150 - 440 K/uL  Basic metabolic panel     Status: Abnormal   Collection Time: 11/25/14  4:38 AM  Result Value Ref Range   Sodium 139 135 - 145 mmol/L   Potassium 4.4 3.5 - 5.1 mmol/L   Chloride 95 (L) 101 - 111 mmol/L   CO2 37 (H) 22 - 32 mmol/L   Glucose, Bld 62 (L) 65 - 99 mg/dL   BUN 58 (H) 6 - 20 mg/dL   Creatinine, Ser 0.27 (H) 0.44 - 1.00 mg/dL   Calcium 8.4 (L) 8.9 - 10.3 mg/dL   GFR calc non Af Amer 28 (L) >60 mL/min   GFR calc Af Amer 33 (L) >60 mL/min   Anion gap  7 5 - 15  Glucose, capillary     Status: Abnormal   Collection Time: 11/25/14  4:51 AM  Result Value Ref Range   Glucose-Capillary 55 (L) 65 - 99 mg/dL  Glucose, capillary     Status: None   Collection Time: 11/25/14  5:46 AM  Result Value Ref Range   Glucose-Capillary 99 65 - 99 mg/dL  Glucose, capillary     Status: Abnormal   Collection Time: 11/25/14  7:15 AM  Result Value Ref Range   Glucose-Capillary 127 (H) 65 - 99 mg/dL  Glucose, capillary     Status: Abnormal   Collection Time: 11/25/14 11:31 AM  Result Value Ref Range   Glucose-Capillary 114 (H) 65 - 99 mg/dL    Studies/Results: Dg Chest 2 View  11/25/2014   CLINICAL DATA:  Shortness of breath.  CHF.  EXAM: CHEST  2 VIEW  COMPARISON:  11/22/2014 chest radiograph.  FINDINGS: Right PICC terminates in the lower third of the superior vena cava. Median sternotomy wires are aligned and intact. No pneumothorax. Stable small right and moderate left pleural effusions. Stable mild pulmonary edema. Stable cardiomediastinal silhouette with mild to moderate cardiomegaly. Stable left greater than right bibasilar lung opacities, favor atelectasis.  IMPRESSION: 1. Stable mild congestive heart failure. 2. Stable small right and moderate left  pleural effusions with associated bibasilar lung opacities likely representing atelectasis.   Electronically Signed   By: Delbert Phenix M.D.   On: 11/25/2014 10:07    Medications:Reviewed   @  1  Acute on chronic systolic CHF  Note plans to increase lasix to tid today  Woll follow  Check BMET in AM  Off of Dobutamine, DA.  Not on coreg or Hydralzine / NTG  Would try low dlose lopressor 12.5 bid    2.  PAF  Maintaining SR  Continue amiodarone    3.  CAD  S/p CABG and PCI  No symptoms of angina   4  CKD  Stage IV    LOS: 16 days   Dietrich Pates 11/25/2014, 1:47 PM

## 2014-11-25 NOTE — Progress Notes (Signed)
Did a glucose check this morning, was 55.  Pt requested a chocolate milk. Pt drank it all and sugar went up to 99.

## 2014-11-25 NOTE — Progress Notes (Signed)
Subjective:   UOP 560 recorded. Peripheral edema much improved Creatinine 1.64 (1.65) Furosemide  IV q12  Objective:  Vital signs in last 24 hours:  Temp:  [95 F (35 C)-97.5 F (36.4 C)] 97.5 F (36.4 C) (10/08 0200) Pulse Rate:  [67-86] 72 (10/08 0600) Resp:  [9-22] 11 (10/08 0600) BP: (110-129)/(57-74) 125/66 mmHg (10/08 0600) SpO2:  [93 %-100 %] 98 % (10/08 0600) Weight:  [80.9 kg (178 lb 5.6 oz)] 80.9 kg (178 lb 5.6 oz) (10/08 0500)  Weight change: -0.4 kg (-14.1 oz) Filed Weights   11/23/14 0454 11/24/14 0500 11/25/14 0500  Weight: 83.8 kg (184 lb 11.9 oz) 81.3 kg (179 lb 3.7 oz) 80.9 kg (178 lb 5.6 oz)    Intake/Output:    Intake/Output Summary (Last 24 hours) at 11/25/14 0932 Last data filed at 11/25/14 0600  Gross per 24 hour  Intake    160 ml  Output    503 ml  Net   -343 ml     Physical Exam: General:  elderly, frail, laying in the bed   HEENT  moist mucous membranes, anicteric sclera   Neck  supple, no masses   Pulm/lungs  mild basilar crackles, normal effort ,  oxygen 4 Litres  CVS/Heart  regular rhythm   Abdomen:   soft, nontender, nondistended   Extremities: trace dependent edema   Neurologic:  alert, oriented, speech normal   Skin:  no acute rashes, skin breakdown over shins      GU  Foley in place     Basic Metabolic Panel:   Recent Labs Lab 11/21/14 0541 11/22/14 0926 11/23/14 0454 11/24/14 0543 11/25/14 0438  NA 136 136 138 139 139  K 4.9 4.6 4.2 4.1 4.4  CL 96* 95* 95* 95* 95*  CO2 34* 34* 36* 37* 37*  GLUCOSE 101* 168* 72 56* 62*  BUN 68* 68* 62* 62* 58*  CREATININE 2.09* 1.92* 1.65* 1.65* 1.64*  CALCIUM 8.1* 8.2* 8.3* 8.1* 8.4*  PHOS  --   --   --  3.9  --      CBC:  Recent Labs Lab 11/19/14 0448 11/22/14 0518 11/25/14 0438  WBC 6.2 5.7 5.3  HGB 9.3* 8.9* 8.5*  HCT 28.8* 28.1* 27.5*  MCV 80.0 79.9* 80.9  PLT 279 223 218      Microbiology:  Recent Results (from the past 720 hour(s))  Blood Culture  (routine x 2)     Status: None   Collection Time: 11/09/14  2:34 PM  Result Value Ref Range Status   Specimen Description BLOOD RIGHT ARM  Final   Special Requests BOTTLES DRAWN AEROBIC AND ANAEROBIC 3CC  Final   Culture NO GROWTH 5 DAYS  Final   Report Status 11/14/2014 FINAL  Final  Urine culture     Status: None   Collection Time: 11/09/14  2:34 PM  Result Value Ref Range Status   Specimen Description URINE, CLEAN CATCH  Final   Special Requests NONE  Final   Culture   Final    >=100,000 COLONIES/mL ESCHERICHIA COLI ESBL-EXTENDED SPECTRUM BETA LACTAMASE-THE ORGANISM IS RESISTANT TO PENICILLINS, CEPHALOSPORINS AND AZTREONAM ACCORDING TO CLSI M100-S15 VOL.25 N01 JAN 2005. CRITICAL RESULT CALLED TO, READ BACK BY AND VERIFIED WITH: DANIEL JACOBS,RN 11/11/2014 0909 JRS.    Report Status 11/11/2014 FINAL  Final   Organism ID, Bacteria ESCHERICHIA COLI  Final      Susceptibility   Escherichia coli - MIC*    AMPICILLIN >=32 RESISTANT Resistant  CEFAZOLIN >=64 RESISTANT Resistant     CEFTRIAXONE >=64 RESISTANT Resistant     CIPROFLOXACIN >=4 RESISTANT Resistant     GENTAMICIN <=1 SENSITIVE Sensitive     IMIPENEM <=0.25 SENSITIVE Sensitive     NITROFURANTOIN <=16 SENSITIVE Sensitive     TRIMETH/SULFA >=320 RESISTANT Resistant     Extended ESBL POSITIVE Resistant     PIP/TAZO Value in next row Sensitive      SENSITIVE8    LEVOFLOXACIN Value in next row Resistant      RESISTANT>=8    * >=100,000 COLONIES/mL ESCHERICHIA COLI  MRSA PCR Screening     Status: None   Collection Time: 11/09/14  6:24 PM  Result Value Ref Range Status   MRSA by PCR NEGATIVE NEGATIVE Final    Comment:        The GeneXpert MRSA Assay (FDA approved for NASAL specimens only), is one component of a comprehensive MRSA colonization surveillance program. It is not intended to diagnose MRSA infection nor to guide or monitor treatment for MRSA infections.   MRSA PCR Screening     Status: None   Collection  Time: 11/21/14  2:04 PM  Result Value Ref Range Status   MRSA by PCR NEGATIVE NEGATIVE Final    Comment:        The GeneXpert MRSA Assay (FDA approved for NASAL specimens only), is one component of a comprehensive MRSA colonization surveillance program. It is not intended to diagnose MRSA infection nor to guide or monitor treatment for MRSA infections.   C difficile quick scan w PCR reflex     Status: Abnormal   Collection Time: 11/24/14 11:01 PM  Result Value Ref Range Status   C Diff antigen POSITIVE (A) NEGATIVE Final   C Diff toxin NEGATIVE NEGATIVE Final   C Diff interpretation   Final    Negative for toxigenic C. difficile. Toxin gene and active toxin production not detected. May be a nontoxigenic strain of C. difficile bacteria present, lacking the ability to produce toxin.  Clostridium Difficile by PCR     Status: None   Collection Time: 11/24/14 11:01 PM  Result Value Ref Range Status   Toxigenic C Difficile by pcr NEGATIVE NEGATIVE Final    Coagulation Studies: No results for input(s): LABPROT, INR in the last 72 hours.  Urinalysis: No results for input(s): COLORURINE, LABSPEC, PHURINE, GLUCOSEU, HGBUR, BILIRUBINUR, KETONESUR, PROTEINUR, UROBILINOGEN, NITRITE, LEUKOCYTESUR in the last 72 hours.  Invalid input(s): APPERANCEUR    Imaging: No results found.   Medications:     . acidophilus  1 capsule Oral BID  . amiodarone  200 mg Oral Daily  . antiseptic oral rinse  7 mL Mouth Rinse BID  . apixaban  2.5 mg Oral BID  . atorvastatin  40 mg Oral q1800  . docusate sodium  100 mg Oral BID  . feeding supplement  1 Container Oral TID WC  . furosemide  40 mg Intravenous Q12H  . gabapentin  300 mg Oral QHS  . insulin aspart  0-5 Units Subcutaneous QHS  . insulin aspart  0-9 Units Subcutaneous TID WC  . levothyroxine  100 mcg Oral QAC breakfast  . lubiprostone  24 mcg Oral BID WC  . nystatin cream   Topical BID  . pantoprazole  40 mg Oral Daily  . sodium  chloride  3 mL Intravenous Q12H   acetaminophen **OR** acetaminophen, albuterol, alum & mag hydroxide-simeth, HYDROcodone-acetaminophen, nitroGLYCERIN, ondansetron **OR** ondansetron (ZOFRAN) IV, polyethylene glycol, promethazine, simethicone, sodium chloride, zolpidem  Assessment/ Plan:  81 y.o. female with a PMHX of chronic systolic congestive heart failure, type 2 diabetes, hypertension, atrial fibrillation, DJD, COPD, coronary disease/CABG 1988, catheter 2015, recurrent UTIs, chronic kidney disease, who was admitted to [facility] on 11/09/2014 for evaluation of elevated creatinine above her baseline and increased swelling.   1. Acute renal failure on chronic kidney disease stage 4 . Patient's baseline creatinine is 1.66/GFR of 28. CKD is likely secondary to atherosclerosis and hypertension. Creatinine now at baseline.  Acute renal failure from acute cardiorenal syndrome. Improved with dobutamine, dopamine, albumin and furosemide. - Continue furosemide  IV q12 - Discontinued albumin. Now off dopamine and dobutamine - Appreciate cardiology input.   2. Hypertension and Anasarca/edema. From systolic congestive heart failure acute exacerbation. - Continue furosemide as above.  - Low salt diet and fluid restriction.     LOS: 16 Dani Danis 10/8/20169:32 AM

## 2014-11-25 NOTE — Care Management Note (Signed)
Case Management Note  Patient Details  Name: [name] MRN: [id number] Date of Birth: 1931/06/15  Subjective/Objective:      Dr Orvan Falconer, Palliative Care, is to meet with grandaughter [name], who is also POA, on Monday to discuss discharge planning, DNR status, etc.  [name] was admitted 11/09/14 from [facility] per chronic renal failure. Grandaughter would like for her to return to [facility].             Action/Plan:   Expected Discharge Date:                  Expected Discharge Plan:     In-House Referral:     Discharge planning Services     Post Acute Care Choice:    Choice offered to:     DME Arranged:    DME Agency:     HH Arranged:    HH Agency:     Status of Service:     Medicare Important Message Given:   (IM given) Date Medicare IM Given:    Medicare IM give by:    Date Additional Medicare IM Given:    Additional Medicare Important Message give by:     If discussed at Long Length of Stay Meetings, dates discussed:    Additional Comments:  Irving Lubbers A, RN 11/25/2014, 3:54 PM

## 2014-11-25 NOTE — Progress Notes (Signed)
[facility] - [place] at [facility]   PATIENT NAME: [name]    MR#:  [id number]  DATE OF BIRTH:  [birth year]  SUBJECTIVE:   Continues to be SOB minimal movement in bed. Negative 4.3 liters. Off dobutamine drip.  REVIEW OF SYSTEMS:    Review of Systems  Constitutional: Negative for fever, chills and malaise/fatigue.  HENT: Negative for sore throat.   Eyes: Negative for blurred vision.  Respiratory: Negative for cough, hemoptysis, shortness of breath and wheezing.   Cardiovascular: Positive for leg swelling. Negative for chest pain and palpitations.  Gastrointestinal: Negative for nausea, vomiting, abdominal pain, diarrhea and blood in stool.  Genitourinary: Negative for dysuria.  Musculoskeletal: Negative for back pain.  Neurological: Negative for dizziness, tremors and headaches.  Endo/Heme/Allergies: Does not bruise/bleed easily.    Tolerating Diet: Yes DRUG ALLERGIES:   Allergies  Allergen Reactions  . Contrast Media [Iodinated Diagnostic Agents] Shortness Of Breath  . Tramadol Nausea Only  . Valium [Diazepam] Other (See Comments)    Reaction:  Elevated heart rate  . Iodine Strong [Iodine] Rash  . Penicillins Rash   VITALS:  Blood pressure 125/66, pulse 72, temperature 97.5 F (36.4 C), temperature source Oral, resp. rate 11, height  (1.549 m), weight 80.9 kg (178 lb 5.6 oz), SpO2 98 %. PHYSICAL EXAMINATION:   Physical Exam   GENERAL:  81 y.o.-year-old patient lying in the bed with no acute distress.  EYES: Pupils equal, round, reactive to light and accommodation. No scleral icterus. Extraocular muscles intact.  HEENT: Head atraumatic, normocephalic. Oropharynx and nasopharynx clear.  NECK:  Supple, no jugular venous distention. No thyroid enlargement, no tenderness.  LUNGS: Normal work of breathing. Poor air entry bilaterally. Crackles left base. CARDIOVASCULAR: S1, S2 normal. No murmurs, rubs, or gallops.  ABDOMEN: Soft,  nontender, nondistended. Bowel sounds present. No organomegaly or mass.  EXTREMITIES: No cyanosis, clubbing.    NEUROLOGIC: Cranial nerves II through XII are intact. No focal Motor or sensory deficits b/l.   PSYCHIATRIC: The patient is alert and oriented x 3.  SKIN: No obvious rash, lesion, or ulcer.  Generalized anasarca.  LABORATORY PANEL:   CBC  Recent Labs Lab 11/25/14 0438  WBC 5.3  HGB 8.5*  HCT 27.5*  PLT 218   ------------------------------------------------------------------------------------------------------------------  Chemistries   Recent Labs Lab 11/19/14 0448  11/25/14 0438  NA 137  < > 139  K 5.3*  < > 4.4  CL 95*  < > 95*  CO2 34*  < > 37*  GLUCOSE 107*  < > 62*  BUN 60*  < > 58*  CREATININE 1.76*  < > 1.64*  CALCIUM 8.0*  < > 8.4*  AST 61*  --   --   ALT 20  --   --   ALKPHOS 80  --   --   BILITOT 0.9  --   --   < > = values in this interval not displayed.  ASSESSMENT AND PLAN:   81 year old female with a history of chronic systolic heart failure, DJD with functional quadriplegia, type 2 diabetes and hypertension who presented with elevation in her creatinine and edema.  * Acute on chronic systolic heart failure with anasarca: Her echocardiogram showed EF of 20% with hypokinesis of several areas. 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 Apreciate cardio input.  Increase Lasix from 40 mg 2 times a day to 3 times a day. Monitor input and output. Low normal blood pressures. Repeat chest x-ray-still shows mild pulmonary edema with bilateral  pleural effusions left greater than right. Possible discharge in 1-2 days.  * Chronic respiratory failure is stable  * Acute on CKD 4 Patient's creatinine has improved Off lasix and dobutamine drip. - Discontinue albumin.   * ESBL urinary tract infection:  on Invanz which was started on September 24. She has PICC line and will need 10 days of antibiotics. Up till 11/21/14- stopped now.  * Anemia of chronic  disease Stable No need for transfusion  * CAD status post CABG: Patient is not on ACE inhibitor/ARB due to hypotension and acute renal failure.   * Atrial fibrillation: heart rate is controlled. Patient will continue on Eliquis and amiodarone.  * Diabetes type 2: Continue Lantus and sliding scale insulin.  * Hypothyroidism   On Levothyroxin  She lives at [facility] and will likely need rehab on D/C  Management plans discussed with the patient and she is in agreement.  CODE STATUS: Full  TOTAL TIME TAKING CARE OF THIS PATIENT: 35 minutes.   I discussed the condition and co-ordinated plan with Nephrology and cardiology.  POSSIBLE D/C 1-2 days, DEPENDING ON CLINICAL CONDITION.   Milagros Loll R M.D on 11/25/2014 at 10:59 AM  Between 7am to 6pm - Pager - [phone] After 6pm go to [email or web address] - password [email or web address] [facility]  [place] [facility]  Office  [phone]  CC: Primary care physician; Fidel Levy, MD

## 2014-11-25 NOTE — Plan of Care (Signed)
Problem: Acute Rehab PT Goals(only PT should resolve) Goal: Pt Will Go Supine/Side To Sit Pt will demonstrate MinA bed mobility supine to sitting edge-of-bed, rolling, sitting to supine to return to PLOF and to decrease caregiver burden.     Goal: Patient Will Transfer Sit To/From Stand Pt will transfer sit to/from-stand with RW at [facility] without loss-of-balance to demonstrate good safety awareness for independent mobility in home.

## 2014-11-25 NOTE — Progress Notes (Signed)
Patient transferred from CCU, oriented to room, fall, and safety contract reviewed. Focused assessment as charted. No complaints at this time. Skin verified with Shana Chute., RN. Telemetry box verified. [name]

## 2014-11-25 NOTE — Evaluation (Signed)
Physical Therapy Re-Evaluation Patient Details Name: [name] MRN: [id number] DOB: 1931-08-27 Today's Date: 11/25/2014   History of Present Illness  Pt with past medical history of chronic systolic congestive heart failure, DJD with patient being bedbound for last 6 months, type 2 diabetes, hypertension, A. fib on a liquid comes to the emergency room on 9/22 after she was found to have elevated creatinine on her routine lab work that was done at her nursing home. Pt recently transfered to step-down where she now has new PT order to continue treatment.   Clinical Impression  Pt is received recumbent in bed upon entry, awake, alert, and willing to participate. No acute distress noted, reporting only pain that is chronic and baseline. Pt is tearful and concerned about declining health, and worried about how to communicate this to young members of family. Pt is A&Ox3 and pleasant.Pt reports she was recently admitted to [facility] where she had begun PT, but had not been able to do very many session prior to coming here. Pt strength as screened functional mobility assessment is moderately weak, requiring min-modA to complete bed exercises, and demonstrates some limitation in mobility which are chronic, related to joint replacement surgeries. Pt has been bed bound for last 7 months as it has been related to progressive decline in function. Patient presenting with impairment of strength, range of motion, balance, oxygen perfusion, and activity tolerance, limiting ability to perform ADL and mobility tasks at  baseline level of function. Patient will benefit from skilled intervention to address the above impairments and limitations, in order to restore to prior level of function, improve patient safety upon discharge, and to decrease falls risk.       Follow Up Recommendations SNF (Return to [facility] to continue with PT there. )    Equipment Recommendations  Rolling walker with 5" wheels     Recommendations for Other Services       Precautions / Restrictions Precautions Precautions: Fall Precaution Comments: Enteric isolation.  Restrictions Weight Bearing Restrictions: No      Mobility  Bed Mobility Overal bed mobility: Needs Assistance Bed Mobility: Rolling Rolling: Max assist (assistance given from scapula to assist with trunk rotation; 10x bilat. )         General bed mobility comments: Performed rolling at this time only; pt reports recently having been cleaned up and is in pain from the mobility, asks to limit acitivity at this time.   Transfers                    Ambulation/Gait                Stairs            Wheelchair Mobility    Modified Rankin (Stroke Patients Only)       Balance                                             Pertinent Vitals/Pain Pain Assessment: Faces Faces Pain Scale: Hurts even more Pain Location: Pt reports she is in continued chronic pain, which is mildly worse than typicaly pain baseline. Does not further elaborate.  Pain Intervention(s): Limited activity within patient's tolerance;Monitored during session;Repositioned;Patient requesting pain meds-RN notified    Home Living Family/patient expects to be discharged to:: Skilled nursing facility Living Arrangements: Other relatives Available Help at Discharge: Family Type of  Home: House         Home Equipment: Wheelchair - manual;Hospital bed Additional Comments: Pt most recently at [facility] where she was receiving PT services and working on standing activity tolerance.    Prior Function Level of Independence: Needs assistance   Gait / Transfers Assistance Needed: Lift transfer from bed to w/c since at SNF; when living with granddaughter pt able to stand pivot to chair.     Comments: Progressive weakness and decreased functional ability since last admission and mostly bed bound at this time due to pain in L hip. Pt  also appears to have some mobility limitation in R hip and R knee.      Hand Dominance        Extremity/Trunk Assessment   Upper Extremity Assessment: Generalized weakness           Lower Extremity Assessment: Generalized weakness (Pitting edema throughtout BLE, mid thigh to foot. )      Cervical / Trunk Assessment:  (moderately weak. Requires assistance with rolling. )  Communication   Communication: No difficulties  Cognition Arousal/Alertness: Awake/alert Behavior During Therapy: WFL for tasks assessed/performed Overall Cognitive Status: Within Functional Limits for tasks assessed                      General Comments      Exercises General Exercises - Lower Extremity Ankle Circles/Pumps: AROM;Both;20 reps;Supine Short Arc Quad: AAROM;Both;15 reps;Supine Hip ABduction/ADduction: AAROM;Both;15 reps;Supine Hip Flexion/Marching: AAROM;Both;15 reps;Supine Other Exercises Other Exercises: rolling inittiation; *see mobility section Other Exercises: single leg leg press: 1x15 bilat, supine, manually resisted.      Assessment/Plan    PT Assessment Patient needs continued PT services  PT Diagnosis Generalized weakness;Acute pain   PT Problem List Decreased strength;Decreased range of motion;Decreased activity tolerance;Decreased balance;Decreased mobility;Decreased safety awareness  PT Treatment Interventions DME instruction;Functional mobility training;Therapeutic activities;Therapeutic exercise;Balance training;Patient/family education   PT Goals (Current goals can be found in the Care Plan section) Acute Rehab PT Goals Patient Stated Goal: Pt want to regain ability to perform pivot transfers.  PT Goal Formulation: With patient Time For Goal Achievement: 12/09/14 Potential to Achieve Goals: Fair    Frequency Min 2X/week   Barriers to discharge Inaccessible home environment;Decreased caregiver support      Co-evaluation               End of  Session Equipment Utilized During Treatment: Oxygen Activity Tolerance: Patient tolerated treatment well Patient left: in bed;with bed alarm set;with call bell/phone within reach Nurse Communication: Other (comment);Mobility status         Time: 1200-1219 PT Time Calculation (min) (ACUTE ONLY): 19 min   Charges:   PT Evaluation $PT Re-evaluation: 1 Procedure PT Treatments $Therapeutic Exercise: 8-22 mins   PT G Codes:        Buccola,Allan C 2014/12/08, 1:17 PM 1:21 PM  Rosamaria Lints, PT, DPT [place] License # [id number]

## 2014-11-26 DIAGNOSIS — Z66 Do not resuscitate: Secondary | ICD-10-CM

## 2014-11-26 LAB — GLUCOSE, CAPILLARY
GLUCOSE-CAPILLARY: 253 mg/dL — AB (ref 65–99)
Glucose-Capillary: 152 mg/dL — ABNORMAL HIGH (ref 65–99)
Glucose-Capillary: 159 mg/dL — ABNORMAL HIGH (ref 65–99)
Glucose-Capillary: 205 mg/dL — ABNORMAL HIGH (ref 65–99)

## 2014-11-26 NOTE — Progress Notes (Signed)
[facility] - [place] at [facility]   PATIENT NAME: [name]    MR#:  [id number]  DATE OF BIRTH:  November 13, 1931  SUBJECTIVE:   Continues to be SOB minimal movement in bed- worse today. coughing Negative 4 liters. Off dobutamine drip.  REVIEW OF SYSTEMS:    Review of Systems  Constitutional: Negative for fever, chills and malaise/fatigue.  HENT: Negative for sore throat.   Eyes: Negative for blurred vision.  Respiratory: Negative for cough, hemoptysis, shortness of breath and wheezing.   Cardiovascular: Positive for leg swelling. Negative for chest pain and palpitations.  Gastrointestinal: Negative for nausea, vomiting, abdominal pain, diarrhea and blood in stool.  Genitourinary: Negative for dysuria.  Musculoskeletal: Negative for back pain.  Neurological: Negative for dizziness, tremors and headaches.  Endo/Heme/Allergies: Does not bruise/bleed easily.    Tolerating Diet: Yes DRUG ALLERGIES:   Allergies  Allergen Reactions  . Contrast Media [Iodinated Diagnostic Agents] Shortness Of Breath  . Tramadol Nausea Only  . Valium [Diazepam] Other (See Comments)    Reaction:  Elevated heart rate  . Iodine Strong [Iodine] Rash  . Penicillins Rash   VITALS:  Blood pressure 119/58, pulse 60, temperature 97.6 F (36.4 C), temperature source Oral, resp. rate 16, height  (1.549 m), weight 81.9 kg (180 lb 8.9 oz), SpO2 99 %. PHYSICAL EXAMINATION:   Physical Exam   GENERAL:  81 y.o.-year-old patient lying in the bed with no acute distress.  EYES: Pupils equal, round, reactive to light and accommodation. No scleral icterus. Extraocular muscles intact.  HEENT: Head atraumatic, normocephalic. Oropharynx and nasopharynx clear.  NECK:  Supple, no jugular venous distention. No thyroid enlargement, no tenderness.  LUNGS: Normal work of breathing. Poor air entry bilaterally. Crackles left base. CARDIOVASCULAR: S1, S2 normal. No murmurs, rubs, or  gallops.  ABDOMEN: Soft, nontender, nondistended. Bowel sounds present. No organomegaly or mass.  EXTREMITIES: No cyanosis, clubbing.    NEUROLOGIC: Cranial nerves II through XII are intact. No focal Motor or sensory deficits b/l.   PSYCHIATRIC: The patient is alert and oriented x 3.  SKIN: No obvious rash, lesion, or ulcer.  Generalized anasarca.  LABORATORY PANEL:   CBC  Recent Labs Lab 11/25/14 0438  WBC 5.3  HGB 8.5*  HCT 27.5*  PLT 218   ------------------------------------------------------------------------------------------------------------------  Chemistries   Recent Labs Lab 11/25/14 0438  NA 139  K 4.4  CL 95*  CO2 37*  GLUCOSE 62*  BUN 58*  CREATININE 1.64*  CALCIUM 8.4*    ASSESSMENT AND PLAN:   81 year old female with a history of chronic systolic heart failure, DJD with functional quadriplegia, type 2 diabetes and hypertension who presented with elevation in her creatinine and edema.  * Acute on chronic systolic heart failure with anasarca: Her echocardiogram showed EF of 20% with hypokinesis of several areas. 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 Apreciate cardio input.  Lasix  IV TID Monitor input and output. Low normal blood pressures.  * Chronic respiratory failure is stable  * Acute on CKD 4 Patient's creatinine has improved Off lasix and dobutamine drip. - Discontinued albumin.   * ESBL urinary tract infection:  on Invanz which was started on September 24. She has PICC line and will need 10 days of antibiotics. Up till 11/21/14- stopped now.  * Anemia of chronic disease Stable No need for transfusion  * CAD status post CABG: Patient is not on ACE inhibitor/ARB due to hypotension and acute renal failure.   * Atrial fibrillation: heart rate is  controlled. Patient will continue on Eliquis and amiodarone.  * Diabetes type 2: Continue Lantus and sliding scale insulin.  * Hypothyroidism   On Levothyroxin  She lives at [facility] and will  likely need rehab on D/C  Management plans discussed with the patient and she is in agreement.  CODE STATUS: Full  TOTAL TIME TAKING CARE OF THIS PATIENT: 35 minutes.   POSSIBLE D/C 1-2 days, DEPENDING ON CLINICAL CONDITION.   Milagros Loll R M.D on 11/26/2014 at 12:52 PM  Between 7am to 6pm - Pager - [phone] After 6pm go to [email or web address] - password [email or web address] [facility]  [place] [facility]  Office  [phone]  CC: Primary care physician; Fidel Levy, MD

## 2014-11-26 NOTE — Progress Notes (Signed)
   Subjective: Pt just feels "blah"  Breathing is fair  No CP   Objective: Filed Vitals:   11/26/14 0521 11/26/14 0527 11/26/14 0825 11/26/14 1152  BP: 112/47  112/47 119/58  Pulse: 58   60  Temp: 97.7 F (36.5 C)   97.6 F (36.4 C)  TempSrc: Oral   Oral  Resp: 20   16  Height:      Weight:  180 lb 8.9 oz (81.9 kg)    SpO2: 97%   99%   Weight change: 10.4 oz (0.294 kg)  Intake/Output Summary (Last 24 hours) at 11/26/14 1322 Last data filed at 11/26/14 1100  Gross per 24 hour  Intake      0 ml  Output    275 ml  Net   -275 ml   Net neg 4 L  General: Alert, awake, oriented x3, in no acute distress Neck:  JVP is increased   Heart: Regular rate and rhythm, without murmurs, rubs, gallops.  Lungs: Clear to auscultation.  No rales or wheezes. Exemities:  1+ LE edema.   Neuro: Grossly intact, nonfocal.  Tele:  SR Lab Results: Results for orders placed or performed during the hospital encounter of 11/09/14 (from the past 24 hour(s))  Glucose, capillary     Status: Abnormal   Collection Time: 11/25/14  4:10 PM  Result Value Ref Range   Glucose-Capillary 124 (H) 65 - 99 mg/dL   Comment 1 Notify RN    Comment 2 Document in Chart   Glucose, capillary     Status: Abnormal   Collection Time: 11/25/14  9:32 PM  Result Value Ref Range   Glucose-Capillary 156 (H) 65 - 99 mg/dL  Glucose, capillary     Status: Abnormal   Collection Time: 11/26/14  7:32 AM  Result Value Ref Range   Glucose-Capillary 152 (H) 65 - 99 mg/dL   Comment 1 Notify RN    Comment 2 Document in Chart   Glucose, capillary     Status: Abnormal   Collection Time: 11/26/14 11:53 AM  Result Value Ref Range   Glucose-Capillary 159 (H) 65 - 99 mg/dL   Comment 1 Notify RN    Comment 2 Document in Chart     Studies/Results: No results found.  Medications: reviewed  @  1 Acute on chronic systolic CHF  Still with evid of volume overload on exam  Off of dobutamine  Lasix increased yesterday  Not  sure yet if this will be adequate to maintain balance  Follow for now.    2.  PAF  REmains in SR  Continue amiodarone and Eliquis    3  CAD  S/p CABG and PCI  No evid for acitve ischemia    4.  CKD  Cr stable  Follow  LOS: 17 days   Dietrich Pates 11/26/2014, 1:22 PM

## 2014-11-26 NOTE — Progress Notes (Signed)
Subjective:   Moved from CCU.  Patient now with more edema.  Furosemide 40mg  IV q8 UOP recorded at 275 Metoprolol initiated.   Objective:  Vital signs in last 24 hours:  Temp:  [97.7 F (36.5 C)-97.9 F (36.6 C)] 97.7 F (36.5 C) (10/09 0521) Pulse Rate:  [58-77] 58 (10/09 0521) Resp:  [11-21] 20 (10/09 0521) BP: (112-138)/(47-73) 112/47 mmHg (10/09 0825) SpO2:  [97 %-100 %] 97 % (10/09 0521) Weight:  [81.194 kg (179 lb)-81.9 kg (180 lb 8.9 oz)] 81.9 kg (180 lb 8.9 oz) (10/09 0527)  Weight change: 0.294 kg (10.4 oz) Filed Weights   11/25/14 0500 11/25/14 1509 11/26/14 0527  Weight: 80.9 kg (178 lb 5.6 oz) 81.194 kg (179 lb) 81.9 kg (180 lb 8.9 oz)    Intake/Output:    Intake/Output Summary (Last 24 hours) at 11/26/14 1135 Last data filed at 11/26/14 0900  Gross per 24 hour  Intake    240 ml  Output    275 ml  Net    -35 ml     Physical Exam: General:  elderly, frail, laying in the bed   HEENT  moist mucous membranes, anicteric sclera   Neck  supple, no masses   Pulm/lungs  mild basilar crackles, normal effort , [place] oxygen 2 Litres  CVS/Heart  regular rhythm   Abdomen:   soft, nontender, nondistended   Extremities: 1+ dependent edema   Neurologic:  alert, oriented, speech normal   Skin:  no acute rashes, skin breakdown over shins      GU  Foley in place     Basic Metabolic Panel:   Recent Labs Lab 11/21/14 0541 11/22/14 0926 11/23/14 0454 11/24/14 0543 11/25/14 0438  NA 136 136 138 139 139  K 4.9 4.6 4.2 4.1 4.4  CL 96* 95* 95* 95* 95*  CO2 34* 34* 36* 37* 37*  GLUCOSE 101* 168* 72 56* 62*  BUN 68* 68* 62* 62* 58*  CREATININE 2.09* 1.92* 1.65* 1.65* 1.64*  CALCIUM 8.1* 8.2* 8.3* 8.1* 8.4*  PHOS  --   --   --  3.9  --      CBC:  Recent Labs Lab 11/22/14 0518 11/25/14 0438  WBC 5.7 5.3  HGB 8.9* 8.5*  HCT 28.1* 27.5*  MCV 79.9* 80.9  PLT 223 218      Microbiology:  Recent Results (from the past 720 hour(s))  Blood Culture  (routine x 2)     Status: None   Collection Time: 11/09/14  2:34 PM  Result Value Ref Range Status   Specimen Description BLOOD RIGHT ARM  Final   Special Requests BOTTLES DRAWN AEROBIC AND ANAEROBIC 3CC  Final   Culture NO GROWTH 5 DAYS  Final   Report Status 11/14/2014 FINAL  Final  Urine culture     Status: None   Collection Time: 11/09/14  2:34 PM  Result Value Ref Range Status   Specimen Description URINE, CLEAN CATCH  Final   Special Requests NONE  Final   Culture   Final    >=100,000 COLONIES/mL ESCHERICHIA COLI ESBL-EXTENDED SPECTRUM BETA LACTAMASE-THE ORGANISM IS RESISTANT TO PENICILLINS, CEPHALOSPORINS AND AZTREONAM ACCORDING TO CLSI M100-S15 VOL.25 N01 JAN 2005. CRITICAL RESULT CALLED TO, READ BACK BY AND VERIFIED WITH: DANIEL JACOBS,RN 11/11/2014 0909 JRS.    Report Status 11/11/2014 FINAL  Final   Organism ID, Bacteria ESCHERICHIA COLI  Final      Susceptibility   Escherichia coli - MIC*    AMPICILLIN >=32 RESISTANT Resistant  CEFAZOLIN >=64 RESISTANT Resistant     CEFTRIAXONE >=64 RESISTANT Resistant     CIPROFLOXACIN >=4 RESISTANT Resistant     GENTAMICIN <=1 SENSITIVE Sensitive     IMIPENEM <=0.25 SENSITIVE Sensitive     NITROFURANTOIN <=16 SENSITIVE Sensitive     TRIMETH/SULFA >=320 RESISTANT Resistant     Extended ESBL POSITIVE Resistant     PIP/TAZO Value in next row Sensitive      SENSITIVE8    LEVOFLOXACIN Value in next row Resistant      RESISTANT>=8    * >=100,000 COLONIES/mL ESCHERICHIA COLI  MRSA PCR Screening     Status: None   Collection Time: 11/09/14  6:24 PM  Result Value Ref Range Status   MRSA by PCR NEGATIVE NEGATIVE Final    Comment:        The GeneXpert MRSA Assay (FDA approved for NASAL specimens only), is one component of a comprehensive MRSA colonization surveillance program. It is not intended to diagnose MRSA infection nor to guide or monitor treatment for MRSA infections.   MRSA PCR Screening     Status: None   Collection  Time: 11/21/14  2:04 PM  Result Value Ref Range Status   MRSA by PCR NEGATIVE NEGATIVE Final    Comment:        The GeneXpert MRSA Assay (FDA approved for NASAL specimens only), is one component of a comprehensive MRSA colonization surveillance program. It is not intended to diagnose MRSA infection nor to guide or monitor treatment for MRSA infections.   C difficile quick scan w PCR reflex     Status: Abnormal   Collection Time: 11/24/14 11:01 PM  Result Value Ref Range Status   C Diff antigen POSITIVE (A) NEGATIVE Final   C Diff toxin NEGATIVE NEGATIVE Final   C Diff interpretation   Final    Negative for toxigenic C. difficile. Toxin gene and active toxin production not detected. May be a nontoxigenic strain of C. difficile bacteria present, lacking the ability to produce toxin.  Clostridium Difficile by PCR     Status: None   Collection Time: 11/24/14 11:01 PM  Result Value Ref Range Status   Toxigenic C Difficile by pcr NEGATIVE NEGATIVE Final    Coagulation Studies: No results for input(s): LABPROT, INR in the last 72 hours.  Urinalysis: No results for input(s): COLORURINE, LABSPEC, PHURINE, GLUCOSEU, HGBUR, BILIRUBINUR, KETONESUR, PROTEINUR, UROBILINOGEN, NITRITE, LEUKOCYTESUR in the last 72 hours.  Invalid input(s): APPERANCEUR    Imaging: Dg Chest 2 View  11/25/2014   CLINICAL DATA:  Shortness of breath.  CHF.  EXAM: CHEST  2 VIEW  COMPARISON:  11/22/2014 chest radiograph.  FINDINGS: Right PICC terminates in the lower third of the superior vena cava. Median sternotomy wires are aligned and intact. No pneumothorax. Stable small right and moderate left pleural effusions. Stable mild pulmonary edema. Stable cardiomediastinal silhouette with mild to moderate cardiomegaly. Stable left greater than right bibasilar lung opacities, favor atelectasis.  IMPRESSION: 1. Stable mild congestive heart failure. 2. Stable small right and moderate left pleural effusions with associated  bibasilar lung opacities likely representing atelectasis.   Electronically Signed   By: Delbert Phenix M.D.   On: 11/25/2014 10:07     Medications:     . acidophilus  1 capsule Oral BID  . amiodarone  200 mg Oral Daily  . antiseptic oral rinse  7 mL Mouth Rinse BID  . apixaban  2.5 mg Oral BID  . atorvastatin  40 mg Oral q1800  .  docusate sodium  100 mg Oral BID  . feeding supplement  1 Container Oral TID WC  . furosemide  40 mg Intravenous 3 times per day  . gabapentin  300 mg Oral QHS  . insulin aspart  0-5 Units Subcutaneous QHS  . insulin aspart  0-9 Units Subcutaneous TID WC  . levothyroxine  100 mcg Oral QAC breakfast  . lubiprostone  24 mcg Oral BID WC  . metoprolol tartrate  12.5 mg Oral BID  . nystatin cream   Topical BID  . pantoprazole  40 mg Oral Daily  . sodium chloride  3 mL Intravenous Q12H   acetaminophen **OR** acetaminophen, albuterol, alum & mag hydroxide-simeth, HYDROcodone-acetaminophen, nitroGLYCERIN, ondansetron **OR** ondansetron (ZOFRAN) IV, polyethylene glycol, promethazine, simethicone, sodium chloride, zolpidem  Assessment/ Plan:  81 y.o. female with a PMHX of chronic systolic congestive heart failure, type 2 diabetes, hypertension, atrial fibrillation, DJD, COPD, coronary disease/CABG 1988, catheter 2015, recurrent UTIs, chronic kidney disease, who was admitted to [facility] on 11/09/2014 for evaluation of elevated creatinine above her baseline and increased swelling.   1. Acute renal failure on chronic kidney disease stage 4 . Patient's baseline creatinine is 1.66/GFR of 28. Creatinine now at baseline. CKD is likely secondary to atherosclerosis and hypertension.  Acute renal failure from acute cardiorenal syndrome. Improved with dobutamine, dopamine, albumin and furosemide. - Continue furosemide  IV  - Discontinued albumin. Now off dopamine and dobutamine - Appreciate cardiology input.   2. Hypertension and Anasarca/edema. From systolic congestive heart  failure acute exacerbation. - Continue furosemide as above.  - Low salt diet and fluid restriction.  - started on metoprolol. - Overall prognosis in guarded.     LOS: 17 [name] 10/9/201611:35 AM

## 2014-11-27 DIAGNOSIS — R0602 Shortness of breath: Secondary | ICD-10-CM

## 2014-11-27 DIAGNOSIS — N189 Chronic kidney disease, unspecified: Secondary | ICD-10-CM

## 2014-11-27 LAB — BASIC METABOLIC PANEL
Anion gap: 5 (ref 5–15)
BUN: 67 mg/dL — AB (ref 6–20)
CALCIUM: 8 mg/dL — AB (ref 8.9–10.3)
CO2: 36 mmol/L — AB (ref 22–32)
CREATININE: 2.05 mg/dL — AB (ref 0.44–1.00)
Chloride: 94 mmol/L — ABNORMAL LOW (ref 101–111)
GFR calc non Af Amer: 22 mL/min — ABNORMAL LOW (ref >60)
GFR, EST AFRICAN AMERICAN: 25 mL/min — AB (ref >60)
GLUCOSE: 167 mg/dL — AB (ref 65–99)
Potassium: 4.5 mmol/L (ref 3.5–5.1)
Sodium: 135 mmol/L (ref 135–145)

## 2014-11-27 LAB — GLUCOSE, CAPILLARY
GLUCOSE-CAPILLARY: 144 mg/dL — AB (ref 65–99)
GLUCOSE-CAPILLARY: 193 mg/dL — AB (ref 65–99)
Glucose-Capillary: 139 mg/dL — ABNORMAL HIGH (ref 65–99)
Glucose-Capillary: 171 mg/dL — ABNORMAL HIGH (ref 65–99)

## 2014-11-27 MED ORDER — FUROSEMIDE 10 MG/ML IJ SOLN: 40.0000 mg | Freq: Two times a day (BID) | INTRAMUSCULAR | Status: DC

## 2014-11-27 NOTE — Progress Notes (Signed)
Inpatient Diabetes Program Recommendations  AACE/ADA: New Consensus Statement on Inpatient Glycemic Control (2015)  Target Ranges:  Prepandial:   less than 140 mg/dL      Peak postprandial:   less than 180 mg/dL (1-2 hours)      Critically ill patients:  140 - 180 mg/dL  Results for [name], [name] (MRN [id number]) as of 11/27/2014 11:46  Ref. Range 11/26/2014 07:32 11/26/2014 11:53 11/26/2014 16:10 11/26/2014 21:22 11/27/2014 07:46  Glucose-Capillary Latest Ref Range: 65-99 mg/dL 440 (H) 347 (H) 425 (H) 253 (H) 139 (H)   Review of Glycemic Control  Current orders for Inpatient glycemic control: Novolog 0-9 TID with meals, Novolog 0-5 units HS  Inpatient Diabetes Program Recommendations: Diet: Noted post prandial glucose elevated and patient is ordered a Regular diet. Please consider changing diet to Carb Modified if appropriate.  Thanks, Orlando Penner, RN, MSN, CCRN, CDE Diabetes Coordinator Inpatient Diabetes Program [phone] (Team Pager from 8am to 5pm) [phone] ([facility] office) [phone] [facility] office) [phone] [facility] office)

## 2014-11-27 NOTE — Progress Notes (Signed)
Patient: [name] / Admit Date: 11/09/2014 / Date of Encounter: 11/27/2014, 3:11 PM   Subjective: Patient feels less short of breath, No leg edema. She does have abdominal bloating Reports having diarrhea for 3 days, 3 episodes per day. Heartburn symptoms, unable to eat/anorexia. Not drinking very much because of GI issues Significant nausea  Review of Systems: Review of Systems  Constitutional: Positive for malaise/fatigue. Negative for fever, weight loss and diaphoresis.  HENT: Negative for congestion.   Eyes: Negative for discharge and redness.  Respiratory: Positive for shortness of breath. Negative for cough, hemoptysis, sputum production and wheezing.   Cardiovascular: Negative.  Negative for chest pain, palpitations, orthopnea, claudication and PND.  Gastrointestinal: Positive for heartburn, nausea and diarrhea. Negative for vomiting.  Neurological: Positive for weakness. Negative for dizziness, tingling, tremors, sensory change, speech change, focal weakness and loss of consciousness.  Psychiatric/Behavioral: The patient is not nervous/anxious.     Objective: Telemetry:  P sinus bradycardia with rate 55 bpmhysical Exam: Blood pressure 120/58, pulse 56, temperature 97.5 F (36.4 C), temperature source Oral, resp. rate 14, height  (1.549 m), weight 180 lb 8.9 oz (81.9 kg), SpO2 100 %. Body mass index is 34.13 kg/(m^2). General: Well developed, well nourished, in no acute distress. Head: Normocephalic, atraumatic, sclera non-icteric, no xanthomas, nares are without discharge. Neck: Negative for carotid bruits. Improving JVP. Lungs: Clear bilaterally to auscultation without wheezes, rales, or rhonchi. Breathing is unlabored. Heart:  bradycardia,RRR S1 S2 without murmurs, rubs, or gallops.  Abdomen: Soft, non-tender, non-distended with normoactive bowel sounds. No rebound/guarding. Extremities: No clubbing or cyanosis.  noted leg edema Neuro: Alert and  oriented X 3. Moves all extremities spontaneously. Psych:  Responds to questions appropriately with a normal affect.   Intake/Output Summary (Last 24 hours) at 11/27/14 1511 Last data filed at 11/27/14 1300  Gross per 24 hour  Intake      0 ml  Output    750 ml  Net   -750 ml    Inpatient Medications:  . acidophilus  1 capsule Oral BID  . amiodarone  200 mg Oral Daily  . antiseptic oral rinse  7 mL Mouth Rinse BID  . apixaban  2.5 mg Oral BID  . atorvastatin  40 mg Oral q1800  . docusate sodium  100 mg Oral BID  . feeding supplement  1 Container Oral TID WC  . furosemide  40 mg Intravenous BID  . gabapentin  300 mg Oral QHS  . insulin aspart  0-5 Units Subcutaneous QHS  . insulin aspart  0-9 Units Subcutaneous TID WC  . levothyroxine  100 mcg Oral QAC breakfast  . lubiprostone  24 mcg Oral BID WC  . metoprolol tartrate  12.5 mg Oral BID  . nystatin cream   Topical BID  . pantoprazole  40 mg Oral Daily  . sodium chloride  3 mL Intravenous Q12H   Infusions:     Labs:  Recent Labs  11/25/14 0438 11/27/14 0600  NA 139 135  K 4.4 4.5  CL 95* 94*  CO2 37* 36*  GLUCOSE 62* 167*  BUN 58* 67*  CREATININE 1.64* 2.05*  CALCIUM 8.4* 8.0*   No results for input(s): AST, ALT, ALKPHOS, BILITOT, PROT, ALBUMIN in the last 72 hours.  Recent Labs  11/25/14 0438  WBC 5.3  HGB 8.5*  HCT 27.5*  MCV 80.9  PLT 218   No results for input(s): CKTOTAL, CKMB, TROPONINI in the last 72 hours. Invalid input(s):  POCBNP No results for input(s): HGBA1C in the last 72 hours.   Weights: Filed Weights   11/25/14 1509 11/26/14 0527 11/27/14 0500  Weight: 179 lb (81.194 kg) 180 lb 8.9 oz (81.9 kg) 180 lb 8.9 oz (81.9 kg)     Radiology/Studies:  Dg Chest 1 View  11/22/2014   CLINICAL DATA:  Hypoxia  EXAM: CHEST 1 VIEW  COMPARISON:  11/15/2014  FINDINGS: Prior CABG. Cardiomegaly with vascular congestion and bilateral airspace opacities in the perihilar and lower lobe regions, most  compatible with edema/ CHF. Suspect small layering effusions.  IMPRESSION: Mild to moderate CHF.  Suspect small layering effusions.   Electronically Signed   By: Charlett Nose M.D.   On: 11/22/2014 08:35   Dg Chest 1 View  11/12/2014   CLINICAL DATA:  PICC line placement  EXAM: CHEST 1 VIEW  COMPARISON:  11/09/2014  FINDINGS: Right PICC line is in place with the tip at the cavoatrial junction. Prior CABG. Cardiomegaly. Concern for bilateral airspace opacities, new since prior study. This could represent edema. No visible effusions.  IMPRESSION: Right PICC line tip at the cavoatrial junction.  Cardiomegaly. New diffuse bilateral airspace disease concerning for edema/CHF.   Electronically Signed   By: Charlett Nose M.D.   On: 11/12/2014 14:28   Dg Chest 2 View  11/25/2014   CLINICAL DATA:  Shortness of breath.  CHF.  EXAM: CHEST  2 VIEW  COMPARISON:  11/22/2014 chest radiograph.  FINDINGS: Right PICC terminates in the lower third of the superior vena cava. Median sternotomy wires are aligned and intact. No pneumothorax. Stable small right and moderate left pleural effusions. Stable mild pulmonary edema. Stable cardiomediastinal silhouette with mild to moderate cardiomegaly. Stable left greater than right bibasilar lung opacities, favor atelectasis.  IMPRESSION: 1. Stable mild congestive heart failure. 2. Stable small right and moderate left pleural effusions with associated bibasilar lung opacities likely representing atelectasis.   Electronically Signed   By: Delbert Phenix M.D.   On: 11/25/2014 10:07   US Renal  11/11/2014   CLINICAL DATA:  Acute renal failure  EXAM: RENAL / URINARY TRACT ULTRASOUND COMPLETE  COMPARISON:  CT 10/20/2014  FINDINGS: Right Kidney:  Length: 10.8 cm. Slightly increased echotexture. No mass or hydronephrosis visualized.  Left Kidney:  Length: 8.9 cm. Increased echotexture. No mass or hydronephrosis visualized.  Bladder:  Appears normal for degree of bladder distention.  Ascites noted  in the abdomen adjacent to the liver and spleen as seen on recent CT  IMPRESSION: Increased echotexture within the kidneys bilaterally suggesting chronic medical renal disease. No hydronephrosis.  Ascites.   Electronically Signed   By: Charlett Nose M.D.   On: 11/11/2014 09:48   Dg Chest Port 1 View  11/15/2014   CLINICAL DATA:  Cough and congestion, onset this evening.  EXAM: PORTABLE CHEST 1 VIEW  COMPARISON:  Chest radiograph 11/12/2014  FINDINGS: Tip of the right upper extremity PICC in the distal SVC. Patient is post median sternotomy. There is stable cardiomegaly. Diminished bilateral airspace disease from prior exam, likely improved edema. Mild right infrahilar and left suprahilar atelectasis. Limited left basilar assessment scenario soft tissue attenuation. Chronic deformity about the left proximal humerus, unchanged.  IMPRESSION: 1. Findings suggest resolving pulmonary edema.  Stable cardiomegaly. 2. Tip of the right upper extremity PICC in the distal SVC.   Electronically Signed   By: Rubye Oaks M.D.   On: 11/15/2014 21:58   Dg Chest Port 1 View  11/09/2014   CLINICAL  DATA:  Sepsis.  Hypoxia.  EXAM: PORTABLE CHEST 1 VIEW  COMPARISON:  October 18, 2014.  FINDINGS: Stable cardiomegaly. Status post coronary artery bypass graft. No pneumothorax is noted. Left basilar opacity is noted concerning for pneumonia or atelectasis with associated pleural effusion. Mild right basilar opacity is noted concerning for subsegmental atelectasis or mild pleural effusion. Bony thorax is unremarkable.  IMPRESSION: Left basilar opacity is noted concerning for pneumonia or atelectasis with associated pleural effusion. Mild right basilar opacity is noted concerning for subsegmental atelectasis or pleural effusion.   Electronically Signed   By: Lupita Raider, M.D.   On: 11/09/2014 14:58     Assessment and Plan  81 y.o. female with h/o CAD s/p CABG s/p PCI on SVGs, ischemic cardiomyopathy/chronic systolic CHF, PAF  on Eliquis, PVD, COPD requiring nocturnal oxygen, CKD stage III, IDDM, HLD, and HTN who has been admitted recently to [facility] in mid June and mid July for ESBL UTI, HCAP and acute on chronic systolic CHF, mid August for ileus, and late August for HCAP again who presented to [facility] on 9/22 with from [facility] with acute on chronic CKD. Cardiology was consulted on 10/4 with acute on chronic systolic CHF in the setting of newly depressed EF of 20-25% by echo on 11/14/2014.  1. Acute on chronic systolic CHF: Ejection fraction 20-25%,   previously treated with pressors with improvement of her renal function Now with diarrhea, anorexia, climb in her creatinine --- Could hold Lasix IV until intake improves, currently minimal  2. Acute on CKD stage IV:  worse in the past 24 hours, possibly in the setting of diarrhea, low oral fluid intake Would discuss with renal, consider holding Lasix  3. PAF: -Currently in sinus rhythm on carvedilol, amiodarone -Eliquis 2.5 mg bid -CHADSVASc at least 7 (CHF, HTN, DM, vascular disease, age x 2, female)  4. CAD s/p CABG s/p PCI as above: -No angina. Severe disease on cardiac catheterization in 2015  - Given multiple comorbidities, she is not a good candidate for further ischemic workup. -On Eliquis in place of aspirin  -Coreg  -Lipitor 40 mg   5. DM2: -SSI per IM  Signed, Dossie Arbour, MD  [facility] 11/27/2014, 3:11 PM

## 2014-11-27 NOTE — Care Management Important Message (Signed)
Important Message  Patient Details  Name: [name] MRN: [id number] Date of Birth: January 05, 1932   Medicare Important Message Given:  Yes-fourth notification given    Fatmata Legere A, RN 11/27/2014, 9:05 AM

## 2014-11-27 NOTE — Progress Notes (Addendum)
Palliative Care Update  Pt did not get a HCPOA / Living Will done over the weekend.  She has become a little more confused at times --but this comes and goes.  Yesterday, she had some lucid hours and this am she was quite confused.  I met with pt's granddaughter, [name], who is the person who is most involved in pts life and care; as well as pt's brother, 'J C' .    They had details provided about her ischemic cardiomyopathy, worsening renal function, infections, etc.  Granddaughter reported that pt had always said she would not want a feeding tube or be on a ventilator.  We talked about many matters.  Then we went back to see what kind of mental state the pt was in. She was well oriented and remembered me and even recounted some parts of my conversation with her that I myself had forgotten. This prompted me to bring up Code Status and a few other issues while granddaghter and brother were present and also while pt is able to make her own health care decisions.  Pt would like to be DNR and to NOT be on a ventilator or a feeding tube.    She does think dialysis is a good idea for her if she can tolerate it.  She thinks this might give her more time here [place] the [place] while the other things like CPR, a vent, and a feeding tube would not be able to give her any quality days.    The portable DNR form is now completed and in the chart.    I will try to arrange for HCPOA and Living Will but for now, pt is too exhausted to do any more than what we accomplished today.    I have updated Dr. Darvin Neighbours.  On exam she is weak, pale, but oriented.  Skin is w/o mottling or cyanosis.   I don't see pt being able to tolerate rehab/ Pt in this condition, so her hopes for rehab may be questionable. She looks like she is barely here with [place] --she is so very weak and pale.    Colleen Can, MD

## 2014-11-27 NOTE — Progress Notes (Signed)
PT Cancellation Note  Patient Details Name: [name] MRN: [id number] DOB: Jun 29, 1931   Cancelled Treatment:    Reason Eval/Treat Not Completed: Medical issues which prohibited therapy. Treatment attempted. MD in room and requested PT to wait. Later MD spoke with PT and stated patient needs to be held from PT at this time, as she is too weak/ill to tolerate. Re attempt PT tomorrow if appropriate.    Elsie Stain Bishop 11/27/2014, 2:11 PM

## 2014-11-27 NOTE — Progress Notes (Signed)
Subjective:  Renal function worse today.  UOP 750cc over the past 24 hours. Reviewed Dr. Windell Hummingbird note.  Objective:  Vital signs in last 24 hours:  Temp:  [97.5 F (36.4 C)-98 F (36.7 C)] 97.5 F (36.4 C) (10/10 1157) Pulse Rate:  [55-63] 56 (10/10 1157) Resp:  [14-18] 14 (10/10 1157) BP: (112-127)/(57-66) 120/58 mmHg (10/10 1157) SpO2:  [96 %-100 %] 100 % (10/10 1157) Weight:  [81.9 kg (180 lb 8.9 oz)] 81.9 kg (180 lb 8.9 oz) (10/10 0500)  Weight change: 0.706 kg (1 lb 8.9 oz) Filed Weights   11/25/14 1509 11/26/14 0527 11/27/14 0500  Weight: 81.194 kg (179 lb) 81.9 kg (180 lb 8.9 oz) 81.9 kg (180 lb 8.9 oz)    Intake/Output:    Intake/Output Summary (Last 24 hours) at 11/27/14 1609 Last data filed at 11/27/14 1400  Gross per 24 hour  Intake      0 ml  Output    750 ml  Net   -750 ml     Physical Exam: General:  elderly, frail, NAD  HEENT  moist mucous membranes, anicteric sclera   Neck  supple   Pulm/lungs  mild basilar crackles, normal effort  CVS/Heart  S1S2 no rubs  Abdomen:   soft, nontender, nondistended   Extremities: 1+ dependent edema   Neurologic:  alert, oriented, speech normal   Skin:  no acute rashes, skin breakdown over shins      GU  Foley in place     Basic Metabolic Panel:   Recent Labs Lab 11/22/14 0926 11/23/14 0454 11/24/14 0543 11/25/14 0438 11/27/14 0600  NA 136 138 139 139 135  K 4.6 4.2 4.1 4.4 4.5  CL 95* 95* 95* 95* 94*  CO2 34* 36* 37* 37* 36*  GLUCOSE 168* 72 56* 62* 167*  BUN 68* 62* 62* 58* 67*  CREATININE 1.92* 1.65* 1.65* 1.64* 2.05*  CALCIUM 8.2* 8.3* 8.1* 8.4* 8.0*  PHOS  --   --  3.9  --   --      CBC:  Recent Labs Lab 11/22/14 0518 11/25/14 0438  WBC 5.7 5.3  HGB 8.9* 8.5*  HCT 28.1* 27.5*  MCV 79.9* 80.9  PLT 223 218      Microbiology:  Recent Results (from the past 720 hour(s))  Blood Culture (routine x 2)     Status: None   Collection Time: 11/09/14  2:34 PM  Result Value Ref Range  Status   Specimen Description BLOOD RIGHT ARM  Final   Special Requests BOTTLES DRAWN AEROBIC AND ANAEROBIC 3CC  Final   Culture NO GROWTH 5 DAYS  Final   Report Status 11/14/2014 FINAL  Final  Urine culture     Status: None   Collection Time: 11/09/14  2:34 PM  Result Value Ref Range Status   Specimen Description URINE, CLEAN CATCH  Final   Special Requests NONE  Final   Culture   Final    >=100,000 COLONIES/mL ESCHERICHIA COLI ESBL-EXTENDED SPECTRUM BETA LACTAMASE-THE ORGANISM IS RESISTANT TO PENICILLINS, CEPHALOSPORINS AND AZTREONAM ACCORDING TO CLSI M100-S15 VOL.25 N01 JAN 2005. CRITICAL RESULT CALLED TO, READ BACK BY AND VERIFIED WITH: DANIEL JACOBS,RN 11/11/2014 0909 JRS.    Report Status 11/11/2014 FINAL  Final   Organism ID, Bacteria ESCHERICHIA COLI  Final      Susceptibility   Escherichia coli - MIC*    AMPICILLIN >=32 RESISTANT Resistant     CEFAZOLIN >=64 RESISTANT Resistant     CEFTRIAXONE >=64 RESISTANT Resistant  CIPROFLOXACIN >=4 RESISTANT Resistant     GENTAMICIN <=1 SENSITIVE Sensitive     IMIPENEM <=0.25 SENSITIVE Sensitive     NITROFURANTOIN <=16 SENSITIVE Sensitive     TRIMETH/SULFA >=320 RESISTANT Resistant     Extended ESBL POSITIVE Resistant     PIP/TAZO Value in next row Sensitive      SENSITIVE8    LEVOFLOXACIN Value in next row Resistant      RESISTANT>=8    * >=100,000 COLONIES/mL ESCHERICHIA COLI  MRSA PCR Screening     Status: None   Collection Time: 11/09/14  6:24 PM  Result Value Ref Range Status   MRSA by PCR NEGATIVE NEGATIVE Final    Comment:        The GeneXpert MRSA Assay (FDA approved for NASAL specimens only), is one component of a comprehensive MRSA colonization surveillance program. It is not intended to diagnose MRSA infection nor to guide or monitor treatment for MRSA infections.   MRSA PCR Screening     Status: None   Collection Time: 11/21/14  2:04 PM  Result Value Ref Range Status   MRSA by PCR NEGATIVE NEGATIVE Final     Comment:        The GeneXpert MRSA Assay (FDA approved for NASAL specimens only), is one component of a comprehensive MRSA colonization surveillance program. It is not intended to diagnose MRSA infection nor to guide or monitor treatment for MRSA infections.   C difficile quick scan w PCR reflex     Status: Abnormal   Collection Time: 11/24/14 11:01 PM  Result Value Ref Range Status   C Diff antigen POSITIVE (A) NEGATIVE Final   C Diff toxin NEGATIVE NEGATIVE Final   C Diff interpretation   Final    Negative for toxigenic C. difficile. Toxin gene and active toxin production not detected. May be a nontoxigenic strain of C. difficile bacteria present, lacking the ability to produce toxin.  Clostridium Difficile by PCR     Status: None   Collection Time: 11/24/14 11:01 PM  Result Value Ref Range Status   Toxigenic C Difficile by pcr NEGATIVE NEGATIVE Final    Coagulation Studies: No results for input(s): LABPROT, INR in the last 72 hours.  Urinalysis: No results for input(s): COLORURINE, LABSPEC, PHURINE, GLUCOSEU, HGBUR, BILIRUBINUR, KETONESUR, PROTEINUR, UROBILINOGEN, NITRITE, LEUKOCYTESUR in the last 72 hours.  Invalid input(s): APPERANCEUR    Imaging: No results found.   Medications:     . acidophilus  1 capsule Oral BID  . amiodarone  200 mg Oral Daily  . antiseptic oral rinse  7 mL Mouth Rinse BID  . apixaban  2.5 mg Oral BID  . atorvastatin  40 mg Oral q1800  . docusate sodium  100 mg Oral BID  . feeding supplement  1 Container Oral TID WC  . furosemide  40 mg Intravenous BID  . gabapentin  300 mg Oral QHS  . insulin aspart  0-5 Units Subcutaneous QHS  . insulin aspart  0-9 Units Subcutaneous TID WC  . levothyroxine  100 mcg Oral QAC breakfast  . lubiprostone  24 mcg Oral BID WC  . metoprolol tartrate  12.5 mg Oral BID  . nystatin cream   Topical BID  . pantoprazole  40 mg Oral Daily  . sodium chloride  3 mL Intravenous Q12H   acetaminophen **OR**  acetaminophen, albuterol, alum & mag hydroxide-simeth, HYDROcodone-acetaminophen, nitroGLYCERIN, ondansetron **OR** ondansetron (ZOFRAN) IV, polyethylene glycol, promethazine, simethicone, zolpidem  Assessment/ Plan:  81 y.o. female with a  PMHX of chronic systolic congestive heart failure, type 2 diabetes, hypertension, atrial fibrillation, DJD, COPD, coronary disease/CABG 1988, catheter 2015, recurrent UTIs, chronic kidney disease, who was admitted to [facility] on 11/09/2014 for evaluation of elevated creatinine above her baseline and increased swelling.   1. Acute renal failure on chronic kidney disease stage 4 . Patient's baseline creatinine is 1.66/GFR of 28. CKD is likely secondary to atherosclerosis and hypertension.  Acute renal failure from acute cardiorenal syndrome. Had with dobutamine, dopamine, albumin and furosemide. - renal function worse today, reviewed Dr. Windell Hummingbird note, he has recommeneded holding lasix for now.  Coming off pressors also likely leading to decreased renal perfusion.  2. Hypertension and Anasarca/edema. From systolic congestive heart failure acute exacerbation. - as above, holding lasix for now given worsening renal function, if renal function continues to worsen and heart failure comes back may need to go back on ionotropic meds.      LOS: 18 [name] 10/10/20164:09 PM

## 2014-11-27 NOTE — Progress Notes (Signed)
[facility] [place] [facility]   PATIENT NAME: [name]    MR#:  [id number]  DATE OF BIRTH:  06/03/1931  SUBJECTIVE:   Continues to be SOB. Off dobutamine drip.  REVIEW OF SYSTEMS:    Review of Systems  Constitutional: Negative for fever, chills and malaise/fatigue.  HENT: Negative for sore throat.   Eyes: Negative for blurred vision.  Respiratory: Negative for cough, hemoptysis, shortness of breath and wheezing.   Cardiovascular: Positive for leg swelling. Negative for chest pain and palpitations.  Gastrointestinal: Negative for nausea, vomiting, abdominal pain, diarrhea and blood in stool.  Genitourinary: Negative for dysuria.  Musculoskeletal: Negative for back pain.  Neurological: Negative for dizziness, tremors and headaches.  Endo/Heme/Allergies: Does not bruise/bleed easily.    Tolerating Diet: Yes DRUG ALLERGIES:   Allergies  Allergen Reactions  . Contrast Media [Iodinated Diagnostic Agents] Shortness Of Breath  . Tramadol Nausea Only  . Valium [Diazepam] Other (See Comments)    Reaction:  Elevated heart rate  . Iodine Strong [Iodine] Rash  . Penicillins Rash   VITALS:  Blood pressure 127/65, pulse 55, temperature 98 F (36.7 C), temperature source Oral, resp. rate 18, height  (1.549 m), weight 81.5 kg (179 lb 10.8 oz), SpO2 99 %. PHYSICAL EXAMINATION:   Physical Exam   GENERAL:  81 y.o.-year-old patient lying in the bed with no acute distress.  EYES: Pupils equal, round, reactive to light and accommodation. No scleral icterus. Extraocular muscles intact.  HEENT: Head atraumatic, normocephalic. Oropharynx and nasopharynx clear.  NECK:  Supple, no jugular venous distention. No thyroid enlargement, no tenderness.  LUNGS: Normal work of breathing. Poor air entry bilaterally. Crackles left base. CARDIOVASCULAR: S1, S2 normal. No murmurs, rubs, or gallops.  ABDOMEN: Soft, nontender, nondistended. Bowel sounds present.  No organomegaly or mass.  EXTREMITIES: No cyanosis, clubbing.    NEUROLOGIC: Cranial nerves II through XII are intact. No focal Motor or sensory deficits b/l.   PSYCHIATRIC: The patient is alert and oriented x 3.  SKIN: No obvious rash, lesion, or ulcer.  Generalized anasarca.  LABORATORY PANEL:   CBC  Recent Labs Lab 11/25/14 0438  WBC 5.3  HGB 8.5*  HCT 27.5*  PLT 218   ------------------------------------------------------------------------------------------------------------------  Chemistries   Recent Labs Lab 11/27/14 0600  NA 135  K 4.5  CL 94*  CO2 36*  GLUCOSE 167*  BUN 67*  CREATININE 2.05*  CALCIUM 8.0*    ASSESSMENT AND PLAN:   81 year old female with a history of chronic systolic heart failure, DJD with functional quadriplegia, type 2 diabetes and hypertension who presented with elevation in her creatinine and edema.  * Acute on chronic systolic heart failure with anasarca: Her echocardiogram showed EF of 20% with hypokinesis of several areas. 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 Apreciate cardio input.  Reduce Lasix  IV BID from TID Monitor input and output. Needs further inpatient diuresis  * Chronic respiratory failure is stable  * Acute on CKD 4 Patient's creatinine had improved but now with diuresis Cr is worse today. Discussed with Dr. Cherylann Ratel. Reduce lasix dose. Off lasix and dobutamine drip. - Discontinued albumin.   * ESBL urinary tract infection:  on Invanz which was started on September 24. She has PICC line and will need 10 days of antibiotics. Up till 11/21/14- stopped now.  * Anemia of chronic disease Stable No need for transfusion  * CAD status post CABG: Patient is not on ACE inhibitor/ARB due to hypotension and acute renal failure.   *  Atrial fibrillation: heart rate is controlled. Patient will continue on Eliquis and amiodarone.  * Diabetes type 2: Continue Lantus and sliding scale insulin.  * Hypothyroidism   On  Levothyroxin  She lives at [facility] and will likely need rehab on D/C  Management plans discussed with the patient and she is in agreement.  CODE STATUS: Full  TOTAL TIME TAKING CARE OF THIS PATIENT: 35 minutes.    Milagros Loll R M.D on 11/27/2014 at 11:51 AM  Between 7am to 6pm - Pager - [phone] After 6pm go to [email or web address] - password [email or web address] [facility]  [facility]  Office  [phone]  CC: Primary care physician; Fidel Levy, MD

## 2014-11-28 DIAGNOSIS — R531 Weakness: Secondary | ICD-10-CM

## 2014-11-28 LAB — CBC
HCT: 29.7 % — ABNORMAL LOW (ref 35.0–47.0)
HEMOGLOBIN: 9 g/dL — AB (ref 12.0–16.0)
MCH: 24.5 pg — AB (ref 26.0–34.0)
MCHC: 30.2 g/dL — AB (ref 32.0–36.0)
MCV: 81.2 fL (ref 80.0–100.0)
PLATELETS: 238 10*3/uL (ref 150–440)
RBC: 3.66 MIL/uL — AB (ref 3.80–5.20)
RDW: 27.1 % — ABNORMAL HIGH (ref 11.5–14.5)
WBC: 4.7 10*3/uL (ref 3.6–11.0)

## 2014-11-28 LAB — BASIC METABOLIC PANEL
Anion gap: 11 (ref 5–15)
BUN: 70 mg/dL — AB (ref 6–20)
CHLORIDE: 90 mmol/L — AB (ref 101–111)
CO2: 34 mmol/L — ABNORMAL HIGH (ref 22–32)
CREATININE: 2.12 mg/dL — AB (ref 0.44–1.00)
Calcium: 8.2 mg/dL — ABNORMAL LOW (ref 8.9–10.3)
GFR calc Af Amer: 24 mL/min — ABNORMAL LOW (ref >60)
GFR calc non Af Amer: 21 mL/min — ABNORMAL LOW (ref >60)
GLUCOSE: 145 mg/dL — AB (ref 65–99)
POTASSIUM: 4.8 mmol/L (ref 3.5–5.1)
SODIUM: 135 mmol/L (ref 135–145)

## 2014-11-28 LAB — GLUCOSE, CAPILLARY
GLUCOSE-CAPILLARY: 250 mg/dL — AB (ref 65–99)
Glucose-Capillary: 162 mg/dL — ABNORMAL HIGH (ref 65–99)
Glucose-Capillary: 176 mg/dL — ABNORMAL HIGH (ref 65–99)
Glucose-Capillary: 186 mg/dL — ABNORMAL HIGH (ref 65–99)

## 2014-11-28 MED ORDER — LOPERAMIDE HCL 2 MG PO CAPS: 2.0000 mg | ORAL_CAPSULE | Freq: Four times a day (QID) | ORAL | Status: DC | PRN

## 2014-11-28 NOTE — Progress Notes (Signed)
OT Cancellation Note  Patient Details Name: [name] MRN: [id number] DOB: [birth year]   Cancelled Treatment:    Reason Eval/Treat Not Completed: Patient declined, no reason specified. Patient declined stating she is too tired.  Ocie Cornfield 11/28/2014, 5:01 PM

## 2014-11-28 NOTE — Progress Notes (Signed)
Patient: [name] / Admit Date: 11/09/2014 / Date of Encounter: 11/28/2014, 9:04 AM   Subjective: Patient feels less short of breath, Pitting edema around her Buttock, back area No diarrhea last night Reports having diarrhea for 4 days Nausea better, still not eating, drinking some water  feels very weak, has not been out of bed   Review of Systems: Review of Systems  Constitutional: Positive for malaise/fatigue. Negative for fever, weight loss and diaphoresis.       Anorexia  HENT: Negative for congestion.   Eyes: Negative for discharge and redness.  Respiratory: Negative.  Negative for cough, hemoptysis, sputum production and wheezing.   Cardiovascular: Negative.  Negative for chest pain, palpitations, orthopnea, claudication and PND.  Gastrointestinal: Negative.  Negative for vomiting.  Neurological: Positive for weakness. Negative for dizziness, tingling, tremors, sensory change, speech change, focal weakness and loss of consciousness.  Psychiatric/Behavioral: The patient is not nervous/anxious.     Objective: Telemetry: sinus bradycardia Physical Exam: Blood pressure 118/53, pulse 54, temperature 98.2 F (36.8 C), temperature source Oral, resp. rate 18, height 5\' 1"  (1.549 m), weight 178 lb 12.8 oz (81.103 kg), SpO2 100 %. Body mass index is 33.8 kg/(m^2). General: Well developed, well nourished, in no acute distress. Head: Normocephalic, atraumatic, sclera non-icteric, no xanthomas, nares are without discharge. Neck: Negative for carotid bruits. Improving JVP. Lungs: Clear bilaterally to auscultation without wheezes, rales, or rhonchi. Breathing is unlabored. Heart:  bradycardia,RRR S1 S2 without murmurs, rubs, or gallops.  Abdomen: Soft, non-tender, non-distended with normoactive bowel sounds. No rebound/guarding. Extremities: No clubbing or cyanosis.  noted leg edema around her buttock, lower back, flank area Neuro: Alert and oriented X 3. Moves all  extremities spontaneously. Psych:  Responds to questions appropriately with a normal affect.   Intake/Output Summary (Last 24 hours) at 11/28/14 0904 Last data filed at 11/28/14 0500  Gross per 24 hour  Intake      3 ml  Output    375 ml  Net   -372 ml    Inpatient Medications:  . acidophilus  1 capsule Oral BID  . amiodarone  200 mg Oral Daily  . antiseptic oral rinse  7 mL Mouth Rinse BID  . apixaban  2.5 mg Oral BID  . atorvastatin  40 mg Oral q1800  . docusate sodium  100 mg Oral BID  . feeding supplement  1 Container Oral TID WC  . gabapentin  300 mg Oral QHS  . insulin aspart  0-5 Units Subcutaneous QHS  . insulin aspart  0-9 Units Subcutaneous TID WC  . levothyroxine  100 mcg Oral QAC breakfast  . lubiprostone  24 mcg Oral BID WC  . metoprolol tartrate  12.5 mg Oral BID  . nystatin cream   Topical BID  . pantoprazole  40 mg Oral Daily  . sodium chloride  3 mL Intravenous Q12H   Infusions:     Labs:  Recent Labs  11/27/14 0600 11/28/14 0525  NA 135 135  K 4.5 4.8  CL 94* 90*  CO2 36* 34*  GLUCOSE 167* 145*  BUN 67* 70*  CREATININE 2.05* 2.12*  CALCIUM 8.0* 8.2*   No results for input(s): AST, ALT, ALKPHOS, BILITOT, PROT, ALBUMIN in the last 72 hours.  Recent Labs  11/28/14 0525  WBC 4.7  HGB 9.0*  HCT 29.7*  MCV 81.2  PLT 238   No results for input(s): CKTOTAL, CKMB, TROPONINI in the last 72 hours. Invalid input(s): POCBNP No results  for input(s): HGBA1C in the last 72 hours.   Weights: Filed Weights   11/26/14 0527 11/27/14 0500 11/28/14 0500  Weight: 180 lb 8.9 oz (81.9 kg) 180 lb 8.9 oz (81.9 kg) 178 lb 12.8 oz (81.103 kg)     Radiology/Studies:  Dg Chest 1 View  11/22/2014   CLINICAL DATA:  Hypoxia  EXAM: CHEST 1 VIEW  COMPARISON:  11/15/2014  FINDINGS: Prior CABG. Cardiomegaly with vascular congestion and bilateral airspace opacities in the perihilar and lower lobe regions, most compatible with edema/ CHF. Suspect small layering  effusions.  IMPRESSION: Mild to moderate CHF.  Suspect small layering effusions.   Electronically Signed   By: Charlett Nose M.D.   On: 11/22/2014 08:35   Dg Chest 1 View  11/12/2014   CLINICAL DATA:  PICC line placement  EXAM: CHEST 1 VIEW  COMPARISON:  11/09/2014  FINDINGS: Right PICC line is in place with the tip at the cavoatrial junction. Prior CABG. Cardiomegaly. Concern for bilateral airspace opacities, new since prior study. This could represent edema. No visible effusions.  IMPRESSION: Right PICC line tip at the cavoatrial junction.  Cardiomegaly. New diffuse bilateral airspace disease concerning for edema/CHF.   Electronically Signed   By: Charlett Nose M.D.   On: 11/12/2014 14:28   Dg Chest 2 View  11/25/2014   CLINICAL DATA:  Shortness of breath.  CHF.  EXAM: CHEST  2 VIEW  COMPARISON:  11/22/2014 chest radiograph.  FINDINGS: Right PICC terminates in the lower third of the superior vena cava. Median sternotomy wires are aligned and intact. No pneumothorax. Stable small right and moderate left pleural effusions. Stable mild pulmonary edema. Stable cardiomediastinal silhouette with mild to moderate cardiomegaly. Stable left greater than right bibasilar lung opacities, favor atelectasis.  IMPRESSION: 1. Stable mild congestive heart failure. 2. Stable small right and moderate left pleural effusions with associated bibasilar lung opacities likely representing atelectasis.   Electronically Signed   By: Delbert Phenix M.D.   On: 11/25/2014 10:07   US Renal  11/11/2014   CLINICAL DATA:  Acute renal failure  EXAM: RENAL / URINARY TRACT ULTRASOUND COMPLETE  COMPARISON:  CT 10/20/2014  FINDINGS: Right Kidney:  Length: 10.8 cm. Slightly increased echotexture. No mass or hydronephrosis visualized.  Left Kidney:  Length: 8.9 cm. Increased echotexture. No mass or hydronephrosis visualized.  Bladder:  Appears normal for degree of bladder distention.  Ascites noted in the abdomen adjacent to the liver and spleen as  seen on recent CT  IMPRESSION: Increased echotexture within the kidneys bilaterally suggesting chronic medical renal disease. No hydronephrosis.  Ascites.   Electronically Signed   By: Charlett Nose M.D.   On: 11/11/2014 09:48   Dg Chest Port 1 View  11/15/2014   CLINICAL DATA:  Cough and congestion, onset this evening.  EXAM: PORTABLE CHEST 1 VIEW  COMPARISON:  Chest radiograph 11/12/2014  FINDINGS: Tip of the right upper extremity PICC in the distal SVC. Patient is post median sternotomy. There is stable cardiomegaly. Diminished bilateral airspace disease from prior exam, likely improved edema. Mild right infrahilar and left suprahilar atelectasis. Limited left basilar assessment scenario soft tissue attenuation. Chronic deformity about the left proximal humerus, unchanged.  IMPRESSION: 1. Findings suggest resolving pulmonary edema.  Stable cardiomegaly. 2. Tip of the right upper extremity PICC in the distal SVC.   Electronically Signed   By: Rubye Oaks M.D.   On: 11/15/2014 21:58   Dg Chest Port 1 View  11/09/2014   CLINICAL DATA:  Sepsis.  Hypoxia.  EXAM: PORTABLE CHEST 1 VIEW  COMPARISON:  October 18, 2014.  FINDINGS: Stable cardiomegaly. Status post coronary artery bypass graft. No pneumothorax is noted. Left basilar opacity is noted concerning for pneumonia or atelectasis with associated pleural effusion. Mild right basilar opacity is noted concerning for subsegmental atelectasis or mild pleural effusion. Bony thorax is unremarkable.  IMPRESSION: Left basilar opacity is noted concerning for pneumonia or atelectasis with associated pleural effusion. Mild right basilar opacity is noted concerning for subsegmental atelectasis or pleural effusion.   Electronically Signed   By: Lupita Raider, M.D.   On: 11/09/2014 14:58     Assessment and Plan  81 y.o. female with h/o CAD s/p CABG s/p PCI on SVGs, ischemic cardiomyopathy/chronic systolic CHF, PAF on Eliquis, PVD, COPD requiring nocturnal oxygen,  CKD stage III, IDDM, HLD, and HTN who has been admitted recently to [facility] in mid June and mid July for ESBL UTI, HCAP and acute on chronic systolic CHF, mid August for ileus, and late August for HCAP again who presented to [facility] on 9/22 with from [facility] with acute on chronic CKD. Cardiology was consulted on 10/4 with acute on chronic systolic CHF in the setting of newly depressed EF of 20-25% by echo on 11/14/2014.  1. Acute on chronic systolic CHF: Ejection fraction 20-25%,   previously treated with pressors with improvement of her renal function and with diuresis several days of diarrhea, anorexia, climb in her creatinine --- does not appear to be grossly fluid overloaded, possible dependent edema of legs and back, flanks. We do not have a good means of measuring central venous pressure Echocardiogram approximately 2 weeks ago with only mildly elevated right heart pressures. ----Not a good candidate for bridging therapy to more advanced cardiac support such as LVAD --- Certainly placing her back on pressors may improve her renal function though this would again be a temporary fix. ------ as she is not symptomatic with shortness of breath and tremendously deconditioned, could treat very conservatively, work on her conditioning. Currently appears to be bedbound. --My thought would be with such limited oral fluid intake and food intake over the past several days, worsening renal function, coming off a period of aggressive inotropes and diuretics, recent diarrhea, to be conservative and hold her Lasix for now until she declares herself one way or the other.    2. Acute on CKD stage IV:  worse in the past 24 hours, possibly in the setting of diarrhea, low oral fluid int Will defer to renal, consider holding Lasix for now as she is symptomatically doing well, feels better than when she first arrived in terms of her heart failure ATN?  3. PAF: -Currently in sinus rhythm on carvedilol,  amiodarone -Eliquis 2.5 mg bid -CHADSVASc at least 7 (CHF, HTN, DM, vascular disease, age x 2, female)  4. CAD s/p CABG s/p PCI as above: -No angina. Severe disease on cardiac catheterization in 2015  - Given multiple comorbidities, she is not a good candidate for further ischemic workup. -On Eliquis in place of aspirin  -Coreg  -Lipitor 40 mg   5. DM2: -SSI per IM  Signed, Dossie Arbour, MD  [facility] 11/28/2014, 9:04 AM

## 2014-11-28 NOTE — Care Management Note (Signed)
Case Management Note  Patient Details  Name: [name] MRN: [id number] Date of Birth: 02-27-1931  Subjective/Objective:     Weak, shortness of breath, diarrhea x 4 days. Renal function worse. Plan is transfer to [place] at discharge. CSW following               Action/Plan:   Expected Discharge Date:                  Expected Discharge Plan:     In-House Referral:     Discharge planning Services     Post Acute Care Choice:    Choice offered to:     DME Arranged:    DME Agency:     HH Arranged:    HH Agency:     Status of Service:     Medicare Important Message Given:  Yes-fourth notification given Date Medicare IM Given:    Medicare IM give by:    Date Additional Medicare IM Given:    Additional Medicare Important Message give by:     If discussed at Long Length of Stay Meetings, dates discussed:    Additional Comments:  Marily Memos, RN 11/28/2014, 3:28 PM

## 2014-11-28 NOTE — Progress Notes (Signed)
PT Cancellation Note  Patient Details Name: MEHAR SAGEN MRN: [id number] DOB: 1931-10-06   Cancelled Treatment:    Reason Eval/Treat Not Completed: Patient declined, no reason specified. Treatment attempted; pt refuses all PT noting she is not feeling well and has diarrhea. Pt also states she is too weak and says "I just can't". Re attempt treatment tomorrow.   Elsie Stain Bishop 11/28/2014, 4:29 PM

## 2014-11-28 NOTE — Progress Notes (Signed)
PT Cancellation Note  Patient Details Name: [name] MRN: [id number] DOB: 12/27/1931   Cancelled Treatment:    Reason Eval/Treat Not Completed: Other (comment). Treatment attempted this a.m; family/MD in the room. Spoke with MD who notes he would like pt to try and attempt to sit up in the chair, but notes she may be too weak at this time. Pt also currently using bedpan; agreed to return this afternoon and attempt out of bed to chair. Will likely require 2 person assist.    Kristeen Miss 11/28/2014, 12:00 PM

## 2014-11-28 NOTE — Progress Notes (Signed)
[facility] [place] [facility]   PATIENT NAME: [name]    MR#:  [id number]  DATE OF BIRTH:  1931/04/30  SUBJECTIVE:   Continues to be SOB. Also very weak and poor appetite. Poor UOP. Diarrhea for 4 days now. C diff toxin negative.  REVIEW OF SYSTEMS:    Review of Systems  Constitutional: Negative for fever, chills and malaise/fatigue.  HENT: Negative for sore throat.   Eyes: Negative for blurred vision.  Respiratory: Negative for cough, hemoptysis, shortness of breath and wheezing.   Cardiovascular: Positive for leg swelling. Negative for chest pain and palpitations.  Gastrointestinal: Negative for nausea, vomiting, abdominal pain, diarrhea and blood in stool.  Genitourinary: Negative for dysuria.  Musculoskeletal: Negative for back pain.  Neurological: Negative for dizziness, tremors and headaches.  Endo/Heme/Allergies: Does not bruise/bleed easily.    Tolerating Diet: Yes DRUG ALLERGIES:   Allergies  Allergen Reactions  . Contrast Media [Iodinated Diagnostic Agents] Shortness Of Breath  . Tramadol Nausea Only  . Valium [Diazepam] Other (See Comments)    Reaction:  Elevated heart rate  . Iodine Strong [Iodine] Rash  . Penicillins Rash   VITALS:  Blood pressure 114/46, pulse 61, temperature 97.5 F (36.4 C), temperature source Oral, resp. rate 14, height  (1.549 m), weight 81.103 kg (178 lb 12.8 oz), SpO2 99 %. PHYSICAL EXAMINATION:   Physical Exam   GENERAL:  81 y.o.-year-old patient lying in the bed with no acute distress.  EYES: Pupils equal, round, reactive to light and accommodation. No scleral icterus. Extraocular muscles intact.  HEENT: Head atraumatic, normocephalic. Oropharynx and nasopharynx clear.  NECK:  Supple, no jugular venous distention. No thyroid enlargement, no tenderness.  LUNGS: Normal work of breathing. Poor air entry bilaterally. Crackles. CARDIOVASCULAR: S1, S2 normal. No murmurs, rubs, or  gallops.  ABDOMEN: Soft, nontender, nondistended. Bowel sounds present. No organomegaly or mass.  EXTREMITIES: No cyanosis, clubbing.    NEUROLOGIC: Cranial nerves II through XII are intact. No focal Motor or sensory deficits b/l.   PSYCHIATRIC: The patient is alert and oriented x 3.  SKIN: No obvious rash, lesion, or ulcer.  Generalized anasarca.  LABORATORY PANEL:   CBC  Recent Labs Lab 11/28/14 0525  WBC 4.7  HGB 9.0*  HCT 29.7*  PLT 238   ------------------------------------------------------------------------------------------------------------------  Chemistries   Recent Labs Lab 11/28/14 0525  NA 135  K 4.8  CL 90*  CO2 34*  GLUCOSE 145*  BUN 70*  CREATININE 2.12*  CALCIUM 8.2*    ASSESSMENT AND PLAN:   81 year old female with a history of chronic systolic heart failure, DJD with functional quadriplegia, type 2 diabetes and hypertension who presented with elevation in her creatinine and edema.  * Acute on chronic systolic heart failure with anasarca: Her echocardiogram showed EF of 20-25% with hypokinesis of several areas. 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 Apreciate cardio input.  Monitor input and output. Needs further inpatient diuresis but her Cr is worsening. Hold lasix today.  * Chronic respiratory failure is stable  * Acute renal failure on CKD 4 Patient's creatinine had improved but now with diuresis Cr is worsening Hold lasix  * ESBL urinary tract infection:  on Invanz which was started on September 24. She has PICC line and will need 10 days of antibiotics. Up till 11/21/14- stopped now.  * Anemia of chronic disease Stable No need for transfusion  * CAD status post CABG: Patient is not on ACE inhibitor/ARB due to hypotension and acute renal failure.   *  Atrial fibrillation: heart rate is controlled. Patient will continue on Eliquis and amiodarone.  * Diabetes type 2: Continue Lantus and sliding scale insulin.  * Hypothyroidism   On  Levothyroxin  She lives at [place] [facility] and will likely need rehab on D/C  Management plans discussed with the patient and she is in agreement.  CODE STATUS: DNR  TOTAL TIME TAKING CARE OF THIS PATIENT: 35 minutes.    Milagros Loll R M.D on 11/28/2014 at 2:39 PM  Between 7am to 6pm - Pager - [phone] After 6pm go to [email or web address] - password [email or web address] [facility]  [facility]  Office  [phone]  CC: Primary care physician; Fidel Levy, MD

## 2014-11-28 NOTE — Progress Notes (Signed)
Patient alert and oriented x4, no complaints at this time. vss at this time. Patient SB on telemetry. Will continue to assess. [name] R [place]   

## 2014-11-28 NOTE — Progress Notes (Signed)
Subjective:  Renal function continues to drop. BUN/Cr both higher. Reviewed cardiologys note. Doesn't appear to be significantly symptomatic.   Objective:  Vital signs in last 24 hours:  Temp:  [97.5 F (36.4 C)-98.2 F (36.8 C)] 97.5 F (36.4 C) (10/11 1109) Pulse Rate:  [54-61] 61 (10/11 1109) Resp:  [14-18] 14 (10/11 1109) BP: (114-120)/(32-64) 114/46 mmHg (10/11 1109) SpO2:  [99 %-100 %] 99 % (10/11 1109) Weight:  [81.103 kg (178 lb 12.8 oz)] 81.103 kg (178 lb 12.8 oz) (10/11 0500)  Weight change: -0.797 kg (-1 lb 12.1 oz) Filed Weights   11/26/14 0527 11/27/14 0500 11/28/14 0500  Weight: 81.9 kg (180 lb 8.9 oz) 81.9 kg (180 lb 8.9 oz) 81.103 kg (178 lb 12.8 oz)    Intake/Output:    Intake/Output Summary (Last 24 hours) at 11/28/14 1155 Last data filed at 11/28/14 1049  Gross per 24 hour  Intake      3 ml  Output    500 ml  Net   -497 ml     Physical Exam: General:  elderly, frail, NAD  HEENT  moist mucous membranes, anicteric sclera   Neck  supple   Pulm/lungs  mild basilar crackles, normal effort  CVS/Heart  S1S2 no rubs  Abdomen:   soft, nontender, nondistended   Extremities: 1+ dependent edema   Neurologic:  alert, oriented, speech normal   Skin:  no acute rashes, skin breakdown over shins      GU  Foley in place     Basic Metabolic Panel:   Recent Labs Lab 11/23/14 0454 11/24/14 0543 11/25/14 0438 11/27/14 0600 11/28/14 0525  NA 138 139 139 135 135  K 4.2 4.1 4.4 4.5 4.8  CL 95* 95* 95* 94* 90*  CO2 36* 37* 37* 36* 34*  GLUCOSE 72 56* 62* 167* 145*  BUN 62* 62* 58* 67* 70*  CREATININE 1.65* 1.65* 1.64* 2.05* 2.12*  CALCIUM 8.3* 8.1* 8.4* 8.0* 8.2*  PHOS  --  3.9  --   --   --      CBC:  Recent Labs Lab 11/22/14 0518 11/25/14 0438 11/28/14 0525  WBC 5.7 5.3 4.7  HGB 8.9* 8.5* 9.0*  HCT 28.1* 27.5* 29.7*  MCV 79.9* 80.9 81.2  PLT 223 218 238      Microbiology:  Recent Results (from the past 720 hour(s))  Blood Culture  (routine x 2)     Status: None   Collection Time: 11/09/14  2:34 PM  Result Value Ref Range Status   Specimen Description BLOOD RIGHT ARM  Final   Special Requests BOTTLES DRAWN AEROBIC AND ANAEROBIC 3CC  Final   Culture NO GROWTH 5 DAYS  Final   Report Status 11/14/2014 FINAL  Final  Urine culture     Status: None   Collection Time: 11/09/14  2:34 PM  Result Value Ref Range Status   Specimen Description URINE, CLEAN CATCH  Final   Special Requests NONE  Final   Culture   Final    >=100,000 COLONIES/mL ESCHERICHIA COLI ESBL-EXTENDED SPECTRUM BETA LACTAMASE-THE ORGANISM IS RESISTANT TO PENICILLINS, CEPHALOSPORINS AND AZTREONAM ACCORDING TO CLSI M100-S15 VOL.25 N01 JAN 2005. CRITICAL RESULT CALLED TO, READ BACK BY AND VERIFIED WITH: DANIEL JACOBS,RN 11/11/2014 0909 JRS.    Report Status 11/11/2014 FINAL  Final   Organism ID, Bacteria ESCHERICHIA COLI  Final      Susceptibility   Escherichia coli - MIC*    AMPICILLIN >=32 RESISTANT Resistant     CEFAZOLIN >=64 RESISTANT  Resistant     CEFTRIAXONE >=64 RESISTANT Resistant     CIPROFLOXACIN >=4 RESISTANT Resistant     GENTAMICIN <=1 SENSITIVE Sensitive     IMIPENEM <=0.25 SENSITIVE Sensitive     NITROFURANTOIN <=16 SENSITIVE Sensitive     TRIMETH/SULFA >=320 RESISTANT Resistant     Extended ESBL POSITIVE Resistant     PIP/TAZO Value in next row Sensitive      SENSITIVE8    LEVOFLOXACIN Value in next row Resistant      RESISTANT>=8    * >=100,000 COLONIES/mL ESCHERICHIA COLI  MRSA PCR Screening     Status: None   Collection Time: 11/09/14  6:24 PM  Result Value Ref Range Status   MRSA by PCR NEGATIVE NEGATIVE Final    Comment:        The GeneXpert MRSA Assay (FDA approved for NASAL specimens only), is one component of a comprehensive MRSA colonization surveillance program. It is not intended to diagnose MRSA infection nor to guide or monitor treatment for MRSA infections.   MRSA PCR Screening     Status: None   Collection  Time: 11/21/14  2:04 PM  Result Value Ref Range Status   MRSA by PCR NEGATIVE NEGATIVE Final    Comment:        The GeneXpert MRSA Assay (FDA approved for NASAL specimens only), is one component of a comprehensive MRSA colonization surveillance program. It is not intended to diagnose MRSA infection nor to guide or monitor treatment for MRSA infections.   C difficile quick scan w PCR reflex     Status: Abnormal   Collection Time: 11/24/14 11:01 PM  Result Value Ref Range Status   C Diff antigen POSITIVE (A) NEGATIVE Final   C Diff toxin NEGATIVE NEGATIVE Final   C Diff interpretation   Final    Negative for toxigenic C. difficile. Toxin gene and active toxin production not detected. May be a nontoxigenic strain of C. difficile bacteria present, lacking the ability to produce toxin.  Clostridium Difficile by PCR     Status: None   Collection Time: 11/24/14 11:01 PM  Result Value Ref Range Status   Toxigenic C Difficile by pcr NEGATIVE NEGATIVE Final  Stool culture     Status: None (Preliminary result)   Collection Time: 11/27/14  9:00 PM  Result Value Ref Range Status   Specimen Description STOOL  Final   Special Requests NONE  Final   Culture TOO YOUNG TO READ  Final   Report Status PENDING  Incomplete    Coagulation Studies: No results for input(s): LABPROT, INR in the last 72 hours.  Urinalysis: No results for input(s): COLORURINE, LABSPEC, PHURINE, GLUCOSEU, HGBUR, BILIRUBINUR, KETONESUR, PROTEINUR, UROBILINOGEN, NITRITE, LEUKOCYTESUR in the last 72 hours.  Invalid input(s): APPERANCEUR    Imaging: No results found.   Medications:     . acidophilus  1 capsule Oral BID  . amiodarone  200 mg Oral Daily  . antiseptic oral rinse  7 mL Mouth Rinse BID  . apixaban  2.5 mg Oral BID  . atorvastatin  40 mg Oral q1800  . docusate sodium  100 mg Oral BID  . feeding supplement  1 Container Oral TID WC  . gabapentin  300 mg Oral QHS  . insulin aspart  0-5 Units  Subcutaneous QHS  . insulin aspart  0-9 Units Subcutaneous TID WC  . levothyroxine  100 mcg Oral QAC breakfast  . lubiprostone  24 mcg Oral BID WC  . metoprolol tartrate  12.5 mg Oral BID  . nystatin cream   Topical BID  . pantoprazole  40 mg Oral Daily  . sodium chloride  3 mL Intravenous Q12H   acetaminophen **OR** acetaminophen, albuterol, alum & mag hydroxide-simeth, HYDROcodone-acetaminophen, nitroGLYCERIN, ondansetron **OR** ondansetron (ZOFRAN) IV, polyethylene glycol, promethazine, simethicone, zolpidem  Assessment/ Plan:  81 y.o. female with a PMHX of chronic systolic congestive heart failure, type 2 diabetes, hypertension, atrial fibrillation, DJD, COPD, coronary disease/CABG 1988, catheter 2015, recurrent UTIs, chronic kidney disease, who was admitted to [facility] on 11/09/2014 for evaluation of elevated creatinine above her baseline and increased swelling.   1. Acute renal failure on chronic kidney disease stage 4 . Patient's baseline creatinine is 1.66/GFR of 28. CKD is likely secondary to atherosclerosis and hypertension.  Acute renal failure from acute cardiorenal syndrome. Had with dobutamine, dopamine, albumin and furosemide. - Will stop lasix for now, agree with Dr. Mariah Milling, will see how the patient declares herself.  Will hold off on any hydration, though may need to consider gentle hydration if Cr plateaus.    2. Hypertension and Anasarca/edema. From systolic congestive heart failure acute exacerbation. - as above holding lasix for now, will follow Cr trend and see if she becomes symptomatic off of diuretic therapy.        LOS: 19 [name] 10/11/201611:55 AM

## 2014-11-29 DIAGNOSIS — R197 Diarrhea, unspecified: Secondary | ICD-10-CM

## 2014-11-29 DIAGNOSIS — E43 Unspecified severe protein-calorie malnutrition: Secondary | ICD-10-CM | POA: Diagnosis present

## 2014-11-29 DIAGNOSIS — I255 Ischemic cardiomyopathy: Secondary | ICD-10-CM

## 2014-11-29 LAB — BASIC METABOLIC PANEL
ANION GAP: 6 (ref 5–15)
BUN: 71 mg/dL — ABNORMAL HIGH (ref 6–20)
CALCIUM: 8.1 mg/dL — AB (ref 8.9–10.3)
CO2: 35 mmol/L — AB (ref 22–32)
Chloride: 92 mmol/L — ABNORMAL LOW (ref 101–111)
Creatinine, Ser: 2.35 mg/dL — ABNORMAL HIGH (ref 0.44–1.00)
GFR calc non Af Amer: 18 mL/min — ABNORMAL LOW (ref >60)
GFR, EST AFRICAN AMERICAN: 21 mL/min — AB (ref >60)
Glucose, Bld: 195 mg/dL — ABNORMAL HIGH (ref 65–99)
Potassium: 4.7 mmol/L (ref 3.5–5.1)
Sodium: 133 mmol/L — ABNORMAL LOW (ref 135–145)

## 2014-11-29 LAB — GLUCOSE, CAPILLARY
GLUCOSE-CAPILLARY: 240 mg/dL — AB (ref 65–99)
Glucose-Capillary: 176 mg/dL — ABNORMAL HIGH (ref 65–99)
Glucose-Capillary: 220 mg/dL — ABNORMAL HIGH (ref 65–99)
Glucose-Capillary: 250 mg/dL — ABNORMAL HIGH (ref 65–99)

## 2014-11-29 LAB — CARBOXYHEMOGLOBIN
CARBOXYHEMOGLOBIN: 2 % (ref 1.5–9.0)
O2 SAT: 95.3 %
Total oxygen content: 93.8 mL/dL

## 2014-11-29 MED ORDER — HYDROCODONE-ACETAMINOPHEN 5-325 MG PO TABS
2.0000 | ORAL_TABLET | Freq: Four times a day (QID) | ORAL | Status: DC | PRN
  Administered 2014-11-29 – 2014-12-04 (×8): 2 via ORAL
  Filled 2014-11-29 (×8): qty 2

## 2014-11-29 MED ORDER — SODIUM CHLORIDE 0.9 % IV SOLN
INTRAVENOUS | Status: DC
  Administered 2014-11-29 – 2014-12-02 (×3): via INTRAVENOUS

## 2014-11-29 MED ORDER — METRONIDAZOLE 500 MG PO TABS
500.0000 mg | ORAL_TABLET | Freq: Three times a day (TID) | ORAL | Status: DC
  Administered 2014-11-29 – 2014-11-30 (×3): 500 mg via ORAL
  Filled 2014-11-29 (×3): qty 1

## 2014-11-29 NOTE — Progress Notes (Signed)
Nutrition Follow-up  DOCUMENTATION CODES:   Severe malnutrition in context of acute illness/injury  INTERVENTION:   Coordination of Care: palliative care following, per MD Orvan Falconerampbell note, pt does not want feeding tube. Pt with very limited po intake, intake consists mainly of peach jello, some pudding and bites of chicken salad daily. Pt reports she has cannot tolerate any of the supplements as they are all too sweet including Boost Breeze, Ensure [employer]live, [employer] Cup, Mighty Shakes and [employer] Instant Breakfast. Discussed options of diluting supplements down with milk, water or juice to decrease the sweetness and make it more palatable for her but pt reports she can only tolerate milk in the AM, reports they are still too sweet. At this time will discontinue nutritional supplements as pt is not drinking them. Pt reports she is getting nutrition from her jello; explained to pt that jello has little nutritional value including no protein; brainstormed with pt on protein containing foods, pt likes cottage cheese, chicken salad, egg salad. Continue snack of chicken salad/egg salad daily; discussed increasing the frequency of this, pt thinks she can only tolerate this daily. Will continue to follow weekly but nothing further to offer if nutrition support is not wanted  NUTRITION DIAGNOSIS:   Inadequate oral intake related to poor appetite as evidenced by per patient/family report. Continues  GOAL:   Patient will meet greater than or equal to 90% of their needs  MONITOR:    (Energy Intake, Anthropometrics, Electrolyte and Renal Profile, digestive system)  REASON FOR ASSESSMENT:   Diagnosis    ASSESSMENT:   Pt oligruic, poor renal function, edema persists, pt weak, appetite remains poor; noted palliative care following  Diet Order:  DIET DYS 3 Room service appropriate?: Yes with Assist; Fluid consistency:: Thin   Energy Intake: po intake remains poor, pt ate jello, bites of pudding at lunch  today. Pt reports she ate jello at breakfast today. Boost Breeze had not been touched, nor had the [employer] Instant Breakfast. Pt reports all the supplements are too sweet. No recorded po intake since 10/7  Digestive System: pt reports burping/bleching at night, now has diarrhea, no N/V at present but pt does have intermittent nausea daily  Electrolyte and Renal Profile:  Recent Labs Lab 11/24/14 0543  11/27/14 0600 11/28/14 0525 11/29/14 0921  BUN 62*  < > 67* 70* 71*  CREATININE 1.65*  < > 2.05* 2.12* 2.35*  NA 139  < > 135 135 133*  K 4.1  < > 4.5 4.8 4.7  PHOS 3.9  --   --   --   --   < > = values in this interval not displayed. Glucose Profile:   Recent Labs  11/28/14 2138 11/29/14 0742 11/29/14 1152  GLUCAP 250* 176* 220*   Meds: NS at 50 ml/hr, acidophilus, ss novolog  Nutrition Focused Physical Exam: Nutrition-Focused physical exam completed. Findings are mild fat depletion, mild/moderate muscle depletion, and mild/moderate edema.   Height:   Ht Readings from Last 1 Encounters:  11/27/14 5\' 1"  (1.549 m)    Weight:   Wt Readings from Last 1 Encounters:  11/29/14 178 lb 1.6 oz (80.786 kg)    BMI:  Body mass index is 33.67 kg/(m^2).  Estimated Nutritional Needs:   Kcal:  using IBW of 48kg, BEE: 882kcals, TEE: (IF 1.0-1.2)(AF 1.3) 1146-1490kcals  Protein:  using IBW of 48kg, 48-58g protein (1.0-1.2g/kg)  Fluid:  using IBW of 48kg, (25-2430mL/kg) 1440-163780mL/kg   LOW Care Level  Romelle Starcherate Mija Effertz MS, RD, LDN (  [phone] Pager

## 2014-11-29 NOTE — Progress Notes (Signed)
Patient Name: Sara DemarkShirley A Clayton Date of Encounter: 11/29/2014   Principal Problem:   Acute on chronic combined systolic and diastolic congestive heart failure (HCC) Active Problems:   Renal failure (ARF), acute on chronic (HCC)   Cardiomyopathy, ischemic   Paroxysmal atrial fibrillation (HCC)   Essential hypertension   DM2 (diabetes mellitus, type 2) (HCC)   Coronary artery disease involving coronary bypass graft of native heart without angina pectoris   Bilateral leg edema   Hyperlipidemia   COPD (chronic obstructive pulmonary disease) (HCC)   Elevated troponin    SUBJECTIVE  "I feel rough."  Breathing stable however she c/o left hip pain.  Wishes to have her pain meds adjusted.  CURRENT MEDS . acidophilus  1 capsule Oral BID  . amiodarone  200 mg Oral Daily  . antiseptic oral rinse  7 mL Mouth Rinse BID  . apixaban  2.5 mg Oral BID  . atorvastatin  40 mg Oral q1800  . docusate sodium  100 mg Oral BID  . feeding supplement  1 Container Oral TID WC  . gabapentin  300 mg Oral QHS  . insulin aspart  0-5 Units Subcutaneous QHS  . insulin aspart  0-9 Units Subcutaneous TID WC  . levothyroxine  100 mcg Oral QAC breakfast  . lubiprostone  24 mcg Oral BID WC  . metoprolol tartrate  12.5 mg Oral BID  . nystatin cream   Topical BID  . pantoprazole  40 mg Oral Daily  . sodium chloride  3 mL Intravenous Q12H    OBJECTIVE  Filed Vitals:   11/28/14 1109 11/28/14 1928 11/29/14 0626 11/29/14 0853  BP: 114/46 106/59 107/46   Pulse: 61 65 54   Temp: 97.5 F (36.4 C) 98.6 F (37 C) 97.8 F (36.6 C)   TempSrc: Oral  Oral   Resp: 14 23 19    Height:      Weight:    178 lb 1.6 oz (80.786 kg)  SpO2: 99% 98% 98%     Intake/Output Summary (Last 24 hours) at 11/29/14 1056 Last data filed at 11/28/14 2200  Gross per 24 hour  Intake      0 ml  Output    150 ml  Net   -150 ml   Filed Weights   11/27/14 0500 11/28/14 0500 11/29/14 0853  Weight: 180 lb 8.9 oz (81.9 kg) 178 lb  12.8 oz (81.103 kg) 178 lb 1.6 oz (80.786 kg)    PHYSICAL EXAM  General: Pleasant, NAD. Neuro: Alert and oriented X 3. Moves all extremities spontaneously. Psych: Normal affect. HEENT:  Normal  Neck: Supple without bruits.  JVD to jaw. Lungs:  Resp regular and unlabored, crackles ~ 1/2 up bilat - more pronounced on right.  More diminished left sounds in left base. Heart: RRR no s3, s4, or murmurs.  Somewhat distant. Abdomen: Firm with flank and lower abdominal edema. Non-tender.  BS + x 4.  Extremities: No clubbing, cyanosis.  1+ bilat LE edema to mid-ankle - puffy. DP/PT/Radials 1+ and equal bilaterally.  Accessory Clinical Findings  CBC  Recent Labs  11/28/14 0525  WBC 4.7  HGB 9.0*  HCT 29.7*  MCV 81.2  PLT 238   Basic Metabolic Panel  Recent Labs  11/28/14 0525 11/29/14 0921  NA 135 133*  K 4.8 4.7  CL 90* 92*  CO2 34* 35*  GLUCOSE 145* 195*  BUN 70* 71*  CREATININE 2.12* 2.35*  CALCIUM 8.2* 8.1*   TELE  Rsr, pvc's.  Radiology/Studies  Dg Chest 2 View  11/25/2014  CLINICAL DATA:  Shortness of breath.  CHF. EXAM: CHEST  2 VIEW COMPARISON:  11/22/2014 chest radiograph. FINDINGS: Right PICC terminates in the lower third of the superior vena cava. Median sternotomy wires are aligned and intact. No pneumothorax. Stable small right and moderate left pleural effusions. Stable mild pulmonary edema. Stable cardiomediastinal silhouette with mild to moderate cardiomegaly. Stable left greater than right bibasilar lung opacities, favor atelectasis. IMPRESSION: 1. Stable mild congestive heart failure. 2. Stable small right and moderate left pleural effusions with associated bibasilar lung opacities likely representing atelectasis. Electronically Signed   By: Delbert Phenix M.D.   On: 11/25/2014 10:07   US Renal  11/11/2014  CLINICAL DATA:  Acute renal failure EXAM: RENAL / URINARY TRACT ULTRASOUND COMPLETE COMPARISON:  CT 10/20/2014 FINDINGS: Right Kidney: Length: 10.8 cm.  Slightly increased echotexture. No mass or hydronephrosis visualized. Left Kidney: Length: 8.9 cm. Increased echotexture. No mass or hydronephrosis visualized. Bladder: Appears normal for degree of bladder distention. Ascites noted in the abdomen adjacent to the liver and spleen as seen on recent CT IMPRESSION: Increased echotexture within the kidneys bilaterally suggesting chronic medical renal disease. No hydronephrosis. Ascites. Electronically Signed   By: Charlett Nose M.D.   On: 11/11/2014 09:48   2D Echocardiogram 9.27.2016  Study Conclusions   - Left ventricle: The cavity size was mildly dilated. Systolic   function was severely reduced. The estimated ejection fraction   was in the range of 20% to 25%. Diffuse hypokinesis with severe   hypokinesis of the anterior, antoseptal and apical regions. Left   ventricular diastolic function parameters were normal. - Mitral valve: There was moderate regurgitation. - Left atrium: The atrium was mildly dilated. - Right ventricle: Systolic function was mildly reduced. - Tricuspid valve: There was moderate regurgitation. - Pulmonary arteries: Systolic pressure was mild to moderately   elevated. PA peak pressure: 46 mm Hg (S).   Impressions:   - Mild to moderate sized pleural effusion noted on the left. _____________   ASSESSMENT AND PLAN  81 y.o. female with h/o CAD s/p CABG s/p PCI on SVGs, ischemic cardiomyopathy/chronic systolic CHF, PAF on Eliquis, PVD, COPD requiring nocturnal oxygen, CKD stage III, IDDM, HLD, and HTN who has been admitted recently to [facility] in mid June and mid July for ESBL UTI, HCAP and acute on chronic systolic CHF; mid August for ileus; late August for HCAP again; who presented to [facility] on 9/22 with from [facility] with acute on chronic CKD. Cardiology was consulted on 10/4 with acute on chronic systolic CHF in the setting of newly depressed EF of 20-25% by echo on 11/14/2014.  1. Acute on chronic systolic CHF/ICM: Ejection  fraction 20-25% by echo 9/27. Previously treated with pressors/inotropes with improvement of her renal function and with diuresis however, following several days of diarrhea and anorexia, her creatinine has been climbing and is now 2.35. Net negative 1.59L for this admission.  Wts have been variably reported with a max reported wt of 189 lbs on 9/30, though she has been stable @ 178 over the past 48 hrs. She has marked anasarca with dependent edema of her flanks and lower abdomen. Echocardiogram approximately 2 weeks ago with only mildly elevated right heart pressures. With rising creatinine and ongoing anasarca, cardiorenal syndrome is obviously a concern. --Though she is not currently set up for hemodynamic monitoring, she does have a RUE PICC line, off of which we could transduce a CVP.  We should be able  to draw a co-ox off of her PICC to better gauge her cardiac output.  Collectively, these measures could help [place] to avoid a right heart cath and would be very helpful in guiding our decisions re: diuresis and inotropic therapy.   --Not a good candidate for bridging therapy to more advanced cardiac support such as LVAD --Certainly placing her back on pressors may improve her renal function though this would again be a temporary fix - await co-ox - if low, may need to d/c BB.  2. Acute on CKD stage IV: Creat worsening since 10/8 in the setting of diarrhea, low oral fluid int. -Diuretics on hold.  3. PAF: Currently in sinus rhythm -Cont carvedilol, amiodarone, Eliquis 2.5 mg bid (adjusted 2/2 age/creat). -CHADSVASc at least 7 (CHF, HTN, DM, vascular disease, age x 2, female)  4. CAD s/p CABG and PCI as above: No angina. Trop 0.05 on admission. Severe native disease on cardiac catheterization in 05/2014 with patent LIMA  LAD and VG  OM - stable since 12/2013 cath. New LV dysfxn raises concern for occlusion of a graft however low level troponin argues against this. Given multiple comorbidities,  she is not a good candidate for further ischemic workup at this time, especially in light of acute on chronic renal failure. -On Eliquis in place of aspirin  -Cont bb/statin.   5. DM2: -SSI per IM  Signed, Nicolasa Ducking NP

## 2014-11-29 NOTE — Progress Notes (Signed)
Subjective:   creatinine slightly higher at 2.3 today. Urine output also in the oliguric range. Patient also has malaise.  Objective:  Vital signs in last 24 hours:  Temp:  [97.4 F (36.3 C)-98.6 F (37 C)] 97.4 F (36.3 C) (10/12 1234) Pulse Rate:  [53-65] 53 (10/12 1234) Resp:  [16-23] 16 (10/12 1234) BP: (90-107)/(40-59) 90/40 mmHg (10/12 1234) SpO2:  [98 %-100 %] 100 % (10/12 1234) Weight:  [80.786 kg (178 lb 1.6 oz)] 80.786 kg (178 lb 1.6 oz) (10/12 0853)  Weight change:  Filed Weights   11/27/14 0500 11/28/14 0500 11/29/14 0853  Weight: 81.9 kg (180 lb 8.9 oz) 81.103 kg (178 lb 12.8 oz) 80.786 kg (178 lb 1.6 oz)    Intake/Output:    Intake/Output Summary (Last 24 hours) at 11/29/14 1441 Last data filed at 11/28/14 2200  Gross per 24 hour  Intake      0 ml  Output    150 ml  Net   -150 ml     Physical Exam: General:  elderly, frail, NAD  HEENT  moist mucous membranes, anicteric sclera   Neck  supple   Pulm/lungs  mild basilar crackles, normal effort  CVS/Heart  S1S2 no rubs  Abdomen:   soft, nontender, nondistended   Extremities: 1+ dependent edema   Neurologic:  alert, oriented, speech normal   Skin:  no acute rashes, skin breakdown over shins      GU  Foley in place     Basic Metabolic Panel:   Recent Labs Lab 11/24/14 0543 11/25/14 0438 11/27/14 0600 11/28/14 0525 11/29/14 0921  NA 139 139 135 135 133*  K 4.1 4.4 4.5 4.8 4.7  CL 95* 95* 94* 90* 92*  CO2 37* 37* 36* 34* 35*  GLUCOSE 56* 62* 167* 145* 195*  BUN 62* 58* 67* 70* 71*  CREATININE 1.65* 1.64* 2.05* 2.12* 2.35*  CALCIUM 8.1* 8.4* 8.0* 8.2* 8.1*  PHOS 3.9  --   --   --   --      CBC:  Recent Labs Lab 11/25/14 0438 11/28/14 0525  WBC 5.3 4.7  HGB 8.5* 9.0*  HCT 27.5* 29.7*  MCV 80.9 81.2  PLT 218 238      Microbiology:  Recent Results (from the past 720 hour(s))  Blood Culture (routine x 2)     Status: None   Collection Time: 11/09/14  2:34 PM  Result Value  Ref Range Status   Specimen Description BLOOD RIGHT ARM  Final   Special Requests BOTTLES DRAWN AEROBIC AND ANAEROBIC 3CC  Final   Culture NO GROWTH 5 DAYS  Final   Report Status 11/14/2014 FINAL  Final  Urine culture     Status: None   Collection Time: 11/09/14  2:34 PM  Result Value Ref Range Status   Specimen Description URINE, CLEAN CATCH  Final   Special Requests NONE  Final   Culture   Final    >=100,000 COLONIES/mL ESCHERICHIA COLI ESBL-EXTENDED SPECTRUM BETA LACTAMASE-THE ORGANISM IS RESISTANT TO PENICILLINS, CEPHALOSPORINS AND AZTREONAM ACCORDING TO CLSI M100-S15 VOL.25 N01 JAN 2005. CRITICAL RESULT CALLED TO, READ BACK BY AND VERIFIED WITH: DANIEL JACOBS,RN 11/11/2014 0909 JRS.    Report Status 11/11/2014 FINAL  Final   Organism ID, Bacteria ESCHERICHIA COLI  Final      Susceptibility   Escherichia coli - MIC*    AMPICILLIN >=32 RESISTANT Resistant     CEFAZOLIN >=64 RESISTANT Resistant     CEFTRIAXONE >=64 RESISTANT Resistant  CIPROFLOXACIN >=4 RESISTANT Resistant     GENTAMICIN <=1 SENSITIVE Sensitive     IMIPENEM <=0.25 SENSITIVE Sensitive     NITROFURANTOIN <=16 SENSITIVE Sensitive     TRIMETH/SULFA >=320 RESISTANT Resistant     Extended ESBL POSITIVE Resistant     PIP/TAZO Value in next row Sensitive      SENSITIVE8    LEVOFLOXACIN Value in next row Resistant      RESISTANT>=8    * >=100,000 COLONIES/mL ESCHERICHIA COLI  MRSA PCR Screening     Status: None   Collection Time: 11/09/14  6:24 PM  Result Value Ref Range Status   MRSA by PCR NEGATIVE NEGATIVE Final    Comment:        The GeneXpert MRSA Assay (FDA approved for NASAL specimens only), is one component of a comprehensive MRSA colonization surveillance program. It is not intended to diagnose MRSA infection nor to guide or monitor treatment for MRSA infections.   MRSA PCR Screening     Status: None   Collection Time: 11/21/14  2:04 PM  Result Value Ref Range Status   MRSA by PCR NEGATIVE  NEGATIVE Final    Comment:        The GeneXpert MRSA Assay (FDA approved for NASAL specimens only), is one component of a comprehensive MRSA colonization surveillance program. It is not intended to diagnose MRSA infection nor to guide or monitor treatment for MRSA infections.   C difficile quick scan w PCR reflex     Status: Abnormal   Collection Time: 11/24/14 11:01 PM  Result Value Ref Range Status   C Diff antigen POSITIVE (A) NEGATIVE Final   C Diff toxin NEGATIVE NEGATIVE Final   C Diff interpretation   Final    Negative for toxigenic C. difficile. Toxin gene and active toxin production not detected. May be a nontoxigenic strain of C. difficile bacteria present, lacking the ability to produce toxin.  Clostridium Difficile by PCR     Status: None   Collection Time: 11/24/14 11:01 PM  Result Value Ref Range Status   Toxigenic C Difficile by pcr NEGATIVE NEGATIVE Final  Stool culture     Status: None (Preliminary result)   Collection Time: 11/27/14  9:00 PM  Result Value Ref Range Status   Specimen Description STOOL  Final   Special Requests NONE  Final   Culture   Final    NO SALMONELLA OR SHIGELLA ISOLATED No Pathogenic E. coli detected NO CAMPYLOBACTER DETECTED    Report Status PENDING  Incomplete    Coagulation Studies: No results for input(s): LABPROT, INR in the last 72 hours.  Urinalysis: No results for input(s): COLORURINE, LABSPEC, PHURINE, GLUCOSEU, HGBUR, BILIRUBINUR, KETONESUR, PROTEINUR, UROBILINOGEN, NITRITE, LEUKOCYTESUR in the last 72 hours.  Invalid input(s): APPERANCEUR    Imaging: No results found.   Medications:     . acidophilus  1 capsule Oral BID  . amiodarone  200 mg Oral Daily  . antiseptic oral rinse  7 mL Mouth Rinse BID  . apixaban  2.5 mg Oral BID  . atorvastatin  40 mg Oral q1800  . docusate sodium  100 mg Oral BID  . feeding supplement  1 Container Oral TID WC  . gabapentin  300 mg Oral QHS  . insulin aspart  0-5 Units  Subcutaneous QHS  . insulin aspart  0-9 Units Subcutaneous TID WC  . levothyroxine  100 mcg Oral QAC breakfast  . lubiprostone  24 mcg Oral BID WC  . metoprolol tartrate  12.5 mg Oral BID  . metroNIDAZOLE  500 mg Oral 3 times per day  . nystatin cream   Topical BID  . pantoprazole  40 mg Oral Daily  . sodium chloride  3 mL Intravenous Q12H   acetaminophen **OR** acetaminophen, albuterol, alum & mag hydroxide-simeth, HYDROcodone-acetaminophen, loperamide, nitroGLYCERIN, ondansetron **OR** ondansetron (ZOFRAN) IV, polyethylene glycol, promethazine, simethicone, zolpidem  Assessment/ Plan:  81 y.o. female with a PMHX of chronic systolic congestive heart failure, type 2 diabetes, hypertension, atrial fibrillation, DJD, COPD, coronary disease/CABG 1988, catheter 2015, recurrent UTIs, chronic kidney disease, who was [facility]09[facility] for evaluation of elevated creatinine above her baseline and increased swelling.   1. Acute renal failure on chronic kidney disease stage 4 . Patient's baseline creatinine is 1.66/GFR of 28. CKD is likely secondary to atherosclerosis and hypertension.  Acute renal failure from acute cardiorenal syndrome. Had with dobutamine, dopamine, albumin and furosemide. - patient still having loose stools and poor by mouth intake.  Likely contributing to acute renal failure.  Blood pressure also low.  We will start the patient on gentle hydration with 0.9 normal saline at 50 cc per hour for now.  2. Hypertension and Anasarca/edema. From systolic congestive heart failure acute exacerbation. - continue to hold diuretic therapy for now as the patient has ongoing volume loss and low blood pressure.      LOS: 20 Crislyn Willbanks 10/12/20162:41 PM

## 2014-11-29 NOTE — Progress Notes (Signed)
[facility] - [place] at [facility]   PATIENT NAME: [name]    MR#:  [id number]  DATE OF BIRTH:  1931/04/03  SUBJECTIVE:   Continues to be SOB. Also very weak and poor appetite. Poor UOP. Oliguric range Diarrhea for 5 days now. C diff toxin negative. But antigen positive  REVIEW OF SYSTEMS:    Review of Systems  Constitutional: Positive for malaise/fatigue. Negative for fever and chills.  HENT: Negative for sore throat.   Eyes: Negative for blurred vision.  Respiratory: Positive for shortness of breath. Negative for cough, hemoptysis and wheezing.   Cardiovascular: Negative for chest pain, palpitations and leg swelling (Anasarca).  Gastrointestinal: Positive for diarrhea. Negative for nausea, vomiting, abdominal pain and blood in stool.  Genitourinary: Negative for dysuria.  Musculoskeletal: Negative for back pain.  Neurological: Negative for dizziness, tremors and headaches.  Endo/Heme/Allergies: Does not bruise/bleed easily.    Tolerating Diet: Yes DRUG ALLERGIES:   Allergies  Allergen Reactions  . Contrast Media [Iodinated Diagnostic Agents] Shortness Of Breath  . Tramadol Nausea Only  . Valium [Diazepam] Other (See Comments)    Reaction:  Elevated heart rate  . Iodine Strong [Iodine] Rash  . Penicillins Rash   VITALS:  Blood pressure 90/40, pulse 53, temperature 97.4 F (36.3 C), temperature source Oral, resp. rate 16, height  (1.549 m), weight 80.786 kg (178 lb 1.6 oz), SpO2 100 %. PHYSICAL EXAMINATION:   Physical Exam   GENERAL:  81 y.o.-year-old patient lying in the bed with no acute distress.  EYES: Pupils equal, round, reactive to light and accommodation. No scleral icterus. Extraocular muscles intact.  HEENT: Head atraumatic, normocephalic. Oropharynx and nasopharynx clear.  NECK:  Supple, no jugular venous distention. No thyroid enlargement, no tenderness.  LUNGS: Normal work of breathing. Poor air entry  bilaterally. Crackles. CARDIOVASCULAR: S1, S2 normal. No murmurs, rubs, or gallops.  ABDOMEN: Soft, nontender, nondistended. Bowel sounds present. No organomegaly or mass.  EXTREMITIES: No cyanosis, clubbing.    NEUROLOGIC: Cranial nerves II through XII are intact. No focal Motor or sensory deficits b/l.   PSYCHIATRIC: The patient is alert and oriented x 3.  SKIN: No obvious rash, lesion, or ulcer.  Generalized anasarca.  LABORATORY PANEL:   CBC  Recent Labs Lab 11/28/14 0525  WBC 4.7  HGB 9.0*  HCT 29.7*  PLT 238   ------------------------------------------------------------------------------------------------------------------  Chemistries   Recent Labs Lab 11/29/14 0921  NA 133*  K 4.7  CL 92*  CO2 35*  GLUCOSE 195*  BUN 71*  CREATININE 2.35*  CALCIUM 8.1*    ASSESSMENT AND PLAN:   81 year old female with a history of chronic systolic heart failure, DJD with functional quadriplegia, type 2 diabetes and hypertension who presented with elevation in her creatinine and edema.  * Diarrhea C diff antigen is positive but toxin is negative. Afebrile. Normal WBC. Will start Flagyl due to ongoing diarrhea and recent abx use  * Acute on chronic systolic heart failure with anasarca: Her echocardiogram showed EF of 20-25% with hypokinesis of several areas. 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 Apreciate cardio input.  Monitor input and output. Needs further inpatient diuresis but her Cr is worsening. Hold lasix today.  * Chronic respiratory failure is stable  * Acute renal failure on CKD 4 Patient's creatinine had improved but now with diuresis Cr is worsening Hold lasix Dr. Cherylann Ratel has started 50 ml/hr NS today.  * ESBL urinary tract infection Was on Invanz Abx finished for 10 days and stopped  *  Anemia of chronic disease Stable No need for transfusion  * CAD status post CABG: Patient is not on ACE inhibitor/ARB due to hypotension and acute renal failure.   * Atrial  fibrillation: heart rate is controlled. Patient will continue on Eliquis and amiodarone.  * Diabetes type 2: Continue Lantus and sliding scale insulin.  * Hypothyroidism   On Levothyroxin  [facility] and will likely need rehab on D/C  Poor prognosis due to multiple comorbidities including ejection fraction of only 20%. Now has acute renal failure. Likely cardiorenal syndrome. Patient's CODE STATUS has been changed to DO NOT RESUSCITATE by palliative care yesterday. She will likely need hospice services at discharge. May end up needing dialysis if her anasarca worsens along with the renal failure.  CODE STATUS: DNR  TOTAL TIME TAKING CARE OF THIS PATIENT: 35 minutes.    [name], [name] M.D on 11/29/2014 at 3:45 PM  Between 7am to 6pm - Pager - [phone] After 6pm go to [email or web address] - password [email or web address] [facility]  [facility]  Office  [phone]  CC: Primary care physician; Fidel LevyJames Hawkins Jr, MD

## 2014-11-29 NOTE — Progress Notes (Signed)
[facility] team on floor for another patient, consulted to see if they could fix patient's PICC while they are here. [facility] was called for the referral and RN Marliss CzarLeigh took a look at the PICC. The dressing was changed and the PICC is now functioning.

## 2014-11-29 NOTE — Progress Notes (Signed)
Physical Therapy Treatment Patient Details Name: [name] MRN: [id number] DOB: 1931-09-14 Today's Date: 11/29/2014    History of Present Illness Pt with past medical history of chronic systolic congestive heart failure, DJD with patient being bedbound for last 6 months, type 2 diabetes, hypertension, A. fib on a liquid comes to the emergency room on 9/22 after she was found to have elevated creatinine on her routine lab work that was done at her nursing home. Pt recently transfered to step-down where she now has new PT order to continue treatment.     PT Comments    Pt agreeable to PT today, but only for bed exercises despite gentle encouragement for out of bed to chair today. Pt notes she is afraid because she is too tired and too weak. Discussed have extra help, but pt continues to refuse. Participated in bed exercises with need to re awaken patient several times. Continue PT for progression of strength and endurance to allow for progression to functional mobility in/out of bed.  Follow Up Recommendations  SNF     Equipment Recommendations  Rolling walker with 5" wheels    Recommendations for Other Services       Precautions / Restrictions Restrictions Weight Bearing Restrictions: No    Mobility  Bed Mobility               General bed mobility comments: Refuses out of bed despite gentle encouragement  Transfers                    Ambulation/Gait                 Stairs            Wheelchair Mobility    Modified Rankin (Stroke Patients Only)       Balance                                    Cognition Arousal/Alertness: Awake/alert Behavior During Therapy: WFL for tasks assessed/performed Overall Cognitive Status: Within Functional Limits for tasks assessed                      Exercises General Exercises - Lower Extremity Ankle Circles/Pumps: AROM;Both;20 reps;Supine Quad Sets:  Strengthening;Both;Supine;15 reps Gluteal Sets: Strengthening;Both;Supine;15 reps Short Arc Quad: AAROM;Both;Supine;15 reps Heel Slides: AAROM;Both;Supine;15 reps Hip ABduction/ADduction: AAROM;Both;15 reps;Supine Straight Leg Raises: AROM;Both;10 reps;Supine    General Comments        Pertinent Vitals/Pain Pain Assessment:  (Reports she hurts all over; does not quantify)    Home Living                      Prior Function            PT Goals (current goals can now be found in the care plan section) Progress towards PT goals:  (slowly)    Frequency  Min 2X/week    PT Plan Current plan remains appropriate    Co-evaluation             End of Session   Activity Tolerance: Patient limited by fatigue Patient left: in bed;with call bell/phone within reach;with bed alarm set     Time: [id number] PT Time Calculation (min) (ACUTE ONLY): 20 min  Charges:  G Codes:      Kristeen Miss 11/29/2014, 11:35 AM

## 2014-11-29 NOTE — Care Management Important Message (Signed)
Important Message  Patient Details  Name: [name] MRN: [id number] Date of Birth: [birth year]   Medicare Important Message Given:  Fifth note given   Eber HongGreene, Ninoska Goswick R, RN 11/29/2014, 4:26 PM

## 2014-11-29 NOTE — Progress Notes (Signed)
PICC line was not able to be flushed or drawn from at this time. No draw back. Respiratory has drawn carboxyhemoglobin.

## 2014-11-29 NOTE — Progress Notes (Signed)
Notified by NT that patient soaked her bedding with urine. Catheter was checked and the balloon was deflated and re-inflated. Catheter placement is confirmed, but is leaking somehow. Patient states this has happened to her before. Will pass on in report and continue to assess.

## 2014-11-29 NOTE — Progress Notes (Signed)
Palliative Medicine Inpatient Consult Follow Up Note   Name: [name] Date: 11/29/2014 MRN: [id number]  DOB: 10-Oct-1931  Referring Physician: Milagros Loll, MD  Palliative Care consult requested for this 81 y.o. female for goals of medical therapy in patient with ongoing renal failure and heart failure (essentially cardiorenal syndrome). See initial consult note for list of her medical problems.    IMPRESSION: She had been treated in the ICU with a dobutamine drip and also lasix, and seemed to stabilize somewhat so this was [facility] and she was moved to the floor. During previous visits I have worked hard to try to get a HCPOA form completed (not done yet and may never be done as pt is less responsive these days).  I also worked hard to discuss end of life type of wishes of pt and pt herself told me a few days ago that she wanted DNR and DNI and no Feeding tube. BUT SHE WOULD LIKE DIALYSIS IF IT WOULD HELP HER.  This meant she was not quite ready for a more comfort oriented approach to her care. She was asked again by me about this and she was consistent in stating she wanted dialysis because that 'might help her live a few months longer'.    PLAN: Now, she is getting worse and worse by the day.  Not so much that she is imminently dying, but enough that she is not going to recover.  She has had diarrhea and I am worried that this could possibly be related to her diagnosis of LYMPHOMA treated by Dr Sherrlyn Hock in 2010.  If this were the case, then I think we would present this information to her and then hospice might seem more palatable to her.  She will need to hear that she is not going to recover fully soon anyway and I plan to talk with her soon about this --when she is awake.   REVIEW OF SYSTEMS:  Patient is not able to provide ROS due to illness  CODE STATUS: DNR   PAST MEDICAL HISTORY: Past Medical History  Diagnosis Date  . DM2 (diabetes mellitus, type 2)     onset age 26 insulin  dependent  . Hyperlipidemia   . COPD (chronic obstructive pulmonary disease)   . Peripheral vascular disease   . CAD (coronary artery disease)     a. MI 1988 s/p CABG in 1988, multiple PCI on SVGs; b. cath 2012: occluded native coronary arteries, patent LIMA to LAD (distal LAD dz), patent SVG-OM & occluded SVG-RCA which filled via left to right collats; c. cath 12/2013 patent LIMA-LAD & SVG-O. known occluded VG-RCA w/ L-R collats, no change since cath 2012  . Chronic systolic CHF (congestive heart failure)     a. 03/2012: EF 40-45%, mild lateral wall HK, mod inf HK, mod post wall HK, mild LVH, mild MR/TR   . Paroxysmal atrial fibrillation     a. CHADSVASc 7: yearly risk of CVA 9.6%;. b. on Eliquis 2.5 mg bid (age, SCr, borderline wt 62.9 Kg)   . HTN (hypertension)   . Yeast infection   . UTI (urinary tract infection)     Recurrent ESBL E.coli UTI  . Atherosclerosis of native artery of left lower extremity   . Myocardial infarction   . CKD (chronic kidney disease) stage 3, GFR 30-59 ml/min   . Hypoxia     on nocturnal o2    PAST SURGICAL HISTORY:  Past Surgical History  Procedure Laterality Date  . Abdominal  hysterectomy    . Coronary artery bypass graft    . Appendectomy    . Cholecystectomy    . Ovary surgery    . Angioplasty / stenting femoral      Bilateral SFA stenting  . Femur surgery    . Cardiac catheterization      mc  . Cardiac catheterization  12/2013  . Right hip replacement    . Total knee arthroplasty      right knee    Vital Signs: BP 107/46 mmHg  Pulse 54  Temp(Src) 97.8 F (36.6 C) (Oral)  Resp 19  Ht  (1.549 m)  Wt 80.786 kg (178 lb 1.6 oz)  BMI 33.67 kg/m2  SpO2 98% Filed Weights   11/27/14 0500 11/28/14 0500 11/29/14 0853  Weight: 81.9 kg (180 lb 8.9 oz) 81.103 kg (178 lb 12.8 oz) 80.786 kg (178 lb 1.6 oz)    Estimated body mass index is 33.67 kg/(m^2) as calculated from the following:   Height as of this encounter:  (1.549 m).    Weight as of this encounter: 80.786 kg (178 lb 1.6 oz).  PHYSICAL EXAM: She looks like a different person-- a very much more ill person She is sleeping with head of bed up. She barely opens eyes Hrt irreg irreg Lungs with decreased BS bases Abd soft and NT with INCREASED BS Skin is clammy and cool   LABS: CBC:    Component Value Date/Time   WBC 4.7 11/28/2014 0525   WBC 4.4 07/10/2014 1558   WBC 6.5 06/17/2014 0412   HGB 9.0* 11/28/2014 0525   HGB 9.5* 06/17/2014 0412   HCT 29.7* 11/28/2014 0525   HCT 27.6* 07/10/2014 1558   HCT 28.6* 06/17/2014 0412   PLT 238 11/28/2014 0525   PLT 228 06/17/2014 0412   MCV 81.2 11/28/2014 0525   MCV 94 06/17/2014 0412   NEUTROABS 3.9 11/09/2014 1434   NEUTROABS 5.5 06/17/2014 0412   NEUTROABS 2.5 01/17/2014 1647   LYMPHSABS 0.7* 11/09/2014 1434   LYMPHSABS 0.6* 06/17/2014 0412   LYMPHSABS 2.0 01/17/2014 1647   MONOABS 0.6 11/09/2014 1434   MONOABS 0.4 06/17/2014 0412   EOSABS 0.2 11/09/2014 1434   EOSABS 0.0 06/17/2014 0412   EOSABS 0.4 01/17/2014 1647   BASOSABS 0.1 11/09/2014 1434   BASOSABS 0.0 06/17/2014 0412   BASOSABS 0.0 01/17/2014 1647   Comprehensive Metabolic Panel:    Component Value Date/Time   NA 133* 11/29/2014 0921   NA 141 08/03/2014 1413   NA 140 06/16/2014 0215   K 4.7 11/29/2014 0921   K 4.3 06/16/2014 0215   CL 92* 11/29/2014 0921   CL 104 06/16/2014 0215   CO2 35* 11/29/2014 0921   CO2 25 06/16/2014 0215   BUN 71* 11/29/2014 0921   BUN 36* 08/03/2014 1413   BUN 40* 06/16/2014 0215   CREATININE 2.35* 11/29/2014 0921   CREATININE 1.42* 06/16/2014 0215   GLUCOSE 195* 11/29/2014 0921   GLUCOSE 155* 08/03/2014 1413   GLUCOSE 278* 06/16/2014 0215   CALCIUM 8.1* 11/29/2014 0921   CALCIUM 8.5* 06/16/2014 0215   AST 61* 11/19/2014 0448   AST 28 06/14/2014 1950   ALT 20 11/19/2014 0448   ALT 13* 06/14/2014 1950   ALKPHOS 80 11/19/2014 0448   ALKPHOS 62 06/14/2014 1950   BILITOT 0.9 11/19/2014 0448    BILITOT 0.3 06/14/2014 1950   PROT 5.7* 11/19/2014 0448   PROT 7.3 06/14/2014 1950   ALBUMIN 3.5 11/24/2014 0543  ALBUMIN 3.8 06/14/2014 1950   More than 50% of the visit was spent in counseling/coordination of care: YES  Time Spent: 45 min

## 2014-11-29 NOTE — Care Management Important Message (Signed)
Important Message  Patient Details  Name: [name] MRN: [id number] Date of Birth: 26-Jun-1931   Medicare Important Message Given:  Other (see comment) fifth notice    Eber HongGreene, Ivanell Deshotel R, RN 11/29/2014, 4:30 PM

## 2014-11-29 NOTE — Care Management (Signed)
Barrier to discharge is worsening renal status.  Very lethargic and little output.  Started on gentle hydration. Lasix on hold.  Being followed by nephrlogy.

## 2014-11-30 DIAGNOSIS — E1159 Type 2 diabetes mellitus with other circulatory complications: Secondary | ICD-10-CM

## 2014-11-30 DIAGNOSIS — R0902 Hypoxemia: Secondary | ICD-10-CM

## 2014-11-30 DIAGNOSIS — J449 Chronic obstructive pulmonary disease, unspecified: Secondary | ICD-10-CM

## 2014-11-30 DIAGNOSIS — I255 Ischemic cardiomyopathy: Secondary | ICD-10-CM

## 2014-11-30 LAB — BASIC METABOLIC PANEL
Anion gap: 10 (ref 5–15)
BUN: 71 mg/dL — AB (ref 6–20)
CALCIUM: 8 mg/dL — AB (ref 8.9–10.3)
CO2: 33 mmol/L — AB (ref 22–32)
CREATININE: 2.39 mg/dL — AB (ref 0.44–1.00)
Chloride: 91 mmol/L — ABNORMAL LOW (ref 101–111)
GFR calc Af Amer: 21 mL/min — ABNORMAL LOW (ref >60)
GFR calc non Af Amer: 18 mL/min — ABNORMAL LOW (ref >60)
GLUCOSE: 198 mg/dL — AB (ref 65–99)
Potassium: 4.4 mmol/L (ref 3.5–5.1)
Sodium: 134 mmol/L — ABNORMAL LOW (ref 135–145)

## 2014-11-30 LAB — CBC WITH DIFFERENTIAL/PLATELET
BASOS PCT: 1 %
Basophils Absolute: 0 10*3/uL (ref 0–0.1)
EOS ABS: 0.1 10*3/uL (ref 0–0.7)
Eosinophils Relative: 3 %
HEMATOCRIT: 29.9 % — AB (ref 35.0–47.0)
Hemoglobin: 9 g/dL — ABNORMAL LOW (ref 12.0–16.0)
Lymphocytes Relative: 15 %
Lymphs Abs: 0.8 10*3/uL — ABNORMAL LOW (ref 1.0–3.6)
MCH: 24.6 pg — ABNORMAL LOW (ref 26.0–34.0)
MCHC: 30 g/dL — AB (ref 32.0–36.0)
MCV: 82.1 fL (ref 80.0–100.0)
MONO ABS: 0.7 10*3/uL (ref 0.2–0.9)
MONOS PCT: 14 %
NEUTROS ABS: 3.6 10*3/uL (ref 1.4–6.5)
Neutrophils Relative %: 67 %
Platelets: 225 10*3/uL (ref 150–440)
RBC: 3.64 MIL/uL — ABNORMAL LOW (ref 3.80–5.20)
RDW: 26.9 % — AB (ref 11.5–14.5)
WBC: 5.3 10*3/uL (ref 3.6–11.0)

## 2014-11-30 LAB — GLUCOSE, CAPILLARY
GLUCOSE-CAPILLARY: 209 mg/dL — AB (ref 65–99)
Glucose-Capillary: 221 mg/dL — ABNORMAL HIGH (ref 65–99)
Glucose-Capillary: 233 mg/dL — ABNORMAL HIGH (ref 65–99)
Glucose-Capillary: 216 mg/dL — ABNORMAL HIGH (ref 65–99)

## 2014-11-30 LAB — STOOL CULTURE

## 2014-11-30 MED ORDER — VANCOMYCIN 50 MG/ML ORAL SOLUTION
125.0000 mg | Freq: Four times a day (QID) | ORAL | Status: DC
  Administered 2014-11-30 – 2014-12-01 (×4): 125 mg via ORAL
  Filled 2014-11-30 (×7): qty 2.5

## 2014-11-30 MED ORDER — INSULIN DETEMIR 100 UNIT/ML ~~LOC~~ SOLN
4.0000 [IU] | Freq: Every day | SUBCUTANEOUS | Status: DC
  Filled 2014-11-30 (×2): qty 0.04

## 2014-11-30 MED ORDER — INSULIN DETEMIR 100 UNIT/ML ~~LOC~~ SOLN
4.0000 [IU] | Freq: Every day | SUBCUTANEOUS | Status: DC
  Administered 2014-11-30 – 2014-12-03 (×4): 4 [IU] via SUBCUTANEOUS
  Filled 2014-11-30 (×5): qty 0.04

## 2014-11-30 MED ORDER — INSULIN DETEMIR 100 UNIT/ML ~~LOC~~ SOLN: 4.0000 [IU] | Freq: Every day | SUBCUTANEOUS | Status: DC

## 2014-11-30 NOTE — Evaluation (Signed)
Occupational Therapy Evaluation Patient Details Name: [name] MRN: [id number] DOB: 1931-05-08 Today's Date: 11/30/2014    History of Present Illness Pt with past medical history of chronic systolic congestive heart failure, DJD with patient being bedbound for last 6 months, type 2 diabetes, hypertension, A. fib on a liquid comes to the emergency room on 9/22 after she was found to have elevated creatinine on her routine lab work that was done at her nursing home. Pt recently transfered to step-down where she now has new OT order to continue treatment.    Clinical Impression   Patient is a 81 yo female who lived previously with her granddaughter a few months ago and then moved to [facility] with progressive weakness and need for higher level of care.  She presents now with muscle weakness, decreased transfers, decreased functional mobility and decreased ability to perform self care tasks.  She would benefit from skilled OT to maximize her safety and independence in daily tasks.      Follow Up Recommendations  SNF    Equipment Recommendations       Recommendations for Other Services       Precautions / Restrictions Precautions Precautions: Fall Precaution Comments: Enteric isolation.  Restrictions Weight Bearing Restrictions: No      Balance     ADL Overall ADL's : Needs assistance/impaired Eating/Feeding: Set up   Grooming: Set up;Minimal assistance   Upper Body Bathing: Set up;Moderate assistance   Lower Body Bathing: Set up;Maximal assistance   Upper Body Dressing : Set up;Moderate assistance   Lower Body Dressing: Set up;Total assistance                 General ADL Comments: Patient reports she hasn't walked in over 3 months.     Vision     Perception     Praxis      Pertinent Vitals/Pain Pain Assessment: 0-10 Pain Score: 9  Pain Location: back Pain Descriptors / Indicators: Aching Pain Intervention(s): Limited activity within  patient's tolerance;Repositioned;Monitored during session     Hand Dominance Right   Extremity/Trunk Assessment Upper Extremity Assessment Upper Extremity Assessment: Generalized weakness   Lower Extremity Assessment Lower Extremity Assessment: Generalized weakness       Communication Communication Communication: No difficulties   Cognition Arousal/Alertness: Awake/alert Behavior During Therapy: WFL for tasks assessed/performed;Anxious Overall Cognitive Status: Within Functional Limits for tasks assessed                     General Comments       Exercises       Shoulder Instructions      Home Living Family/patient expects to be discharged to:: Skilled nursing facility Living Arrangements: Other relatives Available Help at Discharge: Family Type of Home: House                       Home Equipment: Wheelchair - manual;Hospital bed;Walker - 2 wheels;Bedside commode;Shower seat;Grab bars - tub/shower   Additional Comments: Pt most recently at [facility] where she was receiving PT services and working on standing activity tolerance.      Prior Functioning/Environment Level of Independence: Needs assistance  Gait / Transfers Assistance Needed: Lift transfer from bed to w/c since at SNF; when living with granddaughter pt able to stand pivot to chair.     Comments: Patient reports she was living with her grand daughter a few months ago and was mostly independent with her self care then got progressively  worse and transferred to [facility] where she was receiving therapy.    OT Diagnosis: Generalized weakness;Other (comment);Acute pain (decreased ability to perform self care)   OT Problem List: Decreased strength;Pain;Decreased range of motion;Decreased activity tolerance;Decreased knowledge of use of DME or AE   OT Treatment/Interventions: Self-care/ADL training;Therapeutic exercise;Patient/family education;DME and/or AE instruction    OT  Goals(Current goals can be found in the care plan section) Acute Rehab OT Goals Patient Stated Goal: Patient wants to be able to help with her self care tasks. OT Goal Formulation: With patient Time For Goal Achievement: 12/09/14 Potential to Achieve Goals: Good  OT Frequency: Min 1X/week   Barriers to D/C:            Co-evaluation              End of Session    Activity Tolerance: Patient limited by pain;Patient limited by fatigue Patient left: in chair;with call bell/phone within reach   Time: 1531-1558 OT Time Calculation (min): 27 min Charges:  OT General Charges $OT Visit: 1 Procedure OT Evaluation $Initial OT Evaluation Tier I: 1 Procedure OT Treatments $Self Care/Home Management : 8-22 mins G-Codes:    [name] T Lovett, OTR/L, CLT Lovett,[name] 11/30/2014, 4:10 PM

## 2014-11-30 NOTE — Progress Notes (Signed)
   11/30/14 1330  Clinical Encounter Type  Visited With Patient and family together  Visit Type Follow-up  Referral From Chaplain  Consult/Referral To Chaplain  Spiritual Encounters  Spiritual Needs Literature  Stress Factors  Patient Stress Factors Health changes  Advance Directives (For Healthcare)  Does patient have an advance directive? No  Would patient like information on creating an advanced directive? Yes - Scientist, clinical (histocompatibility and immunogenetics) given  Type of Paramedic of [place];Living will  Does patient want to make changes to advanced directive? Yes - information given  Met w/patient & family. Patient advised that previous information had been lost. Gave patient another copy. Daughter took the AD and will complete it at home. She will have on-call chaplain paged Saturday morning to complete the instruments. Chap. Khylee Algeo G. Chance

## 2014-11-30 NOTE — Progress Notes (Signed)
[facility]   PATIENT NAME: [name]    MR#:  [id number]  DATE OF BIRTH:  03-10-1931  SUBJECTIVE:   Continues to be SOB.  Not feeling well Poor UOP. Oliguric range Diarrhea for 5 days now. C diff toxin negative. But antigen positive  REVIEW OF SYSTEMS:    Review of Systems  Constitutional: Positive for malaise/fatigue. Negative for fever and chills.  HENT: Negative for sore throat.   Eyes: Negative for blurred vision.  Respiratory: Positive for shortness of breath. Negative for cough, hemoptysis and wheezing.   Cardiovascular: Negative for chest pain, palpitations and leg swelling (Anasarca).  Gastrointestinal: Positive for diarrhea. Negative for nausea, vomiting, abdominal pain and blood in stool.  Genitourinary: Negative for dysuria.  Musculoskeletal: Negative for back pain.  Neurological: Negative for dizziness, tremors and headaches.  Endo/Heme/Allergies: Does not bruise/bleed easily.    Tolerating Diet: Yes DRUG ALLERGIES:   Allergies  Allergen Reactions  . Contrast Media [Iodinated Diagnostic Agents] Shortness Of Breath  . Tramadol Nausea Only  . Valium [Diazepam] Other (See Comments)    Reaction:  Elevated heart rate  . Iodine Strong [Iodine] Rash  . Penicillins Rash   VITALS:  Blood pressure 105/54, pulse 63, temperature 97.6 F (36.4 C), temperature source Oral, resp. rate 16, height  (1.549 m), weight 81.92 kg (180 lb 9.6 oz), SpO2 97 %. PHYSICAL EXAMINATION:   Physical Exam   GENERAL:  81 y.o.-year-old patient lying in the bed with no acute distress.  EYES: Pupils equal, round, reactive to light and accommodation. No scleral icterus. Extraocular muscles intact.  HEENT: Head atraumatic, normocephalic. Oropharynx and nasopharynx clear.  NECK:  Supple, no jugular venous distention. No thyroid enlargement, no tenderness.  LUNGS: Normal work of breathing. Poor air entry bilaterally.  Crackles. CARDIOVASCULAR: S1, S2 normal. No murmurs, rubs, or gallops.  ABDOMEN: Soft, nontender, nondistended. Bowel sounds present. No organomegaly or mass.  EXTREMITIES: No cyanosis, clubbing.    NEUROLOGIC: Cranial nerves II through XII are intact. No focal Motor or sensory deficits b/l.   PSYCHIATRIC: The patient is alert and oriented x 3.  SKIN: No obvious rash, lesion, or ulcer.  Generalized anasarca.  LABORATORY PANEL:   CBC  Recent Labs Lab 11/30/14 0406  WBC 5.3  HGB 9.0*  HCT 29.9*  PLT 225   ------------------------------------------------------------------------------------------------------------------  Chemistries   Recent Labs Lab 11/30/14 0406  NA 134*  K 4.4  CL 91*  CO2 33*  GLUCOSE 198*  BUN 71*  CREATININE 2.39*  CALCIUM 8.0*    ASSESSMENT AND PLAN:   81 year old female with a history of chronic systolic heart failure, DJD with functional quadriplegia, type 2 diabetes and hypertension who presented with elevation in her creatinine and edema.  * Diarrhea C diff antigen is positive but toxin is negative. Afebrile. Normal WBC. Will start po vanc per new cdiff guidelines and d/c Flagyl due to ongoing diarrhea and recent abx use  * Acute on chronic systolic heart failure with anasarca: Her echocardiogram showed EF of 20-25% with hypokinesis of several areas. 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 Apreciate cardio input.  Monitor input and output. Needs further inpatient diuresis but her Cr is worsening. Hold lasix today.  * Chronic respiratory failure is stable  * Acute renal failure on CKD 4 Patient's creatinine had improved but now with diuresis Cr is worsening Hold lasix Dr. Cherylann Ratel has started 50 ml/hr NS , recommending to continue ivf until tomorrow  * ESBL urinary tract  infection Was on Invanz Abx finished for 10 days and stopped  * Anemia of chronic disease Stable No need for transfusion  * CAD status post CABG: Patient is not on ACE  inhibitor/ARB due to hypotension and acute renal failure.   * Atrial fibrillation: heart rate is controlled. Patient will continue on Eliquis and amiodarone.  * Diabetes type 2: Continue Lantus and sliding scale insulin.  * Hypothyroidism   On Levothyroxin  She lives at [facility] and will likely need rehab on D/C  Poor prognosis due to multiple comorbidities including ejection fraction of only 20%. Now has acute renal failure. Likely cardiorenal syndrome. Patient's CODE STATUS has been changed to DO NOT RESUSCITATE by palliative care yesterday. She will likely need hospice services at discharge. May end up needing dialysis if her anasarca worsens along with the renal failure.  CODE STATUS: DNR  TOTAL TIME TAKING CARE OF THIS PATIENT: 35 minutes.    Ramonita LabGouru, Desta Bujak M.D on 11/30/2014 at 11:14 PM  Between 7am to 6pm - Pager - [phone] After 6pm go to [email or web address] - password [email or web address] [facility]  [facility]  Office  [phone]  CC: Primary care physician; Fidel LevyJames Hawkins Jr, MD

## 2014-11-30 NOTE — Progress Notes (Signed)
Patient: [name] / Admit Date: 11/09/2014 / Date of Encounter: 11/30/2014, 10:06 AM   Subjective: Feels better this morning than yesterday ate some food last night, lemonade, [employer] sandwich Reports having less diarrhea today Nurses have been giving Colace, this has been held this  Morning Reports having tender legs, stomach feels tight  Review of Systems: ROS Constitutional: Positive for malaise/fatigue. Negative for fever, weight loss and diaphoresis.   Anorexia  HENT: Negative for congestion.  Eyes: Negative for discharge and redness.  Respiratory: Negative. Negative for cough, hemoptysis, sputum production and wheezing.  Cardiovascular: Negative. Negative for chest pain, palpitations, orthopnea, claudication and PND.  Gastrointestinal: Negative. Negative for vomiting.  Neurological: Positive for weakness. Negative for dizziness, tingling, tremors, sensory change, speech change, focal weakness and loss of consciousness.  Psychiatric/Behavioral: The patient is not nervous/anxious.   Objective: Telemetry: NSR Physical Exam: Blood pressure 123/59, pulse 57, temperature 97.4 F (36.3 C), temperature source Oral, resp. rate 20, height  (1.549 m), weight 180 lb 9.6 oz (81.92 kg), SpO2 100 %. Body mass index is 34.14 kg/(m^2). General: Well developed, well nourished, in no acute distress. Head: Normocephalic, atraumatic, sclera non-icteric, no xanthomas, nares are without discharge. Neck: Negative for carotid bruits. JVP not elevated. Lungs: Clear bilaterally to auscultation without wheezes, rales, or rhonchi. Breathing is unlabored. Heart: RRR S1 S2 without murmurs, rubs, or gallops.  Abdomen: Soft, non-tender, non-distended with normoactive bowel sounds. No rebound/guarding. Extremities: No clubbing or cyanosis. No edema. Distal pedal pulses are 2+ and equal bilaterally. Neuro: Alert and oriented X 3. Moves all extremities spontaneously. Psych:   Responds to questions appropriately with a normal affect.   Intake/Output Summary (Last 24 hours) at 11/30/14 1006 Last data filed at 11/30/14 0830  Gross per 24 hour  Intake  262.5 ml  Output      0 ml  Net  262.5 ml    Inpatient Medications:  . acidophilus  1 capsule Oral BID  . amiodarone  200 mg Oral Daily  . antiseptic oral rinse  7 mL Mouth Rinse BID  . apixaban  2.5 mg Oral BID  . atorvastatin  40 mg Oral q1800  . gabapentin  300 mg Oral QHS  . insulin aspart  0-5 Units Subcutaneous QHS  . insulin aspart  0-9 Units Subcutaneous TID WC  . levothyroxine  100 mcg Oral QAC breakfast  . lubiprostone  24 mcg Oral BID WC  . metroNIDAZOLE  500 mg Oral 3 times per day  . nystatin cream   Topical BID  . pantoprazole  40 mg Oral Daily  . sodium chloride  3 mL Intravenous Q12H   Infusions:  . sodium chloride 50 mL/hr at 11/29/14 1445    Labs:  Recent Labs  11/29/14 0921 11/30/14 0406  NA 133* 134*  K 4.7 4.4  CL 92* 91*  CO2 35* 33*  GLUCOSE 195* 198*  BUN 71* 71*  CREATININE 2.35* 2.39*  CALCIUM 8.1* 8.0*   No results for input(s): AST, ALT, ALKPHOS, BILITOT, PROT, ALBUMIN in the last 72 hours.  Recent Labs  11/28/14 0525 11/30/14 0406  WBC 4.7 5.3  NEUTROABS  --  3.6  HGB 9.0* 9.0*  HCT 29.7* 29.9*  MCV 81.2 82.1  PLT 238 225   No results for input(s): CKTOTAL, CKMB, TROPONINI in the last 72 hours. Invalid input(s): POCBNP No results for input(s): HGBA1C in the last 72 hours.   Weights: Filed Weights   11/28/14 0500 11/29/14 1610  11/30/14 9604  Weight: 178 lb 12.8 oz (81.103 kg) 178 lb 1.6 oz (80.786 kg) 180 lb 9.6 oz (81.92 kg)     Radiology/Studies:  Dg Chest 1 View  11/22/2014  CLINICAL DATA: IMPRESSION: Mild to moderate CHF.  Suspect small layering effusions. Electronically Signed   By: Charlett Nose M.D.   On: 11/22/2014 08:35   Dg Chest 1 View  11/12/2014  CLINICAL DATA:IMPRESSION: Right PICC line tip at the cavoatrial junction.  Cardiomegaly. New diffuse bilateral airspace disease concerning for edema/CHF. Electronically Signed   By: Charlett Nose M.D.   On: 11/12/2014 14:28   Dg Chest 2 View  11/25/2014   IMPRESSION: 1. Stable mild congestive heart failure. 2. Stable small right and moderate left pleural effusions with associated bibasilar lung opacities likely representing atelectasis. Electronically Signed   By: Delbert Phenix M.D.   On: 11/25/2014 10:07   US Renal  11/11/2014  IMPRESSION: Increased echotexture within the kidneys bilaterally suggesting chronic medical renal disease. No hydronephrosis. Ascites. Electronically Signed   By: Charlett Nose M.D.   On: 11/11/2014 09:48   Dg Chest Port 1 View  11/15/2014  CLINICAL DATA: IMPRESSION: 1. Findings suggest resolving pulmonary edema.  Stable cardiomegaly. 2. Tip of the right upper extremity PICC in the distal SVC. Electronically Signed   By: Rubye Oaks M.D.   On: 11/15/2014 21:58   Dg Chest Port 1 View  11/09/2014  CLINICAL DATA: IMPRESSION: Left basilar opacity is noted concerning for pneumonia or atelectasis with associated pleural effusion. Mild right basilar opacity is noted concerning for subsegmental atelectasis or pleural effusion. Electronically Signed   By: Lupita Raider, M.D.   On: 11/09/2014 14:58     Assessment and Plan   81 y.o. female with h/o CAD s/p CABG s/p PCI on SVGs, ischemic cardiomyopathy/chronic systolic CHF, PAF on Eliquis, PVD, COPD requiring nocturnal oxygen, CKD stage III, IDDM, HLD, and HTN who has been admitted recently to [facility] in mid June and mid July for ESBL UTI, HCAP and acute on chronic systolic CHF; mid August for ileus; late August for HCAP again; who presented to [facility] on 9/22 with from [facility] with acute on chronic CKD. Cardiology was consulted on 10/4 with acute on chronic systolic CHF in the setting of newly depressed EF of 20-25% by echo on 11/14/2014.  1. Acute on chronic systolic CHF/ICM: Ejection fraction 20-25% by  echo 9/27. Previously treated with pressors/inotropes with improvement of her renal function and with diuresis however, following several days of diarrhea and anorexia, her creatinine has been climbing  --- Given low fluid intake, small by mouth fluid intake, continue diarrhea, climb in her creatinine, would be inclined to hold her diuretic. She's been started on low-dose fluids given her climb in renal function and low by mouth intake. --Unable to exclude ATN  2. Acute on CKD stage IV: Creat worsening since 10/8 in the setting of diarrhea, low oral fluid int. -Diuretics on hold. --Unable to exclude ATN in the setting of diarrhea, possible C.Diff  3. PAF: Currently in sinus rhythm -Cont carvedilol, amiodarone, Eliquis 2.5 mg bid (adjusted 2/2 age/creat). -CHADSVASc at least 7 (CHF, HTN, DM, vascular disease, age x 2, female)  4. CAD s/p CABG and PCI as above: No angina. Trop 0.05 on admission. Severe native disease on cardiac catheterization in 05/2014 with patent LIMA  LAD and VG  OM - stable since 12/2013 cath. New LV dysfxn raises concern for occlusion of a graft however low level  troponin argues against this. Given multiple comorbidities, she is not a good candidate for further ischemic workup at this time, especially in light of acute on chronic renal failure. -On Eliquis in place of aspirin  -Cont bb/statin.   5. DM2: -SSI per IM  Signed, Dossie Arbourim Aliceson Dolbow, MD [facility] 11/30/2014, 10:06 AM

## 2014-11-30 NOTE — Progress Notes (Signed)
Patient is from [facility] for STR.  Plan is for patient to return once she is medically stable.  Barrier to patient discharge at this time is her worsening renal status.  Clinical Social Worker will continue to follow patient with Care Manger and assist with ongoing and disposition needs.   Sammuel Hineseborah Karol Skarzynski. Theresia MajorsLCSWA, MSW Clinical Social Work Department [phone] 12:57 PM

## 2014-11-30 NOTE — Care Management (Signed)
Spoke with cardiology regarding renal status.  This is related to patient's heart and dialysis would not resolve renal issues.  Patient is now DNR.  Have paged attending and palliative care to discuss goals of treatment as it is felt that condition is declining.  Palliative care is very much aware to plans to  Speak with patient.

## 2014-11-30 NOTE — Progress Notes (Signed)
Subjective:  Pt seen at bedside.  Cr about the same despite hydration.  Still states shes not feeling well, thought slightly better than the past several days. Dr. Mariah Milling also following closely.  Objective:  Vital signs in last 24 hours:  Temp:  [97.2 F (36.2 C)-97.4 F (36.3 C)] 97.2 F (36.2 C) (10/13 1014) Pulse Rate:  [56-57] 57 (10/13 1014) Resp:  [20-23] 20 (10/13 1014) BP: (111-125)/(51-60) 125/60 mmHg (10/13 1014) SpO2:  [98 %-100 %] 100 % (10/13 1014) Weight:  [81.92 kg (180 lb 9.6 oz)] 81.92 kg (180 lb 9.6 oz) (10/13 0454)  Weight change:  Filed Weights   11/28/14 0500 11/29/14 0853 11/30/14 0638  Weight: 81.103 kg (178 lb 12.8 oz) 80.786 kg (178 lb 1.6 oz) 81.92 kg (180 lb 9.6 oz)    Intake/Output:    Intake/Output Summary (Last 24 hours) at 11/30/14 1648 Last data filed at 11/30/14 1130  Gross per 24 hour  Intake  262.5 ml  Output      0 ml  Net  262.5 ml     Physical Exam: General:  elderly, frail, NAD  HEENT  moist mucous membranes, anicteric sclera   Neck  supple   Pulm/lungs  mild basilar crackles, normal effort  CVS/Heart  S1S2 no rubs  Abdomen:   soft, nontender, nondistended   Extremities: 1+ dependent edema   Neurologic:  alert, oriented, speech normal   Skin:  no acute rashes, skin breakdown over shins      GU  Foley in place     Basic Metabolic Panel:   Recent Labs Lab 11/24/14 0543 11/25/14 0438 11/27/14 0600 11/28/14 0525 11/29/14 0921 11/30/14 0406  NA 139 139 135 135 133* 134*  K 4.1 4.4 4.5 4.8 4.7 4.4  CL 95* 95* 94* 90* 92* 91*  CO2 37* 37* 36* 34* 35* 33*  GLUCOSE 56* 62* 167* 145* 195* 198*  BUN 62* 58* 67* 70* 71* 71*  CREATININE 1.65* 1.64* 2.05* 2.12* 2.35* 2.39*  CALCIUM 8.1* 8.4* 8.0* 8.2* 8.1* 8.0*  PHOS 3.9  --   --   --   --   --      CBC:  Recent Labs Lab 11/25/14 0438 11/28/14 0525 11/30/14 0406  WBC 5.3 4.7 5.3  NEUTROABS  --   --  3.6  HGB 8.5* 9.0* 9.0*  HCT 27.5* 29.7* 29.9*  MCV 80.9  81.2 82.1  PLT 218 238 225      Microbiology:  Recent Results (from the past 720 hour(s))  Blood Culture (routine x 2)     Status: None   Collection Time: 11/09/14  2:34 PM  Result Value Ref Range Status   Specimen Description BLOOD RIGHT ARM  Final   Special Requests BOTTLES DRAWN AEROBIC AND ANAEROBIC 3CC  Final   Culture NO GROWTH 5 DAYS  Final   Report Status 11/14/2014 FINAL  Final  Urine culture     Status: None   Collection Time: 11/09/14  2:34 PM  Result Value Ref Range Status   Specimen Description URINE, CLEAN CATCH  Final   Special Requests NONE  Final   Culture   Final    >=100,000 COLONIES/mL ESCHERICHIA COLI ESBL-EXTENDED SPECTRUM BETA LACTAMASE-THE ORGANISM IS RESISTANT TO PENICILLINS, CEPHALOSPORINS AND AZTREONAM ACCORDING TO CLSI M100-S15 VOL.25 N01 JAN 2005. CRITICAL RESULT CALLED TO, READ BACK BY AND VERIFIED WITH: DANIEL JACOBS,RN 11/11/2014 0909 JRS.    Report Status 11/11/2014 FINAL  Final   Organism ID, Bacteria ESCHERICHIA COLI  Final      Susceptibility   Escherichia coli - MIC*    AMPICILLIN >=32 RESISTANT Resistant     CEFAZOLIN >=64 RESISTANT Resistant     CEFTRIAXONE >=64 RESISTANT Resistant     CIPROFLOXACIN >=4 RESISTANT Resistant     GENTAMICIN <=1 SENSITIVE Sensitive     IMIPENEM <=0.25 SENSITIVE Sensitive     NITROFURANTOIN <=16 SENSITIVE Sensitive     TRIMETH/SULFA >=320 RESISTANT Resistant     Extended ESBL POSITIVE Resistant     PIP/TAZO Value in next row Sensitive      SENSITIVE8    LEVOFLOXACIN Value in next row Resistant      RESISTANT>=8    * >=100,000 COLONIES/mL ESCHERICHIA COLI  MRSA PCR Screening     Status: None   Collection Time: 11/09/14  6:24 PM  Result Value Ref Range Status   MRSA by PCR NEGATIVE NEGATIVE Final    Comment:        The GeneXpert MRSA Assay (FDA approved for NASAL specimens only), is one component of a comprehensive MRSA colonization surveillance program. It is not intended to diagnose  MRSA infection nor to guide or monitor treatment for MRSA infections.   MRSA PCR Screening     Status: None   Collection Time: 11/21/14  2:04 PM  Result Value Ref Range Status   MRSA by PCR NEGATIVE NEGATIVE Final    Comment:        The GeneXpert MRSA Assay (FDA approved for NASAL specimens only), is one component of a comprehensive MRSA colonization surveillance program. It is not intended to diagnose MRSA infection nor to guide or monitor treatment for MRSA infections.   C difficile quick scan w PCR reflex     Status: Abnormal   Collection Time: 11/24/14 11:01 PM  Result Value Ref Range Status   C Diff antigen POSITIVE (A) NEGATIVE Final   C Diff toxin NEGATIVE NEGATIVE Final   C Diff interpretation   Final    Negative for toxigenic C. difficile. Toxin gene and active toxin production not detected. May be a nontoxigenic strain of C. difficile bacteria present, lacking the ability to produce toxin.  Clostridium Difficile by PCR     Status: None   Collection Time: 11/24/14 11:01 PM  Result Value Ref Range Status   Toxigenic C Difficile by pcr NEGATIVE NEGATIVE Final  Stool culture     Status: None   Collection Time: 11/27/14  9:00 PM  Result Value Ref Range Status   Specimen Description STOOL  Final   Special Requests NONE  Final   Culture   Final    NO SALMONELLA OR SHIGELLA ISOLATED No Pathogenic E. coli detected NO CAMPYLOBACTER DETECTED    Report Status 11/30/2014 FINAL  Final    Coagulation Studies: No results for input(s): LABPROT, INR in the last 72 hours.  Urinalysis: No results for input(s): COLORURINE, LABSPEC, PHURINE, GLUCOSEU, HGBUR, BILIRUBINUR, KETONESUR, PROTEINUR, UROBILINOGEN, NITRITE, LEUKOCYTESUR in the last 72 hours.  Invalid input(s): APPERANCEUR    Imaging: No results found.   Medications:   . sodium chloride 50 mL/hr at 11/30/14 1021   . acidophilus  1 capsule Oral BID  . amiodarone  200 mg Oral Daily  . antiseptic oral rinse  7  mL Mouth Rinse BID  . apixaban  2.5 mg Oral BID  . atorvastatin  40 mg Oral q1800  . gabapentin  300 mg Oral QHS  . insulin aspart  0-5 Units Subcutaneous QHS  . insulin aspart  0-9 Units Subcutaneous TID WC  . insulin detemir  4 Units Subcutaneous QHS  . levothyroxine  100 mcg Oral QAC breakfast  . nystatin cream   Topical BID  . sodium chloride  3 mL Intravenous Q12H  . vancomycin  125 mg Oral QID   acetaminophen **OR** acetaminophen, albuterol, HYDROcodone-acetaminophen, loperamide, nitroGLYCERIN, ondansetron **OR** ondansetron (ZOFRAN) IV, polyethylene glycol, promethazine, simethicone, zolpidem  Assessment/ Plan:  81 y.o. female with a PMHX of chronic systolic congestive heart failure, type 2 diabetes, hypertension, atrial fibrillation, DJD, COPD, coronary disease/CABG 1988, catheter 2015, recurrent UTIs, chronic kidney disease, who [facility] for evaluation of elevated creatinine above her baseline and increased swelling.   1. Acute renal failure on chronic kidney disease stage 4 . Patient's baseline creatinine is 1.66/GFR of 28. CKD is likely secondary to atherosclerosis and hypertension.  Acute renal failure from acute cardiorenal syndrome. Had with dobutamine, dopamine, albumin and furosemide previously - Cr not improving has hoped with hydration, will continue hydration for one additional day and then stop.  Follow Cr and UOP trend daily.    2. Hypertension and Anasarca/edema. From systolic congestive heart failure acute exacerbation. - still has edema, but doesn't appear to have worsening pulmonary status.  Will continue cautious hydration for now.  Will likely stop IVFs tomorrow.      LOS: 21 Lenea Bywater 10/13/20164:48 PM

## 2014-11-30 NOTE — Progress Notes (Signed)
Inpatient Diabetes Program Recommendations  AACE/ADA: New Consensus Statement on Inpatient Glycemic Control (2015)  Target Ranges:  Prepandial:   less than 140 mg/dL      Peak postprandial:   less than 180 mg/dL (1-2 hours)      Critically ill patients:  140 - 180 mg/dL  Results for [name], [name] (MRN [id number]) as of 11/30/2014 09:55  Ref. Range 11/29/2014 07:42 11/29/2014 11:52 11/29/2014 17:24 11/29/2014 21:04 11/30/2014 08:14  Glucose-Capillary Latest Ref Range: 65-99 mg/dL 119176 (H) 147220 (H) 829240 (H) 250 (H) 221 (H)   Review of Glycemic Control  Current orders for Inpatient glycemic control: Novolog 0-9 units TID with meals, Novolog 0-5 units HS  Inpatient Diabetes Program Recommendations: Insulin - Basal: Glucose ranged from 176-250 mg/dl on 56/21/3009/02/01 and fasting glucose is 221 mg/dl this morning. Please consider ordering low dose basal insulin. Recommend starting with Levemir 4 units Q24H starting now (based on 81 kg x 0.05 units).  Thanks, Orlando PennerMarie Vash Quezada, RN, MSN, CCRN, CDE Diabetes Coordinator Inpatient Diabetes Program [phone] (Team Pager from 8am to 5pm) [phone] ([facility] office) [phone] [facility] office) [phone] [facility] office)'

## 2014-11-30 NOTE — Progress Notes (Addendum)
Physical Therapy Treatment Patient Details Name: Sara DemarkShirley A [name] MRN: [id number] DOB: [birth year] Today's Date: 11/30/2014    History of Present Illness Pt with past medical history of chronic systolic congestive heart failure, DJD with patient being bedbound for last 6 months, type 2 diabetes, hypertension, A. fib on a liquid comes to the emergency room on 9/22 after she was found to have elevated creatinine on her routine lab work that was done at her nursing home. Pt recently transfered to step-down where she now has new PT order to continue treatment.     PT Comments    Pt notes she continues to feel lousy, but agreeable to up in chair. Noted, pt has not eaten lunch, nor does she want snack brought during session. Pt only wishes ice. Pt Max to total assist for up in chair with 2 person physical assist. Pt comfortable up in chair positioned with pillows and partially reclined, but too fatigued for further exercise. Pt non ambulatory at baseline and agrees to call nursing when she wishes back to bed. Suggested use of sabina lift back to bed if uncomfortable with 2 person assist back to bed. Pt demonstrating improvement with out of bed activity today; continue PT for progression of strength and endurance to improve functional mobility and decrease level of assist required.   Follow Up Recommendations  SNF     Equipment Recommendations  Rolling walker with 5" wheels    Recommendations for Other Services       Precautions / Restrictions Restrictions Weight Bearing Restrictions: No    Mobility  Bed Mobility Overal bed mobility: Needs Assistance;+2 for physical assistance Bed Mobility: Supine to Sit     Supine to sit: Max assist;+2 for physical assistance     General bed mobility comments:  (Max A x 2 to scoot to edge of bed supine to ease Max A sit)  Transfers Overall transfer level: Needs assistance Equipment used: None Transfers: Squat Pivot Transfers     Squat pivot  transfers: Total assist;+2 physical assistance     General transfer comment:  (pt mildly anxious; cues to reassure/ant'r trunk lean to A)  Ambulation/Gait             General Gait Details: pt non ambulatory prior to admission   Stairs            Wheelchair Mobility    Modified Rankin (Stroke Patients Only)       Balance Overall balance assessment: Needs assistance Sitting-balance support: Bilateral upper extremity supported Sitting balance-Leahy Scale: Poor Sitting balance - Comments:  (posterior lean requiring Min A ) Postural control: Posterior lean                          Cognition Arousal/Alertness: Awake/alert Behavior During Therapy: WFL for tasks assessed/performed;Anxious (anxious once sitting edge of bed for transfer) Overall Cognitive Status: Within Functional Limits for tasks assessed                      Exercises      General Comments        Pertinent Vitals/Pain      Home Living                      Prior Function            PT Goals (current goals can now be found in the care plan section) Progress towards PT goals: Progressing toward  goals    Frequency  Min 2X/week    PT Plan Current plan remains appropriate    Co-evaluation             End of Session Equipment Utilized During Treatment: Oxygen Activity Tolerance: Patient limited by fatigue;Patient limited by pain (weakness) Patient left: in chair;with call bell/phone within reach (no chair alarm; pt nonambulatory, nursing aware)     Time: 1610-9604 PT Time Calculation (min) (ACUTE ONLY): 16 min  Charges:  $Therapeutic Activity: 8-22 mins                    G Codes:      Kristeen Miss 11/30/2014, 3:26 PM

## 2014-12-01 DIAGNOSIS — E785 Hyperlipidemia, unspecified: Secondary | ICD-10-CM

## 2014-12-01 LAB — BASIC METABOLIC PANEL
ANION GAP: 6 (ref 5–15)
BUN: 68 mg/dL — ABNORMAL HIGH (ref 6–20)
CALCIUM: 7.9 mg/dL — AB (ref 8.9–10.3)
CO2: 34 mmol/L — ABNORMAL HIGH (ref 22–32)
Chloride: 95 mmol/L — ABNORMAL LOW (ref 101–111)
Creatinine, Ser: 2.03 mg/dL — ABNORMAL HIGH (ref 0.44–1.00)
GFR calc Af Amer: 25 mL/min — ABNORMAL LOW (ref >60)
GFR, EST NON AFRICAN AMERICAN: 22 mL/min — AB (ref >60)
GLUCOSE: 154 mg/dL — AB (ref 65–99)
Potassium: 4.6 mmol/L (ref 3.5–5.1)
SODIUM: 135 mmol/L (ref 135–145)

## 2014-12-01 LAB — GLUCOSE, CAPILLARY
GLUCOSE-CAPILLARY: 207 mg/dL — AB (ref 65–99)
GLUCOSE-CAPILLARY: 265 mg/dL — AB (ref 65–99)
Glucose-Capillary: 142 mg/dL — ABNORMAL HIGH (ref 65–99)
Glucose-Capillary: 279 mg/dL — ABNORMAL HIGH (ref 65–99)

## 2014-12-01 LAB — BLOOD GAS, VENOUS
Acid-Base Excess: 6.4 mmol/L — ABNORMAL HIGH (ref 0.0–3.0)
BICARBONATE: 35.4 meq/L — AB (ref 21.0–28.0)
PH VEN: 7.3 — AB (ref 7.320–7.430)
Patient temperature: 37
pCO2, Ven: 72 mmHg (ref 44.0–60.0)

## 2014-12-01 MED ORDER — ACETAMINOPHEN 325 MG PO TABS: 650.0000 mg | ORAL_TABLET | Freq: Four times a day (QID) | ORAL | Status: AC | PRN

## 2014-12-01 MED ORDER — HYDROCODONE-ACETAMINOPHEN 5-325 MG PO TABS: 1.0000 | ORAL_TABLET | ORAL | Status: AC | PRN

## 2014-12-01 MED ORDER — NYSTATIN 100000 UNIT/GM EX CREA: TOPICAL_CREAM | Freq: Two times a day (BID) | CUTANEOUS | Status: AC

## 2014-12-01 MED ORDER — VANCOMYCIN 50 MG/ML ORAL SOLUTION: 125.0000 mg | Freq: Four times a day (QID) | ORAL | Status: DC

## 2014-12-01 MED ORDER — INSULIN DETEMIR 100 UNIT/ML ~~LOC~~ SOLN: 4.0000 [IU] | Freq: Every day | SUBCUTANEOUS | Status: AC

## 2014-12-01 MED ORDER — INSULIN ASPART 100 UNIT/ML ~~LOC~~ SOLN: 0.0000 [IU] | Freq: Every day | SUBCUTANEOUS | Status: AC

## 2014-12-01 MED ORDER — GABAPENTIN 300 MG PO CAPS: 300.0000 mg | ORAL_CAPSULE | Freq: Every day | ORAL | Status: AC

## 2014-12-01 NOTE — Consult Note (Signed)
WOC wound follow up Wound type: Resolved blisters to left lower leg.  Incontinence Associated Skin Damage to sacrum, buttocks. This had been improving, but patient had an increase in diarrheal symptoms and skin is denuded and tender today.  Measurement: 4 cm x 4 cm erythema with a 0.5 cm nonintact lesion to left sacral area.   Wound WUJ:WJXBbed:pink and moist Drainage (amount, consistency, odor) Scant serous Periwound:Erythema, tender to touch Dressing procedure/placement/frequency:If diarrhea has lessened, begin Allevyn silicone foam to sacrum.  Change every 3 days and PRN soilage.  If diarrhea returns, discontinue use of foam dressing and begin moisture barrier cream twice daily and after incontinence.  No plastic/disposable underpads in bed. Use therapeutic linen (cloth pads) to wick moisture and prevent further breakdown/promote healing.  Will not follow at this time.  Please re-consult if needed.  Sara HudsonKaren Dontea Corlew RN BSN CWON Pager [phone]

## 2014-12-01 NOTE — Progress Notes (Signed)
Pt asking for a lawyer to help her with some documents. Discussed with patient that we have chaplains who can help with some documents including advance directives - pt stated that she would love to speak with one of them regarding such. Chaplains services notified.

## 2014-12-01 NOTE — Progress Notes (Addendum)
Palliative Medicine Inpatient Consult Follow Up Note   Name: [name] Date: 12/01/2014 MRN: [id number]  DOB: Feb 04, 1932  Referring Physician: Ramonita Lab, MD   Addendum:  PLEASE SEE PALLIATIVE CARE UPDATE AS THESE TENTATIVE PLANS HAVE CHANGED AND ARE EXPLAINED IN MY UPDATE NOTE.  1.45 pm  Palliative Care consult requested for this 81 y.o. female for goals of medical therapy in patient with cardiorenal syndrome. She has additional problems as outlined under IMPRESSION.  TODAY'S DISCUSSIONS AND DECISIONS:  I talked with pt about going back to [place] (granddaughter's wish).  She would like to return there.    I also spoke to pt about needing or benefiting from being under the care of Hospice in the near future. I gave a brief overview of the services.  She said that would be OK with her. She would like to talk with her granddaughter further, but she said it would be OK from her perspective.    PT has recommended Rehab as she participated more in therapy yesterday.  She will then likely go back as a skilled rehab pt (under [employer] program ).  She would  need a Palliative Care Consult while under Short Term Rehab days and then she could quickly transition to Hospice.  Granddaughter will need some time to get an attorney to be able to have Power of Attorney over the pt's financial resources in order to line up the private pay facility days.  So private pay long term days at [facility] cannot be arranged immediately --but probably by the time patient comes off of skilled days, it can be arranged.    Patient's diarrhea has stopped.  IV fluids are to stop today per nephro.  I spoke with Dr Amado Coe and she is clarifying her ability to discharge today and will talk with nephrology and also ID.    Pt is stable but not well.   Prognosis is very poor medically.  However, she has a fair to good prognosis for improving in terms of ADLs and strengthening, as she has  gotten quite deconditioned while here for so long--and she worked with PT yesterday.   Again, the goals are to maximize her ADLS with help of PT --and then transition to Hospice very soon.    IMPRESSION: Acute on Chronic Systolic CHF ---Due to Cardiomyopathy with echo on 11/14/14 showing EF of 20 - 25%  --also with diffuse hypkinesis and severe Wall Motion Abnormalities ---treated with dobutamine and dopamine infusions plus albumin and lasix in ICU with some stabilization and improvement (per Dr. Lewie Loron, cardiologist) Acute on chronic renal failure ---Due to Cardiorenal syndrome ---Also due to atheroscerosis and HTN ---With Anasarca  ---Creatinine improved slightly then worsened slightly.   ---Essentially, not much progress has been made and pt has reached a plateau due to cardiorenal syndrome ----Chronic renal failure stage IV ( with Estimated GFR 28 but now down to 22  with Cr at 2.03 which is down from higher values during this hospitalization but up from previous baseline Creatinine of 1.66.  --possibly at a new slightly worse baseline ) ---Urine output is poor ( intake 600 and output 575 --she did have two diarrheal stools yesterday that were not measured) ---Pt has a foley  Paroxysmal AFib on Eliquis CAD and hx of MI and CABG Nocturnal Hypoxemia  --on home O2 at night normally Tricuspid Moderate Regurgitation Mild to Moderate Pulmonary HTN with PA peak pressure of 46 mm Hg Mild to Moderate Pleural Effusion on Left  Hyperkalemia --resolved Elevated AST (liver function lab)--persistent  Anemia (multifactorial) with Hgb 8 -9 and MCV 80 ---due to chronic disease and renal failure ---iron panel on 10/18/14 was wnl   Recent ESBL UTI --treated with ABX ---has h/o recurrent UTIs  History of vaginal and urinary yeast infections  Recent HCAP Peripheral edema despite diuresis  DM2 Hyperlipidemia PVD with Bilateral SFA stenting in hx DJD History of Lymphoma 2010 treated by Dr  Sherrlyn Hock Hypothyroidism Paroxysmal Afib Decubiti (stage 2 sacrum) ---Measures 4 by 4 cm (erythema) with a ).5 cm nonintact lesion to left sacrum ---Allevyn changed q 3 days (if diarrhea returns, DC this and use barrier cream) ---No plastic or disposiable undrpads in bed.  Cloth pads only.  Left Lower extremity edema with dressings on blisters   ---apparently this has resolved    ---pt refuses daily dressing changes but permits Q 3 Day dressing changes  ---reassess need for any further dressings Weakness and lethargy ---was able to participate in PT yesterday Diarrhea for 5 days now stopped ---with CDiff AG pos but toxin neg ---Recommendations for ongoing empiric tx for possible C Diff as per Dr Amado Coe, attending. Pt has capacity to make her own healthcare decisions for now --this may not last long, however. ---However, she will not likely keep that ability as her condition will likely worsen and her mind will not remain so sharp (and she has been confused here off and on so it is time for pt to have a HCPOA) ---She defers most decisions to her grandaughter ---She has not yet completed the HCPOA form which needs to be done either here or at the SNF ---Pt agrees it is fine to transition to Hospice Care soon.     REVIEW OF SYSTEMS:  She says the diarrhea stopped last night. Only 2 loose BMs yesterday (unmeasured) Feels a little better Worked with PT yesterday   CODE STATUS: DNR   PAST MEDICAL HISTORY: Past Medical History  Diagnosis Date  . DM2 (diabetes mellitus, type 2)     onset age 8 insulin dependent  . Hyperlipidemia   . COPD (chronic obstructive pulmonary disease)   . Peripheral vascular disease   . CAD (coronary artery disease)     a. MI 1988 s/p CABG in 1988, multiple PCI on SVGs; b. cath 2012: occluded native coronary arteries, patent LIMA to LAD (distal LAD dz), patent SVG-OM & occluded SVG-RCA which filled via left to right collats; c. cath 12/2013 patent LIMA-LAD &  SVG-O. known occluded VG-RCA w/ L-R collats, no change since cath 2012  . Chronic systolic CHF (congestive heart failure)     a. 03/2012: EF 40-45%, mild lateral wall HK, mod inf HK, mod post wall HK, mild LVH, mild MR/TR   . Paroxysmal atrial fibrillation     a. CHADSVASc 7: yearly risk of CVA 9.6%;. b. on Eliquis 2.5 mg bid (age, SCr, borderline wt 62.9 Kg)   . HTN (hypertension)   . Yeast infection   . UTI (urinary tract infection)     Recurrent ESBL E.coli UTI  . Atherosclerosis of native artery of left lower extremity   . Myocardial infarction   . CKD (chronic kidney disease) stage 3, GFR 30-59 ml/min   . Hypoxia     on nocturnal o2    PAST SURGICAL HISTORY:  Past Surgical History  Procedure Laterality Date  . Abdominal hysterectomy    . Coronary artery bypass graft    . Appendectomy    . Cholecystectomy    .  Ovary surgery    . Angioplasty / stenting femoral      Bilateral SFA stenting  . Femur surgery    . Cardiac catheterization      [facility]  . Cardiac catheterization  12/2013  . Right hip replacement    . Total knee arthroplasty      right knee    Vital Signs: BP 119/55 mmHg  Pulse 63  Temp(Src) 97.7 F (36.5 C) (Oral)  Resp 16  Ht 5\' 1"  (1.549 m)  Wt 83.553 kg (184 lb 3.2 oz)  BMI 34.82 kg/m2  SpO2 98% Filed Weights   11/29/14 0853 11/30/14 0638 12/01/14 0430  Weight: 80.786 kg (178 lb 1.6 oz) 81.92 kg (180 lb 9.6 oz) 83.553 kg (184 lb 3.2 oz)    Estimated body mass index is 34.82 kg/(m^2) as calculated from the following:   Height as of this encounter: 5\' 1"  (1.549 m).   Weight as of this encounter: 83.553 kg (184 lb 3.2 oz).  PHYSICAL EXAM: Sleeping BUT when wakened was alert and knew me and situation EOMI OP clear  LABS: CBC:    Component Value Date/Time   WBC 5.3 11/30/2014 0406   WBC 4.4 07/10/2014 1558   WBC 6.5 06/17/2014 0412   HGB 9.0* 11/30/2014 0406   HGB 9.5* 06/17/2014 0412   HCT 29.9* 11/30/2014 0406   HCT 27.6* 07/10/2014 1558    HCT 28.6* 06/17/2014 0412   PLT 225 11/30/2014 0406   PLT 228 06/17/2014 0412   MCV 82.1 11/30/2014 0406   MCV 94 06/17/2014 0412   NEUTROABS 3.6 11/30/2014 0406   NEUTROABS 5.5 06/17/2014 0412   NEUTROABS 2.5 01/17/2014 1647   LYMPHSABS 0.8* 11/30/2014 0406   LYMPHSABS 0.6* 06/17/2014 0412   LYMPHSABS 2.0 01/17/2014 1647   MONOABS 0.7 11/30/2014 0406   MONOABS 0.4 06/17/2014 0412   EOSABS 0.1 11/30/2014 0406   EOSABS 0.0 06/17/2014 0412   EOSABS 0.4 01/17/2014 1647   BASOSABS 0.0 11/30/2014 0406   BASOSABS 0.0 06/17/2014 0412   BASOSABS 0.0 01/17/2014 1647   Comprehensive Metabolic Panel:    Component Value Date/Time   NA 135 12/01/2014 0857   NA 141 08/03/2014 1413   NA 140 06/16/2014 0215   K 4.6 12/01/2014 0857   K 4.3 06/16/2014 0215   CL 95* 12/01/2014 0857   CL 104 06/16/2014 0215   CO2 34* 12/01/2014 0857   CO2 25 06/16/2014 0215   BUN 68* 12/01/2014 0857   BUN 36* 08/03/2014 1413   BUN 40* 06/16/2014 0215   CREATININE 2.03* 12/01/2014 0857   CREATININE 1.42* 06/16/2014 0215   GLUCOSE 154* 12/01/2014 0857   GLUCOSE 155* 08/03/2014 1413   GLUCOSE 278* 06/16/2014 0215   CALCIUM 7.9* 12/01/2014 0857   CALCIUM 8.5* 06/16/2014 0215   AST 61* 11/19/2014 0448   AST 28 06/14/2014 1950   ALT 20 11/19/2014 0448   ALT 13* 06/14/2014 1950   ALKPHOS 80 11/19/2014 0448   ALKPHOS 62 06/14/2014 1950   BILITOT 0.9 11/19/2014 0448   BILITOT 0.3 06/14/2014 1950   PROT 5.7* 11/19/2014 0448   PROT 7.3 06/14/2014 1950   ALBUMIN 3.5 11/24/2014 0543   ALBUMIN 3.8 06/14/2014 1950    More than 50% of the visit was spent in counseling/coordination of care: YES  Time Spent:  45 min

## 2014-12-01 NOTE — Progress Notes (Signed)
Patient Name: [name] Date of Encounter: 12/01/2014     Principal Problem:   Acute on chronic combined systolic and diastolic congestive heart failure (HCC) Active Problems:   Renal failure (ARF), acute on chronic (HCC)   Cardiomyopathy, ischemic   Paroxysmal atrial fibrillation (HCC)   Essential hypertension   DM2 (diabetes mellitus, type 2) (HCC)   Coronary artery disease involving coronary bypass graft of native heart without angina pectoris   Bilateral leg edema   Hyperlipidemia   COPD (chronic obstructive pulmonary disease) (HCC)   Elevated troponin   Protein-calorie malnutrition, severe (HCC)   Hypoxia    SUBJECTIVE  C/O general malaise.  Diarrhea improved - no loose stools over past 24 hrs.  PO intake picking up some - had apple sauce, pudding, and a few breakfast potatoes this AM.  No dyspnea at rest.  Creat sl better but weight climbing.  CURRENT MEDS . acidophilus  1 capsule Oral BID  . amiodarone  200 mg Oral Daily  . antiseptic oral rinse  7 mL Mouth Rinse BID  . apixaban  2.5 mg Oral BID  . atorvastatin  40 mg Oral q1800  . gabapentin  300 mg Oral QHS  . insulin aspart  0-5 Units Subcutaneous QHS  . insulin aspart  0-9 Units Subcutaneous TID WC  . insulin detemir  4 Units Subcutaneous QHS  . levothyroxine  100 mcg Oral QAC breakfast  . nystatin cream   Topical BID  . sodium chloride  3 mL Intravenous Q12H  . vancomycin  125 mg Oral QID    OBJECTIVE  Filed Vitals:   11/30/14 0642 11/30/14 1014 11/30/14 2022 12/01/14 0430  BP: 123/59 125/60 105/54 119/55  Pulse: 57 57 63 63  Temp: 97.4 F (36.3 C) 97.2 F (36.2 C) 97.6 F (36.4 C) 97.7 F (36.5 C)  TempSrc: Oral   Oral  Resp: Height:      Weight:    184 lb 3.2 oz (83.553 kg)  SpO2: 100% 100% 97% 98%    Intake/Output Summary (Last 24 hours) at 12/01/14 1023 Last data filed at 12/01/14 0841  Gross per 24 hour  Intake    600 ml  Output    575 ml  Net     25 ml    Filed Weights   11/29/14 0853 11/30/14 0638 12/01/14 0430  Weight: 178 lb 1.6 oz (80.786 kg) 180 lb 9.6 oz (81.92 kg) 184 lb 3.2 oz (83.553 kg)    PHYSICAL EXAM  General: Pleasant, NAD. Neuro: Alert and oriented X 3. Moves all extremities spontaneously. Psych: Normal affect. HEENT: Normal Neck: Supple without bruits. JVD to jaw. Lungs: Resp regular and unlabored, crackles bilat bases.  Heart: RRR no s3, s4, or murmurs. Somewhat distant @ llsb/apex. Abdomen: Firm with flank and lower abdominal edema. Non-tender. BS + x 4.  Extremities: No clubbing, cyanosis. 2+ bilat LE edema to mid-ankle - puffy. DP/PT/Radials 1+ and equal bilaterally.  Accessory Clinical Findings  CBC  Recent Labs  11/30/14 0406  WBC 5.3  NEUTROABS 3.6  HGB 9.0*  HCT 29.9*  MCV 82.1  PLT 225   Basic Metabolic Panel  Recent Labs  11/30/14 0406 12/01/14 0857  NA 134* 135  K 4.4 4.6  CL 91* 95*  CO2 33* 34*  GLUCOSE 198* 154*  BUN 71* 68*  CREATININE 2.39* 2.03*  CALCIUM 8.0* 7.9*   TELE  rsr  ASSESSMENT AND PLAN  81 y.o. female with  h/o CAD s/p CABG s/p PCI on SVGs, ischemic cardiomyopathy/chronic systolic CHF, PAF on Eliquis, PVD, COPD requiring nocturnal oxygen, CKD stage III, IDDM, HLD, and HTN who has been admitted [facility] to [facility] in mid June and mid July for ESBL UTI, HCAP and acute on chronic systolic CHF; mid August for ileus; late August for HCAP again; who [facility] with acute on chronic CKD. Cardiology was consulted on 10/4 with acute on chronic systolic CHF in the setting of newly depressed EF of 20-25% by echo on 11/14/2014.  1. Acute on chronic systolic CHF/ICM: Ejection fraction 20-25% by echo 9/27. Previously treated with pressors/inotropes with improvement of her renal function and with diuresis however, following several days of diarrhea and anorexia, her creatinine has been climbing and is now 2.35. Net negative 1.45L for  this admission. Wts have been variably reported with a max reported wt of 189 lbs on 9/30.  Since being off of diuretics/inotropes, wt has been up - now 184 lbs per bed scale. She has marked anasarca with dependent edema of her flanks and lower abdomen. Echocardiogram approximately 2 weeks ago with only mildly elevated right heart pressures. -She does have a PICC line.  I ordered a mixed venous blood gas/co-ox the other day but an arterial co-ox was drawn instead.  Would be nice to know what her mixed venous saturation is, as it can serve as a surrogate for CO and guide future decisions regarding inotropes.  --Not a good candidate for bridging therapy to more advanced cardiac support such as LVAD. --Palliative care following.  Difficult situation.  2. Acute on CKD stage IV/cardiorenal syndrome: Creat improved this AM following 2 days of hydration. -Plan for IVF off today per nephrology. -Diuretics on hold.  3. PAF: Currently in sinus rhythm -Cont carvedilol, amiodarone, Eliquis 2.5 mg bid (adjusted 2/2 age/creat). -CHADSVASc at least 7 (CHF, HTN, DM, vascular disease, age x 2, female)  4. CAD s/p CABG and PCI as above: No angina. Trop 0.05 on admission. Severe native disease on cardiac catheterization in 05/2014 with patent LIMA  LAD and VG  OM - stable since 12/2013 cath. New LV dysfxn raises concern for occlusion of a graft however low level troponin argues against this. Given multiple comorbidities, she is not a good candidate for further ischemic workup at this time, especially in light of acute on chronic renal failure. -On Eliquis in place of aspirin  -Cont bb/statin.   5. DM2: -SSI per IM  Signed, Nicolasa Duckinghristopher Berge NP

## 2014-12-01 NOTE — Care Management (Signed)
Spoke with attending regarding the verbal reports from healthcare team that patient is "not ready for hospice.'  Discussed that palliative care with input from the patient and granddaughter are looking to return to skilled nursing and continue rehab and have palliative care follow at the facility- not hospice initially.  Attending will speak with patient and Victorino DikeJennifer- the granddaughter.

## 2014-12-01 NOTE — Progress Notes (Addendum)
[facility] [place] [facility]   PATIENT NAME: [name]    MR#:  [id number]  DATE OF BIRTH:  [birth year]  SUBJECTIVE:    Not feeling well, not considering HD.  Poor UOP. Oliguric range Diarrhea improved  REVIEW OF SYSTEMS:    Review of Systems  Constitutional: Positive for malaise/fatigue. Negative for fever and chills.  HENT: Negative for sore throat.   Eyes: Negative for blurred vision.  Respiratory: Positive for shortness of breath. Negative for cough, hemoptysis and wheezing.   Cardiovascular: Negative for chest pain, palpitations and leg swelling (Anasarca).  Gastrointestinal: Negative for nausea, vomiting, abdominal pain, diarrhea and blood in stool.  Genitourinary: Negative for dysuria.  Musculoskeletal: Negative for back pain.  Neurological: Negative for dizziness, tremors and headaches.  Endo/Heme/Allergies: Does not bruise/bleed easily.    Tolerating Diet: Yes DRUG ALLERGIES:   Allergies  Allergen Reactions  . Contrast Media [Iodinated Diagnostic Agents] Shortness Of Breath  . Tramadol Nausea Only  . Valium [Diazepam] Other (See Comments)    Reaction:  Elevated heart rate  . Iodine Strong [Iodine] Rash  . Penicillins Rash   VITALS:  Blood pressure 115/44, pulse 65, temperature 98.4 F (36.9 C), temperature source Oral, resp. rate 18, height  (1.549 m), weight 83.553 kg (184 lb 3.2 oz), SpO2 100 %. PHYSICAL EXAMINATION:   Physical Exam   GENERAL:  81 y.o.-year-old patient lying in the bed with no acute distress.  EYES: Pupils equal, round, reactive to light and accommodation. No scleral icterus. Extraocular muscles intact.  HEENT: Head atraumatic, normocephalic. Oropharynx and nasopharynx clear.  NECK:  Supple, no jugular venous distention. No thyroid enlargement, no tenderness.  LUNGS: Normal work of breathing. Poor air entry bilaterally. Crackles. CARDIOVASCULAR: S1, S2 normal. No murmurs, rubs, or gallops.   ABDOMEN: Soft, nontender, nondistended. Bowel sounds present. No organomegaly or mass.  EXTREMITIES: No cyanosis, clubbing.    NEUROLOGIC: Cranial nerves II through XII are intact. No focal Motor or sensory deficits b/l.   PSYCHIATRIC: The patient is alert and oriented x 3.  SKIN: No obvious rash, lesion, or ulcer.  Generalized anasarca.  LABORATORY PANEL:   CBC  Recent Labs Lab 11/30/14 0406  WBC 5.3  HGB 9.0*  HCT 29.9*  PLT 225   ------------------------------------------------------------------------------------------------------------------  Chemistries   Recent Labs Lab 12/01/14 0857  NA 135  K 4.6  CL 95*  CO2 34*  GLUCOSE 154*  BUN 68*  CREATININE 2.03*  CALCIUM 7.9*    ASSESSMENT AND PLAN:   81 year old female with a history of chronic systolic heart failure, DJD with functional quadriplegia, type 2 diabetes and hypertension who presented with elevation in her creatinine and edema.  * Diarrhea 2/2 laxatives D/ced laxatives, improved diarrhea C diff antigen is positive but toxin is negative. PCR neg Afebrile. Normal WBC. d/c po vanc and Flagyl as PCR neg, d/w ID  * Acute on chronic systolic heart failure with anasarca: 2/2 CARDIORENAL SYN  Her echocardiogram showed EF of 20-25% with hypokinesis of several areas.   Apreciate cardio input. Pt would like to talk to her cardiology dr.Arida regarding HD, will notify cardio  Monitor input and output. Diuretics d/ced as  her Cr is worse   * Chronic respiratory failure is stable  * Acute renal failure on CKD 4 with oliguria Diuretics d/ced as Cr is worsening D/ced ivf as well as no significant improvement Pt would like to talk to her cardiology dr.Arida regarding HD, will notify cardio   *  ESBL urinary tract infection Was on Invanz Abx finished for 10 days and stopped  * Anemia of chronic disease Stable No need for transfusion  * CAD status post CABG: Patient is not on ACE inhibitor/ARB due to  hypotension and acute renal failure.   * Atrial fibrillation: heart rate is controlled. Patient will continue on Eliquis and amiodarone.  * Diabetes type 2: Continue Lantus and sliding scale insulin.  * Hypothyroidism   On Levothyroxin  [facility], plan is to d/c to SNF for rehab and to transit to hospice care if pt and GD areagreeable Appreciate Palliative care recommendations  Will d/w pts GD- ms .[name] in am, she is not avaiable today per pt  Poor prognosis due to multiple comorbidities including ejection fraction of only 20%. Now has acute renal failure. Likely cardiorenal syndrome. Patient's CODE STATUS has been changed to DO NOT RESUSCITATE by palliative care  She will likely need hospice services at discharge. CODE STATUS: DNR  TOTAL TIME TAKING CARE OF THIS PATIENT: 35 minutes.    Ramonita LabGouru, Vasiliki Smaldone M.D on 12/01/2014 at 8:00 PM  Between 7am to 6pm - Pager - [phone] After 6pm go to [email or web address] - password [email or web address] [facility]  [facility]  Office  [phone]  CC: Primary care physician; Fidel LevyJames Hawkins Jr, MD

## 2014-12-01 NOTE — Progress Notes (Signed)
Subjective:  Pt resting in bed. Cr has come down to 2.03. BUN currently 68 which is also down.  Objective:  Vital signs in last 24 hours:  Temp:  [97.6 F (36.4 C)-97.7 F (36.5 C)] 97.7 F (36.5 C) (10/14 0430) Pulse Rate:  [63-65] 65 (10/14 1055) Resp:  [16] 16 (10/14 0430) BP: (105-119)/(45-55) 105/45 mmHg (10/14 1055) SpO2:  [97 %-98 %] 98 % (10/14 0430) Weight:  [83.553 kg (184 lb 3.2 oz)] 83.553 kg (184 lb 3.2 oz) (10/14 0430)  Weight change: 2.767 kg (6 lb 1.6 oz) Filed Weights   11/29/14 0853 11/30/14 [id number] 12/01/14 0430  Weight: 80.786 kg (178 lb 1.6 oz) 81.92 kg (180 lb 9.6 oz) 83.553 kg (184 lb 3.2 oz)    Intake/Output:    Intake/Output Summary (Last 24 hours) at 12/01/14 1136 Last data filed at 12/01/14 0841  Gross per 24 hour  Intake    600 ml  Output    575 ml  Net     25 ml     Physical Exam: General:  elderly, frail, NAD  HEENT  moist mucous membranes, anicteric sclera   Neck  supple   Pulm/lungs  mild basilar crackles, normal effort  CVS/Heart  S1S2 no rubs  Abdomen:   soft, nontender, nondistended   Extremities: 1+ dependent edema   Neurologic:  alert, oriented, speech normal   Skin:  no acute rashes, skin breakdown over shins      GU  Foley in place     Basic Metabolic Panel:   Recent Labs Lab 11/27/14 0600 11/28/14 0525 11/29/14 0921 11/30/14 0406 12/01/14 0857  NA 135 135 133* 134* 135  K 4.5 4.8 4.7 4.4 4.6  CL 94* 90* 92* 91* 95*  CO2 36* 34* 35* 33* 34*  GLUCOSE 167* 145* 195* 198* 154*  BUN 67* 70* 71* 71* 68*  CREATININE 2.05* 2.12* 2.35* 2.39* 2.03*  CALCIUM 8.0* 8.2* 8.1* 8.0* 7.9*     CBC:  Recent Labs Lab 11/25/14 0438 11/28/14 0525 11/30/14 0406  WBC 5.3 4.7 5.3  NEUTROABS  --   --  3.6  HGB 8.5* 9.0* 9.0*  HCT 27.5* 29.7* 29.9*  MCV 80.9 81.2 82.1  PLT 218 238 225      Microbiology:  Recent Results (from the past 720 hour(s))  Blood Culture (routine x 2)     Status: None   Collection Time:  11/09/14  2:34 PM  Result Value Ref Range Status   Specimen Description BLOOD RIGHT ARM  Final   Special Requests BOTTLES DRAWN AEROBIC AND ANAEROBIC 3CC  Final   Culture NO GROWTH 5 DAYS  Final   Report Status 11/14/2014 FINAL  Final  Urine culture     Status: None   Collection Time: 11/09/14  2:34 PM  Result Value Ref Range Status   Specimen Description URINE, CLEAN CATCH  Final   Special Requests NONE  Final   Culture   Final    >=100,000 COLONIES/mL ESCHERICHIA COLI ESBL-EXTENDED SPECTRUM BETA LACTAMASE-THE ORGANISM IS RESISTANT TO PENICILLINS, CEPHALOSPORINS AND AZTREONAM ACCORDING TO CLSI M100-S15 VOL.25 N01 JAN 2005. CRITICAL RESULT CALLED TO, READ BACK BY AND VERIFIED WITH: DANIEL JACOBS,RN 11/11/2014 0909 JRS.    Report Status 11/11/2014 FINAL  Final   Organism ID, Bacteria ESCHERICHIA COLI  Final      Susceptibility   Escherichia coli - MIC*    AMPICILLIN >=32 RESISTANT Resistant     CEFAZOLIN >=64 RESISTANT Resistant     CEFTRIAXONE >=64  RESISTANT Resistant     CIPROFLOXACIN >=4 RESISTANT Resistant     GENTAMICIN <=1 SENSITIVE Sensitive     IMIPENEM <=0.25 SENSITIVE Sensitive     NITROFURANTOIN <=16 SENSITIVE Sensitive     TRIMETH/SULFA >=320 RESISTANT Resistant     Extended ESBL POSITIVE Resistant     PIP/TAZO Value in next row Sensitive      SENSITIVE8    LEVOFLOXACIN Value in next row Resistant      RESISTANT>=8    * >=100,000 COLONIES/mL ESCHERICHIA COLI  MRSA PCR Screening     Status: None   Collection Time: 11/09/14  6:24 PM  Result Value Ref Range Status   MRSA by PCR NEGATIVE NEGATIVE Final    Comment:        The GeneXpert MRSA Assay (FDA approved for NASAL specimens only), is one component of a comprehensive MRSA colonization surveillance program. It is not intended to diagnose MRSA infection nor to guide or monitor treatment for MRSA infections.   MRSA PCR Screening     Status: None   Collection Time: 11/21/14  2:04 PM  Result Value Ref Range  Status   MRSA by PCR NEGATIVE NEGATIVE Final    Comment:        The GeneXpert MRSA Assay (FDA approved for NASAL specimens only), is one component of a comprehensive MRSA colonization surveillance program. It is not intended to diagnose MRSA infection nor to guide or monitor treatment for MRSA infections.   C difficile quick scan w PCR reflex     Status: Abnormal   Collection Time: 11/24/14 11:01 PM  Result Value Ref Range Status   C Diff antigen POSITIVE (A) NEGATIVE Final   C Diff toxin NEGATIVE NEGATIVE Final   C Diff interpretation   Final    Negative for toxigenic C. difficile. Toxin gene and active toxin production not detected. May be a nontoxigenic strain of C. difficile bacteria present, lacking the ability to produce toxin.  Clostridium Difficile by PCR     Status: None   Collection Time: 11/24/14 11:01 PM  Result Value Ref Range Status   Toxigenic C Difficile by pcr NEGATIVE NEGATIVE Final  Stool culture     Status: None   Collection Time: 11/27/14  9:00 PM  Result Value Ref Range Status   Specimen Description STOOL  Final   Special Requests NONE  Final   Culture   Final    NO SALMONELLA OR SHIGELLA ISOLATED No Pathogenic E. coli detected NO CAMPYLOBACTER DETECTED    Report Status 11/30/2014 FINAL  Final    Coagulation Studies: No results for input(s): LABPROT, INR in the last 72 hours.  Urinalysis: No results for input(s): COLORURINE, LABSPEC, PHURINE, GLUCOSEU, HGBUR, BILIRUBINUR, KETONESUR, PROTEINUR, UROBILINOGEN, NITRITE, LEUKOCYTESUR in the last 72 hours.  Invalid input(s): APPERANCEUR    Imaging: No results found.   Medications:   . sodium chloride 50 mL/hr at 11/30/14 1021   . acidophilus  1 capsule Oral BID  . amiodarone  200 mg Oral Daily  . antiseptic oral rinse  7 mL Mouth Rinse BID  . apixaban  2.5 mg Oral BID  . atorvastatin  40 mg Oral q1800  . gabapentin  300 mg Oral QHS  . insulin aspart  0-5 Units Subcutaneous QHS  . insulin  aspart  0-9 Units Subcutaneous TID WC  . insulin detemir  4 Units Subcutaneous QHS  . levothyroxine  100 mcg Oral QAC breakfast  . nystatin cream   Topical BID  .  sodium chloride  3 mL Intravenous Q12H  . vancomycin  125 mg Oral QID   acetaminophen **OR** acetaminophen, albuterol, HYDROcodone-acetaminophen, loperamide, nitroGLYCERIN, ondansetron **OR** ondansetron (ZOFRAN) IV, polyethylene glycol, promethazine, simethicone, zolpidem  Assessment/ Plan:  81 y.o. female with a PMHX of chronic systolic congestive heart failure, type 2 diabetes, hypertension, atrial fibrillation, DJD, COPD, coronary disease/CABG 1988, catheter 2015, recurrent UTIs, chronic kidney disease, who was admitted to [facility] on 11/09/2014 for evaluation of elevated creatinine above her baseline and increased swelling.   1. Acute renal failure on chronic kidney disease stage 4 . Patient's baseline creatinine is 1.66/GFR of 28. CKD is likely secondary to atherosclerosis and hypertension.  Acute renal failure from acute cardiorenal syndrome. Had with dobutamine, dopamine, albumin and furosemide previously - Cr is coming down to 2.03, but UOP quite poor.  If oliguria persists may need to consider temporary dialysis, but will hold off for now.  She may end up developing a new baseline Cr.   2. Hypertension and Anasarca/edema. From systolic congestive heart failure acute exacerbation. - diuretics held earlier due to worsening renal function.  BUN/Cr both declining now, but pt at risk for worsening heart failure.  Oliguria has developed, as above may need to consider HD if this persists.  LOS: 22 Nivia Gervase 10/14/201611:36 AM

## 2014-12-01 NOTE — Progress Notes (Signed)
Palliative Care Update  The tentative  plan to have [name] return [facility] either as a Hospice [name] or as a skilled short term rehab [name] have now changed. Dr Amado CoeGouru spoke with Dr. Cherylann RatelLateef prior to discharging [name] .  He feels [name] can be discharged today or soon only IF she is definitely an active Hospice [name]--- but since she is not clearly a Hospice [name] right now --- then she should stay due to concerns about urine output and the likelihood of readmission.   [name] had agreed to considering Hospice-- but said she would talk about this with Victorino DikeJennifer, her granddaughter.  Granddaughter agreed [name] should be under Hospice care when this can be worked out. But, there were reasons why Hospice was not to be initiated right away:         1.  [name] had recommended SNF/ short term therapy, so that led to the tentative plan of [name] going [facility] as a short term rehab [name] --with Palliative Care, so she could then transition to Hospice very soon.          2.  Patient cannot  go to facility as a Hospice [name] yet under private pay for room and board  (in order to get Hospice care) mainly b/c granddaughter does not yet have POA over [name]'s finances.  Granddaughter wants [name] to [facility] instead of [name] going home or any other location.  She can only return right now as a skilled [name] with Palliative Care but going there as a Hospice [name] would involve private pay and that has to be worked out by Surveyor, quantitygranddaughter.  This is a Psychologist, clinicalmatter I will defer to Child psychotherapistocial Worker as perhaps this could be worked out sooner than granddaughter feels it can be arranged.     For now, it appears that there are no further discussions needed as the [name] and granddaughter favor Hospice care ---its just not quite practical as yet.  [name] agrees with Hospice but she also had stated she would agree with dialysis.  Her wishes are not entirely consistent (they never have been).     I will sign off at this time.  Please call again, if I can help.  I think that she  will likely improve enough go to [facility]  as a rehab [name] with a palliative consult -or she will worsen enough to go to [facility] as a Hospice [name] (with financial concerns addressed once granddaughter gets some control over pts finances by becoming POA etc).  I have placed a call to granddaughter and left a message for her to call me back.    Suan HalterMargaret F Azure Barrales, MD

## 2014-12-01 NOTE — Progress Notes (Signed)
Dr. Amado CoeGouru aware of venous blood gas results. No intervention at this time. Reinforced plan of trying to improve renal function vs. Hospice.

## 2014-12-01 NOTE — Progress Notes (Signed)
Per Dr. Cherylann RatelLateef, foley should remain at this time to monitor kidney function.

## 2014-12-01 NOTE — Progress Notes (Signed)
Lower extremity dressings to be changed every 3 days or PRN. Patient refuses to have them changed today.

## 2014-12-01 NOTE — Progress Notes (Signed)
Due to patient's decline in status and possible hospice involvement, her initial appointment at [facility] has been cancelled. Will follow along and should her health status improve, will reschedule the appointment at that time. Thank you.

## 2014-12-01 NOTE — Progress Notes (Signed)
Clinical Social worker informed by Dr. Orvan Falconerampbell possible discharge for patient today. [facility], patient has only used 9 of her SNF days.  [facility] able to obtain auth from insurance today.   Clinical Social worker sending requested information to SNF in order to start auth in anticipation of discharge today or tomorrow.  Facility is able to take patient if medically clear today.   Sammuel Hineseborah Josel Keo. Theresia MajorsLCSWA, MSW Clinical Social Work Department [phone] 11:54 AM

## 2014-12-02 LAB — CBC
HEMATOCRIT: 26.8 % — AB (ref 35.0–47.0)
HEMOGLOBIN: 8.3 g/dL — AB (ref 12.0–16.0)
MCH: 25.4 pg — AB (ref 26.0–34.0)
MCHC: 30.9 g/dL — ABNORMAL LOW (ref 32.0–36.0)
MCV: 82.1 fL (ref 80.0–100.0)
Platelets: 208 10*3/uL (ref 150–440)
RBC: 3.26 MIL/uL — AB (ref 3.80–5.20)
RDW: 27.3 % — ABNORMAL HIGH (ref 11.5–14.5)
WBC: 4.7 10*3/uL (ref 3.6–11.0)

## 2014-12-02 LAB — GLUCOSE, CAPILLARY
GLUCOSE-CAPILLARY: 176 mg/dL — AB (ref 65–99)
GLUCOSE-CAPILLARY: 202 mg/dL — AB (ref 65–99)
Glucose-Capillary: 145 mg/dL — ABNORMAL HIGH (ref 65–99)
Glucose-Capillary: 138 mg/dL — ABNORMAL HIGH (ref 65–99)

## 2014-12-02 LAB — BASIC METABOLIC PANEL
ANION GAP: 7 (ref 5–15)
BUN: 64 mg/dL — AB (ref 6–20)
CHLORIDE: 95 mmol/L — AB (ref 101–111)
CO2: 33 mmol/L — AB (ref 22–32)
Calcium: 7.8 mg/dL — ABNORMAL LOW (ref 8.9–10.3)
Creatinine, Ser: 1.92 mg/dL — ABNORMAL HIGH (ref 0.44–1.00)
GFR calc Af Amer: 27 mL/min — ABNORMAL LOW (ref >60)
GFR, EST NON AFRICAN AMERICAN: 23 mL/min — AB (ref >60)
GLUCOSE: 138 mg/dL — AB (ref 65–99)
POTASSIUM: 4 mmol/L (ref 3.5–5.1)
Sodium: 135 mmol/L (ref 135–145)

## 2014-12-02 MED ORDER — SODIUM CHLORIDE 0.9 % IJ SOLN
10.0000 mL | Freq: Two times a day (BID) | INTRAMUSCULAR | Status: DC
  Administered 2014-12-02 – 2014-12-04 (×5): 10 mL via INTRAVENOUS

## 2014-12-02 NOTE — Progress Notes (Signed)
   12/02/14 1300  Clinical Encounter Type  Visited With Patient and family together  Visit Type Follow-up  Referral From Nurse  Consult/Referral To Chaplain  Spiritual Encounters  Spiritual Needs Literature;Prayer  Stress Factors  Patient Stress Factors Exhausted;Family relationships;Health changes  Advance Directives (For Healthcare)  Does patient have an advance directive? Yes  Would patient like information on creating an advanced directive? Yes - Scientist, clinical (histocompatibility and immunogenetics) given  Type of Paramedic of [place];Living will  Does patient want to make changes to advanced directive? Yes - information given  Copy of advanced directive(s) in chart? Yes  Met w/patient and family. Completed HPOA & Living Will. Provided copy to medical staff to scan into pt chart. Provided original notarized AD & copy to patient. Provided prayer. Chap. Jovann Luse G. [place]

## 2014-12-02 NOTE — Progress Notes (Addendum)
Dressings changed to patients bilat. Lower extremities. RN attempted to place prevalon boots, but patient refused. RN at bedside helping patient eat, but patient refuses to eat food from dining. Patient reports family left food for her in the room, but there is none present in the room. Patient continues to refuse to eat hospital food after much encouragement from RN.

## 2014-12-02 NOTE — Progress Notes (Signed)
SUBJECTIVE: No chest pain. Mild SOB.   BP 125/55 mmHg  Pulse 63  Temp(Src) 97.8 F (36.6 C) (Oral)  Resp 14  Ht 5\' 1"  (1.549 m)  Wt 184 lb 7 oz (83.66 kg)  BMI 34.87 kg/m2  SpO2 99%  Intake/Output Summary (Last 24 hours) at 12/02/14 1139 Last data filed at 12/02/14 0900  Gross per 24 hour  Intake 1195.83 ml  Output    650 ml  Net 545.83 ml    PHYSICAL EXAM General: Well developed, well nourished, in no acute distress. Alert and oriented x 3.  Psych:  Good affect, responds appropriately Neck: + JVD. No masses noted.  Lungs: Clear bilaterally with no wheezes or rhonci noted.  Heart: RRR with no murmurs noted. Abdomen: Bowel sounds are present. Soft, non-tender.  Extremities: 2+ bilateral lower extremity edema.   LABS: Basic Metabolic Panel:  Recent Labs  91/47/8210/14/16 0857 12/02/14 0445  NA 135 135  K 4.6 4.0  CL 95* 95*  CO2 34* 33*  GLUCOSE 154* 138*  BUN 68* 64*  CREATININE 2.03* 1.92*  CALCIUM 7.9* 7.8*   CBC:  Recent Labs  11/30/14 0406 12/02/14 0445  WBC 5.3 4.7  NEUTROABS 3.6  --   HGB 9.0* 8.3*  HCT 29.9* 26.8*  MCV 82.1 82.1  PLT 225 208   Current Meds: . acidophilus  1 capsule Oral BID  . amiodarone  200 mg Oral Daily  . antiseptic oral rinse  7 mL Mouth Rinse BID  . apixaban  2.5 mg Oral BID  . atorvastatin  40 mg Oral q1800  . gabapentin  300 mg Oral QHS  . insulin aspart  0-5 Units Subcutaneous QHS  . insulin aspart  0-9 Units Subcutaneous TID WC  . insulin detemir  4 Units Subcutaneous QHS  . levothyroxine  100 mcg Oral QAC breakfast  . nystatin cream   Topical BID  . sodium chloride  10 mL Intravenous Q12H  . sodium chloride  3 mL Intravenous Q12H    ASSESSMENT AND PLAN: 81 y.o. female with h/o CAD s/p CABG s/p PCI on SVGs, ischemic cardiomyopathy/chronic systolic CHF, PAF on Eliquis, PVD, COPD requiring nocturnal oxygen, CKD stage III, IDDM, HLD, and HTN who has been admitted recently to [facility] in mid June and mid July for ESBL  UTI, HCAP and acute on chronic systolic CHF; mid August for ileus; late August for HCAP again; who [facility] with acute on chronic CKD. Cardiology was consulted on 10/4 with acute on chronic systolic CHF in the setting of newly depressed EF of 20-25% by echo on 11/14/2014.  1. Acute on chronic systolic CHF/ICM: Ejection fraction 20-25% by echo 9/27. Previously treated with pressors/inotropes with improvement of her renal function and with diuresis however, following several days of diarrhea and anorexia, her creatinine worsened. She has marked anasarca with dependent edema of her flanks and lower abdomen. Echocardiogram approximately 2 weeks ago with only mildly elevated right heart pressures. Not a good candidate for bridging therapy to more advanced cardiac support such as LVAD. Palliative care following.Nephrology following. Given continued volume excess with inability to diurese effectively with diuretics without renal failure, we are at a point where we will have to decide between HD vs palliation. I have discussed this with her today. I do not think I would move her back to the ICU for inotropic support. I would favor palliative care and move toward Hospice. She will discuss with Nephrology  and her family.   2. Acute on CKD stage IV/cardiorenal syndrome: Creatine is stable. Nephrology following. Diuretics on hold.  3. PAF: Currently in sinus rhythm. Cont carvedilol, amiodarone, Eliquis 2.5 mg bid (adjusted 2/2 age/creat). CHADSVASc at least 7 (CHF, HTN, DM, vascular disease, age x 2, female)  4. CAD s/p CABG and PCI: No angina. Trop 0.05 on admission. Severe native disease on cardiac catheterization in 05/2014 with patent LIMA  LAD and VG  OM - stable since 12/2013 cath. Given multiple comorbidities, she is not a good candidate for further ischemic workup at this time, especially in light of acute on chronic renal failure. On Eliquis in place of aspirin. Cont beta  blocker/statin.   5. DM2: SSI per IM     [name]  10/15/201611:39 AM

## 2014-12-02 NOTE — Care Management Important Message (Signed)
Important Message  Patient Details  Name: [name] MRN: [id number] Date of Birth: 1932/01/21   Medicare Important Message Given:  Yes-fourth notification given on 10/14- appears I forgot to document    Eber HongGreene, Desiray Orchard R, RN 12/02/2014, 3:34 PM

## 2014-12-02 NOTE — Progress Notes (Addendum)
On rounds this AM, MD asked RN to page her when family at bedside. Granddaughter, Victorino DikeJennifer at bedside right now and reports she will be leaving soon. Dr. Amado CoeGouru paged x2 awaiting call back.  Update: 2:47 PM Gouru updated and aware.

## 2014-12-02 NOTE — Progress Notes (Signed)
[facility] - [facility] at [facility]   PATIENT NAME: [name]    MR#:  [id number]  DATE OF BIRTH:  [birth year]  SUBJECTIVE:    Not feeling well, pt has talked to cardiology today and not considering HD for now ,  felt better as her renal fx is better Diarrhea improved  REVIEW OF SYSTEMS:    Review of Systems  Constitutional: Positive for malaise/fatigue. Negative for fever and chills.  HENT: Negative for sore throat.   Eyes: Negative for blurred vision.  Respiratory: Negative for cough, hemoptysis, shortness of breath and wheezing.   Cardiovascular: Negative for chest pain, palpitations and leg swelling (Anasarca).  Gastrointestinal: Negative for nausea, vomiting, abdominal pain, diarrhea and blood in stool.  Genitourinary: Negative for dysuria.  Musculoskeletal: Negative for back pain.  Neurological: Negative for dizziness, tremors and headaches.  Endo/Heme/Allergies: Does not bruise/bleed easily.    Tolerating Diet: Yes DRUG ALLERGIES:   Allergies  Allergen Reactions  . Contrast Media [Iodinated Diagnostic Agents] Shortness Of Breath  . Tramadol Nausea Only  . Valium [Diazepam] Other (See Comments)    Reaction:  Elevated heart rate  . Iodine Strong [Iodine] Rash  . Penicillins Rash   VITALS:  Blood pressure 125/55, pulse 63, temperature 97.8 F (36.6 C), temperature source Oral, resp. rate 14, height 5\' 1"  (1.549 m), weight 83.66 kg (184 lb 7 oz), SpO2 99 %. PHYSICAL EXAMINATION:   Physical Exam   GENERAL:  81 y.o.-year-old patient lying in the bed with no acute distress.  EYES: Pupils equal, round, reactive to light and accommodation. No scleral icterus. Extraocular muscles intact.  HEENT: Head atraumatic, normocephalic. Oropharynx and nasopharynx clear.  NECK:  Supple, no jugular venous distention. No thyroid enlargement, no tenderness.  LUNGS: Normal work of breathing. Poor air entry bilaterally. Crackles. CARDIOVASCULAR: S1, S2  normal. No murmurs, rubs, or gallops.  ABDOMEN: Soft, nontender, nondistended. Significantly edematous  Bowel sounds present. No organomegaly or mass.  EXTREMITIES: No cyanosis, clubbing.    NEUROLOGIC: Cranial nerves II through XII are intact. No focal Motor or sensory deficits b/l.   PSYCHIATRIC: The patient is alert and oriented x 3.  SKIN: No obvious rash, lesion, or ulcer. Sacral edema   Generalized anasarca.  LABORATORY PANEL:   CBC  Recent Labs Lab 12/02/14 0445  WBC 4.7  HGB 8.3*  HCT 26.8*  PLT 208   ------------------------------------------------------------------------------------------------------------------  Chemistries   Recent Labs Lab 12/02/14 0445  NA 135  K 4.0  CL 95*  CO2 33*  GLUCOSE 138*  BUN 64*  CREATININE 1.92*  CALCIUM 7.8*    ASSESSMENT AND PLAN:   81 year old female with a history of chronic systolic heart failure, DJD with functional quadriplegia, type 2 diabetes and hypertension who presented with elevation in her creatinine and edema.  * Diarrhea 2/2 laxatives D/ced laxatives, improved diarrhea C diff antigen is positive but toxin is negative. PCR neg Afebrile. Normal WBC. d/c po vanc and Flagyl as PCR neg, d/w ID  * Acute on chronic systolic heart failure with anasarca: 2/2 CARDIORENAL SYN  Her echocardiogram showed EF of 20-25% with hypokinesis of several areas.   Apreciate cardio input. Cardio is recommending palliative care, appreciate their recommendations Monitor input and output. Diuretics d/ced as  her renal fx was getting worse   * Chronic respiratory failure is stable  * Acute renal failure on CKD 4 with oliguria Diuretics d/ced as Cr is worsening D/ced vanc Resumed cautious hydration, cr is better today at  1.9 , baseline is 1.66  * ESBL urinary tract infection Was on Invanz Abx finished for 10 days and stopped  * Anemia of chronic disease Stable No need for transfusion  * CAD status post CABG: Patient is  not on ACE inhibitor/ARB due to hypotension and acute renal failure.   * Atrial fibrillation: heart rate is controlled. Patient will continue on Eliquis and amiodarone.  * Diabetes type 2: Continue Lantus and sliding scale insulin.  * Hypothyroidism   On Levothyroxin  She lives at [facility], plan is to d/c to SNF for rehab and to transit to hospice care at rehab Appreciate  Cardio, nephro and  Palliative care recommendations  Had a family meeting with GD, MS.[name] and other significant family members, aware of the situation and agreeable with the current plan of care  Poor prognosis due to multiple comorbidities including ejection fraction of only 20%. Now has acute renal failure. Likely cardiorenal syndrome. Patient's CODE STATUS has been changed to DO NOT RESUSCITATE by palliative care  She will likely need hospice services at discharge. CODE STATUS: DNR  TOTAL TIME TAKING CARE OF THIS PATIENT: 35 minutes.    Ramonita Lab M.D on 12/02/2014 at 5:53 PM  Between 7am to 6pm - Pager - [phone] After 6pm go to [email or web address] - password [email or web address] [facility]  [facility]  Office  [phone]  CC: Primary care physician; Fidel Levy, MD

## 2014-12-02 NOTE — Progress Notes (Signed)
Subjective:  Pt seen at bedside. Cr slowly improving. Currently down to 1.9.    Objective:  Vital signs in last 24 hours:  Temp:  [97.1 F (36.2 C)-97.9 F (36.6 C)] 97.8 F (36.6 C) (10/15 1134) Pulse Rate:  [63-77] 63 (10/15 1134) Resp:  [14] 14 (10/15 1134) BP: (118-141)/(55-71) 125/55 mmHg (10/15 1134) SpO2:  [98 %-99 %] 99 % (10/15 1134) Weight:  [83.66 kg (184 lb 7 oz)] 83.66 kg (184 lb 7 oz) (10/15 0500)  Weight change: 0.108 kg (3.8 oz) Filed Weights   11/30/14 0638 12/01/14 0430 12/02/14 0500  Weight: 81.92 kg (180 lb 9.6 oz) 83.553 kg (184 lb 3.2 oz) 83.66 kg (184 lb 7 oz)    Intake/Output:    Intake/Output Summary (Last 24 hours) at 12/02/14 1511 Last data filed at 12/02/14 1200  Gross per 24 hour  Intake 1195.83 ml  Output    650 ml  Net 545.83 ml     Physical Exam: General:  elderly, frail, NAD  HEENT  moist mucous membranes, anicteric sclera   Neck  supple   Pulm/lungs  mild basilar crackles, normal effort  CVS/Heart  S1S2 no rubs  Abdomen:   soft, nontender, nondistended   Extremities: 1+ dependent edema   Neurologic:  alert, oriented, speech normal   Skin:  no acute rashes, skin breakdown over shins covered     GU  Foley in place     Basic Metabolic Panel:   Recent Labs Lab 11/28/14 0525 11/29/14 0921 11/30/14 0406 12/01/14 0857 12/02/14 0445  NA 135 133* 134* 135 135  K 4.8 4.7 4.4 4.6 4.0  CL 90* 92* 91* 95* 95*  CO2 34* 35* 33* 34* 33*  GLUCOSE 145* 195* 198* 154* 138*  BUN 70* 71* 71* 68* 64*  CREATININE 2.12* 2.35* 2.39* 2.03* 1.92*  CALCIUM 8.2* 8.1* 8.0* 7.9* 7.8*     CBC:  Recent Labs Lab 11/28/14 0525 11/30/14 0406 12/02/14 0445  WBC 4.7 5.3 4.7  NEUTROABS  --  3.6  --   HGB 9.0* 9.0* 8.3*  HCT 29.7* 29.9* 26.8*  MCV 81.2 82.1 82.1  PLT 238 225 208      Microbiology:  Recent Results (from the past 720 hour(s))  Blood Culture (routine x 2)     Status: None   Collection Time: 11/09/14  2:34 PM  Result  Value Ref Range Status   Specimen Description BLOOD RIGHT ARM  Final   Special Requests BOTTLES DRAWN AEROBIC AND ANAEROBIC 3CC  Final   Culture NO GROWTH 5 DAYS  Final   Report Status 11/14/2014 FINAL  Final  Urine culture     Status: None   Collection Time: 11/09/14  2:34 PM  Result Value Ref Range Status   Specimen Description URINE, CLEAN CATCH  Final   Special Requests NONE  Final   Culture   Final    >=100,000 COLONIES/mL ESCHERICHIA COLI ESBL-EXTENDED SPECTRUM BETA LACTAMASE-THE ORGANISM IS RESISTANT TO PENICILLINS, CEPHALOSPORINS AND AZTREONAM ACCORDING TO CLSI M100-S15 VOL.25 N01 JAN 2005. CRITICAL RESULT CALLED TO, READ BACK BY AND VERIFIED WITH: DANIEL JACOBS,RN 11/11/2014 0909 JRS.    Report Status 11/11/2014 FINAL  Final   Organism ID, Bacteria ESCHERICHIA COLI  Final      Susceptibility   Escherichia coli - MIC*    AMPICILLIN >=32 RESISTANT Resistant     CEFAZOLIN >=64 RESISTANT Resistant     CEFTRIAXONE >=64 RESISTANT Resistant     CIPROFLOXACIN >=4 RESISTANT Resistant  GENTAMICIN <=1 SENSITIVE Sensitive     IMIPENEM <=0.25 SENSITIVE Sensitive     NITROFURANTOIN <=16 SENSITIVE Sensitive     TRIMETH/SULFA >=320 RESISTANT Resistant     Extended ESBL POSITIVE Resistant     PIP/TAZO Value in next row Sensitive      SENSITIVE8    LEVOFLOXACIN Value in next row Resistant      RESISTANT>=8    * >=100,000 COLONIES/mL ESCHERICHIA COLI  MRSA PCR Screening     Status: None   Collection Time: 11/09/14  6:24 PM  Result Value Ref Range Status   MRSA by PCR NEGATIVE NEGATIVE Final    Comment:        The GeneXpert MRSA Assay (FDA approved for NASAL specimens only), is one component of a comprehensive MRSA colonization surveillance program. It is not intended to diagnose MRSA infection nor to guide or monitor treatment for MRSA infections.   MRSA PCR Screening     Status: None   Collection Time: 11/21/14  2:04 PM  Result Value Ref Range Status   MRSA by PCR  NEGATIVE NEGATIVE Final    Comment:        The GeneXpert MRSA Assay (FDA approved for NASAL specimens only), is one component of a comprehensive MRSA colonization surveillance program. It is not intended to diagnose MRSA infection nor to guide or monitor treatment for MRSA infections.   C difficile quick scan w PCR reflex     Status: Abnormal   Collection Time: 11/24/14 11:01 PM  Result Value Ref Range Status   C Diff antigen POSITIVE (A) NEGATIVE Final   C Diff toxin NEGATIVE NEGATIVE Final   C Diff interpretation   Final    Negative for toxigenic C. difficile. Toxin gene and active toxin production not detected. May be a nontoxigenic strain of C. difficile bacteria present, lacking the ability to produce toxin.  Clostridium Difficile by PCR     Status: None   Collection Time: 11/24/14 11:01 PM  Result Value Ref Range Status   Toxigenic C Difficile by pcr NEGATIVE NEGATIVE Final  Stool culture     Status: None   Collection Time: 11/27/14  9:00 PM  Result Value Ref Range Status   Specimen Description STOOL  Final   Special Requests NONE  Final   Culture   Final    NO SALMONELLA OR SHIGELLA ISOLATED No Pathogenic E. coli detected NO CAMPYLOBACTER DETECTED    Report Status 11/30/2014 FINAL  Final    Coagulation Studies: No results for input(s): LABPROT, INR in the last 72 hours.  Urinalysis: No results for input(s): COLORURINE, LABSPEC, PHURINE, GLUCOSEU, HGBUR, BILIRUBINUR, KETONESUR, PROTEINUR, UROBILINOGEN, NITRITE, LEUKOCYTESUR in the last 72 hours.  Invalid input(s): APPERANCEUR    Imaging: No results found.   Medications:   . sodium chloride 50 mL/hr at 12/02/14 0052   . acidophilus  1 capsule Oral BID  . amiodarone  200 mg Oral Daily  . antiseptic oral rinse  7 mL Mouth Rinse BID  . apixaban  2.5 mg Oral BID  . atorvastatin  40 mg Oral q1800  . gabapentin  300 mg Oral QHS  . insulin aspart  0-5 Units Subcutaneous QHS  . insulin aspart  0-9 Units  Subcutaneous TID WC  . insulin detemir  4 Units Subcutaneous QHS  . levothyroxine  100 mcg Oral QAC breakfast  . nystatin cream   Topical BID  . sodium chloride  10 mL Intravenous Q12H  . sodium chloride  3 mL  Intravenous Q12H   acetaminophen **OR** acetaminophen, albuterol, HYDROcodone-acetaminophen, loperamide, nitroGLYCERIN, ondansetron **OR** ondansetron (ZOFRAN) IV, polyethylene glycol, promethazine, simethicone, zolpidem  Assessment/ Plan:  81 y.o. female with a PMHX of chronic systolic congestive heart failure, type 2 diabetes, hypertension, atrial fibrillation, DJD, COPD, coronary disease/CABG 1988, catheter 2015, recurrent UTIs, chronic kidney disease, who was [facility] for evaluation of elevated creatinine above her baseline and increased swelling.   1. Acute renal failure on chronic kidney disease stage 4 . Patient's baseline creatinine is 1.66/GFR of 28. CKD is likely secondary to atherosclerosis and hypertension.  Acute renal failure from acute cardiorenal syndrome. Had with dobutamine, dopamine, albumin and furosemide previously - diuretics remain on hold, Cr continues to improve, currently down to 1.9.  As before will continue cautious hydration watching for worsening heart failure.  Pt coming closer to her baseline Cr.   2. Hypertension and Anasarca/edema. From systolic congestive heart failure acute exacerbation. - as before diuretics on hold as renal function worsened when she was on them.  Still has considerable edema.  For now continuing cautious hydration as above.    LOS: 23 Cloe Sockwell 10/15/20163:11 PM

## 2014-12-03 LAB — GLUCOSE, CAPILLARY
GLUCOSE-CAPILLARY: 184 mg/dL — AB (ref 65–99)
GLUCOSE-CAPILLARY: 187 mg/dL — AB (ref 65–99)
Glucose-Capillary: 125 mg/dL — ABNORMAL HIGH (ref 65–99)
Glucose-Capillary: 121 mg/dL — ABNORMAL HIGH (ref 65–99)

## 2014-12-03 LAB — BASIC METABOLIC PANEL
Anion gap: 4 — ABNORMAL LOW (ref 5–15)
BUN: 66 mg/dL — AB (ref 6–20)
CALCIUM: 7.9 mg/dL — AB (ref 8.9–10.3)
CO2: 34 mmol/L — AB (ref 22–32)
Chloride: 98 mmol/L — ABNORMAL LOW (ref 101–111)
Creatinine, Ser: 1.67 mg/dL — ABNORMAL HIGH (ref 0.44–1.00)
GFR calc Af Amer: 32 mL/min — ABNORMAL LOW (ref >60)
GFR, EST NON AFRICAN AMERICAN: 28 mL/min — AB (ref >60)
GLUCOSE: 150 mg/dL — AB (ref 65–99)
Potassium: 4.1 mmol/L (ref 3.5–5.1)
Sodium: 136 mmol/L (ref 135–145)

## 2014-12-03 MED ORDER — ALUM & MAG HYDROXIDE-SIMETH 200-200-20 MG/5ML PO SUSP
15.0000 mL | ORAL | Status: DC | PRN
  Administered 2014-12-03: 15 mL via ORAL
  Filled 2014-12-03: qty 30

## 2014-12-03 NOTE — Progress Notes (Signed)
Nutrition Follow-up  DOCUMENTATION CODES:   Severe malnutrition in context of acute illness/injury  INTERVENTION:  Coordination of care: Spoke with Dr Amado CoeGouru about multiple interventions already provided by Dietitian during admission since 9/22. See RD note on 10/12 for more detail.  Per MD Orvan Falconerampbell note pt does not want feeding tube, calorie count not appropriate as pt has already refused a feeding tube. RD will follow weekly. Will continue snacks as ordered. Please reconsult RD sooner if pt interested in nutrition support   NUTRITION DIAGNOSIS:   Inadequate oral intake related to poor appetite as evidenced by per patient/family report.   GOAL:   Patient will meet greater than or equal to 90% of their needs Not meeting goals  MONITOR:    (Energy Intake, Anthropometrics, Electrolyte and Renal Profile, digestive system)  REASON FOR ASSESSMENT:   Diagnosis    ASSESSMENT:   Current Nutrition: refusing meals, taking sips of water   Gastrointestinal Profile: Last BM: 10/12   Medications: reviewed  Electrolyte/Renal Profile and Glucose Profile:   Recent Labs Lab 12/01/14 0857 12/02/14 0445 12/02/14 1404  NA 135 135 136  K 4.6 4.0 4.1  CL 95* 95* 98*  CO2 34* 33* 34*  BUN 68* 64* 66*  CREATININE 2.03* 1.92* 1.67*  CALCIUM 7.9* 7.8* 7.9*  GLUCOSE 154* 138* 150*      Weight Trend since Admission: Filed Weights   12/01/14 0430 12/02/14 0500 12/03/14 0345  Weight: 184 lb 3.2 oz (83.553 kg) 184 lb 7 oz (83.66 kg) 190 lb 1.6 oz (86.229 kg)      Diet Order:  DIET DYS 3 Room service appropriate?: Yes with Assist; Fluid consistency:: Thin  Skin:    reviewed  Height:   Ht Readings from Last 1 Encounters:  11/27/14 5\' 1"  (1.549 m)    Weight:   Wt Readings from Last 1 Encounters:  12/03/14 190 lb 1.6 oz (86.229 kg)     BMI:  Body mass index is 35.94 kg/(m^2).  Estimated Nutritional Needs:   Kcal:  using IBW of 48kg, BEE: 882kcals, TEE: (IF  1.0-1.2)(AF 1.3) 1146-1490kcals  Protein:  using IBW of 48kg, 48-58g protein (1.0-1.2g/kg)  Fluid:  using IBW of 48kg, (25-2830mL/kg) 1440-162180mL/kg  EDUCATION NEEDS:   No education needs identified at this time  LOW Care Level  [name], RD, LDN [phone] (pager)

## 2014-12-03 NOTE — Progress Notes (Signed)
SUBJECTIVE: Some SOB this am. No pain.   BP 120/55 mmHg  Pulse 62  Temp(Src) 97.6 F (36.4 C) (Oral)  Resp 18  Ht 5\' 1"  (1.549 m)  Wt 190 lb 1.6 oz (86.229 kg)  BMI 35.94 kg/m2  SpO2 97%  Intake/Output Summary (Last 24 hours) at 12/03/14 1030 Last data filed at 12/03/14 0900  Gross per 24 hour  Intake 1142.5 ml  Output    350 ml  Net  792.5 ml    PHYSICAL EXAM General: Well developed, well nourished, in no acute distress. Alert and oriented x 3.  Psych:  Good affect, responds appropriately Neck: + JVD. No masses noted.  Lungs: Clear bilaterally with no wheezes or rhonci noted.  Heart: RRR with no murmurs noted. Abdomen: Bowel sounds are present. Soft, non-tender.  Extremities: 2+ bilateral lower extremity edema.   LABS: Basic Metabolic Panel:  Recent Labs  60/45/4010/15/16 0445 12/02/14 1404  NA 135 136  K 4.0 4.1  CL 95* 98*  CO2 33* 34*  GLUCOSE 138* 150*  BUN 64* 66*  CREATININE 1.92* 1.67*  CALCIUM 7.8* 7.9*   CBC:  Recent Labs  12/02/14 0445  WBC 4.7  HGB 8.3*  HCT 26.8*  MCV 82.1  PLT 208   Current Meds: . acidophilus  1 capsule Oral BID  . amiodarone  200 mg Oral Daily  . antiseptic oral rinse  7 mL Mouth Rinse BID  . apixaban  2.5 mg Oral BID  . atorvastatin  40 mg Oral q1800  . gabapentin  300 mg Oral QHS  . insulin aspart  0-5 Units Subcutaneous QHS  . insulin aspart  0-9 Units Subcutaneous TID WC  . insulin detemir  4 Units Subcutaneous QHS  . levothyroxine  100 mcg Oral QAC breakfast  . nystatin cream   Topical BID  . sodium chloride  10 mL Intravenous Q12H  . sodium chloride  3 mL Intravenous Q12H     ASSESSMENT AND PLAN: 81 y.o. female with h/o CAD s/p CABG s/p PCI on SVGs, ischemic cardiomyopathy/chronic systolic CHF, PAF on Eliquis, PVD, COPD requiring nocturnal oxygen, CKD stage III, IDDM, HLD, and HTN who has been [facility] in mid June and mid July for ESBL UTI, HCAP and acute on chronic systolic CHF; mid  August for ileus; late August for HCAP again; who [facility] with acute on chronic CKD. Cardiology was consulted on 10/4 with acute on chronic systolic CHF in the setting of newly depressed EF of 20-25% by echo on 11/14/2014.  1. Acute on chronic systolic CHF/ICM: Ejection fraction 20-25% by echo 9/27. Previously treated with pressors/inotropes with improvement of her renal function and with diuresis however, following several days of diarrhea and anorexia, her creatinine worsened. She is grossly volume overloaded. Echoocardiogram with only mildly elevated right heart pressures. Not a good candidate for bridging therapy to more advanced cardiac support such as LVAD. Palliative care following.Nephrology following. Given continued volume excess with inability to diurese effectively with diuretics without renal failure, we are at a point where we will have to decide between HD vs palliation. I have discussed this with her and her family today. She is up 12 lbs over last 4 days. I do not think I would move her back to the ICU for inotropic support. It appears to be a reasonable option to consider palliative care/Hospice. She will discuss with Nephrology and her family.   2. Acute on  CKD stage IV/cardiorenal syndrome: Creatine is stable and improved. Nephrology following. Diuretics on hold.  3. PAF: Currently in sinus rhythm. Cont carvedilol, amiodarone, Eliquis 2.5 mg bid (adjusted 2/2 age/creat). CHADSVASc at least 7 (CHF, HTN, DM, vascular disease, age x 2, female)  4. CAD s/p CABG and PCI: No angina. Trop 0.05 on admission. Severe native disease on cardiac catheterization in 05/2014 with patent LIMA  LAD and VG  OM - stable since 12/2013 cath. Given multiple comorbidities, she is not a good candidate for further ischemic workup at this time, especially in light of acute on chronic renal failure. On Eliquis in place of aspirin. Cont beta blocker/statin.   5. DM2: SSI per  IM     MCALHANY,CHRISTOPHER  10/16/201610:30 AM

## 2014-12-03 NOTE — Progress Notes (Signed)
Patient ate a good size late-lunch/early-dinner - food brought in by family. Pt now complains of indigestion and requesting mylanta. Dr. Amado CoeGouru notified. Order for Q4h PRN mylanta.

## 2014-12-03 NOTE — Progress Notes (Signed)
Per Dr. Amado CoeGouru, family and patient have decided to go the route of comfort care. Stop fluids. Keep foley for comfort measures at this time. Let patient eat whatever she wants to eat.

## 2014-12-03 NOTE — Progress Notes (Signed)
Pt. Required pain medication twice during the night related to left hip pain. Pain medication effective each administration, pt. Repositioned frequently during the night.

## 2014-12-03 NOTE — Progress Notes (Signed)
Subjective:  Pt seen at bedside.  Cr now down to 1.67. IVFs stopped. BUN still high at 66. UOP remains very low at 350.    Objective:  Vital signs in last 24 hours:  Temp:  [97.4 F (36.3 C)-97.9 F (36.6 C)] 97.4 F (36.3 C) (10/16 1124) Pulse Rate:  [62-67] 67 (10/16 1134) Resp:  [18] 18 (10/16 1124) BP: (94-120)/(41-64) 114/64 mmHg (10/16 1134) SpO2:  [97 %-99 %] 98 % (10/16 1124) Weight:  [86.229 kg (190 lb 1.6 oz)] 86.229 kg (190 lb 1.6 oz) (10/16 0345)  Weight change: 2.568 kg (5 lb 10.6 oz) Filed Weights   12/01/14 0430 12/02/14 0500 12/03/14 0345  Weight: 83.553 kg (184 lb 3.2 oz) 83.66 kg (184 lb 7 oz) 86.229 kg (190 lb 1.6 oz)    Intake/Output:    Intake/Output Summary (Last 24 hours) at 12/03/14 1407 Last data filed at 12/03/14 0900  Gross per 24 hour  Intake 1142.5 ml  Output    350 ml  Net  792.5 ml     Physical Exam: General:  elderly, frail, NAD  HEENT  moist mucous membranes, anicteric sclera   Neck  supple   Pulm/lungs  mild basilar crackles, normal effort  CVS/Heart  S1S2 no rubs  Abdomen:   soft, nontender, nondistended   Extremities: 1+ dependent edema   Neurologic:  alert, oriented, speech normal   Skin:  no acute rashes, skin breakdown over shins covered     GU  Foley in place     Basic Metabolic Panel:   Recent Labs Lab 11/29/14 0921 11/30/14 0406 12/01/14 0857 12/02/14 0445 12/02/14 1404  NA 133* 134* 135 135 136  K 4.7 4.4 4.6 4.0 4.1  CL 92* 91* 95* 95* 98*  CO2 35* 33* 34* 33* 34*  GLUCOSE 195* 198* 154* 138* 150*  BUN 71* 71* 68* 64* 66*  CREATININE 2.35* 2.39* 2.03* 1.92* 1.67*  CALCIUM 8.1* 8.0* 7.9* 7.8* 7.9*     CBC:  Recent Labs Lab 11/28/14 0525 11/30/14 0406 12/02/14 0445  WBC 4.7 5.3 4.7  NEUTROABS  --  3.6  --   HGB 9.0* 9.0* 8.3*  HCT 29.7* 29.9* 26.8*  MCV 81.2 82.1 82.1  PLT 238 225 208      Microbiology:  Recent Results (from the past 720 hour(s))  Blood Culture (routine x 2)      Status: None   Collection Time: 11/09/14  2:34 PM  Result Value Ref Range Status   Specimen Description BLOOD RIGHT ARM  Final   Special Requests BOTTLES DRAWN AEROBIC AND ANAEROBIC 3CC  Final   Culture NO GROWTH 5 DAYS  Final   Report Status 11/14/2014 FINAL  Final  Urine culture     Status: None   Collection Time: 11/09/14  2:34 PM  Result Value Ref Range Status   Specimen Description URINE, CLEAN CATCH  Final   Special Requests NONE  Final   Culture   Final    >=100,000 COLONIES/mL ESCHERICHIA COLI ESBL-EXTENDED SPECTRUM BETA LACTAMASE-THE ORGANISM IS RESISTANT TO PENICILLINS, CEPHALOSPORINS AND AZTREONAM ACCORDING TO CLSI M100-S15 VOL.25 N01 JAN 2005. CRITICAL RESULT CALLED TO, READ BACK BY AND VERIFIED WITH: DANIEL JACOBS,RN 11/11/2014 0909 JRS.    Report Status 11/11/2014 FINAL  Final   Organism ID, Bacteria ESCHERICHIA COLI  Final      Susceptibility   Escherichia coli - MIC*    AMPICILLIN >=32 RESISTANT Resistant     CEFAZOLIN >=64 RESISTANT Resistant  CEFTRIAXONE >=64 RESISTANT Resistant     CIPROFLOXACIN >=4 RESISTANT Resistant     GENTAMICIN <=1 SENSITIVE Sensitive     IMIPENEM <=0.25 SENSITIVE Sensitive     NITROFURANTOIN <=16 SENSITIVE Sensitive     TRIMETH/SULFA >=320 RESISTANT Resistant     Extended ESBL POSITIVE Resistant     PIP/TAZO Value in next row Sensitive      SENSITIVE8    LEVOFLOXACIN Value in next row Resistant      RESISTANT>=8    * >=100,000 COLONIES/mL ESCHERICHIA COLI  MRSA PCR Screening     Status: None   Collection Time: 11/09/14  6:24 PM  Result Value Ref Range Status   MRSA by PCR NEGATIVE NEGATIVE Final    Comment:        The GeneXpert MRSA Assay (FDA approved for NASAL specimens only), is one component of a comprehensive MRSA colonization surveillance program. It is not intended to diagnose MRSA infection nor to guide or monitor treatment for MRSA infections.   MRSA PCR Screening     Status: None   Collection Time: 11/21/14   2:04 PM  Result Value Ref Range Status   MRSA by PCR NEGATIVE NEGATIVE Final    Comment:        The GeneXpert MRSA Assay (FDA approved for NASAL specimens only), is one component of a comprehensive MRSA colonization surveillance program. It is not intended to diagnose MRSA infection nor to guide or monitor treatment for MRSA infections.   C difficile quick scan w PCR reflex     Status: Abnormal   Collection Time: 11/24/14 11:01 PM  Result Value Ref Range Status   C Diff antigen POSITIVE (A) NEGATIVE Final   C Diff toxin NEGATIVE NEGATIVE Final   C Diff interpretation   Final    Negative for toxigenic C. difficile. Toxin gene and active toxin production not detected. May be a nontoxigenic strain of C. difficile bacteria present, lacking the ability to produce toxin.  Clostridium Difficile by PCR     Status: None   Collection Time: 11/24/14 11:01 PM  Result Value Ref Range Status   Toxigenic C Difficile by pcr NEGATIVE NEGATIVE Final  Stool culture     Status: None   Collection Time: 11/27/14  9:00 PM  Result Value Ref Range Status   Specimen Description STOOL  Final   Special Requests NONE  Final   Culture   Final    NO SALMONELLA OR SHIGELLA ISOLATED No Pathogenic E. coli detected NO CAMPYLOBACTER DETECTED    Report Status 11/30/2014 FINAL  Final    Coagulation Studies: No results for input(s): LABPROT, INR in the last 72 hours.  Urinalysis: No results for input(s): COLORURINE, LABSPEC, PHURINE, GLUCOSEU, HGBUR, BILIRUBINUR, KETONESUR, PROTEINUR, UROBILINOGEN, NITRITE, LEUKOCYTESUR in the last 72 hours.  Invalid input(s): APPERANCEUR    Imaging: No results found.   Medications:     . amiodarone  200 mg Oral Daily  . antiseptic oral rinse  7 mL Mouth Rinse BID  . apixaban  2.5 mg Oral BID  . gabapentin  300 mg Oral QHS  . insulin aspart  0-5 Units Subcutaneous QHS  . insulin aspart  0-9 Units Subcutaneous TID WC  . insulin detemir  4 Units Subcutaneous QHS   . levothyroxine  100 mcg Oral QAC breakfast  . nystatin cream   Topical BID  . sodium chloride  10 mL Intravenous Q12H  . sodium chloride  3 mL Intravenous Q12H   acetaminophen **OR** acetaminophen, albuterol,  HYDROcodone-acetaminophen, nitroGLYCERIN, ondansetron **OR** ondansetron (ZOFRAN) IV, polyethylene glycol, promethazine, simethicone, zolpidem  Assessment/ Plan:  81 y.o. female with a PMHX of chronic systolic congestive heart failure, type 2 diabetes, hypertension, atrial fibrillation, DJD, COPD, coronary disease/CABG 1988, catheter 2015, recurrent UTIs, chronic kidney disease, who was [facility]09[facility] for evaluation of elevated creatinine above her baseline and increased swelling.   1. Acute renal failure on chronic kidney disease stage 4 . Patient's baseline creatinine is 1.66/GFR of 28. CKD is likely secondary to atherosclerosis and hypertension.  Acute renal failure from acute cardiorenal syndrome. Had with dobutamine, dopamine, albumin and furosemide previously - Pt now appears to be at baseline Cr though BUN remains quite high at 66.  Unclear if UOP accurate as there has been some concern regarding leakage.  No urgent indication for HD as Cr trending down.  Reasonable to stop IVFs at the moment however.  Previously when pt was on diuretics renal function worsened.  Will continue to monitor renal function trend and UOP.  Dr. Thedore MinsSingh to be following tomorrow.  2. Hypertension and Anasarca/edema. From systolic congestive heart failure acute exacerbation. - Edema still quite significant.  Have been holding diuretics given previously worsening renal function.  Now off of IVFs.  Could consider LE wraps if ok by cardiology.  Difficult situation to manage overall.  3.  Anemia of CKD:  Hgb currently 8.3, could consider starting pt on epogen if hgb continues to drift down.   LOS: 24 Collis Thede 10/16/20162:07 PM

## 2014-12-03 NOTE — Progress Notes (Signed)
[facility] - [place] at [facility]   PATIENT NAME: [name]    MR#:  [id number]  DATE OF BIRTH:  [birth year]  SUBJECTIVE:    Not feeling well, patient gained 12 pounds in the past 4 days. Very hungry and cannot take clear liquid diet anymore, and prefers eating grits with butter and spaghetti. Wants comfort care measures  REVIEW OF SYSTEMS:    Review of Systems  Constitutional: Positive for malaise/fatigue. Negative for fever and chills.  HENT: Negative for sore throat.   Eyes: Negative for blurred vision.  Respiratory: Negative for cough, hemoptysis, shortness of breath and wheezing.   Cardiovascular: Negative for chest pain, palpitations and leg swelling (Anasarca).  Gastrointestinal: Negative for nausea, vomiting, abdominal pain, diarrhea and blood in stool.  Genitourinary: Negative for dysuria.  Musculoskeletal: Negative for back pain.  Neurological: Negative for dizziness, tremors and headaches.  Endo/Heme/Allergies: Does not bruise/bleed easily.    Tolerating Diet: Yes DRUG ALLERGIES:   Allergies  Allergen Reactions  . Contrast Media [Iodinated Diagnostic Agents] Shortness Of Breath  . Tramadol Nausea Only  . Valium [Diazepam] Other (See Comments)    Reaction:  Elevated heart rate  . Iodine Strong [Iodine] Rash  . Penicillins Rash   VITALS:  Blood pressure 114/64, pulse 67, temperature 97.4 F (36.3 C), temperature source Oral, resp. rate 18, height  (1.549 m), weight 86.229 kg (190 lb 1.6 oz), SpO2 98 %. PHYSICAL EXAMINATION:   Physical Exam   GENERAL:  81 y.o.-year-old patient lying in the bed with no acute distress.  EYES: Pupils equal, round, reactive to light and accommodation. No scleral icterus. Extraocular muscles intact.  HEENT: Head atraumatic, normocephalic. Oropharynx and nasopharynx clear.  NECK:  Supple, no jugular venous distention. No thyroid enlargement, no tenderness.  LUNGS: Normal work of breathing. Poor  air entry bilaterally. Crackles. CARDIOVASCULAR: S1, S2 normal. No murmurs, rubs, or gallops.  ABDOMEN: Soft, nontender, nondistended. Significantly edematous  Bowel sounds present. No organomegaly or mass.  EXTREMITIES: No cyanosis, clubbing.    NEUROLOGIC: Cranial nerves II through XII are intact. No focal Motor or sensory deficits b/l.   PSYCHIATRIC: The patient is alert and oriented x 3.  SKIN: No obvious rash, lesion, or ulcer. Sacral edema   Generalized anasarca.  LABORATORY PANEL:   CBC  Recent Labs Lab 12/02/14 0445  WBC 4.7  HGB 8.3*  HCT 26.8*  PLT 208   ------------------------------------------------------------------------------------------------------------------  Chemistries   Recent Labs Lab 12/02/14 1404  NA 136  K 4.1  CL 98*  CO2 34*  GLUCOSE 150*  BUN 66*  CREATININE 1.67*  CALCIUM 7.9*    ASSESSMENT AND PLAN:   81 year old female with a history of chronic systolic heart failure, DJD with functional quadriplegia, type 2 diabetes and hypertension who presented with elevation in her creatinine and edema.  *Failure to thrive Patient does not like liquid diet, discussed with dietitian, they have tried supplements, Magic cup, mighty shakes and other options with no significant improvement in by mouth intake.  Patient wants comfort care measures, granddaughter her healthcare power of attorney is agreeable with the comfort care measures   * Acute on chronic systolic heart failure with anasarca: 2/2 CARDIORENAL SYN  Her echocardiogram showed EF of 20-25% with hypokinesis of several areas.   Apreciate cardio input. Cardio is recommending palliative care with hospice, appreciate their recommendations Monitor input and output.-Gained 12 pounds in 4 days Diuretics d/ced as  her renal fx was getting worse, now it's  better with creatinine at 1.67, almost at her baseline    * Diarrhea 2/2 laxatives D/ced laxatives, improved diarrhea C diff antigen is  positive but toxin is negative. PCR neg Afebrile. Normal WBC. d/c po vanc and Flagyl as PCR neg, d/w ID    * Chronic respiratory failure is stable  * Acute renal failure on CKD 4 with oliguria Diuretics d/ced as Cr is worsening D/ced vancC Discontinued IV fluids as her creatinine is significantly improved and that her baseline  * ESBL urinary tract infection Was on Invanz Abx finished for 10 days and stopped  * Anemia of chronic disease Stable No need for transfusion  * CAD status post CABG: Patient is not on ACE inhibitor/ARB due to hypotension and acute renal failure.   * Atrial fibrillation: heart rate is controlled. Patient will continue on Eliquis and amiodarone.  * Diabetes type 2: Continue Lantus and sliding scale insulin.  * Hypothyroidism   On Levothyroxin  Appreciate  Cardio, nephro and  Palliative care recommendations  Had a family meeting with GD, MS.[name] over phone and other significant family members in the room, aware of the situation and agreeable with comfort care measures .  Poor prognosis due to multiple comorbidities including ejection fraction of only 20%. Now has  cardiorenal syndrome. Patient's CODE STATUS has been changed to DO NOT RESUSCITATE by palliative care  ;as per my discussion today with the patient and granddaughter her healthcare power of attorney will provide comfort care measures  CODE STATUS: DNR with comfort care  TOTAL TIME TAKING CARE OF THIS PATIENT: 35 minutes.    Ramonita LabGouru, Allah Reason M.D on 12/03/2014 at 11:57 AM  Between 7am to 6pm - Pager - [phone] After 6pm go to [email or web address] - password [email or web address] [facility]  [place] [facility]  Office  [phone]  CC: Primary care physician; Fidel LevyJames Hawkins Jr, MD

## 2014-12-03 NOTE — Progress Notes (Signed)
Pt. Had 150 output for night shift. She only had sips of water with meds, she refused all other offers. She also has NS running at 6650ml/hr. Bladder scanner showed a range between 30-50 of urine in bladder over three scans. ABD is soft, non-tender and no distention noted.

## 2014-12-04 LAB — GLUCOSE, CAPILLARY
GLUCOSE-CAPILLARY: 138 mg/dL — AB (ref 65–99)
Glucose-Capillary: 125 mg/dL — ABNORMAL HIGH (ref 65–99)

## 2014-12-04 MED ORDER — TORSEMIDE 20 MG PO TABS: 20.0000 mg | ORAL_TABLET | Freq: Two times a day (BID) | ORAL | Status: DC

## 2014-12-04 MED ORDER — TORSEMIDE 20 MG PO TABS: 40.0000 mg | ORAL_TABLET | Freq: Two times a day (BID) | ORAL | Status: AC

## 2014-12-04 MED ORDER — POTASSIUM CHLORIDE ER 10 MEQ PO TBCR: 10.0000 meq | EXTENDED_RELEASE_TABLET | Freq: Two times a day (BID) | ORAL | Status: AC

## 2014-12-04 MED ORDER — ALUM & MAG HYDROXIDE-SIMETH 200-200-20 MG/5ML PO SUSP: 15.0000 mL | ORAL | Status: AC | PRN

## 2014-12-04 NOTE — Progress Notes (Signed)
Patients POA and granddaughter called to unit to ask MD to give her an update & [facility] sort [facility] . RN will pass along to MD on rounds.

## 2014-12-04 NOTE — Care Management Important Message (Signed)
Important Message  Patient Details  Name: Sara DemarkShirley A Louk MRN: [id number] Date of Birth: 1931/05/19   Medicare Important Message Given:  Yes-third notification given.    Eber HongGreene, Admire Bunnell R, RN 12/04/2014, 2:05 PM

## 2014-12-04 NOTE — Discharge Instructions (Signed)
Activity as tolerated as recommended by physical therapy Diet-low salt, 2000-calorie diabetic diet, restrict fluids to 1800 cc per day To be seen by primary care physician at the facility in 3 days Palliative care consult in a week

## 2014-12-04 NOTE — Progress Notes (Signed)
Pt. Is discharging to [facility] with rehab. Report has been called to [facility]ennifer [facility]. EMS has been notified. PICC line and PIV discontinued per policy. Patient assessment unchanged from this morning. Gaspar BiddingJennifer Phillips POA/granddaughter updated as well.

## 2014-12-04 NOTE — Progress Notes (Signed)
Spoke with [name], [facility] at [phone], to notify of non-emergent EMS transport.  Auth notification reference given as [id number].   Service date range good from 12/04/14 - 03/04/15.   Gap exception requested to determine if services can be considered at an in-network level.

## 2014-12-04 NOTE — Progress Notes (Signed)
Pt. Rested throughout the night. She did have c/o pain which was relieved with pain medication. She did ask for her Ambien around 0130 while being turned, which was given and then she rested peacefully the rest of the night.

## 2014-12-04 NOTE — Progress Notes (Signed)
EMS left a bag of patient belongings in the room, grabnddaughter called because these belongings contain her glasses. Belongings are thoroughly bagged, labeled and granddaughter or another family member is to come pick it up tomorrow.

## 2014-12-04 NOTE — Clinical Social Work Note (Signed)
Patient is to return today to [facility]. Discharge summary sent. Patient's nurse to inform patient's daughter of discharge. Nurse to call report and patient to transport via EMS. York SpanielMonica Farin Buhman MSW,LCSW [phone]

## 2014-12-04 NOTE — Discharge Summary (Addendum)
[facility] - [place] at [facility]   PATIENT NAME: [name]    MR#:  [id number]  DATE OF BIRTH:  1931/05/26  DATE OF ADMISSION:  11/09/2014 ADMITTING PHYSICIAN: Enedina Finner, MD  DATE OF DISCHARGE: 12/04/2014 PRIMARY CARE PHYSICIAN: Fidel Levy, MD    ADMISSION DIAGNOSIS:  Acute on chronic combined systolic and diastolic congestive heart failure (HCC) [I50.43] Renal failure (ARF), acute on chronic (HCC) [N17.9, N18.9]  DISCHARGE DIAGNOSIS:  Principal Problem:   Acute on chronic combined systolic and diastolic congestive heart failure (HCC) Active Problems:   Paroxysmal atrial fibrillation (HCC)   Essential hypertension   DM2 (diabetes mellitus, type 2) (HCC)   Hyperlipidemia   COPD (chronic obstructive pulmonary disease) (HCC)   Elevated troponin   Renal failure (ARF), acute on chronic (HCC)   Coronary artery disease involving coronary bypass graft of native heart without angina pectoris   Bilateral leg edema   Cardiomyopathy, ischemic   Protein-calorie malnutrition, severe (HCC)   Hypoxia   SECONDARY DIAGNOSIS:   Past Medical History  Diagnosis Date  . DM2 (diabetes mellitus, type 2)     onset age 45 insulin dependent  . Hyperlipidemia   . COPD (chronic obstructive pulmonary disease)   . Peripheral vascular disease   . CAD (coronary artery disease)     a. MI 1988 s/p CABG in 1988, multiple PCI on SVGs; b. cath 2012: occluded native coronary arteries, patent LIMA to LAD (distal LAD dz), patent SVG-OM & occluded SVG-RCA which filled via left to right collats; c. cath 12/2013 patent LIMA-LAD & SVG-O. known occluded VG-RCA w/ L-R collats, no change since cath 2012  . Chronic systolic CHF (congestive heart failure)     a. 03/2012: EF 40-45%, mild lateral wall HK, mod inf HK, mod post wall HK, mild LVH, mild MR/TR   . Paroxysmal atrial fibrillation     a. CHADSVASc 7: yearly risk of CVA 9.6%;. b. on Eliquis 2.5 mg bid (age, SCr,  borderline wt 62.9 Kg)   . HTN (hypertension)   . Yeast infection   . UTI (urinary tract infection)     Recurrent ESBL E.coli UTI  . Atherosclerosis of native artery of left lower extremity   . Myocardial infarction   . CKD (chronic kidney disease) stage 3, GFR 30-59 ml/min   . Hypoxia     on nocturnal o2    HOSPITAL COURSE:  81 year old female with a history of chronic systolic heart failure, DJD with functional quadriplegia, type 2 diabetes and hypertension who presented with elevation in her creatinine and edema.  *Failure to thrive Patient does not like liquid diet, discussed with dietitian, they have tried supplements, Magic cup, mighty shakes and other options with no significant improvement in by mouth intake. Patient wants regular diet but agreeable with low-salt, diabetic and fluid restriction to 1800 mL. , Sara her healthcare power of attorney is agreeable with the current plan to discharge her to rehabilitation with DO NOT RESUSCITATE   * Acute on chronic systolic heart failure with anasarca: 2/2 CARDIORENAL SYN Her echocardiogram showed EF of 20-25% with hypokinesis of several areas.  Apreciate cardio input. Cardio is recommending palliative care with hospice, appreciate their recommendations Monitor input and output.-Gained 12 pounds in 4 days Diuretics d/ced as her renal fx was getting worse, now it's better with creatinine at 1.67, almost at her baseline. Resume diuretics as small dose    * Diarrhea 2/2 laxatives D/ced laxatives, improved diarrhea C diff antigen is  positive but toxin is negative. PCR neg Afebrile. Normal WBC. d/c po vanc and Flagyl as PCR neg, d/w ID    * Chronic respiratory failure is stable  * Acute renal failure on CKD 4 with oliguria Diuretics d/ced as Cr is worsening D/ced vancC Discontinued IV fluids as her creatinine is significantly improved and that her baseline  * ESBL urinary tract infection Was on Invanz Abx  finished for 10 days and stopped  * Anemia of chronic disease Stable No need for transfusion  * CAD status post CABG: Patient is not on ACE inhibitor/ARB due to hypotension and acute renal failure.   * Atrial fibrillation: heart rate is controlled. Patient will continue on Eliquis and amiodarone.  * Diabetes type 2: Continue levemir and  sliding scale insulin. Discontinue Lantus in the L4 by mouth intake  * Hypothyroidism  On Levothyroxin  Appreciate Cardio, nephro and Palliative care recommendations  Had a family meeting with GD, MS.[name] over phone and the patient ,aware of the situation , poor prognosis. Agreable with DNR, rehab   DISCHARGE CONDITIONS:   Fair but gaurded  CONSULTS OBTAINED:  Treatment Team:  Mosetta PigeonHarmeet Singh, MD Antonieta Ibaimothy J Gollan, MD Ramonita LabAruna Shana Zavaleta, MD   PROCEDURES none  DRUG ALLERGIES:   Allergies  Allergen Reactions  . Contrast Media [Iodinated Diagnostic Agents] Shortness Of Breath  . Tramadol Nausea Only  . Valium [Diazepam] Other (See Comments)    Reaction:  Elevated heart rate  . Iodine Strong [Iodine] Rash  . Penicillins Rash    DISCHARGE MEDICATIONS:   Current Discharge Medication List    START taking these medications   Details  acetaminophen (TYLENOL) 325 MG tablet Take 2 tablets (650 mg total) by mouth every 6 (six) hours as needed for mild pain (or Fever >/= 101).    alum & mag hydroxide-simeth (MAALOX/MYLANTA) 200-200-20 MG/5ML suspension Take 15 mLs by mouth every 4 (four) hours as needed for indigestion or heartburn. Qty: 355 mL, Refills: 0    insulin aspart (NOVOLOG) 100 UNIT/ML injection Inject 0-5 Units into the skin at bedtime. Qty: 10 mL, Refills: 11    insulin detemir (LEVEMIR) 100 UNIT/ML injection Inject 0.04 mLs (4 Units total) into the skin at bedtime. Qty: 10 mL, Refills: 11    nystatin cream (MYCOSTATIN) Apply topically 2 (two) times daily. Qty: 30 g, Refills: 0      CONTINUE these medications which have  CHANGED   Details  gabapentin (NEURONTIN) 300 MG capsule Take 1 capsule (300 mg total) by mouth at bedtime. Qty: 30 capsule, Refills: 0    HYDROcodone-acetaminophen (NORCO/VICODIN) 5-325 MG tablet Take 1 tablet by mouth every 4 (four) hours as needed for moderate pain. Qty: 30 tablet, Refills: 0    potassium chloride (K-DUR) 10 MEQ tablet Take 1 tablet (10 mEq total) by mouth 2 (two) times daily. Qty: 30 tablet, Refills: 0    torsemide (DEMADEX) 20 MG tablet Take 2 tablets (40 mg total) by mouth 2 (two) times daily. Qty: 180 tablet, Refills: 3      CONTINUE these medications which have NOT CHANGED   Details  acidophilus (RISAQUAD) CAPS capsule Take 1 capsule by mouth 2 (two) times daily. Qty: 60 capsule, Refills: 4    albuterol (PROVENTIL) (2.5 MG/3ML) 0.083% nebulizer solution Take 3 mLs (2.5 mg total) by nebulization every 6 (six) hours as needed for wheezing or shortness of breath. Qty: 150 mL, Refills: 1   Associated Diagnoses: Chronic obstructive pulmonary disease, unspecified COPD, unspecified chronic bronchitis type  amiodarone (PACERONE) 200 MG tablet Take 1 tablet (200 mg total) by mouth daily. Qty: 90 tablet, Refills: 3    apixaban (ELIQUIS) 2.5 MG TABS tablet Take 1 tablet (2.5 mg total) by mouth 2 (two) times daily. Qty: 60 tablet, Refills: 6    levothyroxine (SYNTHROID, LEVOTHROID) 100 MCG tablet Take 100 mcg by mouth daily.     nitroGLYCERIN (NITROSTAT) 0.4 MG SL tablet Place 1 tablet (0.4 mg total) under the tongue every 5 (five) minutes as needed for chest pain. Qty: 30 tablet, Refills: 0    polyethylene glycol (MIRALAX / GLYCOLAX) packet Take 17 g by mouth daily as needed for mild constipation or moderate constipation. Qty: 14 each, Refills: 0    pravastatin (PRAVACHOL) 40 MG tablet Take 1 tablet (40 mg total) by mouth daily. Qty: 90 tablet, Refills: 3      STOP taking these medications     benazepril (LOTENSIN) 20 MG tablet      carvedilol (COREG)  3.125 MG tablet      Cranberry 1000 MG CAPS      docusate sodium (COLACE) 100 MG capsule      insulin lispro (HUMALOG) 100 UNIT/ML injection      isosorbide mononitrate (IMDUR) 60 MG 24 hr tablet      LANTUS SOLOSTAR 100 UNIT/ML Solostar Pen      lubiprostone (AMITIZA) 24 MCG capsule          DISCHARGE INSTRUCTIONS:    Activity as tolerated as recommended by physical therapy Diet-low salt, 2000-calorie diabetic diet, restrict fluids to 1800 cc per day To be seen by primary care physician at the facility in 3 days Palliative care consult in a week Continue Foley catheter, can be discontinued with voiding trial at nursing home  DIET:  {Diet-low salt, 2000-calorie diabetic diet, restrict fluids to 1800 cc per day DISCHARGE CONDITION:  gaurded  ACTIVITY:  Activity as tolerated as recommended by physical therapy  OXYGEN:  Home Oxygen: Yes.     Oxygen Delivery: 2 liters/min via Patient connected to nasal cannula oxygen  DISCHARGE LOCATION:  snf   If you experience worsening of your admission symptoms, develop shortness of breath, life threatening emergency, suicidal or homicidal thoughts you must seek medical attention immediately by calling 911 or calling your MD immediately  if symptoms less severe.  You Must read complete instructions/literature along with all the possible adverse reactions/side effects for all the Medicines you take and that have been prescribed to you. Take any new Medicines after you have completely understood and accpet all the possible adverse reactions/side effects.   Please note  You were cared for by a hospitalist during your hospital stay. If you have any questions about your discharge medications or the care you received while you were in the hospital after you are discharged, you can call the unit and asked to speak with the hospitalist on call if the hospitalist that took care of you is not available. Once you are discharged, your primary  care physician will handle any further medical issues. Please note that NO REFILLS for any discharge medications will be authorized once you are discharged, as it is imperative that you return to your primary care physician (or establish a relationship with a primary care physician if you do not have one) for your aftercare needs so that they can reassess your need for medications and monitor your lab values.      Today  Chief Complaint  Patient presents with  . Urinary Tract  Infection   Patient is refusing feeding tube. Wants to eat regular diet as she is very hungry. After having lengthy discussion with the patient and [name], diet has been changed to low salt, diabetic diet with fluid restrictions. Patient is considering rehabilitation at this time  ROS:  CONSTITUTIONAL: Denies fevers, chills. Reporting fatigue and weakness.  EYES: Denies blurry vision, double vision, eye pain. EARS, NOSE, THROAT: Denies tinnitus, ear pain, hearing loss. RESPIRATORY: Denies cough, wheeze, shortness of breath.  CARDIOVASCULAR: Denies chest pain, palpitations, edema.  GASTROINTESTINAL: Denies nausea, vomiting, diarrhea, abdominal pain. Denies bright red blood per rectum. GENITOURINARY: Denies dysuria, hematuria. ENDOCRINE: Denies nocturia or thyroid problems. HEMATOLOGIC AND LYMPHATIC: Denies easy bruising or bleeding. SKIN: Denies rash or lesion. Reporting swollen legs and stomach with fluid MUSCULOSKELETAL: Denies pain in neck, back, shoulder, knees, hips or arthritic symptoms.  NEUROLOGIC: Denies paralysis, paresthesias.  PSYCHIATRIC: Denies anxiety or depressive symptoms.   VITAL SIGNS:  Blood pressure 113/55, pulse 66, temperature 97.3 F (36.3 C), temperature source Oral, resp. rate 20, height  (1.549 m), weight 88.536 kg (195 lb 3 oz), SpO2 100 %.  I/O:    Intake/Output Summary (Last 24 hours) at 12/04/14 1507 Last data filed at 12/03/14 1700  Gross per 24 hour   Intake      0 ml  Output      0 ml  Net      0 ml    PHYSICAL EXAMINATION:  GENERAL:  81 y.o.-year-old patient lying in the bed with no acute distress.  EYES: Pupils equal, round, reactive to light and accommodation. No scleral icterus. Extraocular muscles intact.  HEENT: Head atraumatic, normocephalic. Oropharynx and nasopharynx clear.  NECK:  Supple, no jugular venous distention. No thyroid enlargement, no tenderness.  LUNGS: Normal breath sounds bilaterally, no wheezing, rales,rhonchi or crepitation. No use of accessory muscles of respiration.  CARDIOVASCULAR: S1, S2 normal. No murmurs, rubs, or gallops.  ABDOMEN: Soft, abdominal edema with anasarca ,non-tender Bowel sounds present. No organomegaly or mass.  EXTREMITIES: Significant pitting edema, no cyanosis, or clubbing.  NEUROLOGIC: Cranial nerves II through XII are intact. Muscle strength decreased diffusely.. Sensation intact. Gait not checked.  PSYCHIATRIC: The patient is alert and oriented x 3.  SKIN: No obvious rash, lesion, or ulcer.   DATA REVIEW:   CBC  Recent Labs Lab 12/02/14 0445  WBC 4.7  HGB 8.3*  HCT 26.8*  PLT 208    Chemistries   Recent Labs Lab 12/02/14 1404  NA 136  K 4.1  CL 98*  CO2 34*  GLUCOSE 150*  BUN 66*  CREATININE 1.67*  CALCIUM 7.9*    Cardiac Enzymes No results for input(s): TROPONINI in the last 168 hours.  Microbiology Results  Results for orders placed or performed during the hospital encounter of 11/09/14  Blood Culture (routine x 2)     Status: None   Collection Time: 11/09/14  2:34 PM  Result Value Ref Range Status   Specimen Description BLOOD RIGHT ARM  Final   Special Requests BOTTLES DRAWN AEROBIC AND ANAEROBIC 3CC  Final   Culture NO GROWTH 5 DAYS  Final   Report Status 11/14/2014 FINAL  Final  Urine culture     Status: None   Collection Time: 11/09/14  2:34 PM  Result Value Ref Range Status   Specimen Description URINE, CLEAN CATCH  Final   Special Requests  NONE  Final   Culture   Final    >=100,000 COLONIES/mL ESCHERICHIA COLI  ESBL-EXTENDED SPECTRUM BETA LACTAMASE-THE ORGANISM IS RESISTANT TO PENICILLINS, CEPHALOSPORINS AND AZTREONAM ACCORDING TO CLSI M100-S15 VOL.25 N01 JAN 2005. CRITICAL RESULT CALLED TO, READ BACK BY AND VERIFIED WITH: DANIEL JACOBS,RN 11/11/2014 0909 JRS.    Report Status 11/11/2014 FINAL  Final   Organism ID, Bacteria ESCHERICHIA COLI  Final      Susceptibility   Escherichia coli - MIC*    AMPICILLIN >=32 RESISTANT Resistant     CEFAZOLIN >=64 RESISTANT Resistant     CEFTRIAXONE >=64 RESISTANT Resistant     CIPROFLOXACIN >=4 RESISTANT Resistant     GENTAMICIN <=1 SENSITIVE Sensitive     IMIPENEM <=0.25 SENSITIVE Sensitive     NITROFURANTOIN <=16 SENSITIVE Sensitive     TRIMETH/SULFA >=320 RESISTANT Resistant     Extended ESBL POSITIVE Resistant     PIP/TAZO Value in next row Sensitive      SENSITIVE8    LEVOFLOXACIN Value in next row Resistant      RESISTANT>=8    * >=100,000 COLONIES/mL ESCHERICHIA COLI  MRSA PCR Screening     Status: None   Collection Time: 11/09/14  6:24 PM  Result Value Ref Range Status   MRSA by PCR NEGATIVE NEGATIVE Final    Comment:        The GeneXpert MRSA Assay (FDA approved for NASAL specimens only), is one component of a comprehensive MRSA colonization surveillance program. It is not intended to diagnose MRSA infection nor to guide or monitor treatment for MRSA infections.   MRSA PCR Screening     Status: None   Collection Time: 11/21/14  2:04 PM  Result Value Ref Range Status   MRSA by PCR NEGATIVE NEGATIVE Final    Comment:        The GeneXpert MRSA Assay (FDA approved for NASAL specimens only), is one component of a comprehensive MRSA colonization surveillance program. It is not intended to diagnose MRSA infection nor to guide or monitor treatment for MRSA infections.   C difficile quick scan w PCR reflex     Status: Abnormal   Collection Time: 11/24/14 11:01  PM  Result Value Ref Range Status   C Diff antigen POSITIVE (A) NEGATIVE Final   C Diff toxin NEGATIVE NEGATIVE Final   C Diff interpretation   Final    Negative for toxigenic C. difficile. Toxin gene and active toxin production not detected. May be a nontoxigenic strain of C. difficile bacteria present, lacking the ability to produce toxin.  Clostridium Difficile by PCR     Status: None   Collection Time: 11/24/14 11:01 PM  Result Value Ref Range Status   Toxigenic C Difficile by pcr NEGATIVE NEGATIVE Final  Stool culture     Status: None   Collection Time: 11/27/14  9:00 PM  Result Value Ref Range Status   Specimen Description STOOL  Final   Special Requests NONE  Final   Culture   Final    NO SALMONELLA OR SHIGELLA ISOLATED No Pathogenic E. coli detected NO CAMPYLOBACTER DETECTED    Report Status 11/30/2014 FINAL  Final    RADIOLOGY:  No results found.  EKG:   Orders placed or performed during the hospital encounter of 11/09/14  . ED EKG 12-Lead  . ED EKG 12-Lead  . EKG 12-Lead  . EKG 12-Lead      Management plans discussed with the patient, family and they are in agreement.  CODE STATUS:     Code Status Orders        Start  Ordered   12/04/14 1340  Do not attempt resuscitation (DNR)   Continuous    Question Answer Comment  In the event of cardiac or respiratory ARREST Do not call a "code blue"   In the event of cardiac or respiratory ARREST Do not perform Intubation, CPR, defibrillation or ACLS   In the event of cardiac or respiratory ARREST Use medication by any route, position, wound care, and other measures to relive pain and suffering. May use oxygen, suction and manual treatment of airway obstruction as needed for comfort.   Comments refusing dialysis and feeding tube      12/04/14 1339    Advance Directive Documentation        Most Recent Value   Type of Advance Directive  Healthcare Power of Attorney, Living will   Pre-existing out of facility  DNR order (yellow form or pink MOST form)     "MOST" Form in Place?        TOTAL TIME TAKING CARE OF THIS PATIENT: 45  minutes.    @[facility] @  on 12/04/2014 at 3:07 PM  Between 7am to 6pm - Pager - [phone]  After 6pm go to [email or web address] - password [email or web address] [facility]  [facility] [place] [facility]  Office  [phone]  CC: Primary care physician; Fidel Levy, MD

## 2014-12-04 NOTE — Progress Notes (Signed)
SUBJECTIVE: Some SOB this am. No pain. Being discharged to rehab.   BP 113/55 mmHg  Pulse 66  Temp(Src) 97.3 F (36.3 C) (Oral)  Resp 20  Ht  (1.549 m)  Wt 195 lb 3 oz (88.536 kg)  BMI 36.90 kg/m2  SpO2 100%  Intake/Output Summary (Last 24 hours) at 12/04/14 1517 Last data filed at 12/03/14 1700  Gross per 24 hour  Intake      0 ml  Output      0 ml  Net      0 ml    PHYSICAL EXAM General: Well developed, well nourished, in no acute distress. Alert and oriented x 3.  Psych:  Good affect, responds appropriately Neck: + JVD. No masses noted.  Lungs: Clear bilaterally with no wheezes or rhonci noted.  Heart: RRR with no murmurs noted. Abdomen: Bowel sounds are present. Soft, non-tender. Positive ascites.  Extremities: 2+ bilateral lower extremity edema.   LABS: Basic Metabolic Panel:  Recent Labs  16/10/96 0445 12/02/14 1404  NA 135 136  K 4.0 4.1  CL 95* 98*  CO2 33* 34*  GLUCOSE 138* 150*  BUN 64* 66*  CREATININE 1.92* 1.67*  CALCIUM 7.8* 7.9*   CBC:  Recent Labs  12/02/14 0445  WBC 4.7  HGB 8.3*  HCT 26.8*  MCV 82.1  PLT 208   Current Meds: . amiodarone  200 mg Oral Daily  . antiseptic oral rinse  7 mL Mouth Rinse BID  . apixaban  2.5 mg Oral BID  . gabapentin  300 mg Oral QHS  . insulin aspart  0-5 Units Subcutaneous QHS  . insulin aspart  0-9 Units Subcutaneous TID WC  . insulin detemir  4 Units Subcutaneous QHS  . levothyroxine  100 mcg Oral QAC breakfast  . nystatin cream   Topical BID  . sodium chloride  10 mL Intravenous Q12H  . sodium chloride  3 mL Intravenous Q12H     ASSESSMENT AND PLAN: 81 y.o. female with h/o CAD s/p CABG s/p PCI on SVGs, ischemic cardiomyopathy/chronic systolic CHF, PAF on Eliquis, PVD, COPD requiring nocturnal oxygen, CKD stage III, IDDM, HLD, and HTN who has been admitted recently to [facility] in mid June and mid July for ESBL UTI, HCAP and acute on chronic systolic CHF; mid August for ileus; late August  for HCAP again; who presented to [facility] on 9/22 with from [facility] with acute on chronic CKD. Cardiology was consulted on 10/4 with acute on chronic systolic CHF in the setting of newly depressed EF of 20-25% by echo on 11/14/2014.  1. Acute on chronic systolic CHF/ICM: Ejection fraction 20-25% by echo 9/27. Previously treated with pressors/inotropes with improvement of her renal function and with diuresis however, following several days of diarrhea and anorexia, her creatinine worsened. She is grossly volume overloaded. Echoocardiogram with only mildly elevated right heart pressures. Not a good candidate for bridging therapy to more advanced cardiac support such as LVAD. Palliative care following. Recommend Torsemide 40 mg bid for discharge.  Will arrange for follow up in 2 weeks.    2. Acute on CKD stage IV/cardiorenal syndrome: Creatine is stable and improved. Renal function is back to baseline.   3. PAF: Currently in sinus rhythm. Cont carvedilol, amiodarone, Eliquis 2.5 mg bid (adjusted 2/2 age/creat). CHADSVASc at least 7 (CHF, HTN, DM, vascular disease, age x 2, female)  4. CAD s/p CABG and PCI: No angina. Trop 0.05 on admission. Severe native disease on cardiac catheterization  in 05/2014 with patent LIMA  LAD and VG  OM - stable since 12/2013 cath. Given multiple comorbidities, she is not a good candidate for further ischemic workup at this time, especially in light of acute on chronic renal failure. On Eliquis in place of aspirin. Cont beta blocker/statin.   5. DM2: SSI per IM     Lorine BearsMuhammad Arida  10/17/20163:17 PM

## 2014-12-05 ENCOUNTER — Telehealth: Payer: Self-pay

## 2014-12-05 NOTE — Telephone Encounter (Signed)
[facility]. Per discharge note, pt in rehab. Left message w/granddaughter, Victorino DikeJennifer, [place] regarding upcoming appt w/Arida Left CB number if questions regarding discharge instructions or medications.

## 2014-12-08 LAB — GLUCOSE, CAPILLARY: Glucose-Capillary: 106 mg/dL — ABNORMAL HIGH (ref 65–99)

## 2014-12-11 ENCOUNTER — Ambulatory Visit: Payer: Medicare Other | Admitting: Family

## 2014-12-12 ENCOUNTER — Encounter: Payer: Medicare Other | Admitting: Cardiovascular Disease

## 2014-12-19 DEATH — deceased

## 2014-12-20 ENCOUNTER — Other Ambulatory Visit: Payer: Self-pay | Admitting: Cardiovascular Disease

## 2014-12-26 ENCOUNTER — Other Ambulatory Visit: Payer: Medicare Other

## 2015-01-02 ENCOUNTER — Encounter: Payer: Self-pay | Admitting: Family Medicine

## 2015-01-04 ENCOUNTER — Telehealth: Payer: Self-pay | Admitting: Urology

## 2015-01-04 NOTE — Telephone Encounter (Signed)
-----   Message from Vanna ScotlandAshley Brandon, MD sent at 10/02/2014  1:12 PM EDT ----- Looks like Dr. Sampson GoonFitzgerald would like us to get a catheterized UCx in 3-4 weeks.  Can you please arrange for this nurse visit for this?  Vanna ScotlandAshley Brandon, MD

## 2015-01-04 NOTE — Telephone Encounter (Signed)
Patient is deceased  just FYI for you  . I was just going thru the phone pool messages and wanted you to be aware.  Thanks, Marcelino DusterMichelle

## 2015-01-26 LAB — CULTURE, BLOOD (ROUTINE X 2): Culture: NO GROWTH
